# Patient Record
Sex: Female | Born: 1940 | Race: White | Hispanic: No | Marital: Married | Smoking: Former smoker
Health system: Southern US, Community
[De-identification: ages and names within clinical notes are randomized; demographics above are authoritative.]

## PROBLEM LIST (undated history)

## (undated) DIAGNOSIS — I5189 Other ill-defined heart diseases: Secondary | ICD-10-CM

## (undated) DIAGNOSIS — K2970 Gastritis, unspecified, without bleeding: Secondary | ICD-10-CM

## (undated) DIAGNOSIS — F32A Depression, unspecified: Secondary | ICD-10-CM

## (undated) DIAGNOSIS — K559 Vascular disorder of intestine, unspecified: Secondary | ICD-10-CM

## (undated) DIAGNOSIS — F419 Anxiety disorder, unspecified: Secondary | ICD-10-CM

## (undated) DIAGNOSIS — K5790 Diverticulosis of intestine, part unspecified, without perforation or abscess without bleeding: Secondary | ICD-10-CM

## (undated) DIAGNOSIS — R51 Headache: Secondary | ICD-10-CM

## (undated) DIAGNOSIS — E039 Hypothyroidism, unspecified: Secondary | ICD-10-CM

## (undated) DIAGNOSIS — E559 Vitamin D deficiency, unspecified: Secondary | ICD-10-CM

## (undated) DIAGNOSIS — I10 Essential (primary) hypertension: Secondary | ICD-10-CM

## (undated) DIAGNOSIS — F329 Major depressive disorder, single episode, unspecified: Secondary | ICD-10-CM

## (undated) DIAGNOSIS — K219 Gastro-esophageal reflux disease without esophagitis: Secondary | ICD-10-CM

## (undated) DIAGNOSIS — G8929 Other chronic pain: Secondary | ICD-10-CM

## (undated) DIAGNOSIS — J45909 Unspecified asthma, uncomplicated: Secondary | ICD-10-CM

## (undated) DIAGNOSIS — I499 Cardiac arrhythmia, unspecified: Secondary | ICD-10-CM

## (undated) DIAGNOSIS — E785 Hyperlipidemia, unspecified: Secondary | ICD-10-CM

## (undated) DIAGNOSIS — R351 Nocturia: Secondary | ICD-10-CM

## (undated) DIAGNOSIS — E669 Obesity, unspecified: Secondary | ICD-10-CM

## (undated) DIAGNOSIS — M797 Fibromyalgia: Secondary | ICD-10-CM

## (undated) DIAGNOSIS — M549 Dorsalgia, unspecified: Secondary | ICD-10-CM

## (undated) DIAGNOSIS — M1990 Unspecified osteoarthritis, unspecified site: Secondary | ICD-10-CM

## (undated) DIAGNOSIS — M2550 Pain in unspecified joint: Secondary | ICD-10-CM

## (undated) HISTORY — DX: Anxiety disorder, unspecified: F41.9

## (undated) HISTORY — PX: CHOLECYSTECTOMY: SHX55

## (undated) HISTORY — PX: APPENDECTOMY: SHX54

## (undated) HISTORY — PX: FOOT SURGERY: SHX648

## (undated) HISTORY — PX: CORONARY ANGIOPLASTY: SHX604

## (undated) HISTORY — PX: OTHER SURGICAL HISTORY: SHX169

## (undated) HISTORY — PX: OOPHORECTOMY: SHX86

## (undated) HISTORY — PX: COLONOSCOPY: SHX174

## (undated) HISTORY — PX: ABDOMINAL HYSTERECTOMY: SHX81

## (undated) HISTORY — PX: BACK SURGERY: SHX140

---

## 1999-05-18 ENCOUNTER — Ambulatory Visit (HOSPITAL_COMMUNITY): Admission: RE | Admit: 1999-05-18 | Discharge: 1999-05-18 | Payer: Self-pay | Admitting: Neurosurgery

## 1999-05-18 ENCOUNTER — Encounter: Payer: Self-pay | Admitting: Neurosurgery

## 1999-06-20 ENCOUNTER — Encounter: Payer: Self-pay | Admitting: Neurosurgery

## 1999-06-20 ENCOUNTER — Ambulatory Visit (HOSPITAL_COMMUNITY): Admission: RE | Admit: 1999-06-20 | Discharge: 1999-06-20 | Payer: Self-pay

## 1999-07-11 ENCOUNTER — Encounter: Payer: Self-pay | Admitting: Neurosurgery

## 1999-07-11 ENCOUNTER — Ambulatory Visit (HOSPITAL_COMMUNITY): Admission: RE | Admit: 1999-07-11 | Discharge: 1999-07-12 | Payer: Self-pay | Admitting: Neurosurgery

## 2000-06-10 ENCOUNTER — Ambulatory Visit (HOSPITAL_COMMUNITY): Admission: RE | Admit: 2000-06-10 | Discharge: 2000-06-10 | Payer: Self-pay | Admitting: Internal Medicine

## 2000-06-20 ENCOUNTER — Ambulatory Visit (HOSPITAL_COMMUNITY): Admission: RE | Admit: 2000-06-20 | Discharge: 2000-06-20 | Payer: Self-pay | Admitting: Internal Medicine

## 2000-06-20 ENCOUNTER — Encounter (INDEPENDENT_AMBULATORY_CARE_PROVIDER_SITE_OTHER): Payer: Self-pay | Admitting: Internal Medicine

## 2000-06-25 ENCOUNTER — Ambulatory Visit (HOSPITAL_COMMUNITY): Admission: RE | Admit: 2000-06-25 | Discharge: 2000-06-25 | Payer: Self-pay | Admitting: Otolaryngology

## 2000-06-25 ENCOUNTER — Encounter: Payer: Self-pay | Admitting: Otolaryngology

## 2001-03-18 ENCOUNTER — Encounter: Payer: Self-pay | Admitting: Family Medicine

## 2001-03-18 ENCOUNTER — Inpatient Hospital Stay (HOSPITAL_COMMUNITY): Admission: AD | Admit: 2001-03-18 | Discharge: 2001-03-20 | Payer: Self-pay | Admitting: Family Medicine

## 2001-04-02 ENCOUNTER — Encounter: Payer: Self-pay | Admitting: Cardiology

## 2001-04-02 ENCOUNTER — Inpatient Hospital Stay (HOSPITAL_COMMUNITY): Admission: EM | Admit: 2001-04-02 | Discharge: 2001-04-03 | Payer: Self-pay

## 2001-12-07 ENCOUNTER — Ambulatory Visit (HOSPITAL_COMMUNITY): Admission: RE | Admit: 2001-12-07 | Discharge: 2001-12-07 | Payer: Self-pay | Admitting: *Deleted

## 2002-01-17 ENCOUNTER — Encounter: Payer: Self-pay | Admitting: *Deleted

## 2002-01-17 ENCOUNTER — Emergency Department (HOSPITAL_COMMUNITY): Admission: EM | Admit: 2002-01-17 | Discharge: 2002-01-17 | Payer: Self-pay | Admitting: *Deleted

## 2002-04-12 ENCOUNTER — Encounter: Payer: Self-pay | Admitting: Family Medicine

## 2002-04-12 ENCOUNTER — Ambulatory Visit (HOSPITAL_COMMUNITY): Admission: RE | Admit: 2002-04-12 | Discharge: 2002-04-12 | Payer: Self-pay | Admitting: Family Medicine

## 2004-02-14 ENCOUNTER — Ambulatory Visit: Payer: Self-pay | Admitting: *Deleted

## 2004-09-24 ENCOUNTER — Inpatient Hospital Stay (HOSPITAL_COMMUNITY): Admission: EM | Admit: 2004-09-24 | Discharge: 2004-09-25 | Payer: Self-pay | Admitting: *Deleted

## 2005-01-08 ENCOUNTER — Emergency Department (HOSPITAL_COMMUNITY): Admission: EM | Admit: 2005-01-08 | Discharge: 2005-01-08 | Payer: Self-pay | Admitting: Emergency Medicine

## 2005-04-08 ENCOUNTER — Inpatient Hospital Stay (HOSPITAL_COMMUNITY): Admission: EM | Admit: 2005-04-08 | Discharge: 2005-04-15 | Payer: Self-pay | Admitting: Emergency Medicine

## 2005-04-10 ENCOUNTER — Ambulatory Visit: Payer: Self-pay | Admitting: Gastroenterology

## 2005-04-20 ENCOUNTER — Emergency Department (HOSPITAL_COMMUNITY): Admission: EM | Admit: 2005-04-20 | Discharge: 2005-04-20 | Payer: Self-pay | Admitting: Emergency Medicine

## 2005-05-01 ENCOUNTER — Ambulatory Visit: Payer: Self-pay | Admitting: Internal Medicine

## 2005-07-02 ENCOUNTER — Ambulatory Visit: Payer: Self-pay | Admitting: *Deleted

## 2005-11-01 ENCOUNTER — Ambulatory Visit (HOSPITAL_COMMUNITY): Admission: RE | Admit: 2005-11-01 | Discharge: 2005-11-01 | Payer: Self-pay

## 2006-01-03 ENCOUNTER — Ambulatory Visit (HOSPITAL_COMMUNITY): Admission: RE | Admit: 2006-01-03 | Discharge: 2006-01-03 | Payer: Self-pay | Admitting: Neurological Surgery

## 2006-01-27 ENCOUNTER — Inpatient Hospital Stay (HOSPITAL_COMMUNITY): Admission: RE | Admit: 2006-01-27 | Discharge: 2006-01-31 | Payer: Self-pay | Admitting: Neurological Surgery

## 2006-06-11 ENCOUNTER — Ambulatory Visit (HOSPITAL_COMMUNITY): Admission: RE | Admit: 2006-06-11 | Discharge: 2006-06-11 | Payer: Self-pay | Admitting: Neurological Surgery

## 2006-07-08 ENCOUNTER — Ambulatory Visit: Payer: Self-pay | Admitting: *Deleted

## 2007-03-10 ENCOUNTER — Ambulatory Visit: Payer: Self-pay | Admitting: Gastroenterology

## 2007-08-11 ENCOUNTER — Ambulatory Visit: Payer: Self-pay | Admitting: *Deleted

## 2008-02-02 ENCOUNTER — Ambulatory Visit: Payer: Self-pay | Admitting: Family Medicine

## 2008-05-26 ENCOUNTER — Ambulatory Visit: Payer: Self-pay | Admitting: Family Medicine

## 2008-08-12 ENCOUNTER — Ambulatory Visit: Payer: Self-pay | Admitting: Family Medicine

## 2008-09-30 ENCOUNTER — Other Ambulatory Visit: Payer: Self-pay | Admitting: Internal Medicine

## 2008-10-03 ENCOUNTER — Ambulatory Visit: Payer: Self-pay | Admitting: Internal Medicine

## 2009-06-16 ENCOUNTER — Emergency Department: Payer: Self-pay | Admitting: Emergency Medicine

## 2009-08-15 ENCOUNTER — Ambulatory Visit: Payer: Self-pay | Admitting: Family Medicine

## 2009-08-18 ENCOUNTER — Ambulatory Visit: Payer: Self-pay | Admitting: Family Medicine

## 2010-01-29 ENCOUNTER — Ambulatory Visit: Payer: Self-pay | Admitting: Pain Medicine

## 2010-02-13 ENCOUNTER — Ambulatory Visit: Payer: Self-pay | Admitting: Pain Medicine

## 2010-03-07 ENCOUNTER — Ambulatory Visit: Payer: Self-pay | Admitting: Pain Medicine

## 2010-03-17 ENCOUNTER — Encounter: Payer: Self-pay | Admitting: Family Medicine

## 2010-03-18 ENCOUNTER — Encounter: Payer: Self-pay | Admitting: Physical Medicine and Rehabilitation

## 2010-04-12 ENCOUNTER — Other Ambulatory Visit: Payer: Self-pay | Admitting: Internal Medicine

## 2010-04-13 ENCOUNTER — Ambulatory Visit: Payer: Self-pay | Admitting: Internal Medicine

## 2010-04-13 HISTORY — PX: CARDIAC CATHETERIZATION: SHX172

## 2010-05-21 ENCOUNTER — Emergency Department (HOSPITAL_COMMUNITY): Payer: Medicare PPO

## 2010-05-21 ENCOUNTER — Emergency Department (HOSPITAL_COMMUNITY)
Admission: EM | Admit: 2010-05-21 | Discharge: 2010-05-21 | Disposition: A | Discharge status: Home / Self Care | Payer: Medicare PPO | Attending: Emergency Medicine | Admitting: Emergency Medicine

## 2010-05-21 DIAGNOSIS — I10 Essential (primary) hypertension: Secondary | ICD-10-CM | POA: Insufficient documentation

## 2010-05-21 DIAGNOSIS — R0602 Shortness of breath: Secondary | ICD-10-CM | POA: Insufficient documentation

## 2010-05-21 DIAGNOSIS — R5381 Other malaise: Secondary | ICD-10-CM | POA: Insufficient documentation

## 2010-05-21 DIAGNOSIS — R51 Headache: Secondary | ICD-10-CM | POA: Insufficient documentation

## 2010-05-21 DIAGNOSIS — E039 Hypothyroidism, unspecified: Secondary | ICD-10-CM | POA: Insufficient documentation

## 2010-05-21 DIAGNOSIS — R079 Chest pain, unspecified: Secondary | ICD-10-CM | POA: Insufficient documentation

## 2010-05-21 DIAGNOSIS — K219 Gastro-esophageal reflux disease without esophagitis: Secondary | ICD-10-CM | POA: Insufficient documentation

## 2010-05-21 DIAGNOSIS — Z79899 Other long term (current) drug therapy: Secondary | ICD-10-CM | POA: Insufficient documentation

## 2010-05-21 DIAGNOSIS — R5383 Other fatigue: Secondary | ICD-10-CM | POA: Insufficient documentation

## 2010-05-21 LAB — COMPREHENSIVE METABOLIC PANEL
ALT: 17 U/L (ref 0–35)
CO2: 25 mEq/L (ref 19–32)
GFR calc Af Amer: >60 mL/min (ref >60)
Potassium: 3.9 mEq/L (ref 3.5–5.1)
Total Bilirubin: 0.7 mg/dL (ref 0.3–1.2)

## 2010-05-21 LAB — DIFFERENTIAL
Basophils Absolute: 0 10*3/uL (ref 0.0–0.1)
Basophils Relative: 0 % (ref 0–1)
Eosinophils Absolute: 0.1 10*3/uL (ref 0.0–0.7)
Lymphocytes Relative: 44 % (ref 12–46)
Lymphs Abs: 2.3 10*3/uL (ref 0.7–4.0)
Monocytes Absolute: 0.5 10*3/uL (ref 0.1–1.0)
Monocytes Relative: 10 % (ref 3–12)

## 2010-05-21 LAB — POCT CARDIAC MARKERS: CKMB, poc: <1 ng/mL — ABNORMAL LOW (ref 1.0–8.0)

## 2010-05-21 LAB — CBC
HCT: 37.8 % (ref 36.0–46.0)
MCHC: 33.3 g/dL (ref 30.0–36.0)
RDW: 14.2 % (ref 11.5–15.5)

## 2010-05-21 LAB — D-DIMER, QUANTITATIVE: D-Dimer, Quant: 0.63 ug/mL-FEU — ABNORMAL HIGH (ref 0.00–0.48)

## 2010-05-21 MED ORDER — IOHEXOL 350 MG/ML SOLN
100.0000 mL | Freq: Once | INTRAVENOUS | Status: AC | PRN
  Administered 2010-05-21: 100 mL via INTRAVENOUS

## 2010-05-21 MED ORDER — IOHEXOL 350 MG/ML SOLN
100.0000 mL | Freq: Once | INTRAVENOUS | Status: AC | PRN
  Administered 2010-05-21: 20:00:00 via INTRAVENOUS

## 2010-08-21 ENCOUNTER — Ambulatory Visit: Payer: Self-pay | Admitting: Family Medicine

## 2010-12-25 ENCOUNTER — Emergency Department: Payer: Self-pay | Admitting: Emergency Medicine

## 2011-04-05 ENCOUNTER — Ambulatory Visit: Payer: Self-pay | Admitting: Gastroenterology

## 2011-05-02 ENCOUNTER — Emergency Department (HOSPITAL_COMMUNITY): Payer: Medicare Other

## 2011-05-02 ENCOUNTER — Encounter (HOSPITAL_COMMUNITY): Payer: Self-pay | Admitting: *Deleted

## 2011-05-02 ENCOUNTER — Observation Stay (HOSPITAL_COMMUNITY)
Admission: EM | Admit: 2011-05-02 | Discharge: 2011-05-04 | Disposition: A | Discharge status: Home / Self Care | Payer: Medicare Other | Attending: Internal Medicine | Admitting: Internal Medicine

## 2011-05-02 DIAGNOSIS — K5289 Other specified noninfective gastroenteritis and colitis: Principal | ICD-10-CM | POA: Insufficient documentation

## 2011-05-02 DIAGNOSIS — E860 Dehydration: Secondary | ICD-10-CM | POA: Insufficient documentation

## 2011-05-02 DIAGNOSIS — R109 Unspecified abdominal pain: Secondary | ICD-10-CM

## 2011-05-02 DIAGNOSIS — R197 Diarrhea, unspecified: Secondary | ICD-10-CM | POA: Diagnosis present

## 2011-05-02 DIAGNOSIS — R531 Weakness: Secondary | ICD-10-CM | POA: Diagnosis present

## 2011-05-02 DIAGNOSIS — E039 Hypothyroidism, unspecified: Secondary | ICD-10-CM | POA: Insufficient documentation

## 2011-05-02 DIAGNOSIS — K219 Gastro-esophageal reflux disease without esophagitis: Secondary | ICD-10-CM | POA: Insufficient documentation

## 2011-05-02 DIAGNOSIS — M6281 Muscle weakness (generalized): Secondary | ICD-10-CM | POA: Insufficient documentation

## 2011-05-02 DIAGNOSIS — I10 Essential (primary) hypertension: Secondary | ICD-10-CM | POA: Insufficient documentation

## 2011-05-02 HISTORY — DX: Other chronic pain: G89.29

## 2011-05-02 HISTORY — DX: Essential (primary) hypertension: I10

## 2011-05-02 HISTORY — DX: Dorsalgia, unspecified: M54.9

## 2011-05-02 HISTORY — DX: Gastro-esophageal reflux disease without esophagitis: K21.9

## 2011-05-02 LAB — CBC
HCT: 45 % (ref 36.0–46.0)
Hemoglobin: 15.3 g/dL — ABNORMAL HIGH (ref 12.0–15.0)
MCHC: 34 g/dL (ref 30.0–36.0)
RBC: 4.95 MIL/uL (ref 3.87–5.11)

## 2011-05-02 LAB — URINALYSIS, ROUTINE W REFLEX MICROSCOPIC
Glucose, UA: NEGATIVE mg/dL
Hgb urine dipstick: NEGATIVE
Leukocytes, UA: NEGATIVE
Specific Gravity, Urine: 1.005 (ref 1.005–1.030)
pH: 5.5 (ref 5.0–8.0)

## 2011-05-02 LAB — COMPREHENSIVE METABOLIC PANEL
ALT: 20 U/L (ref 0–35)
Calcium: 10.7 mg/dL — ABNORMAL HIGH (ref 8.4–10.5)
Creatinine, Ser: 1.1 mg/dL (ref 0.50–1.10)
GFR calc Af Amer: 58 mL/min — ABNORMAL LOW (ref >90)
Glucose, Bld: 116 mg/dL — ABNORMAL HIGH (ref 70–99)
Sodium: 137 mEq/L (ref 135–145)
Total Protein: 7.8 g/dL (ref 6.0–8.3)

## 2011-05-02 LAB — LIPASE, BLOOD: Lipase: 41 U/L (ref 11–59)

## 2011-05-02 MED ORDER — ONDANSETRON HCL 4 MG PO TABS: 4.0000 mg | ORAL_TABLET | Freq: Four times a day (QID) | ORAL | Status: DC | PRN

## 2011-05-02 MED ORDER — IOHEXOL 300 MG/ML  SOLN
100.0000 mL | Freq: Once | INTRAMUSCULAR | Status: AC | PRN
  Administered 2011-05-02: 100 mL via INTRAVENOUS

## 2011-05-02 MED ORDER — ENOXAPARIN SODIUM 40 MG/0.4ML ~~LOC~~ SOLN
40.0000 mg | SUBCUTANEOUS | Status: DC
  Administered 2011-05-02: 40 mg via SUBCUTANEOUS
  Filled 2011-05-02: qty 0.4

## 2011-05-02 MED ORDER — SODIUM CHLORIDE 0.9 % IV SOLN
INTRAVENOUS | Status: DC
  Administered 2011-05-02 – 2011-05-03 (×2): via INTRAVENOUS

## 2011-05-02 MED ORDER — ACETAMINOPHEN 325 MG PO TABS
650.0000 mg | ORAL_TABLET | Freq: Four times a day (QID) | ORAL | Status: DC | PRN
  Administered 2011-05-02 – 2011-05-03 (×2): 650 mg via ORAL
  Filled 2011-05-02 (×2): qty 2

## 2011-05-02 MED ORDER — PANTOPRAZOLE SODIUM 40 MG PO TBEC: 80.0000 mg | DELAYED_RELEASE_TABLET | Freq: Every day | ORAL | Status: DC

## 2011-05-02 MED ORDER — LISINOPRIL 10 MG PO TABS
40.0000 mg | ORAL_TABLET | Freq: Every day | ORAL | Status: DC
  Administered 2011-05-03 – 2011-05-04 (×2): 40 mg via ORAL
  Filled 2011-05-02: qty 1
  Filled 2011-05-02: qty 4

## 2011-05-02 MED ORDER — ONDANSETRON HCL 4 MG/2ML IJ SOLN
4.0000 mg | Freq: Once | INTRAMUSCULAR | Status: AC
  Administered 2011-05-02: 4 mg via INTRAVENOUS
  Filled 2011-05-02: qty 2

## 2011-05-02 MED ORDER — SODIUM CHLORIDE 0.9 % IV BOLUS (SEPSIS)
500.0000 mL | Freq: Once | INTRAVENOUS | Status: AC
  Administered 2011-05-02: 1000 mL via INTRAVENOUS

## 2011-05-02 MED ORDER — PANTOPRAZOLE SODIUM 40 MG PO TBEC
80.0000 mg | DELAYED_RELEASE_TABLET | Freq: Every day | ORAL | Status: DC
  Administered 2011-05-03 – 2011-05-04 (×2): 80 mg via ORAL
  Filled 2011-05-02: qty 2
  Filled 2011-05-02: qty 1

## 2011-05-02 MED ORDER — LEVOTHYROXINE SODIUM 88 MCG PO TABS
88.0000 ug | ORAL_TABLET | Freq: Every day | ORAL | Status: DC
  Administered 2011-05-03 – 2011-05-04 (×2): 88 ug via ORAL
  Filled 2011-05-02 (×4): qty 1

## 2011-05-02 MED ORDER — ONDANSETRON HCL 4 MG/2ML IJ SOLN
4.0000 mg | Freq: Four times a day (QID) | INTRAMUSCULAR | Status: DC | PRN
  Administered 2011-05-03: 4 mg via INTRAVENOUS
  Filled 2011-05-02: qty 2

## 2011-05-02 MED ORDER — TRAZODONE HCL 50 MG PO TABS
25.0000 mg | ORAL_TABLET | Freq: Every day | ORAL | Status: DC
  Administered 2011-05-02 – 2011-05-03 (×2): 25 mg via ORAL
  Filled 2011-05-02 (×2): qty 1

## 2011-05-02 MED ORDER — SODIUM CHLORIDE 0.9 % IV SOLN: INTRAVENOUS | Status: DC

## 2011-05-02 MED ORDER — TRAZODONE HCL 50 MG PO TABS: 25.0000 mg | ORAL_TABLET | Freq: Every day | ORAL | Status: DC

## 2011-05-02 MED ORDER — ACETAMINOPHEN 650 MG RE SUPP: 650.0000 mg | Freq: Four times a day (QID) | RECTAL | Status: DC | PRN

## 2011-05-02 MED ORDER — HYDROCODONE-ACETAMINOPHEN 5-325 MG PO TABS: 1.0000 | ORAL_TABLET | ORAL | Status: DC | PRN

## 2011-05-02 NOTE — ED Provider Notes (Addendum)
History    Scribed for Shelda Jakes, MD, the patient was seen in room APA10/APA10. This chart was scribed by Katha Cabal.   CSN: [id number]  Arrival date & time 05/02/11  0902   First MD Initiated Contact with Patient 05/02/11 [phone]      Chief Complaint  Patient presents with  . Abdominal Pain    (Consider location/radiation/quality/duration/timing/severity/associated sxs/prior treatment) HPI  Pt was seen at 9:55 AM   Madison Jennings is a 71 y.o. female who presents to the Emergency Department complaining of persistent right sided abdomianl pain with associated nausea and diarrhea for past 10 days.  Pain described as sharp. Pain radiates to left abdomen at times but does not radiate to back.  Pain was constant for a week and now pain is intermittent.  No pain currently.   Patient had 3-4 episodes of diarrhea yesterday which was not bloody.  Patient reports shortness of breath over past week,  headache, sinus pressure and cough.  Symptoms are not associated with vomiting, sore throat, dysuria, rash, new back pain or leg swelling.  Patient with history of chronic back pain, GERD, hypertension and thyroid disease.     PCP Dr. Mayford Knife [facility])    Past Medical History  Diagnosis Date  . Chronic back pain   . Hypertension   . Thyroid disease   . GERD (gastroesophageal reflux disease)     Past Surgical History  Procedure Date  . Back surgery   . Abdominal hysterectomy   . Cholecystectomy     No family history on file.  History  Substance Use Topics  . Smoking status: Never Smoker   . Smokeless tobacco: Not on file  . Alcohol Use: No    OB History    Grav Para Term Preterm Abortions TAB SAB Ect Mult Living                  Review of Systems  All other systems reviewed and are negative.   Remaining review of systems negative except as noted in the HPI.   Allergies  Review of patient's allergies indicates no known allergies.  Home Medications    Current Outpatient Rx  Name Route Sig Dispense Refill  . ACETAMINOPHEN 500 MG PO TABS Oral Take 500 mg by mouth every 6 (six) hours as needed. Pain    . ESOMEPRAZOLE MAGNESIUM 40 MG PO CPDR Oral Take 40 mg by mouth daily before breakfast.    . LEVOTHYROXINE SODIUM 88 MCG PO TABS Oral Take 88 mcg by mouth daily.    Marland Kitchen LISINOPRIL 40 MG PO TABS Oral Take 40 mg by mouth daily.      BP 122/58  Pulse 77  Temp(Src) 97.4 F (36.3 C) (Oral)  Resp 20  Ht 5\' 5"  (1.651 m)  Wt 230 lb (104.327 kg)  BMI 38.27 kg/m2  SpO2 98%  Physical Exam  Nursing note and vitals reviewed. Constitutional: She is oriented to person, place, and time. She appears well-developed.  HENT:  Head: Normocephalic and atraumatic.  Mouth/Throat: Mucous membranes are normal. Mucous membranes are not dry.  Eyes: Conjunctivae and EOM are normal.  Neck: Neck supple.  Cardiovascular: Normal rate and regular rhythm.   No murmur heard. Pulmonary/Chest: Effort normal and breath sounds normal. No respiratory distress.  Abdominal: Soft. Bowel sounds are normal. There is no tenderness. There is no rebound and no guarding.  Musculoskeletal: Normal range of motion. She exhibits no edema.       No  peripheral edema   Neurological: She is alert and oriented to person, place, and time. No cranial nerve deficit or sensory deficit.  Skin: Skin is warm and dry. No rash noted.  Psychiatric: Her behavior is normal.    ED Course  Procedures (including critical care time)   DIAGNOSTIC STUDIES: Oxygen Saturation is 97% on room air, normal by my interpretation.     COORDINATION OF CARE:   9:59 AM  Physical exam complete.  Will CT abdomen and order labs. 10:15 AM  Zofran and IV fluids.   2:36 PM  Consult with Hospitalist regarding patient case.  Plan to admit patient.  Family and patient agree with plan.      LABS / RADIOLOGY:   Labs Reviewed  COMPREHENSIVE METABOLIC PANEL - Abnormal; Notable for the following:    Glucose,  Bld 116 (*)    Calcium 10.7 (*)    Alkaline Phosphatase 128 (*)    GFR calc non Af Amer 50 (*)    GFR calc Af Amer 58 (*)    All other components within normal limits  CBC - Abnormal; Notable for the following:    WBC 10.8 (*)    Hemoglobin 15.3 (*)    All other components within normal limits  URINALYSIS, ROUTINE W REFLEX MICROSCOPIC  LIPASE, BLOOD   Results for orders placed during the hospital encounter of 05/02/11  COMPREHENSIVE METABOLIC PANEL      Component Value Range   Sodium 137  135 - 145 (mEq/L)   Potassium 3.6  3.5 - 5.1 (mEq/L)   Chloride 102  96 - 112 (mEq/L)   CO2 23  19 - 32 (mEq/L)   Glucose, Bld 116 (*) 70 - 99 (mg/dL)   BUN 21  6 - 23 (mg/dL)   Creatinine, Ser 1.61  0.50 - 1.10 (mg/dL)   Calcium 09.6 (*) 8.4 - 10.5 (mg/dL)   Total Protein 7.8  6.0 - 8.3 (g/dL)   Albumin 4.0  3.5 - 5.2 (g/dL)   AST 13  0 - 37 (U/L)   ALT 20  0 - 35 (U/L)   Alkaline Phosphatase 128 (*) 39 - 117 (U/L)   Total Bilirubin 0.5  0.3 - 1.2 (mg/dL)   GFR calc non Af Amer 50 (*) >90 (mL/min)   GFR calc Af Amer 58 (*) >90 (mL/min)  CBC      Component Value Range   WBC 10.8 (*) 4.0 - 10.5 (K/uL)   RBC 4.95  3.87 - 5.11 (MIL/uL)   Hemoglobin 15.3 (*) 12.0 - 15.0 (g/dL)   HCT 04.5  40.9 - 81.1 (%)   MCV 90.9  78.0 - 100.0 (fL)   MCH 30.9  26.0 - 34.0 (pg)   MCHC 34.0  30.0 - 36.0 (g/dL)   RDW 91.4  78.2 - 95.6 (%)   Platelets 242  150 - 400 (K/uL)  URINALYSIS, ROUTINE W REFLEX MICROSCOPIC      Component Value Range   Color, Urine YELLOW  YELLOW    APPearance CLEAR  CLEAR    Specific Gravity, Urine 1.005  1.005 - 1.030    pH 5.5  5.0 - 8.0    Glucose, UA NEGATIVE  NEGATIVE (mg/dL)   Hgb urine dipstick NEGATIVE  NEGATIVE    Bilirubin Urine NEGATIVE  NEGATIVE    Ketones, ur NEGATIVE  NEGATIVE (mg/dL)   Protein, ur NEGATIVE  NEGATIVE (mg/dL)   Urobilinogen, UA 0.2  0.0 - 1.0 (mg/dL)   Nitrite NEGATIVE  NEGATIVE  Leukocytes, UA NEGATIVE  NEGATIVE   LIPASE, BLOOD       Component Value Range   Lipase 41  11 - 59 (U/L)    Ct Abdomen Pelvis W Contrast  05/02/2011  *RADIOLOGY REPORT*  Clinical Data: Abdominal pain, nausea and vomiting.  History of appendectomy and cholecystectomy.  CT ABDOMEN AND PELVIS WITH CONTRAST  Technique:  Multidetector CT imaging of the abdomen and pelvis was performed following the standard protocol during bolus administration of intravenous contrast.  Contrast: OMNIPAQUE IOHEXOL 300 MG/ML IJ SOLN  Comparison: CT abdomen and pelvis dated 04/20/2005.  Findings:  Lung Bases: Dependent atelectasis is noted in the lower lobes of the lungs bilaterally (right greater than left).  Abdomen/Pelvis:  Status post cholecystectomy.  Pneumobilia is again noted (unchanged).  The enhanced appearance of the liver is otherwise normal.  The enhanced appearance of the pancreas, spleen and bilateral adrenal glands is unremarkable.  There is atrophy of the left kidney (similar to prior), and there are multifocal areas of mild parenchymal thinning in the right kidney, likely reflect areas of scarring.  Otherwise, there is no focal right renal lesion.  No ascites or pneumoperitoneum. There are a few isolated minimally dilated loops of small bowel (up to 3.4 cm in diameter) in the left upper quadrant of the abdomen.  However, the remainder of the small bowel is otherwise normal in caliber.  No definite pathologic adenopathy noted within the abdomen or pelvis.  The patient is status post total abdominal hysterectomy and bilateral salpingo- oophorectomy.  Urinary bladder is unremarkable in appearance.  Musculoskeletal: There are no aggressive appearing lytic or blastic lesions noted in the visualized portions of the skeleton.  Status post PLIF from L4-S1 with interbody grafts at L4-L5 and L5-S1.  IMPRESSION: 1.  While there is an isolated mildly dilated loop of small bowel in the left upper quadrant of the abdomen (likely jejunal), this is highly nonspecific and there are no  definitive signs to strongly suggest the presence of small bowel obstruction.  This may simply relate to transit of peristalsis. 2.  No other potentially acute findings in the abdomen or pelvis to account for the patient's symptoms. 3.  Status post cholecystectomy, appendectomy and total abdominal hysterectomy with bilateral salpingo-oophorectomy. 4.  Unchanged pneumobilia, suggestive of prior sphincterotomy in this patient. 5.  Left-sided renal atrophy with areas of scarring in the renal parenchyma bilaterally.  6.  Status post PLIF from L4-S1 with interbody grafts at L4-L5 and L5-S1.  Original Report Authenticated By: Florencia Reasons, M.D.         MDM  Patient with one-week history of bilateral lower quadrant abdominal pain workup in the emergency department without specific findings based on CT mild leukocytosis no significant mitral abnormalities urinalysis not consistent with UTI however patient not feeling well has had abdominal pain some nausea and diarrhea since Sunday occasional episodes of vomiting discussed with hospitalist and they will admit to hobs status and observe on the MedSurg floor. The one dilated loop of small bowel noted this can be watched to see if there is any progression. Patient definitely feeling poorly checking thyroid levels to be considered.        MEDICATIONS GIVEN IN THE E.D. Scheduled Meds:    . ondansetron  4 mg Intravenous Once  . sodium chloride  500 mL Intravenous Once   Continuous Infusions:    . sodium chloride         IMPRESSION: 1. Abdominal  pain, other specified  site        I personally performed the services described in this documentation, which was scribed in my presence. The recorded information has been reviewed and considered.        Shelda Jakes, MD 05/02/11 1442  Shelda Jakes, MD 05/05/11 2035

## 2011-05-02 NOTE — ED Notes (Signed)
Pt given warm blanket.

## 2011-05-02 NOTE — ED Notes (Signed)
Assisted with pt ambulation to the restroom at this time pt states she feels very weak with ambulation no pain or dizziness noted. Pt also states that she is freezing at this time blanket applied also urine obtained and sent to lab pt's v/s also taken . Domnic Vantol

## 2011-05-02 NOTE — ED Notes (Signed)
Pt c/o abdominal pain, nausea and diarrhea since 04/21/11. Pt states that the pain is intermittent and she has constant nausea. Pt states that she was taking something OTC for diarrhea and it stopped. Pt states that it started back yesterday. Pt also states that she has been taking narcotics for years and stopped taking her oxycodone and her neurontin on 04/21/11. Pt crying while talking. States that she feels weak and has been unable to eat.

## 2011-05-02 NOTE — ED Notes (Signed)
Report given to RN on floor...

## 2011-05-02 NOTE — ED Notes (Signed)
Abdominal pain, nausea, and diarrhea since Sunday before last, per pt.

## 2011-05-02 NOTE — H&P (Signed)
PCP:   No primary provider on file.   Chief Complaint:  weakness  HPI: This is a 71 y/o female who presents to the hospital with complaints of generalized weakness. Symptoms started approx 2 weeks ago.  She has had a difficult time getting around the house and doing her ADLs from her generalized weakness.  She has been having diarrhea yesterday.  Has had many stools, unable to quantify, none today.  No vomiting, but she does have nausea.  She has not had fevers, but feels very cold.  She has not had a significant cough, shortness of breath or chest pain.  She has abd pain in the lower quadrants.  No blood in stools. She was evaluated in the emergency room and was noted to be dehydrated.  She has been referred for admission.  Allergies:  No Known Allergies    Past Medical History  Diagnosis Date  . Chronic back pain   . Hypertension   . Thyroid disease   . GERD (gastroesophageal reflux disease)     Past Surgical History  Procedure Date  . Back surgery   . Abdominal hysterectomy   . Cholecystectomy     Prior to Admission medications   Medication Sig Start Date End Date Taking? Authorizing Provider  acetaminophen (TYLENOL) 500 MG tablet Take 500 mg by mouth every 6 (six) hours as needed. Pain   Yes Historical Provider, MD  esomeprazole (NEXIUM) 40 MG capsule Take 40 mg by mouth daily before breakfast.   Yes Historical Provider, MD  levothyroxine (SYNTHROID, LEVOTHROID) 88 MCG tablet Take 88 mcg by mouth daily.   Yes Historical Provider, MD  lisinopril (PRINIVIL,ZESTRIL) 40 MG tablet Take 40 mg by mouth daily.   Yes Historical Provider, MD    Social History:  reports that she has never smoked. She does not have any smokeless tobacco history on file. She reports that she does not drink alcohol. Her drug history not on file.  History reviewed. No pertinent family history.  Review of Systems: Positives in bold Constitutional: Denies fever, chills, diaphoresis, appetite change and  fatigue.  HEENT: Denies photophobia, eye pain, redness, hearing loss, ear pain, congestion, sore throat, rhinorrhea, sneezing, mouth sores, trouble swallowing, neck pain, neck stiffness and tinnitus.   Respiratory: Denies SOB, DOE, cough, chest tightness,  and wheezing.   Cardiovascular: Denies chest pain, palpitations and leg swelling.  Gastrointestinal: Denies nausea, vomiting, abdominal pain, diarrhea, constipation, blood in stool and abdominal distention.  Genitourinary: Denies dysuria, urgency, frequency, hematuria, flank pain and difficulty urinating.  Musculoskeletal: Denies myalgias, back pain, joint swelling, arthralgias and gait problem.  Skin: Denies pallor, rash and wound.  Neurological: Denies dizziness, seizures, syncope, weakness, light-headedness, numbness and headaches.  Hematological: Denies adenopathy. Easy bruising, personal or family bleeding history  Psychiatric/Behavioral: Denies suicidal ideation, mood changes, confusion, nervousness, sleep disturbance and agitation   Physical Exam: Blood pressure 122/44, pulse 68, temperature 98.8 F (37.1 C), temperature source Oral, resp. rate 20, height 5\' 5"  (1.651 m), weight 104.327 kg (230 lb), SpO2 95.00%. Gen: NAD, laying in bed, alert and oriented x3 HEENT: [place], AT, PERRLA Neck: supple Chest: CTA B Cardiac: s1, s2, RRR Abd: soft, nt, BS+ Ext: no edema, cyanosis or clubbing Neuro: grossly intact, non focal Skin: intact, no rashes  Labs on Admission:  Results for orders placed during the hospital encounter of 05/02/11 (from the past 48 hour(s))  COMPREHENSIVE METABOLIC PANEL     Status: Abnormal   Collection Time   05/02/11 10:08 AM  Component Value Range Comment   Sodium 137  135 - 145 (mEq/L)    Potassium 3.6  3.5 - 5.1 (mEq/L)    Chloride 102  96 - 112 (mEq/L)    CO2 23  19 - 32 (mEq/L)    Glucose, Bld 116 (*) 70 - 99 (mg/dL)    BUN 21  6 - 23 (mg/dL)    Creatinine, Ser 1.47  0.50 - 1.10 (mg/dL)    Calcium  82.9 (*) 8.4 - 10.5 (mg/dL)    Total Protein 7.8  6.0 - 8.3 (g/dL)    Albumin 4.0  3.5 - 5.2 (g/dL)    AST 13  0 - 37 (U/L)    ALT 20  0 - 35 (U/L)    Alkaline Phosphatase 128 (*) 39 - 117 (U/L)    Total Bilirubin 0.5  0.3 - 1.2 (mg/dL)    GFR calc non Af Amer 50 (*) >90 (mL/min)    GFR calc Af Amer 58 (*) >90 (mL/min)   CBC     Status: Abnormal   Collection Time   05/02/11 10:08 AM      Component Value Range Comment   WBC 10.8 (*) 4.0 - 10.5 (K/uL)    RBC 4.95  3.87 - 5.11 (MIL/uL)    Hemoglobin 15.3 (*) 12.0 - 15.0 (g/dL)    HCT 56.2  13.0 - 86.5 (%)    MCV 90.9  78.0 - 100.0 (fL)    MCH 30.9  26.0 - 34.0 (pg)    MCHC 34.0  30.0 - 36.0 (g/dL)    RDW 78.4  69.6 - 29.5 (%)    Platelets 242  150 - 400 (K/uL)   LIPASE, BLOOD     Status: Normal   Collection Time   05/02/11 10:08 AM      Component Value Range Comment   Lipase 41  11 - 59 (U/L)   URINALYSIS, ROUTINE W REFLEX MICROSCOPIC     Status: Normal   Collection Time   05/02/11 10:12 AM      Component Value Range Comment   Color, Urine YELLOW  YELLOW     APPearance CLEAR  CLEAR     Specific Gravity, Urine 1.005  1.005 - 1.030     pH 5.5  5.0 - 8.0     Glucose, UA NEGATIVE  NEGATIVE (mg/dL)    Hgb urine dipstick NEGATIVE  NEGATIVE     Bilirubin Urine NEGATIVE  NEGATIVE     Ketones, ur NEGATIVE  NEGATIVE (mg/dL)    Protein, ur NEGATIVE  NEGATIVE (mg/dL)    Urobilinogen, UA 0.2  0.0 - 1.0 (mg/dL)    Nitrite NEGATIVE  NEGATIVE     Leukocytes, UA NEGATIVE  NEGATIVE  MICROSCOPIC NOT DONE ON URINES WITH NEGATIVE PROTEIN, BLOOD, LEUKOCYTES, NITRITE, OR GLUCOSE <1000 mg/dL.    Radiological Exams on Admission: Ct Abdomen Pelvis W Contrast  05/02/2011  *RADIOLOGY REPORT*  Clinical Data: Abdominal pain, nausea and vomiting.  History of appendectomy and cholecystectomy.  CT ABDOMEN AND PELVIS WITH CONTRAST  Technique:  Multidetector CT imaging of the abdomen and pelvis was performed following the standard protocol during bolus  administration of intravenous contrast.  Contrast: OMNIPAQUE IOHEXOL 300 MG/ML IJ SOLN  Comparison: CT abdomen and pelvis dated 04/20/2005.  Findings:  Lung Bases: Dependent atelectasis is noted in the lower lobes of the lungs bilaterally (right greater than left).  Abdomen/Pelvis:  Status post cholecystectomy.  Pneumobilia is again noted (unchanged).  The enhanced appearance of  the liver is otherwise normal.  The enhanced appearance of the pancreas, spleen and bilateral adrenal glands is unremarkable.  There is atrophy of the left kidney (similar to prior), and there are multifocal areas of mild parenchymal thinning in the right kidney, likely reflect areas of scarring.  Otherwise, there is no focal right renal lesion.  No ascites or pneumoperitoneum. There are a few isolated minimally dilated loops of small bowel (up to 3.4 cm in diameter) in the left upper quadrant of the abdomen.  However, the remainder of the small bowel is otherwise normal in caliber.  No definite pathologic adenopathy noted within the abdomen or pelvis.  The patient is status post total abdominal hysterectomy and bilateral salpingo- oophorectomy.  Urinary bladder is unremarkable in appearance.  Musculoskeletal: There are no aggressive appearing lytic or blastic lesions noted in the visualized portions of the skeleton.  Status post PLIF from L4-S1 with interbody grafts at L4-L5 and L5-S1.  IMPRESSION: 1.  While there is an isolated mildly dilated loop of small bowel in the left upper quadrant of the abdomen (likely jejunal), this is highly nonspecific and there are no definitive signs to strongly suggest the presence of small bowel obstruction.  This may simply relate to transit of peristalsis. 2.  No other potentially acute findings in the abdomen or pelvis to account for the patient's symptoms. 3.  Status post cholecystectomy, appendectomy and total abdominal hysterectomy with bilateral salpingo-oophorectomy. 4.  Unchanged pneumobilia,  suggestive of prior sphincterotomy in this patient. 5.  Left-sided renal atrophy with areas of scarring in the renal parenchyma bilaterally.  6.  Status post PLIF from L4-S1 with interbody grafts at L4-L5 and L5-S1.  Original Report Authenticated By: Florencia Reasons, M.D.    Assessment/Plan Active Problems:  Diarrhea  Generalized weakness  Dehydration  HTN (hypertension)  Hypothyroidism  GERD (gastroesophageal reflux disease)  Plan:  Patient likely has a viral gastroenteritis.  Will give imodium prn.  She will be rehydrated with IVF.  We will start her on clear liquids and advance as tolerated.  She will receive antiemetics as needed.  Will also send TSH for generalized weakness.  Will get physical therapy consult, as her weakness may just be related to deconditioning.  No clear focus of infection.  No antibiotics for now.  Repeat labs in the morning  Patient requests full code.  Time Spent on Admission:  Lachlan Pelto [facility] Pager: [phone] 05/02/2011, 5:51 PM

## 2011-05-03 LAB — CBC
HCT: 38.9 % (ref 36.0–46.0)
MCHC: 33.9 g/dL (ref 30.0–36.0)
Platelets: 208 10*3/uL (ref 150–400)
RDW: 13.7 % (ref 11.5–15.5)
WBC: 6 10*3/uL (ref 4.0–10.5)

## 2011-05-03 LAB — TSH: TSH: 3.889 u[IU]/mL (ref 0.350–4.500)

## 2011-05-03 LAB — COMPREHENSIVE METABOLIC PANEL
AST: 16 U/L (ref 0–37)
Albumin: 3.3 g/dL — ABNORMAL LOW (ref 3.5–5.2)
Alkaline Phosphatase: 99 U/L (ref 39–117)
BUN: 15 mg/dL (ref 6–23)
Chloride: 104 mEq/L (ref 96–112)
Potassium: 4.5 mEq/L (ref 3.5–5.1)
Total Bilirubin: 0.5 mg/dL (ref 0.3–1.2)
Total Protein: 6.5 g/dL (ref 6.0–8.3)

## 2011-05-03 MED ORDER — ENOXAPARIN SODIUM 60 MG/0.6ML ~~LOC~~ SOLN
0.5000 mg/kg | Freq: Every day | SUBCUTANEOUS | Status: DC
  Administered 2011-05-03 – 2011-05-04 (×2): 50 mg via SUBCUTANEOUS
  Filled 2011-05-03 (×2): qty 0.6

## 2011-05-03 MED ORDER — OXYMETAZOLINE HCL 0.05 % NA SOLN
1.0000 | Freq: Two times a day (BID) | NASAL | Status: DC
  Administered 2011-05-03: 1 via NASAL
  Filled 2011-05-03: qty 15

## 2011-05-03 MED ORDER — CITALOPRAM HYDROBROMIDE 20 MG PO TABS
20.0000 mg | ORAL_TABLET | Freq: Every day | ORAL | Status: DC
  Administered 2011-05-03 – 2011-05-04 (×2): 20 mg via ORAL
  Filled 2011-05-03 (×2): qty 1

## 2011-05-03 MED ORDER — LORATADINE 10 MG PO TABS
10.0000 mg | ORAL_TABLET | Freq: Every day | ORAL | Status: DC
  Administered 2011-05-03 – 2011-05-04 (×2): 10 mg via ORAL
  Filled 2011-05-03 (×2): qty 1

## 2011-05-03 NOTE — Evaluation (Signed)
Physical Therapy Evaluation Patient Details Name: Madison Jennings MRN: [id number] DOB: [birth year] Today's Date: 05/03/2011  Problem List:  Patient Active Problem List  Diagnoses  . Diarrhea  . Generalized weakness  . Dehydration  . HTN (hypertension)  . Hypothyroidism  . GERD (gastroesophageal reflux disease)    Past Medical History:  Past Medical History  Diagnosis Date  . Chronic back pain   . Hypertension   . Thyroid disease   . GERD (gastroesophageal reflux disease)    Past Surgical History:  Past Surgical History  Procedure Date  . Back surgery   . Abdominal hysterectomy   . Cholecystectomy     PT Assessment/Plan/Recommendation PT Assessment Clinical Impression Statement: pt states that she is feeling much better and would feel even better if she could get up and start to move around...after working with her, it is evident that she is close to prior functional level...she should not have any problems transitioning to home PT Recommendation/Assessment: Patent does not need any further PT services No Skilled PT: Patient at baseline level of functioning;Patient is independent with all acitivity/mobility PT Recommendation Follow Up Recommendations: No PT follow up Equipment Recommended: None recommended by PT PT Goals     PT Evaluation Precautions/Restrictions  Precautions Required Braces or Orthoses: No Restrictions Weight Bearing Restrictions: No Prior Functioning  Home Living Lives With: Spouse Receives Help From: Family Type of Home: House Home Layout: One level Home Access: Stairs to enter Entrance Stairs-Rails: Right Entrance Stairs-Number of Steps: 3 Home Adaptive Equipment: Straight cane;Shower chair with back;Bedside commode/3-in-1 Prior Function Level of Independence: Independent with basic ADLs;Independent with homemaking with ambulation;Independent with gait;Independent with transfers Driving: Yes Vocation:  Retired Producer, television/film/video: Awake/alert Overall Cognitive Status: Appears within functional limits for tasks assessed Sensation/Coordination Sensation Light Touch: Appears Intact Stereognosis: Not tested Hot/Cold: Not tested Proprioception: Appears Intact Coordination Gross Motor Movements are Fluid and Coordinated: Yes Fine Motor Movements are Fluid and Coordinated: Yes Extremity Assessment RUE Assessment RUE Assessment: Within Functional Limits LUE Assessment LUE Assessment: Within Functional Limits RLE Assessment RLE Assessment: Within Functional Limits LLE Assessment LLE Assessment: Within Functional Limits Mobility (including Balance) Bed Mobility Bed Mobility: Yes Left Sidelying to Sit: 7: Independent Sitting - Scoot to Edge of Bed: 7: Independent Transfers Transfers: Yes Sit to Stand: 7: Independent Stand to Sit: 7: Independent Ambulation/Gait Ambulation/Gait: Yes Ambulation/Gait Assistance: 7: Independent Ambulation Distance (Feet): 150 Feet Assistive device: None Gait Pattern: Within Functional Limits Stairs: No Wheelchair Mobility Wheelchair Mobility: No  Posture/Postural Control Posture/Postural Control: No significant limitations Balance Balance Assessed:  (WNL by functional observation) Exercise    End of Session PT - End of Session Equipment Utilized During Treatment: Gait belt Activity Tolerance: Patient tolerated treatment well Patient left: in chair;with call bell in reach General Behavior During Session: [facility] for tasks performed Cognition: [facility] for tasks performed  Konrad Penta 05/03/2011, 2:34 PM

## 2011-05-03 NOTE — Progress Notes (Signed)
Patient stated she tolerated her meal well. Will continue to monitor.

## 2011-05-03 NOTE — Progress Notes (Signed)
UR Chart Review Completed  

## 2011-05-03 NOTE — Progress Notes (Signed)
Subjective: Feels a little better today, still has some nausea but no vomiting, no diarrhea, still feels weak  Objective: Vital signs in last 24 hours: Temp:  [97.7 F (36.5 C)-98.3 F (36.8 C)] 97.7 F (36.5 C) (03/08 1519) Pulse Rate:  [66-75] 75  (03/08 1519) Resp:  [18-20] 18  (03/08 1519) BP: (101-123)/(62-85) 123/85 mmHg (03/08 1519) SpO2:  [91 %-96 %] 93 % (03/08 1519) Weight change:  Last BM Date: 05/03/11  Intake/Output from previous day: 03/07 0701 - 03/08 0700 In: 925 [I.V.:925] Out: -  Total I/O In: 1030 [P.O.:1030] Out: 800 [Urine:800]   Physical Exam: General: Alert, awake, oriented x3, in no acute distress. HEENT: No bruits, no goiter. Heart: Regular rate and rhythm, without murmurs, rubs, gallops. Lungs: Clear to auscultation bilaterally. Abdomen: Soft, nontender, nondistended, positive bowel sounds. Extremities: No clubbing cyanosis or edema with positive pedal pulses. Neuro: Grossly intact, nonfocal.    Lab Results: Basic Metabolic Panel:  Basename 05/03/11 0438 05/02/11 1008  NA 138 137  K 4.5 3.6  CL 104 102  CO2 23 23  GLUCOSE 110* 116*  BUN 15 21  CREATININE 0.94 1.10  CALCIUM 9.6 10.7*  MG -- --  PHOS -- --   Liver Function Tests:  Basename 05/03/11 0438 05/02/11 1008  AST 16 13  ALT 15 20  ALKPHOS 99 128*  BILITOT 0.5 0.5  PROT 6.5 7.8  ALBUMIN 3.3* 4.0    Basename 05/02/11 1008  LIPASE 41  AMYLASE --   No results found for this basename: AMMONIA:2 in the last 72 hours CBC:  Basename 05/03/11 0438 05/02/11 1008  WBC 6.0 10.8*  NEUTROABS -- --  HGB 13.2 15.3*  HCT 38.9 45.0  MCV 90.5 90.9  PLT 208 242   Cardiac Enzymes: No results found for this basename: CKTOTAL:3,CKMB:3,CKMBINDEX:3,TROPONINI:3 in the last 72 hours BNP: No results found for this basename: PROBNP:3 in the last 72 hours D-Dimer: No results found for this basename: DDIMER:2 in the last 72 hours CBG: No results found for this basename: GLUCAP:6 in  the last 72 hours Hemoglobin A1C: No results found for this basename: HGBA1C in the last 72 hours Fasting Lipid Panel: No results found for this basename: CHOL,HDL,LDLCALC,TRIG,CHOLHDL,LDLDIRECT in the last 72 hours Thyroid Function Tests:  Basename 05/02/11 1008  TSH 3.889  T4TOTAL --  FREET4 --  T3FREE --  THYROIDAB --   Anemia Panel: No results found for this basename: VITAMINB12,FOLATE,FERRITIN,TIBC,IRON,RETICCTPCT in the last 72 hours Coagulation: No results found for this basename: LABPROT:2,INR:2 in the last 72 hours Urine Drug Screen: Drugs of Abuse  No results found for this basename: labopia, cocainscrnur, labbenz, amphetmu, thcu, labbarb    Alcohol Level: No results found for this basename: ETH:2 in the last 72 hours Urinalysis:  Basename 05/02/11 1012  COLORURINE YELLOW  LABSPEC 1.005  PHURINE 5.5  GLUCOSEU NEGATIVE  HGBUR NEGATIVE  BILIRUBINUR NEGATIVE  KETONESUR NEGATIVE  PROTEINUR NEGATIVE  UROBILINOGEN 0.2  NITRITE NEGATIVE  LEUKOCYTESUR NEGATIVE    No results found for this or any previous visit (from the past 240 hour(s)).  Studies/Results: Ct Abdomen Pelvis W Contrast  05/02/2011  *RADIOLOGY REPORT*  Clinical Data: Abdominal pain, nausea and vomiting.  History of appendectomy and cholecystectomy.  CT ABDOMEN AND PELVIS WITH CONTRAST  Technique:  Multidetector CT imaging of the abdomen and pelvis was performed following the standard protocol during bolus administration of intravenous contrast.  Contrast: OMNIPAQUE IOHEXOL 300 MG/ML IJ SOLN  Comparison: CT abdomen and pelvis dated  04/20/2005.  Findings:  Lung Bases: Dependent atelectasis is noted in the lower lobes of the lungs bilaterally (right greater than left).  Abdomen/Pelvis:  Status post cholecystectomy.  Pneumobilia is again noted (unchanged).  The enhanced appearance of the liver is otherwise normal.  The enhanced appearance of the pancreas, spleen and bilateral adrenal glands is  unremarkable.  There is atrophy of the left kidney (similar to prior), and there are multifocal areas of mild parenchymal thinning in the right kidney, likely reflect areas of scarring.  Otherwise, there is no focal right renal lesion.  No ascites or pneumoperitoneum. There are a few isolated minimally dilated loops of small bowel (up to 3.4 cm in diameter) in the left upper quadrant of the abdomen.  However, the remainder of the small bowel is otherwise normal in caliber.  No definite pathologic adenopathy noted within the abdomen or pelvis.  The patient is status post total abdominal hysterectomy and bilateral salpingo- oophorectomy.  Urinary bladder is unremarkable in appearance.  Musculoskeletal: There are no aggressive appearing lytic or blastic lesions noted in the visualized portions of the skeleton.  Status post PLIF from L4-S1 with interbody grafts at L4-L5 and L5-S1.  IMPRESSION: 1.  While there is an isolated mildly dilated loop of small bowel in the left upper quadrant of the abdomen (likely jejunal), this is highly nonspecific and there are no definitive signs to strongly suggest the presence of small bowel obstruction.  This may simply relate to transit of peristalsis. 2.  No other potentially acute findings in the abdomen or pelvis to account for the patient's symptoms. 3.  Status post cholecystectomy, appendectomy and total abdominal hysterectomy with bilateral salpingo-oophorectomy. 4.  Unchanged pneumobilia, suggestive of prior sphincterotomy in this patient. 5.  Left-sided renal atrophy with areas of scarring in the renal parenchyma bilaterally.  6.  Status post PLIF from L4-S1 with interbody grafts at L4-L5 and L5-S1.  Original Report Authenticated By: Florencia Reasons, M.D.    Medications: Scheduled Meds:   . enoxaparin (LOVENOX) injection  0.5 mg/kg Subcutaneous Daily  . levothyroxine  88 mcg Oral QAC breakfast  . lisinopril  40 mg Oral Daily  . pantoprazole  80 mg Oral Q1200  .  traZODone  25 mg Oral QHS  . DISCONTD: sodium chloride   Intravenous STAT  . DISCONTD: enoxaparin  40 mg Subcutaneous Q24H  . DISCONTD: pantoprazole  80 mg Oral Daily  . DISCONTD: traZODone  25 mg Oral QHS   Continuous Infusions:   . sodium chloride 100 mL/hr at 05/03/11 0818   PRN Meds:.acetaminophen, acetaminophen, HYDROcodone-acetaminophen, ondansetron (ZOFRAN) IV, ondansetron  Assessment/Plan:  Active Problems:  Diarrhea  Generalized weakness  Dehydration  HTN (hypertension)  Hypothyroidism  GERD (gastroesophageal reflux disease)  Plan:  1. Vomiting, Diarrhea:  Possibly related to gastroenteritis.  Appears to be improving. Advance diet to solid food today.  2. Generalized weakness. No further PT recommended.  TSH normal.  Patient does appear to be depressed.  I suspected that the majority of her complaints are related to depression.  She is willing to try an antidepressant.  Will start her on celexa.  3. Dehydration, improved with IVF  4. Hypothyroidsm, TSH normal  5. Dispo.  If tolerates diet then likely home in am.   LOS: 1 day   Tahtiana Rozier [facility] Pager: [phone] 05/03/2011, 4:06 PM

## 2011-05-04 NOTE — Discharge Summary (Signed)
Physician Discharge Summary  Patient ID: Madison Jennings MRN: [id number] DOB/AGE: Aug 21, 1940 71 y.o.  Admit date: 05/02/2011 Discharge date: 05/04/2011    Discharge Diagnoses:  1. Gastroenteritis likely viral. 2. Dehydration, clinically resolved. 3. Hypertension. 4. GERD. 5. Hypothyroidism.   Medication List  As of 05/04/2011 11:37 AM   TAKE these medications         acetaminophen 500 MG tablet   Commonly known as: TYLENOL   Take 500 mg by mouth every 6 (six) hours as needed. Pain      esomeprazole 40 MG capsule   Commonly known as: NEXIUM   Take 40 mg by mouth daily before breakfast.      levothyroxine 88 MCG tablet   Commonly known as: SYNTHROID, LEVOTHROID   Take 88 mcg by mouth daily.      lisinopril 40 MG tablet   Commonly known as: PRINIVIL,ZESTRIL   Take 40 mg by mouth daily.            Discharged Condition: Stable and improved. Tolerated diet.    Consults: None.  Significant Diagnostic Studies: Ct Abdomen Pelvis W Contrast  05/02/2011  *RADIOLOGY REPORT*  Clinical Data: Abdominal pain, nausea and vomiting.  History of appendectomy and cholecystectomy.  CT ABDOMEN AND PELVIS WITH CONTRAST  Technique:  Multidetector CT imaging of the abdomen and pelvis was performed following the standard protocol during bolus administration of intravenous contrast.  Contrast: OMNIPAQUE IOHEXOL 300 MG/ML IJ SOLN  Comparison: CT abdomen and pelvis dated 04/20/2005.  Findings:  Lung Bases: Dependent atelectasis is noted in the lower lobes of the lungs bilaterally (right greater than left).  Abdomen/Pelvis:  Status post cholecystectomy.  Pneumobilia is again noted (unchanged).  The enhanced appearance of the liver is otherwise normal.  The enhanced appearance of the pancreas, spleen and bilateral adrenal glands is unremarkable.  There is atrophy of the left kidney (similar to prior), and there are multifocal areas of mild parenchymal thinning in the right kidney, likely reflect areas  of scarring.  Otherwise, there is no focal right renal lesion.  No ascites or pneumoperitoneum. There are a few isolated minimally dilated loops of small bowel (up to 3.4 cm in diameter) in the left upper quadrant of the abdomen.  However, the remainder of the small bowel is otherwise normal in caliber.  No definite pathologic adenopathy noted within the abdomen or pelvis.  The patient is status post total abdominal hysterectomy and bilateral salpingo- oophorectomy.  Urinary bladder is unremarkable in appearance.  Musculoskeletal: There are no aggressive appearing lytic or blastic lesions noted in the visualized portions of the skeleton.  Status post PLIF from L4-S1 with interbody grafts at L4-L5 and L5-S1.  IMPRESSION: 1.  While there is an isolated mildly dilated loop of small bowel in the left upper quadrant of the abdomen (likely jejunal), this is highly nonspecific and there are no definitive signs to strongly suggest the presence of small bowel obstruction.  This may simply relate to transit of peristalsis. 2.  No other potentially acute findings in the abdomen or pelvis to account for the patient's symptoms. 3.  Status post cholecystectomy, appendectomy and total abdominal hysterectomy with bilateral salpingo-oophorectomy. 4.  Unchanged pneumobilia, suggestive of prior sphincterotomy in this patient. 5.  Left-sided renal atrophy with areas of scarring in the renal parenchyma bilaterally.  6.  Status post PLIF from L4-S1 with interbody grafts at L4-L5 and L5-S1.  Original Report Authenticated By: Florencia Reasons, M.D.    Lab Results: Basic  Metabolic Panel:  Basename 05/03/11 0438 05/02/11 1008  NA 138 137  K 4.5 3.6  CL 104 102  CO2 23 23  GLUCOSE 110* 116*  BUN 15 21  CREATININE 0.94 1.10  CALCIUM 9.6 10.7*  MG -- --  PHOS -- --   Liver Function Tests:  [facility] 05/03/11 0438 05/02/11 1008  AST 16 13  ALT 15 20  ALKPHOS 99 128*  BILITOT 0.5 0.5  PROT 6.5 7.8  ALBUMIN 3.3* 4.0      CBC:  Basename 05/03/11 0438 05/02/11 1008  WBC 6.0 10.8*  NEUTROABS -- --  HGB 13.2 15.3*  HCT 38.9 45.0  MCV 90.5 90.9  PLT 208 242       Hospital Course: This very pleasant 71 year old lady was admitted with symptoms of weakness, diarrhea. She had no specific fever. She had no significant abdominal pain. CT scan of her abdomen did not really reveal any inflammation of the colon nor was there any evidence of obstruction. She was treated conservatively with intravenous fluids and symptomatic relief. She has done well. She has tolerated a diet this morning without any nausea vomiting. She has had no further diarrhea. She wishes to go home.  Discharge Exam: Blood pressure 118/74, pulse 54, temperature 98.3 F (36.8 C), temperature source Oral, resp. rate 20, height 5\' 5"  (1.651 m), weight 104.327 kg (230 lb), SpO2 93.00%. She looks systemically well. Her abdomen is soft and nontender. Bowel sounds are heard. Heart sounds are present and normal. Lung fields clear. She is alert and orientated without any focal neurological signs.  Disposition: Home. She will follow with her primary care physician.  Discharge Orders    Future Orders Please Complete By Expires   Diet - low sodium heart healthy      Increase activity slowly           SignedWilson Singer Pager [phone]  05/04/2011, 11:37 AM

## 2011-05-04 NOTE — Progress Notes (Signed)
Patient discharge home with husband.

## 2011-06-12 ENCOUNTER — Ambulatory Visit: Payer: Self-pay | Admitting: Gastroenterology

## 2011-06-12 HISTORY — PX: ESOPHAGOGASTRODUODENOSCOPY: SHX1529

## 2011-06-13 ENCOUNTER — Ambulatory Visit: Payer: Self-pay | Admitting: Internal Medicine

## 2011-06-20 DIAGNOSIS — M48061 Spinal stenosis, lumbar region without neurogenic claudication: Secondary | ICD-10-CM | POA: Insufficient documentation

## 2012-06-09 ENCOUNTER — Ambulatory Visit: Payer: Self-pay | Admitting: Family Medicine

## 2012-09-03 ENCOUNTER — Inpatient Hospital Stay: Payer: Self-pay | Admitting: Specialist

## 2012-09-03 LAB — CBC
MCH: 31.3 pg (ref 26.0–34.0)
MCHC: 34.7 g/dL (ref 32.0–36.0)
MCV: 90 fL (ref 80–100)
RBC: 4.29 10*6/uL (ref 3.80–5.20)
RDW: 14.2 % (ref 11.5–14.5)
WBC: 10.3 10*3/uL (ref 3.6–11.0)

## 2012-09-03 LAB — BASIC METABOLIC PANEL
Calcium, Total: 9.1 mg/dL (ref 8.5–10.1)
Chloride: 108 mmol/L — ABNORMAL HIGH (ref 98–107)
Creatinine: 0.88 mg/dL (ref 0.60–1.30)
Glucose: 89 mg/dL (ref 65–99)
Potassium: 5 mmol/L (ref 3.5–5.1)

## 2012-09-03 LAB — CK TOTAL AND CKMB (NOT AT ARMC)
CK, Total: 168 U/L (ref 21–215)
CK-MB: 0.8 ng/mL (ref 0.5–3.6)

## 2012-09-03 LAB — TROPONIN I: Troponin-I: <0.02 ng/mL

## 2012-09-03 LAB — PRO B NATRIURETIC PEPTIDE: B-Type Natriuretic Peptide: 279 pg/mL — ABNORMAL HIGH (ref 0–125)

## 2012-09-04 LAB — LIPID PANEL
Cholesterol: 182 mg/dL (ref 0–200)
HDL Cholesterol: 63 mg/dL — ABNORMAL HIGH (ref 40–60)
Triglycerides: 137 mg/dL (ref 0–200)
VLDL Cholesterol, Calc: 27 mg/dL (ref 5–40)

## 2012-09-04 LAB — CBC WITH DIFFERENTIAL/PLATELET
Eosinophil #: 0 10*3/uL (ref 0.0–0.7)
HCT: 40.3 % (ref 35.0–47.0)
HGB: 13.7 g/dL (ref 12.0–16.0)
Lymphocyte %: 17.4 %
MCH: 30.8 pg (ref 26.0–34.0)
MCHC: 34.1 g/dL (ref 32.0–36.0)
MCV: 90 fL (ref 80–100)
Monocyte #: 0.4 x10 3/mm (ref 0.2–0.9)
Monocyte %: 3.9 %
Platelet: 234 10*3/uL (ref 150–440)

## 2012-09-04 LAB — BASIC METABOLIC PANEL
Creatinine: 1.35 mg/dL — ABNORMAL HIGH (ref 0.60–1.30)
Glucose: 134 mg/dL — ABNORMAL HIGH (ref 65–99)
Osmolality: 276 (ref 275–301)
Potassium: 4 mmol/L (ref 3.5–5.1)

## 2012-09-04 LAB — MAGNESIUM: Magnesium: 1.8 mg/dL

## 2012-09-11 LAB — CBC
HCT: 44.6 % (ref 35.0–47.0)
HGB: 14.8 g/dL (ref 12.0–16.0)
MCH: 30.6 pg (ref 26.0–34.0)
MCHC: 33.3 g/dL (ref 32.0–36.0)
MCV: 92 fL (ref 80–100)
Platelet: 223 10*3/uL (ref 150–440)
RDW: 14.3 % (ref 11.5–14.5)

## 2012-09-11 LAB — URINALYSIS, COMPLETE
Bacteria: NONE SEEN
Blood: NEGATIVE
Hyaline Cast: 1
Ketone: NEGATIVE
Leukocyte Esterase: NEGATIVE
Protein: NEGATIVE
Squamous Epithelial: <1

## 2012-09-11 LAB — COMPREHENSIVE METABOLIC PANEL
Alkaline Phosphatase: 139 U/L — ABNORMAL HIGH (ref 50–136)
Anion Gap: 7 (ref 7–16)
BUN: 22 mg/dL — ABNORMAL HIGH (ref 7–18)
Bilirubin,Total: 0.5 mg/dL (ref 0.2–1.0)
Calcium, Total: 9 mg/dL (ref 8.5–10.1)
Co2: 26 mmol/L (ref 21–32)
Creatinine: 1.41 mg/dL — ABNORMAL HIGH (ref 0.60–1.30)
Osmolality: 283 (ref 275–301)
Potassium: 4.1 mmol/L (ref 3.5–5.1)
SGOT(AST): 19 U/L (ref 15–37)

## 2012-09-11 LAB — LIPASE, BLOOD: Lipase: 144 U/L (ref 73–393)

## 2012-09-11 LAB — PROTIME-INR: Prothrombin Time: 12.5 secs (ref 11.5–14.7)

## 2012-09-12 LAB — CBC WITH DIFFERENTIAL/PLATELET
Basophil %: 0.2 %
Eosinophil #: 0.1 10*3/uL (ref 0.0–0.7)
HCT: 43.1 % (ref 35.0–47.0)
HGB: 14.2 g/dL (ref 12.0–16.0)
Lymphocyte #: 2.6 10*3/uL (ref 1.0–3.6)
MCH: 30.5 pg (ref 26.0–34.0)
MCHC: 32.9 g/dL (ref 32.0–36.0)
MCV: 93 fL (ref 80–100)
Neutrophil #: 13.5 10*3/uL — ABNORMAL HIGH (ref 1.4–6.5)
Platelet: 208 10*3/uL (ref 150–440)
RBC: 4.65 10*6/uL (ref 3.80–5.20)
RDW: 14.2 % (ref 11.5–14.5)
WBC: 17.5 10*3/uL — ABNORMAL HIGH (ref 3.6–11.0)

## 2012-09-12 LAB — HEMOGLOBIN: HGB: 13 g/dL (ref 12.0–16.0)

## 2012-09-12 LAB — COMPREHENSIVE METABOLIC PANEL
Albumin: 3 g/dL — ABNORMAL LOW (ref 3.4–5.0)
Alkaline Phosphatase: 120 U/L (ref 50–136)
Anion Gap: 8 (ref 7–16)
Calcium, Total: 8.5 mg/dL (ref 8.5–10.1)
Co2: 22 mmol/L (ref 21–32)
EGFR (African American): 54 — ABNORMAL LOW
EGFR (Non-African Amer.): 46 — ABNORMAL LOW
Osmolality: 275 (ref 275–301)
Potassium: 4.7 mmol/L (ref 3.5–5.1)
Sodium: 137 mmol/L (ref 136–145)

## 2012-09-12 LAB — MAGNESIUM: Magnesium: 1.9 mg/dL

## 2012-09-13 LAB — CBC WITH DIFFERENTIAL/PLATELET
Basophil #: 0 10*3/uL (ref 0.0–0.1)
Basophil %: 0.2 %
Eosinophil %: 0.8 %
HCT: 35.2 % (ref 35.0–47.0)
Lymphocyte %: 20.7 %
MCH: 31.5 pg (ref 26.0–34.0)
MCHC: 34.5 g/dL (ref 32.0–36.0)
MCV: 91 fL (ref 80–100)
Monocyte #: 1.2 x10 3/mm — ABNORMAL HIGH (ref 0.2–0.9)
Monocyte %: 11.6 %
Neutrophil #: 7.1 10*3/uL — ABNORMAL HIGH (ref 1.4–6.5)
Platelet: 166 10*3/uL (ref 150–440)
RBC: 3.85 10*6/uL (ref 3.80–5.20)
RDW: 14.1 % (ref 11.5–14.5)

## 2012-09-13 LAB — BASIC METABOLIC PANEL
Anion Gap: 4 — ABNORMAL LOW (ref 7–16)
BUN: 8 mg/dL (ref 7–18)
Co2: 27 mmol/L (ref 21–32)
Creatinine: 1.11 mg/dL (ref 0.60–1.30)
EGFR (African American): 58 — ABNORMAL LOW
EGFR (Non-African Amer.): 50 — ABNORMAL LOW
Glucose: 110 mg/dL — ABNORMAL HIGH (ref 65–99)
Osmolality: 271 (ref 275–301)
Potassium: 4.3 mmol/L (ref 3.5–5.1)
Sodium: 136 mmol/L (ref 136–145)

## 2012-09-14 ENCOUNTER — Inpatient Hospital Stay: Payer: Self-pay | Admitting: Internal Medicine

## 2012-09-14 LAB — BASIC METABOLIC PANEL
Anion Gap: 4 — ABNORMAL LOW (ref 7–16)
Calcium, Total: 8.6 mg/dL (ref 8.5–10.1)
Chloride: 104 mmol/L (ref 98–107)
Creatinine: 1.04 mg/dL (ref 0.60–1.30)
EGFR (African American): >60
EGFR (Non-African Amer.): 54 — ABNORMAL LOW
Osmolality: 271 (ref 275–301)
Potassium: 4 mmol/L (ref 3.5–5.1)
Sodium: 137 mmol/L (ref 136–145)

## 2012-09-16 LAB — PATHOLOGY REPORT

## 2012-10-05 ENCOUNTER — Ambulatory Visit: Payer: Self-pay | Admitting: Gastroenterology

## 2012-10-08 ENCOUNTER — Ambulatory Visit: Payer: Self-pay | Admitting: Gastroenterology

## 2013-02-01 ENCOUNTER — Emergency Department: Payer: Self-pay | Admitting: Emergency Medicine

## 2013-06-28 ENCOUNTER — Other Ambulatory Visit: Payer: Self-pay | Admitting: Anesthesiology

## 2013-07-05 ENCOUNTER — Encounter (HOSPITAL_COMMUNITY): Payer: Self-pay | Admitting: Pharmacy Technician

## 2013-07-06 NOTE — Pre-Procedure Instructions (Signed)
Madison Jennings  07/06/2013   Your procedure is scheduled on:  Fri, May 15 @ 12:30 PM  Report to [facility]  at 10:30 AM.  Call this number if you have problems the morning of surgery: [phone]   Remember:   Do not eat food or drink liquids after midnight.   Take these medicines the morning of surgery with A SIP OF WATER: Gabapentin(Neurontin),Synthroid(Levothyroxine),Pain Pill(if needed),and Pantoprazole(Protonix)              Stop taking your Ibuprofen. No Goody's,BC's,Aleve,Aspirin,Fish Oil,or any Herbal Medications   Do not wear jewelry, make-up or nail polish.  Do not wear lotions, powders, or perfumes. You may wear deodorant.  Do not shave 48 hours prior to surgery.   Do not bring valuables to the hospital.  [facility] is not responsible                  for any belongings or valuables.               Contacts, dentures or bridgework may not be worn into surgery.  Leave suitcase in the car. After surgery it may be brought to your room.  For patients admitted to the hospital, discharge time is determined by your                treatment team.               Patients discharged the day of surgery will not be allowed to drive  home.    Special Instructions:  [facility] - Preparing for Surgery  Before surgery, you can play an important role.  Because skin is not sterile, your skin needs to be as free of germs as possible.  You can reduce the number of germs on you skin by washing with CHG (chlorahexidine gluconate) soap before surgery.  CHG is an antiseptic cleaner which kills germs and bonds with the skin to continue killing germs even after washing.  Please DO NOT use if you have an allergy to CHG or antibacterial soaps.  If your skin becomes reddened/irritated stop using the CHG and inform your nurse when you arrive at Short Stay.  Do not shave (including legs and underarms) for at least 48 hours prior to the first CHG shower.  You may shave your face.  Please  follow these instructions carefully:   1.  Shower with CHG Soap the night before surgery and the                                morning of Surgery.  2.  If you choose to wash your hair, wash your hair first as usual with your       normal shampoo.  3.  After you shampoo, rinse your hair and body thoroughly to remove the                      Shampoo.  4.  Use CHG as you would any other liquid soap.  You can apply chg directly       to the skin and wash gently with scrungie or a clean washcloth.  5.  Apply the CHG Soap to your body ONLY FROM THE NECK DOWN.        Do not use on open wounds or open sores.  Avoid contact with your eyes,       ears,  mouth and genitals (private parts).  Wash genitals (private parts)       with your normal soap.  6.  Wash thoroughly, paying special attention to the area where your surgery        will be performed.  7.  Thoroughly rinse your body with warm water from the neck down.  8.  DO NOT shower/wash with your normal soap after using and rinsing off       the CHG Soap.  9.  Pat yourself dry with a clean towel.            10.  Wear clean pajamas.            11.  Place clean sheets on your bed the night of your first shower and do not        sleep with pets.  Day of Surgery  Do not apply any lotions/deoderants the morning of surgery.  Please wear clean clothes to the hospital/surgery center.     Please read over the following fact sheets that you were given: Pain Booklet, Coughing and Deep Breathing and Surgical Site Infection Prevention

## 2013-07-07 ENCOUNTER — Encounter (HOSPITAL_COMMUNITY): Payer: Self-pay

## 2013-07-07 ENCOUNTER — Ambulatory Visit (HOSPITAL_COMMUNITY)
Admission: RE | Admit: 2013-07-07 | Discharge: 2013-07-07 | Disposition: A | Discharge status: Home / Self Care | Payer: Medicare Other | Source: Ambulatory Visit | Attending: Anesthesiology | Admitting: Anesthesiology

## 2013-07-07 ENCOUNTER — Encounter (HOSPITAL_COMMUNITY)
Admission: RE | Admit: 2013-07-07 | Discharge: 2013-07-07 | Disposition: A | Discharge status: Home / Self Care | Payer: Medicare Other | Source: Ambulatory Visit | Attending: Anesthesiology | Admitting: Anesthesiology

## 2013-07-07 DIAGNOSIS — Z01818 Encounter for other preprocedural examination: Secondary | ICD-10-CM | POA: Insufficient documentation

## 2013-07-07 DIAGNOSIS — I10 Essential (primary) hypertension: Secondary | ICD-10-CM | POA: Insufficient documentation

## 2013-07-07 DIAGNOSIS — K5730 Diverticulosis of large intestine without perforation or abscess without bleeding: Secondary | ICD-10-CM | POA: Diagnosis not present

## 2013-07-07 DIAGNOSIS — IMO0002 Reserved for concepts with insufficient information to code with codable children: Secondary | ICD-10-CM | POA: Diagnosis present

## 2013-07-07 DIAGNOSIS — Z01812 Encounter for preprocedural laboratory examination: Secondary | ICD-10-CM | POA: Insufficient documentation

## 2013-07-07 DIAGNOSIS — G8929 Other chronic pain: Secondary | ICD-10-CM | POA: Diagnosis not present

## 2013-07-07 DIAGNOSIS — E785 Hyperlipidemia, unspecified: Secondary | ICD-10-CM | POA: Diagnosis not present

## 2013-07-07 DIAGNOSIS — E039 Hypothyroidism, unspecified: Secondary | ICD-10-CM | POA: Diagnosis not present

## 2013-07-07 DIAGNOSIS — Z79899 Other long term (current) drug therapy: Secondary | ICD-10-CM | POA: Diagnosis not present

## 2013-07-07 DIAGNOSIS — Z01810 Encounter for preprocedural cardiovascular examination: Secondary | ICD-10-CM | POA: Insufficient documentation

## 2013-07-07 DIAGNOSIS — K219 Gastro-esophageal reflux disease without esophagitis: Secondary | ICD-10-CM | POA: Diagnosis not present

## 2013-07-07 DIAGNOSIS — M4120 Other idiopathic scoliosis, site unspecified: Secondary | ICD-10-CM | POA: Diagnosis not present

## 2013-07-07 DIAGNOSIS — M961 Postlaminectomy syndrome, not elsewhere classified: Secondary | ICD-10-CM | POA: Diagnosis not present

## 2013-07-07 DIAGNOSIS — E559 Vitamin D deficiency, unspecified: Secondary | ICD-10-CM | POA: Diagnosis not present

## 2013-07-07 DIAGNOSIS — IMO0001 Reserved for inherently not codable concepts without codable children: Secondary | ICD-10-CM | POA: Diagnosis not present

## 2013-07-07 HISTORY — DX: Hyperlipidemia, unspecified: E78.5

## 2013-07-07 HISTORY — DX: Hypothyroidism, unspecified: E03.9

## 2013-07-07 HISTORY — DX: Nocturia: R35.1

## 2013-07-07 HISTORY — DX: Vitamin D deficiency, unspecified: E55.9

## 2013-07-07 HISTORY — DX: Unspecified osteoarthritis, unspecified site: M19.90

## 2013-07-07 HISTORY — DX: Pain in unspecified joint: M25.50

## 2013-07-07 HISTORY — DX: Headache: R51

## 2013-07-07 HISTORY — DX: Diverticulosis of intestine, part unspecified, without perforation or abscess without bleeding: K57.90

## 2013-07-07 HISTORY — DX: Fibromyalgia: M79.7

## 2013-07-07 LAB — URINALYSIS, ROUTINE W REFLEX MICROSCOPIC
Bilirubin Urine: NEGATIVE
Glucose, UA: NEGATIVE mg/dL
Hgb urine dipstick: NEGATIVE
Ketones, ur: NEGATIVE mg/dL
Nitrite: NEGATIVE
Protein, ur: NEGATIVE mg/dL
Specific Gravity, Urine: 1.021 (ref 1.005–1.030)
Urobilinogen, UA: 0.2 mg/dL (ref 0.0–1.0)
pH: 5.5 (ref 5.0–8.0)

## 2013-07-07 LAB — CBC WITH DIFFERENTIAL/PLATELET
Basophils Absolute: 0 10*3/uL (ref 0.0–0.1)
Basophils Relative: 0 % (ref 0–1)
Eosinophils Absolute: 0.1 10*3/uL (ref 0.0–0.7)
Eosinophils Relative: 2 % (ref 0–5)
HCT: 41.2 % (ref 36.0–46.0)
Hemoglobin: 14 g/dL (ref 12.0–15.0)
Lymphocytes Relative: 47 % — ABNORMAL HIGH (ref 12–46)
Lymphs Abs: 3.5 10*3/uL (ref 0.7–4.0)
MCH: 31.5 pg (ref 26.0–34.0)
MCHC: 34 g/dL (ref 30.0–36.0)
MCV: 92.8 fL (ref 78.0–100.0)
Monocytes Absolute: 0.6 10*3/uL (ref 0.1–1.0)
Monocytes Relative: 8 % (ref 3–12)
Neutro Abs: 3.2 10*3/uL (ref 1.7–7.7)
Neutrophils Relative %: 43 % (ref 43–77)
Platelets: 226 10*3/uL (ref 150–400)
RBC: 4.44 MIL/uL (ref 3.87–5.11)
RDW: 13.6 % (ref 11.5–15.5)
WBC: 7.4 10*3/uL (ref 4.0–10.5)

## 2013-07-07 LAB — BASIC METABOLIC PANEL
BUN: 20 mg/dL (ref 6–23)
CO2: 23 mEq/L (ref 19–32)
Calcium: 9.3 mg/dL (ref 8.4–10.5)
Chloride: 102 mEq/L (ref 96–112)
Creatinine, Ser: 1.07 mg/dL (ref 0.50–1.10)
GFR calc Af Amer: 59 mL/min — ABNORMAL LOW (ref >90)
GFR calc non Af Amer: 51 mL/min — ABNORMAL LOW (ref >90)
Glucose, Bld: 95 mg/dL (ref 70–99)
Potassium: 5.1 mEq/L (ref 3.7–5.3)
Sodium: 138 mEq/L (ref 137–147)

## 2013-07-07 LAB — URINE MICROSCOPIC-ADD ON

## 2013-07-07 LAB — PROTIME-INR
INR: 0.96 (ref 0.00–1.49)
Prothrombin Time: 12.6 seconds (ref 11.6–15.2)

## 2013-07-07 LAB — APTT: aPTT: 34 seconds (ref 24–37)

## 2013-07-07 LAB — SURGICAL PCR SCREEN
MRSA, PCR: NEGATIVE
Staphylococcus aureus: NEGATIVE

## 2013-07-07 NOTE — Progress Notes (Signed)
Spoke with Lorriane Shire, CMA at Dr. Maryjean Ka office to make MD aware of pt abnormal UA.

## 2013-07-07 NOTE — Progress Notes (Addendum)
Dr.Calwood in [place] is cardiologist with last visit around Sept 2014  Echo to be requested from [facility] around 2014  Stress test done about 38yrs ago to request from [place] cath about 70yrs ago  Denies CXR or EKG in past yr   Medical Md is Dr.Emma Jimmye Norman

## 2013-07-08 ENCOUNTER — Encounter (HOSPITAL_COMMUNITY): Payer: Self-pay | Admitting: Vascular Surgery

## 2013-07-08 NOTE — Progress Notes (Signed)
Anesthesia Chart Review:  Patient is a 73 year old female scheduled for spinal cord stimulation implant on 07/09/13 by Dr. Clydell Hakim.    History includes nonsmoker, hypothyroidism (s/p radioactive ablation), GERD, hypertension, hyperlipidemia, fibromyalgia, chronic back pain, diverticulosis, cholecystectomy, hysterectomy, back surgery. BMI is 37.6 consistent with obesity.  PCP is Dr. Tomasita Morrow.  Cardiologist is Dr. Clayborn Bigness with [facility]), last visit 10/2012 for follow-up murmur and palpitations.  Apparently, she was thought to have ischemic colitis in 08/2012, so cardiac source needed to be ruled out.  No evidence of afib or cardiac thrombus identified.  Echo on 11/03/12 (([facility]) showed: Normal LV systolic function, EF 77%. Mild left ventricular hypertrophy. Mild mitral insufficiency. Aortic valve thickened with trace insufficiency. Trace tricuspid insufficiency.  24 hour Holter monitor on 10/14/12 showed: Leslie sinus bradycardia, occasional PVCs, No significant PACs, no runs, no pauses, no high-grade block. Conclusion: No clear indication for pacemaker placement at this time for antiarrhythmic. Would treat the patient conservatively and medically for now unless symptoms persist, worsen or recur..  Cardiac cath on 04/13/10 [facility]) showed normal coronaries, normal renal arteries, normal wall motion, EF 55%.  EKG on 07/07/13 showed: SB @ 53 bpm, PACs, incomplete right BBB.  Preoperative CXR and labs noted.  Anticipate that she can proceed as planned.  George Hugh [facility] Short Stay Center/Anesthesiology Phone [phone] 07/08/2013 9:45 AM

## 2013-07-09 ENCOUNTER — Ambulatory Visit (HOSPITAL_COMMUNITY): Admission: RE | Admit: 2013-07-09 | Payer: Medicare Other | Source: Ambulatory Visit | Admitting: Anesthesiology

## 2013-07-09 ENCOUNTER — Encounter (HOSPITAL_COMMUNITY): Admission: RE | Payer: Self-pay | Source: Ambulatory Visit

## 2013-07-09 SURGERY — INSERTION, SPINAL CORD STIMULATOR, LUMBAR
Anesthesia: Monitor Anesthesia Care

## 2013-07-12 ENCOUNTER — Encounter (HOSPITAL_COMMUNITY): Payer: Self-pay | Admitting: Pharmacy Technician

## 2013-07-12 DIAGNOSIS — G8929 Other chronic pain: Secondary | ICD-10-CM | POA: Insufficient documentation

## 2013-07-13 ENCOUNTER — Ambulatory Visit: Payer: Self-pay | Admitting: Family Medicine

## 2013-07-15 MED ORDER — CEFAZOLIN SODIUM-DEXTROSE 2-3 GM-% IV SOLR
2.0000 g | INTRAVENOUS | Status: AC
  Administered 2013-07-16: 2 g via INTRAVENOUS
  Filled 2013-07-15: qty 50

## 2013-07-16 ENCOUNTER — Ambulatory Visit (HOSPITAL_COMMUNITY)
Admission: RE | Admit: 2013-07-16 | Discharge: 2013-07-16 | Disposition: A | Discharge status: Home / Self Care | Payer: Medicare Other | Source: Ambulatory Visit | Attending: Anesthesiology | Admitting: Anesthesiology

## 2013-07-16 ENCOUNTER — Encounter (HOSPITAL_COMMUNITY)
Admission: RE | Disposition: A | Discharge status: Home / Self Care | Payer: Self-pay | Source: Ambulatory Visit | Attending: Anesthesiology

## 2013-07-16 ENCOUNTER — Encounter (HOSPITAL_COMMUNITY): Payer: Self-pay | Admitting: *Deleted

## 2013-07-16 ENCOUNTER — Ambulatory Visit (HOSPITAL_COMMUNITY): Payer: Medicare Other

## 2013-07-16 ENCOUNTER — Ambulatory Visit (HOSPITAL_COMMUNITY): Payer: Medicare Other | Admitting: Vascular Surgery

## 2013-07-16 ENCOUNTER — Encounter (HOSPITAL_COMMUNITY): Payer: Medicare Other | Admitting: Vascular Surgery

## 2013-07-16 DIAGNOSIS — E785 Hyperlipidemia, unspecified: Secondary | ICD-10-CM | POA: Insufficient documentation

## 2013-07-16 DIAGNOSIS — K5730 Diverticulosis of large intestine without perforation or abscess without bleeding: Secondary | ICD-10-CM | POA: Insufficient documentation

## 2013-07-16 DIAGNOSIS — E039 Hypothyroidism, unspecified: Secondary | ICD-10-CM | POA: Insufficient documentation

## 2013-07-16 DIAGNOSIS — E559 Vitamin D deficiency, unspecified: Secondary | ICD-10-CM | POA: Insufficient documentation

## 2013-07-16 DIAGNOSIS — M961 Postlaminectomy syndrome, not elsewhere classified: Secondary | ICD-10-CM | POA: Insufficient documentation

## 2013-07-16 DIAGNOSIS — Z79899 Other long term (current) drug therapy: Secondary | ICD-10-CM | POA: Insufficient documentation

## 2013-07-16 DIAGNOSIS — M4120 Other idiopathic scoliosis, site unspecified: Secondary | ICD-10-CM | POA: Insufficient documentation

## 2013-07-16 DIAGNOSIS — G8929 Other chronic pain: Secondary | ICD-10-CM | POA: Insufficient documentation

## 2013-07-16 DIAGNOSIS — IMO0001 Reserved for inherently not codable concepts without codable children: Secondary | ICD-10-CM | POA: Insufficient documentation

## 2013-07-16 DIAGNOSIS — K219 Gastro-esophageal reflux disease without esophagitis: Secondary | ICD-10-CM | POA: Insufficient documentation

## 2013-07-16 DIAGNOSIS — I10 Essential (primary) hypertension: Secondary | ICD-10-CM | POA: Insufficient documentation

## 2013-07-16 HISTORY — PX: SPINAL CORD STIMULATOR INSERTION: SHX5378

## 2013-07-16 SURGERY — INSERTION, SPINAL CORD STIMULATOR, LUMBAR
Anesthesia: Monitor Anesthesia Care | Site: Back

## 2013-07-16 MED ORDER — HYDROMORPHONE HCL PF 1 MG/ML IJ SOLN
0.2500 mg | INTRAMUSCULAR | Status: DC | PRN
Start: 2013-07-16 — End: 2013-07-16
  Administered 2013-07-16 (×3): 0.5 mg via INTRAVENOUS

## 2013-07-16 MED ORDER — BACITRACIN ZINC 500 UNIT/GM EX OINT
TOPICAL_OINTMENT | CUTANEOUS | Status: AC
  Filled 2013-07-16: qty 15

## 2013-07-16 MED ORDER — PROPOFOL 10 MG/ML IV EMUL
5.0000 ug/kg/min | INTRAVENOUS | Status: DC
  Filled 2013-07-16: qty 100

## 2013-07-16 MED ORDER — OXYCODONE HCL 5 MG PO TABS
ORAL_TABLET | ORAL | Status: AC
  Filled 2013-07-16: qty 1

## 2013-07-16 MED ORDER — LACTATED RINGERS IV SOLN
INTRAVENOUS | Status: DC | PRN
  Administered 2013-07-16: 07:00:00 via INTRAVENOUS

## 2013-07-16 MED ORDER — CEPHALEXIN 500 MG PO CAPS: 500.0000 mg | ORAL_CAPSULE | Freq: Three times a day (TID) | ORAL | Status: DC

## 2013-07-16 MED ORDER — OXYCODONE HCL 5 MG PO TABS
5.0000 mg | ORAL_TABLET | Freq: Once | ORAL | Status: AC
  Administered 2013-07-16: 5 mg via ORAL

## 2013-07-16 MED ORDER — MIDAZOLAM HCL 5 MG/5ML IJ SOLN
INTRAMUSCULAR | Status: DC | PRN
  Administered 2013-07-16 (×2): 1 mg via INTRAVENOUS

## 2013-07-16 MED ORDER — OXYCODONE-ACETAMINOPHEN 5-325 MG PO TABS: 1.0000 | ORAL_TABLET | ORAL | Status: DC | PRN

## 2013-07-16 MED ORDER — BUPIVACAINE-EPINEPHRINE (PF) 0.5% -1:200000 IJ SOLN
INTRAMUSCULAR | Status: DC | PRN
  Administered 2013-07-16: 30 mL

## 2013-07-16 MED ORDER — PROPOFOL INFUSION 10 MG/ML OPTIME
INTRAVENOUS | Status: DC | PRN
  Administered 2013-07-16: 25 ug/kg/min via INTRAVENOUS

## 2013-07-16 MED ORDER — HYDROMORPHONE HCL PF 1 MG/ML IJ SOLN
0.5000 mg | Freq: Once | INTRAMUSCULAR | Status: AC
  Administered 2013-07-16: 0.5 mg via INTRAVENOUS

## 2013-07-16 MED ORDER — MIDAZOLAM HCL 2 MG/2ML IJ SOLN
INTRAMUSCULAR | Status: AC
  Filled 2013-07-16: qty 2

## 2013-07-16 MED ORDER — BUPIVACAINE-EPINEPHRINE (PF) 0.5% -1:200000 IJ SOLN
INTRAMUSCULAR | Status: AC
  Filled 2013-07-16: qty 30

## 2013-07-16 MED ORDER — FENTANYL CITRATE 0.05 MG/ML IJ SOLN
INTRAMUSCULAR | Status: AC
  Filled 2013-07-16: qty 5

## 2013-07-16 MED ORDER — BACITRACIN-NEOMYCIN-POLYMYXIN OINTMENT TUBE
TOPICAL_OINTMENT | CUTANEOUS | Status: DC | PRN
  Administered 2013-07-16: 1 via TOPICAL

## 2013-07-16 MED ORDER — HYDROMORPHONE HCL PF 1 MG/ML IJ SOLN
INTRAMUSCULAR | Status: AC
  Administered 2013-07-16: 0.5 mg via INTRAVENOUS
  Filled 2013-07-16: qty 1

## 2013-07-16 MED ORDER — FENTANYL CITRATE 0.05 MG/ML IJ SOLN
INTRAMUSCULAR | Status: DC | PRN
Start: 2013-07-16 — End: 2013-07-16
  Administered 2013-07-16 (×2): 50 ug via INTRAVENOUS
  Administered 2013-07-16: 75 ug via INTRAVENOUS
  Administered 2013-07-16: 50 ug via INTRAVENOUS
  Administered 2013-07-16: 25 ug via INTRAVENOUS

## 2013-07-16 MED ORDER — BACITRACIN-NEOMYCIN-POLYMYXIN 400-5-5000 EX OINT
TOPICAL_OINTMENT | CUTANEOUS | Status: AC
  Filled 2013-07-16: qty 1

## 2013-07-16 MED ORDER — SODIUM CHLORIDE 0.9 % IR SOLN
Status: DC | PRN
  Administered 2013-07-16: 08:00:00

## 2013-07-16 MED ORDER — PROMETHAZINE HCL 25 MG/ML IJ SOLN
6.2500 mg | INTRAMUSCULAR | Status: DC | PRN
Start: 2013-07-16 — End: 2013-07-16

## 2013-07-16 MED ORDER — PROPOFOL 10 MG/ML IV BOLUS
INTRAVENOUS | Status: AC
  Filled 2013-07-16: qty 20

## 2013-07-16 SURGICAL SUPPLY — 70 items
ADH SKN CLS APL DERMABOND .7 (GAUZE/BANDAGES/DRESSINGS)
ADHESIVE MEDICAL (Stimulator) ×1 IMPLANT
ANCH LD 4 SET SPNL CORD STM (Anchor) ×1 IMPLANT
ANCHOR CLICK (Anchor) ×1 IMPLANT
ANCHOR CLIK NEURO F/LEAD 2 (Anchor) IMPLANT
APL SKNCLS STERI-STRIP NONHPOA (GAUZE/BANDAGES/DRESSINGS)
BAG DECANTER FOR FLEXI CONT (MISCELLANEOUS) ×2 IMPLANT
BENZOIN TINCTURE PRP APPL 2/3 (GAUZE/BANDAGES/DRESSINGS) IMPLANT
BINDER ABDOMINAL 12 ML 46-62 (SOFTGOODS) ×1 IMPLANT
BLADE SURG ROTATE 9660 (MISCELLANEOUS) IMPLANT
CHLORAPREP W/TINT 26ML (MISCELLANEOUS) ×2 IMPLANT
CONT SPEC 4OZ CLIKSEAL STRL BL (MISCELLANEOUS) ×1 IMPLANT
COVER SURGICAL LIGHT HANDLE (MISCELLANEOUS) ×1 IMPLANT
DERMABOND ADVANCED (GAUZE/BANDAGES/DRESSINGS)
DERMABOND ADVANCED .7 DNX12 (GAUZE/BANDAGES/DRESSINGS) ×1 IMPLANT
DRAPE C-ARM 42X72 X-RAY (DRAPES) ×2 IMPLANT
DRAPE C-ARMOR (DRAPES) ×2 IMPLANT
DRAPE LAPAROTOMY 100X72X124 (DRAPES) ×2 IMPLANT
DRAPE POUCH INSTRU U-SHP 10X18 (DRAPES) ×2 IMPLANT
DRAPE SURG 17X23 STRL (DRAPES) ×1 IMPLANT
DRSG OPSITE POSTOP 3X4 (GAUZE/BANDAGES/DRESSINGS) ×2 IMPLANT
DRSG OPSITE POSTOP 4X6 (GAUZE/BANDAGES/DRESSINGS) IMPLANT
ELECT REM PT RETURN 9FT ADLT (ELECTROSURGICAL) ×2
ELECTRODE REM PT RTRN 9FT ADLT (ELECTROSURGICAL) ×1 IMPLANT
GAUZE SPONGE 4X4 16PLY XRAY LF (GAUZE/BANDAGES/DRESSINGS) ×1 IMPLANT
GLOVE BIO SURGEON STRL SZ7 (GLOVE) ×2 IMPLANT
GLOVE BIOGEL PI IND STRL 7.0 (GLOVE) IMPLANT
GLOVE BIOGEL PI IND STRL 7.5 (GLOVE) ×1 IMPLANT
GLOVE BIOGEL PI INDICATOR 7.0 (GLOVE) ×2
GLOVE BIOGEL PI INDICATOR 7.5 (GLOVE) ×1
GLOVE ECLIPSE 7.5 STRL STRAW (GLOVE) ×2 IMPLANT
GOWN STRL REUS W/ TWL LRG LVL3 (GOWN DISPOSABLE) IMPLANT
GOWN STRL REUS W/ TWL XL LVL3 (GOWN DISPOSABLE) IMPLANT
GOWN STRL REUS W/TWL 2XL LVL3 (GOWN DISPOSABLE) IMPLANT
GOWN STRL REUS W/TWL LRG LVL3 (GOWN DISPOSABLE)
GOWN STRL REUS W/TWL XL LVL3 (GOWN DISPOSABLE) ×6
IPG PRECISION SPECTRA (Stimulator) ×1 IMPLANT
KIT BASIN OR (CUSTOM PROCEDURE TRAY) ×2 IMPLANT
KIT CHARGING (KITS) ×1
KIT CHARGING PRECISION NEURO (KITS) IMPLANT
KIT REMOTE CONTROL PRECISION (KITS) ×1 IMPLANT
KIT ROOM TURNOVER OR (KITS) ×1 IMPLANT
KIT SPLITTER 30CM 2X8 (Stimulator) ×2 IMPLANT
LEAD KIT CONTACT INFINION 16 (Stimulator) ×2 IMPLANT
MEDICAL ADHESIVE ×1 IMPLANT
NDL 18GX1X1/2 (RX/OR ONLY) (NEEDLE) IMPLANT
NDL HYPO 25X1 1.5 SAFETY (NEEDLE) ×1 IMPLANT
NEEDLE 18GX1X1/2 (RX/OR ONLY) (NEEDLE) IMPLANT
NEEDLE HYPO 25X1 1.5 SAFETY (NEEDLE) ×2 IMPLANT
NS IRRIG 1000ML POUR BTL (IV SOLUTION) ×2 IMPLANT
PACK LAMINECTOMY NEURO (CUSTOM PROCEDURE TRAY) ×2 IMPLANT
PAD ARMBOARD 7.5X6 YLW CONV (MISCELLANEOUS) ×3 IMPLANT
PERIFEX 5ML GLASS LUER LOCK LOSS-OF-RESISTANCE SYRINGE ×1 IMPLANT
SPONGE LAP 4X18 X RAY DECT (DISPOSABLE) ×2 IMPLANT
STAPLER VISISTAT 35W (STAPLE) ×1 IMPLANT
STRIP CLOSURE SKIN 1/2X4 (GAUZE/BANDAGES/DRESSINGS) IMPLANT
SUT MNCRL AB 4-0 PS2 18 (SUTURE) IMPLANT
SUT SILK 0 (SUTURE) ×2
SUT SILK 0 FSL (SUTURE) ×2 IMPLANT
SUT SILK 0 MO-6 18XCR BRD 8 (SUTURE) ×1 IMPLANT
SUT SILK 0 TIES 10X30 (SUTURE) IMPLANT
SUT SILK 2 0 TIES 10X30 (SUTURE) IMPLANT
SUT VIC AB 2-0 CP2 18 (SUTURE) ×3 IMPLANT
SUT VIC AB 2-0 SH 18 (SUTURE) ×3 IMPLANT
SYR BULB 3OZ (MISCELLANEOUS) ×1 IMPLANT
SYRINGE 10CC LL (SYRINGE) ×1 IMPLANT
TOWEL OR 17X24 6PK STRL BLUE (TOWEL DISPOSABLE) ×2 IMPLANT
TOWEL OR 17X26 10 PK STRL BLUE (TOWEL DISPOSABLE) ×2 IMPLANT
WATER STERILE IRR 1000ML POUR (IV SOLUTION) ×1 IMPLANT
YANKAUER SUCT BULB TIP NO VENT (SUCTIONS) ×2 IMPLANT

## 2013-07-16 NOTE — Progress Notes (Signed)
Orthopedic Tech Progress Note Patient Details:  Madison Jennings 09/07/1940 [id number]  Ortho Devices Type of Ortho Device: Abdominal binder Ortho Device/Splint Interventions: Application   Theodoro Parma Cammer 07/16/2013, 9:54 AM

## 2013-07-16 NOTE — Anesthesia Procedure Notes (Signed)
Procedure Name: MAC Date/Time: 07/16/2013 7:49 AM Performed by: Babs Bertin Pre-anesthesia Checklist: Patient identified, Emergency Drugs available, Suction available, Patient being monitored and Timeout performed Patient Re-evaluated:Patient Re-evaluated prior to inductionOxygen Delivery Method: Nasal cannula

## 2013-07-16 NOTE — Anesthesia Postprocedure Evaluation (Signed)
Anesthesia Post Note  Patient: Madison Jennings  Procedure(s) Performed: Procedure(s) (LRB): LUMBAR SPINAL CORD STIMULATOR INSERTION (N/A)  Anesthesia type: MAC  Patient location: PACU  Post pain: Pain level controlled  Post assessment: Patient's Cardiovascular Status Stable  Last Vitals:  Filed Vitals:   07/16/13 1023  BP:   Pulse:   Temp: 36.3 C  Resp:     Post vital signs: Reviewed and stable  Level of consciousness: sedated  Complications: No apparent anesthesia complications

## 2013-07-16 NOTE — Op Note (Signed)
PREOP DX: 1) lumbago  2) lumbar radiculopathy  3) lumbar post-laminectomy syndrome  4) chronic pain  POSTOP DX: 1) lumbago  2) lumbar radiculopathy  3) lumbar post-laminectomy syndrome  4) chronic pain  PROCEDURES PERFORMED:1) intraop fluoro 2) placement of 2 16 contact [employer] Infinion leads 3) placement of Spectra SCS generator  SURGEON:Shani Fitch  ASSISTANT: NONE  ANESTHESIA: MAC  EBL: <20cc  DESCRIPTION OF PROCEDURE: After a discussion of risks, benefits and alternatives, informed consent was obtained. The patient was taken to the OR, turned prone onto a Jackson table, all pressure points padded, SCD's placed, and an adequate plane of anesthesia induced. A timeout was taken to verify the correct patient, position, personnel, availability of appropriate equipment, and administration of perioperative antibiotics.  The thoracic and lumbar areas were widely prepped with chloraprep and draped into a sterile field. Fluoroscopy was used to plan a left paramedian incision at the L1-L3 levels, and an incision made with a 10 blade and carried down to the dorsolumbar fascia with the bovie and blunt dissection. Retractors were placed and a 14g [employer] tuohy needle placed into the epidural space at the L1-2 interspace using biplanar fluoro and loss-of-resistance technique. The needle was aspirated without any return of fluid. A [employer] INFINION lead was introduced and under live AP fluoro advanced until the distal-most contact overlay the inferior aspect T7vertebral body shadow with the rest of the contacts distributed over the T8and T9 vertebral bodies in a position just right of anatomic midline. A second Infinion lead was placed just left of anatomic midline in the same levels using the same technique. The patient was awakened and the leads tested; impedances were good, and the patient reported good coverage with amplitudes in the 3-7 mA range. 0 silk sutures were placed in the  fascia adjacent to the needles. The needles and stylets were removed under fluoroscopy with no lead migration noted. Leads were then fixed to the fascia by clik anchor fixation into position with the sutures; repeat images were obtained to verify that there had been no lead migration.  The incision was inspected and hemostasis obtained with the bipolar cautery.  Attention was then turned to creation of a subcutaneous pocket. At the left flank, a 3 cm incision was made with a 10 blade and using the bovie and blunt dissection a pocket of size appropriate to place a Secretary/administrator. The pocket was trialed, and found to be of adequate size. The pocket was inspected for hemostasis, which was found to be excellent. Using reverse seldinger technique, the leads were tunneled to the pocket site, and the leads inserted into the SCS generator. Impedances were checked, and all found to be excellent. The leads were then all fixed into position with a self-torquing wrench. The wiring was all carefully coiled, placed behind the generator and placed in the pocket.  Both incisions were copiously irrigated with bacitracin-containing irrigation. The lumbar incision was closed in 2 deep layers of interrupted 2-0 vicryl and the skin closed with staples. The pocket incision was closed with a deeper layer of 2-0 vicryl interrupted sutures, and the skin closed with staples. Sterile dressings were applied. Needle, sponge, and instrument counts were correct x2 at the end of the case.  The patient was then carefully awakened from anesthesia, turned supine, an abdominal binder placed, and the patient taken to the recovery room where he underwent complex spinal cord stimulator programming.  COMPLICATIONS: NONE  CONDITION: Stable throughout the course of  the procedure and immediately afterward  DISPOSITION: discharge to home, with antibiotics and pain medicine. Discussed care with the patient and spouse. Followup  in clinic will be scheduled in 10-14 days.

## 2013-07-16 NOTE — Anesthesia Preprocedure Evaluation (Addendum)
Anesthesia Evaluation  Patient identified by MRN, date of birth, ID band Patient awake    Reviewed: Allergy & Precautions, H&P , NPO status , Patient's Chart, lab work & pertinent test results  History of Anesthesia Complications Negative for: history of anesthetic complications  Airway Mallampati: I TM Distance: >3 FB Neck ROM: Full    Dental  (+) Edentulous Upper, Dental Advisory Given   Pulmonary neg pulmonary ROS,    Pulmonary exam normal       Cardiovascular hypertension, Pt. on medications     Neuro/Psych  Neuromuscular disease negative psych ROS   GI/Hepatic Neg liver ROS, GERD-  Medicated,  Endo/Other  Hypothyroidism   Renal/GU negative Renal ROS     Musculoskeletal  (+) Fibromyalgia -, narcotic dependent  Abdominal   Peds  Hematology   Anesthesia Other Findings   Reproductive/Obstetrics                        Anesthesia Physical Anesthesia Plan  ASA: III  Anesthesia Plan: MAC   Post-op Pain Management:    Induction: Intravenous  Airway Management Planned: Nasal Cannula  Additional Equipment:   Intra-op Plan:   Post-operative Plan: Extubation in OR  Informed Consent: I have reviewed the patients History and Physical, chart, labs and discussed the procedure including the risks, benefits and alternatives for the proposed anesthesia with the patient or authorized representative who has indicated his/her understanding and acceptance.   Dental advisory given  Plan Discussed with: CRNA, Anesthesiologist and Surgeon  Anesthesia Plan Comments:       Anesthesia Quick Evaluation

## 2013-07-16 NOTE — Transfer of Care (Signed)
Immediate Anesthesia Transfer of Care Note  Patient: Madison Jennings  Procedure(s) Performed: Procedure(s): LUMBAR SPINAL CORD STIMULATOR INSERTION (N/A)  Patient Location: PACU  Anesthesia Type:MAC  Level of Consciousness: awake, alert  and oriented  Airway & Oxygen Therapy: Patient Spontanous Breathing and Patient connected to nasal cannula oxygen  Post-op Assessment: Report given to PACU RN and Post -op Vital signs reviewed and stable  Post vital signs: Reviewed and stable  Complications: No apparent anesthesia complications

## 2013-07-16 NOTE — H&P (Signed)
Madison Jennings is an 73 y.o. female.   Chief Complaint: back pain HPI:  73 year old woman with a long-standing history of low back pain and radiating symptoms into her lower extremities; patient is undergone numerous surgical interventions (please see below), conservative interventional and medication management strategies, and still continues to have debilitating back pain and radiating symptoms into her legs.  The symptoms of vertically worsened over the last several months.  Patient's daughter was operated on by Dr. Danice Goltz did well; the patient thus went and saw Dr. Louanne Skye for surgical opinion regarding her back.  According to the patient, Dr. Darylene Price did not identify any surgical lesion, and referred her to Dr. Laurence Spates.  Dr. Ernestina Patches ultimately has done a spinal cord stimulator trial.  Patient reports to me today a 50-60% improvement in her overall pain.  She is accompanied by her husband.  Together they reported distinct improvement in her overall functionality.  The patient has difficulty tolerating medications, and currently would like to come off of her Neurontin as she doesn't find it to particular beneficial, it contributes to her weight gain, and upsets her stomach.  Patient has had a psychological evaluation with Dr. Thayer Ohm, and been found to be a suitable candidate from that standpoint.  She is thus referred to me for implantation of a permanent spinal cord stimulator system.  On presentation today the patient has been able to review her history with me.  She describes back pain as well as radiating symptoms into her lower extremity.  Modifying factors that'll improve her pain include rest.  She has a more remote history of L2 to L4 fusion, with return to the OR to remove a screw, it seems.  She was seen by Dr. Ellene Route in 2007-2008, and had her previous hardware removed, and fusion from L4 to S1 accomplished.  PAST MEDICAL HISTORY: Hypertension Hypothyroidism GERD   RELEVANT PAST SURGICAL  HISTORY: As above     Past Medical History  Diagnosis Date  . Chronic back pain   . Hypothyroidism     takes Synthroid daily  . GERD (gastroesophageal reflux disease)     takes Protonix daily  . Hypertension     takes Lisinopril daily  . Hyperlipidemia     just diagnosed;will follow up in 6wks to discuss meds  . Headache(784.0)     sinus related  . Fibromyalgia   . Arthritis   . Joint pain   . Chronic back pain     scoliosis and 2 buldging disc  . Vitamin D deficiency     just placed on Vit D this week  . Diverticulosis   . Nocturia     Past Surgical History  Procedure Laterality Date  . Abdominal hysterectomy    . Cholecystectomy    . Foot surgery Left     d/t neuroma  . Back surgery      x 3  . Radiation to thyroid    . Colonoscopy    . Cardiac catheterization  04/13/2010    normal cororanies, EF 55% ([facility]; Dr. Clayborn Bigness)    History reviewed. No pertinent family history. Social History:  reports that she has never smoked. She does not have any smokeless tobacco history on file. She reports that she does not drink alcohol or use illicit drugs.  Allergies: No Known Allergies  Medications Prior to Admission  Medication Sig Dispense Refill  . gabapentin (NEURONTIN) 300 MG capsule Take 300 mg by mouth 3 (three) times daily.      Marland Kitchen  ibuprofen (ADVIL,MOTRIN) 200 MG tablet Take 200 mg by mouth every 6 (six) hours as needed for mild pain.      Marland Kitchen levothyroxine (SYNTHROID, LEVOTHROID) 75 MCG tablet Take 75 mcg by mouth daily before breakfast.      . lisinopril (PRINIVIL,ZESTRIL) 40 MG tablet Take 40 mg by mouth daily.      Marland Kitchen oxyCODONE-acetaminophen (PERCOCET/ROXICET) 5-325 MG per tablet Take 1 tablet by mouth every 8 (eight) hours as needed for severe pain.      . pantoprazole (PROTONIX) 40 MG tablet Take 40 mg by mouth daily.      . Vitamin D, Ergocalciferol, (DRISDOL) 50000 UNITS CAPS capsule Take 50,000 Units by mouth every 7 (seven) days. wednesday        No results  found for this or any previous visit (from the past 48 hour(s)). No results found.  Review of Systems  HENT: Negative.   Eyes: Negative.   Respiratory: Negative.   Cardiovascular: Negative.   Gastrointestinal: Negative.   Genitourinary: Negative.   Musculoskeletal: Negative.   Skin: Negative.   Neurological: Negative.   Psychiatric/Behavioral: Negative.     Blood pressure 111/49, pulse 48, temperature 97.9 F (36.6 C), resp. rate 20, height 5\' 5"  (1.651 m), weight 102.513 kg (226 lb), SpO2 97.00%. Physical Exam  Constitutional: She is oriented to person, place, and time. She appears well-developed and well-nourished.  HENT:  Head: Normocephalic and atraumatic.  Eyes: EOM are normal. Pupils are equal, round, and reactive to light.  Cardiovascular: Normal rate and regular rhythm.   Musculoskeletal: Normal range of motion.  Neurological: She is alert and oriented to person, place, and time.  Skin: Skin is warm and dry.  Psychiatric: She has a normal mood and affect. Her behavior is normal. Thought content normal.     Assessment/Plan A) chronic pain Lumbar post-laminectomy syndrome Lumbago Lumbar radiculopathy P) permanent SCS implant.  Bonna Gains 07/16/2013, 7:33 AM

## 2013-07-22 ENCOUNTER — Encounter (HOSPITAL_COMMUNITY): Payer: Self-pay | Admitting: Anesthesiology

## 2013-07-28 ENCOUNTER — Emergency Department: Payer: Self-pay | Admitting: Emergency Medicine

## 2013-11-22 ENCOUNTER — Ambulatory Visit (HOSPITAL_COMMUNITY)
Admission: RE | Admit: 2013-11-22 | Discharge: 2013-11-22 | Disposition: A | Discharge status: Home / Self Care | Payer: Medicare Other | Source: Ambulatory Visit | Attending: Anesthesiology | Admitting: Anesthesiology

## 2013-11-22 DIAGNOSIS — M25519 Pain in unspecified shoulder: Secondary | ICD-10-CM | POA: Diagnosis not present

## 2013-11-22 DIAGNOSIS — IMO0001 Reserved for inherently not codable concepts without codable children: Secondary | ICD-10-CM | POA: Diagnosis not present

## 2013-11-22 DIAGNOSIS — I10 Essential (primary) hypertension: Secondary | ICD-10-CM | POA: Insufficient documentation

## 2013-11-22 DIAGNOSIS — R29898 Other symptoms and signs involving the musculoskeletal system: Secondary | ICD-10-CM | POA: Diagnosis not present

## 2013-11-22 DIAGNOSIS — M25512 Pain in left shoulder: Secondary | ICD-10-CM

## 2013-11-22 DIAGNOSIS — E785 Hyperlipidemia, unspecified: Secondary | ICD-10-CM | POA: Diagnosis not present

## 2013-11-22 DIAGNOSIS — M542 Cervicalgia: Secondary | ICD-10-CM | POA: Insufficient documentation

## 2013-11-22 NOTE — Evaluation (Signed)
Occupational Therapy Evaluation  Patient Details  Name: Madison Jennings MRN: [id number] Date of Birth: 09/28/1940  Today's Date: 11/22/2013 Time: 1015-1101 OT Time Calculation (min): 46 min Eval 1015-1038 (23') Manual 1038-1046 (8') Self Care 1046-1101 (15')  Visit#: 1 of 16  Re-eval: 12/20/13  Assessment Diagnosis: Left shoulder and neck pain Surgical Date:  (n/a) Next MD Visit: 3 months  Authorization: BCBS and Medicare  Authorization Time Period: Before 10th visit  Authorization Visit#: 1 of 10   Past Medical History:  Past Medical History  Diagnosis Date  . Chronic back pain   . Hypothyroidism     takes Synthroid daily  . GERD (gastroesophageal reflux disease)     takes Protonix daily  . Hypertension     takes Lisinopril daily  . Hyperlipidemia     just diagnosed;will follow up in 6wks to discuss meds  . Headache(784.0)     sinus related  . Fibromyalgia   . Arthritis   . Joint pain   . Chronic back pain     scoliosis and 2 buldging disc  . Vitamin D deficiency     just placed on Vit D this week  . Diverticulosis   . Nocturia    Past Surgical History:  Past Surgical History  Procedure Laterality Date  . Abdominal hysterectomy    . Cholecystectomy    . Foot surgery Left     d/t neuroma  . Back surgery      x 3  . Radiation to thyroid    . Colonoscopy    . Cardiac catheterization  04/13/2010    normal cororanies, EF 55% ([facility]; Dr. Clayborn Bigness)  . Spinal cord stimulator insertion N/A 07/16/2013    Procedure: LUMBAR SPINAL CORD STIMULATOR INSERTION;  Surgeon: Bonna Gains, MD;  Location: [place];  Service: Neurosurgery;  Laterality: N/A;    Subjective Symptoms/Limitations Symptoms: S: "It just hurts so bad, and it just started earlier this year.  I'm afraid its coming from something in my neck." Pertinent History: Pt is 73 yo female presenting to outpatient OT with left neck and shoulder/biceps pain.  Pain began earlier this year, and pt's MD gave her two  cortison injections. Pt reports that injections were not helpful in decreasing pain, an MD has referred her for therapy. Pt returns to MD in 3 months, and pending success of therapy pt will receive and x-ray at that time.  Of note, pt had third back surgery in may of this year, in whihc metal rodes were placed along ehr spine for nerve pain. Limitations: progress as tolerated Special Tests: FOTO 39/100 Patient Stated Goals: for it to quit hurting Pain Assessment Currently in Pain?: Yes Pain Score: 5  (at worst 10/10) Pain Location: Shoulder Pain Orientation: Left Pain Type: Acute pain Pain Onset: More than a month ago Pain Frequency: Constant  Precautions/Restrictions  Precautions Precautions: None Restrictions Weight Bearing Restrictions: No  Balance Screening Balance Screen Has the patient fallen in the past 6 months: No  Prior [place] expects to be discharged to:: Private residence Living Arrangements: Spouse/significant other Prior Function Level of Independence: Independent with basic ADLs;Independent with homemaking with ambulation;Needs assistance with ADLs;Needs assistance with homemaking (prior independent, but has required increased assist this ye) Driving: Yes (when she has to) 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  she cannot open a jar with her left hand and cannot reach into kitchen cabinets.  She rpefers sleeping on her left side and has been unable to. Dominant Hand: Right Vision - History Baseline Vision: Bifocals  Cognition/Observation Cognition Overall Cognitive Status: Within Functional Limits for tasks assessed Arousal/Alertness:  Awake/alert Orientation Level: Oriented X4  Sensation/Coordination/Edema Sensation Light Touch: Appears Intact Coordination Gross Motor Movements are Fluid and Coordinated: Yes Fine Motor Movements are Fluid and Coordinated: Yes  Additional Assessments LUE AROM (degrees) LUE Overall AROM Comments: Assessed in supine, with ER/IR shoulder adducted Left Shoulder Flexion: 127 Degrees Left Shoulder ABduction: 85 Degrees Left Shoulder Internal Rotation: 90 Degrees Left Shoulder External Rotation: 61 Degrees LUE PROM (degrees) LUE Overall PROM Comments: Assessed in supine, with ER/IR adducted Left Shoulder Flexion: 144 Degrees (8/10 pain) Left Shoulder ABduction: 110 Degrees (8/10 pain) Left Shoulder Internal Rotation: 100 Degrees Left Shoulder External Rotation: 68 Degrees LUE Strength LUE Overall Strength Comments: MMT not complted this session due to pt's increased pain and decreased AROM in sitting Grip (lbs): 20 (Right 40 lbs) Cervical AROM Overall Cervical AROM Comments: Assess in sitting, measured in cm Cervical Flexion: 4 Cervical Extension: 16 Cervical - Right Side Bend: 19.5 Cervical - Left Side Bend: 18.5 Cervical - Right Rotation: 20.5 Cervical - Left Rotation: 18 Palpation Palpation: Pt has max fascial restrictions in scapular and trap regions, and mod restrictiosn in LUE bicep    Manual Therapy Manual Therapy: Myofascial release Myofascial Release: Myofascial release (MFR) and manual stretching to LUE bicep, trap, and scapular regions to decrease fascial restrictions and promote improved ROM with decrease pain  Occupational Therapy Assessment and Plan OT Assessment and Plan Clinical Impression Statement: Pt is presenting to outpatient OT with increased pain, decreased ROM, increased fascial restrictions, and decreased functional use of LUE, with onset of earleir this year. Pt will benefit from skilled therapeutic intervention in order to improve on the following  deficits: Increased fascial restricitons;Impaired UE functional use;Decreased strength;Decreased range of motion;Pain Rehab Potential: Good OT Frequency: Min 2X/week OT Duration: 8 weeks OT Treatment/Interventions: Self-care/ADL training;Therapeutic exercise;Manual therapy;Patient/family education;Therapeutic activities;Modalities OT Plan: Pt will benefit from skilled OT services to decrease pain, decrease fascial restrictions, improve ROM, increase strenght, and improve overall LUE functional use.     Treatment Plan: myofascial release (MFR) and manual stretching, PROM, AAROM, AROM, scapular strenghtening, general strehening, proximal shoulder strengthening, modalities for pain PRN  (not e-stim due to rods from back surgery)   Goals Home Exercise Program Pt/caregiver will Perform Home Exercise Program: For increased ROM PT Goal: Perform Home Exercise Program - Progress: Goal set today Short Term Goals Time to Complete Short Term Goals: 4 weeks Short Term Goal 1: Pt will be educated on HEP Short Term Goal 2: Pt will improve LUE PROM to WNL for improved ability in working towards improved reaching into cabinets Short Term Goal 3: Pt will have LUE shoulder strenght of atleast 3+/5 in working toward improving abiltiy to go fishing Short Term Goal 4: Pt will decrease fascial restrictions to min-mod for decreased pain with movement Short Term Goal 5: Pt will have pain of less than 3/10 at rest. Long Term Goals Time to Complete Long Term Goals: 8 weeks Long Term Goal 1: Pt will achieve highest level of functioning in all ADL, IADL, and leisure activities Long Term Goal 2: Pt will improve LUE seated AROM to [facility] for improved ability to reach into kitchen cabinets  Long Term Goal 3: Pt will have strength in LUE of atleast 4/5 for improved ability to engage in fishing with husband. Long Term Goal 4: Pt will decrease fascial restrictions to minimal for decreased pain with movement in LUE Long Term Goal  5: Pt will have pain in LUE of less than 3/10 during daily activities.  Problem List Patient Active Problem List   Diagnosis Date Noted  . Pain in joint, shoulder region 11/22/2013  . Shoulder weakness 11/22/2013  . Diarrhea 05/02/2011  . Generalized weakness 05/02/2011  . Dehydration 05/02/2011  . HTN (hypertension) 05/02/2011  . Hypothyroidism 05/02/2011  . GERD (gastroesophageal reflux disease) 05/02/2011    End of Session Activity Tolerance: Patient tolerated treatment well General Behavior During Therapy: WFL for tasks assessed/performed OT Plan of Care OT Home Exercise Plan: Towel Slides OT Patient Instructions: Explained and dmonstreated.  Pt return demonstrated.  Handout provided (scanned) Consulted and Agree with Plan of Care: Patient  GO Functional Assessment Tool Used: FOTO 39/100 (61% llimited) Functional Limitation: Carrying, moving and handling objects Carrying, Moving and Handling Objects Current Status (E9381): At least 60 percent but less than 80 percent impaired, limited or restricted Carrying, Moving and Handling Objects Goal Status [phone]): At least 20 percent but less than 40 percent impaired, limited or restricted   Bea Graff, [place], OTR/L [phone]  11/22/2013, 12:33 PM  Physician Documentation Your signature is required to indicate approval of the treatment plan as stated above.  Please sign and either send electronically or make a copy of this report for your files and return this physician signed original.  Please mark one 1.__approve of plan  2. ___approve of plan with the following conditions.   ______________________________                                                          _____________________ Physician Signature                                                                                                             Date

## 2013-11-26 ENCOUNTER — Ambulatory Visit (HOSPITAL_COMMUNITY)
Admission: RE | Admit: 2013-11-26 | Discharge: 2013-11-26 | Disposition: A | Discharge status: Home / Self Care | Payer: Medicare Other | Source: Ambulatory Visit | Attending: Anesthesiology | Admitting: Anesthesiology

## 2013-11-26 DIAGNOSIS — Z5189 Encounter for other specified aftercare: Secondary | ICD-10-CM | POA: Diagnosis not present

## 2013-11-26 DIAGNOSIS — M542 Cervicalgia: Secondary | ICD-10-CM | POA: Insufficient documentation

## 2013-11-26 DIAGNOSIS — E785 Hyperlipidemia, unspecified: Secondary | ICD-10-CM | POA: Diagnosis not present

## 2013-11-26 DIAGNOSIS — M25512 Pain in left shoulder: Secondary | ICD-10-CM | POA: Insufficient documentation

## 2013-11-26 DIAGNOSIS — I10 Essential (primary) hypertension: Secondary | ICD-10-CM | POA: Diagnosis not present

## 2013-11-26 DIAGNOSIS — R29898 Other symptoms and signs involving the musculoskeletal system: Secondary | ICD-10-CM | POA: Insufficient documentation

## 2013-11-26 NOTE — Progress Notes (Signed)
Occupational Therapy Treatment Patient Details  Name: Madison Jennings MRN: [id number] Date of Birth: 09-06-1940  Today's Date: 11/26/2013 Time: 1020-1112 OT Time Calculation (min): 52 min MFR: 1020-1035 15' Therex: 8546-2703 27' Moist Heat: 5009-3818 10'  Visit#: 2 of 16  Re-eval: 12/20/13    Authorization: BCBS and Medicare  Authorization Time Period: Before 10th visit  Authorization Visit#: 2 of 10  Subjective Symptoms/Limitations Symptoms: S: I'm hurting in my neck  and shoulders on both sides and my back is hurting me today.  Pain Assessment Currently in Pain?: Yes Pain Score: 7  Pain Location: Shoulder Pain Orientation: Right;Left Pain Type: Acute pain Multiple Pain Sites: Yes  Precautions/Restrictions  Precautions Precautions: None  Exercise/Treatments Supine Protraction: PROM;10 reps Horizontal ABduction: PROM;10 reps External Rotation: PROM;10 reps Internal Rotation: PROM;10 reps Flexion: PROM;10 reps ABduction: PROM;10 reps  Seated Extension: AROM;10 reps   Therapy Ball Flexion: 10 reps ABduction: 10 reps      Seated Exercises Cervical Rotation: 10 reps Lateral Flexion: 10 reps Shoulder Shrugs: 10 reps (10 shrugs up & 10 shrugs back) Other Seated Exercise: Cervical flexion/extension 10 reps       Modalities Modalities: Moist Heat Manual Therapy Manual Therapy: Myofascial release Myofascial Release: Myofascial release (MFR) and manual stretching to LUE bicep, trap, scapular, and cervical regions to decrease fascial restrictions and promote improved ROM with decrease pain Moist Heat Therapy Number Minutes Moist Heat: 10 Minutes Moist Heat Location: Shoulder (lower back)  Occupational Therapy Assessment and Plan OT Assessment and Plan Clinical Impression Statement: A: Began PROM exercises, cervical AROM exercises/stretches and myofascial release and manual therapy. Patient attempted AAROM but could not perform due to increased pain. Patient  tolerated treatment well.  Patient reported pain in bilateral shoulders, cervical region, and lower back at beginning of session. Patient reported increased pain in lower back at end of session but neck and shoulder felt better because back pain was bad. Patient ended therapy session with moist heat applied to left shoulder and lower back.   OT Plan: P: Follow up on doctor's order for PT services. Continue PROM and cervical stretches. Progress to [facility] when pain is under control.    Goals Home Exercise Program Pt/caregiver will Perform Home Exercise Program: For increased ROM Short Term Goals Time to Complete Short Term Goals: 4 weeks Short Term Goal 1: Pt will be educated on HEP Short Term Goal 2: Pt will improve LUE PROM to WNL for improved ability in working towards improved reaching into cabinets Short Term Goal 3: Pt will have LUE shoulder strenght of atleast 3+/5 in working toward improving abiltiy to go fishing Short Term Goal 4: Pt will decrease fascial restrictions to min-mod for decreased pain with movement Short Term Goal 5: Pt will have pain of less than 3/10 at rest. Long Term Goals Time to Complete Long Term Goals: 8 weeks Long Term Goal 1: Pt will achieve highest level of functioning in all ADL, IADL, and leisure activities Long Term Goal 2: Pt will improve LUE seated AROM to [facility] for improved ability to reach into kitchen cabinets Long Term Goal 3: Pt will have strength in LUE of atleast 4/5 for improved ability to engage in fishing with husband. Long Term Goal 4: Pt will decrease fascial restrictions to minimal for decreased pain with movement in LUE Long Term Goal 5: Pt will have pain in LUE of less than 3/10 during daily activities.  Problem List Patient Active Problem List   Diagnosis Date Noted  . Pain  in joint, shoulder region 11/22/2013  . Shoulder weakness 11/22/2013  . Diarrhea 05/02/2011  . Generalized weakness 05/02/2011  . Dehydration 05/02/2011  . HTN  (hypertension) 05/02/2011  . Hypothyroidism 05/02/2011  . GERD (gastroesophageal reflux disease) 05/02/2011    End of Session Activity Tolerance: Patient tolerated treatment well General Behavior During Therapy: [facility] for tasks assessed/performed OT Plan of Care OT Home Exercise Plan: neck stretches OT Patient Instructions: handout (scanned) Consulted and Agree with Plan of Care: Patient   Ailene Ravel, OTR/L,CBIS   11/26/2013, 12:40 PM

## 2013-11-29 ENCOUNTER — Ambulatory Visit (HOSPITAL_COMMUNITY)
Admission: RE | Admit: 2013-11-29 | Discharge: 2013-11-29 | Disposition: A | Discharge status: Home / Self Care | Payer: Medicare Other | Source: Ambulatory Visit

## 2013-11-29 DIAGNOSIS — M25512 Pain in left shoulder: Secondary | ICD-10-CM

## 2013-11-29 DIAGNOSIS — Z5189 Encounter for other specified aftercare: Secondary | ICD-10-CM | POA: Diagnosis not present

## 2013-11-29 DIAGNOSIS — R29898 Other symptoms and signs involving the musculoskeletal system: Secondary | ICD-10-CM

## 2013-11-29 NOTE — Progress Notes (Signed)
Occupational Therapy Treatment Patient Details  Name: Madison Jennings MRN: [id number] Date of Birth: 02-25-1941  Today's Date: 11/29/2013 Time: 5027-7412 OT Time Calculation (min): 58 min MFR: 8786-7672 18' Therex: 1035-1105 30' Moist Heat: 1105-1115 10'  Visit#: 3 of 16  Re-eval: 12/20/13    Authorization: BCBS and Medicare  Authorization Time Period: Before 10th visit  Authorization Visit#: 3 of 10  Subjective Symptoms/Limitations Symptoms: S: I had a bad night last night, my shoulders and back are hurting me something bad.  Pain Assessment Currently in Pain?: Yes Pain Score: 8  Pain Location: Shoulder Pain Orientation: Left Pain Type: Acute pain  Precautions/Restrictions  Precautions Precautions: None  Exercise/Treatments Supine Protraction: PROM;AAROM;10 reps Horizontal ABduction: PROM;AAROM;10 reps External Rotation: PROM;AAROM;10 reps Internal Rotation: PROM;AAROM;10 reps Flexion: PROM;AAROM;10 reps ABduction: PROM;AAROM;10 reps Seated Extension: AROM;10 reps Retraction: AROM;10 reps   Therapy Ball Flexion: 10 reps ABduction: 10 reps    Seated Exercises Cervical Rotation: 10 reps Lateral Flexion: 10 reps Shoulder Shrugs: 10 reps Other Seated Exercise: Cervical flexion/extension 10 reps       Modalities Modalities: Moist Heat Manual Therapy Manual Therapy: Myofascial release Myofascial Release: Myofascial release (MFR) and manual stretching to LUE bicep, trap, scapular, and cervical regions to decrease fascial restrictions and promote improved ROM with decrease pain Moist Heat Therapy Number Minutes Moist Heat: 10 Minutes Moist Heat Location: Shoulder (lower back)  Occupational Therapy Assessment and Plan OT Assessment and Plan Clinical Impression Statement: A: Continued cervical AROM exercises. Began AAROM exercises in supine. Patient tolerated treatment well. Patient ended therapy session with moist heat applied to lower back and left shoulder.  Patient reported decreased pain of 2/10 in left shoulder at end of session, lower back was still at 8/10 pain.  OT Plan: P: Follow up on doctor's order for PT services. If the order has not been signed, send to Dr. Loa Socks. Attempt AAROM in sitting, proximal shoulder strengthening in supine.    Goals Home Exercise Program Pt/caregiver will Perform Home Exercise Program: For increased ROM Short Term Goals Time to Complete Short Term Goals: 4 weeks Short Term Goal 1: Pt will be educated on HEP Short Term Goal 1 Progress: Progressing toward goal Short Term Goal 2: Pt will improve LUE PROM to WNL for improved ability in working towards improved reaching into cabinets Short Term Goal 2 Progress: Progressing toward goal Short Term Goal 3: Pt will have LUE shoulder strenght of atleast 3+/5 in working toward improving abiltiy to go fishing Short Term Goal 3 Progress: Progressing toward goal Short Term Goal 4: Pt will decrease fascial restrictions to min-mod for decreased pain with movement Short Term Goal 4 Progress: Progressing toward goal Short Term Goal 5: Pt will have pain of less than 3/10 at rest. Short Term Goal 5 Progress: Progressing toward goal Long Term Goals Time to Complete Long Term Goals: 8 weeks Long Term Goal 1: Pt will achieve highest level of functioning in all ADL, IADL, and leisure activities Long Term Goal 1 Progress: Progressing toward goal Long Term Goal 2: Pt will improve LUE seated AROM to [facility] for improved ability to reach into kitchen cabinets Long Term Goal 2 Progress: Progressing toward goal Long Term Goal 3: Pt will have strength in LUE of atleast 4/5 for improved ability to engage in fishing with husband. Long Term Goal 3 Progress: Progressing toward goal Long Term Goal 4: Pt will decrease fascial restrictions to minimal for decreased pain with movement in LUE Long Term Goal 4 Progress: Progressing  toward goal Long Term Goal 5: Pt will have pain in LUE of less  than 3/10 during daily activities. Long Term Goal 5 Progress: Progressing toward goal  Problem List Patient Active Problem List   Diagnosis Date Noted  . Pain in joint, shoulder region 11/22/2013  . Shoulder weakness 11/22/2013  . Diarrhea 05/02/2011  . Generalized weakness 05/02/2011  . Dehydration 05/02/2011  . HTN (hypertension) 05/02/2011  . Hypothyroidism 05/02/2011  . GERD (gastroesophageal reflux disease) 05/02/2011    End of Session Activity Tolerance: Patient tolerated treatment well General Behavior During Therapy: [facility] for tasks assessed/performed   Ailene Ravel, OTR/L,CBIS   11/29/2013, 1:56 PM

## 2013-12-02 ENCOUNTER — Ambulatory Visit (HOSPITAL_COMMUNITY): Payer: Medicare Other

## 2013-12-03 ENCOUNTER — Ambulatory Visit (HOSPITAL_COMMUNITY)
Admission: RE | Admit: 2013-12-03 | Discharge: 2013-12-03 | Disposition: A | Discharge status: Home / Self Care | Payer: Medicare Other | Source: Ambulatory Visit | Attending: Anesthesiology | Admitting: Anesthesiology

## 2013-12-03 DIAGNOSIS — Z5189 Encounter for other specified aftercare: Secondary | ICD-10-CM | POA: Diagnosis not present

## 2013-12-03 DIAGNOSIS — M25512 Pain in left shoulder: Secondary | ICD-10-CM

## 2013-12-03 DIAGNOSIS — R29898 Other symptoms and signs involving the musculoskeletal system: Secondary | ICD-10-CM

## 2013-12-03 NOTE — Progress Notes (Signed)
Occupational Therapy Treatment Patient Details  Name: Madison Jennings MRN: [id number] Date of Birth: January 21, 1941  Today's Date: 12/03/2013 Time: 1022-1103 OT Time Calculation (min): 41 min Manual therapy 2637-8588 22' Therapeutic exercises 5027-7412 9' Heat 1053-1103 10'  Visit#: 4 of 16  Re-eval: 12/20/13    Authorization: BCBS and Medicare  Authorization Time Period: Before 10th visit  Authorization Visit#: 4 of 10  Subjective  S:  If I move my shoulder at all it hurts so bad. Limitations: progress as tolerated Pain Assessment Currently in Pain?: Yes Pain Score: 6  Pain Location: Shoulder Pain Orientation: Left Pain Type: Acute pain  Precautions/Restrictions   progress as tolerated  Exercise/Treatments Supine Protraction: PROM;10 reps Horizontal ABduction: PROM;10 reps External Rotation: PROM;10 reps Internal Rotation: PROM;10 reps Flexion: PROM;10 reps ABduction: PROM;10 reps Seated Extension: AROM;15 reps Retraction: AROM;15 reps Row: AROM;15 reps Isometric Strengthening   Flexion: Supine;3X3" Extension: Supine;3X3" External Rotation: Supine;3X3" Internal Rotation: Supine;3X3" ABduction: Supine;3X3" ADduction: Supine;3X3"     Manual Therapy Manual Therapy: Myofascial release Myofascial Release: Myofascial release (MFR) and manual stretching to LUE bicep, trap, scapular, and cervical regions to decrease fascial restrictions and promote improved ROM with decrease pain Moist Heat Therapy Number Minutes Moist Heat: 10 Minutes Moist Heat Location: Shoulder (seated left shoulder for pain control)  Occupational Therapy Assessment and Plan OT Assessment and Plan Clinical Impression Statement: A:  Patient continues to have elevated pain throughout her body.  She gets minimal pain relief from heat.  Sent an order to Dr. Louanne Skye today for a referral for PT for patients low back pain. Unable to complete AAROM in supine this date due to elevated pain.  OT Plan: P:   Follow up on PT order sent to Dr. Louanne Skye.     Goals Home Exercise Program Pt/caregiver will Perform Home Exercise Program: For increased ROM Short Term Goals Time to Complete Short Term Goals: 4 weeks Short Term Goal 1: Pt will be educated on HEP Short Term Goal 2: Pt will improve LUE PROM to WNL for improved ability in working towards improved reaching into cabinets Short Term Goal 3: Pt will have LUE shoulder strenght of atleast 3+/5 in working toward improving abiltiy to go fishing Short Term Goal 4: Pt will decrease fascial restrictions to min-mod for decreased pain with movement Short Term Goal 5: Pt will have pain of less than 3/10 at rest. Long Term Goals Time to Complete Long Term Goals: 8 weeks Long Term Goal 1: Pt will achieve highest level of functioning in all ADL, IADL, and leisure activities Long Term Goal 2: Pt will improve LUE seated AROM to [facility] for improved ability to reach into kitchen cabinets Long Term Goal 3: Pt will have strength in LUE of atleast 4/5 for improved ability to engage in fishing with husband. Long Term Goal 4: Pt will decrease fascial restrictions to minimal for decreased pain with movement in LUE Long Term Goal 5: Pt will have pain in LUE of less than 3/10 during daily activities.  Problem List Patient Active Problem List   Diagnosis Date Noted  . Pain in joint, shoulder region 11/22/2013  . Shoulder weakness 11/22/2013  . Diarrhea 05/02/2011  . Generalized weakness 05/02/2011  . Dehydration 05/02/2011  . HTN (hypertension) 05/02/2011  . Hypothyroidism 05/02/2011  . GERD (gastroesophageal reflux disease) 05/02/2011    End of Session Activity Tolerance: Patient tolerated treatment well General Behavior During Therapy: [facility] for tasks assessed/performed  [place], OTR/L [phone]  12/03/2013, 11:42 AM

## 2013-12-06 ENCOUNTER — Ambulatory Visit (HOSPITAL_COMMUNITY)
Admission: RE | Admit: 2013-12-06 | Discharge: 2013-12-06 | Disposition: A | Discharge status: Home / Self Care | Payer: Medicare Other | Source: Ambulatory Visit

## 2013-12-06 DIAGNOSIS — Z5189 Encounter for other specified aftercare: Secondary | ICD-10-CM | POA: Diagnosis not present

## 2013-12-06 NOTE — Progress Notes (Addendum)
Occupational Therapy Treatment Patient Details  Name: Madison Jennings MRN: [id number] Date of Birth: 12/26/1940  Today's Date: 12/06/2013 Time: 6606-0045 OT Time Calculation (min): 41 min Manual 9977-4142 (24') Therapeutic Exercises 1042-1059 (17')  Visit#: 5 of 16  Re-eval: 12/20/13    Authorization: BCBS and Medicare  Authorization Time Period: Before 10th visit  Authorization Visit#: 5 of 10  Subjective Symptoms/Limitations Symptoms: "its not good. but tis not bad as it was on Friday." Pain Assessment Currently in Pain?: Yes Pain Score: 4  Pain Location: Shoulder Pain Orientation: Left Pain Type: Acute pain  Precautions/Restrictions     Exercise/Treatments Supine Protraction: PROM;5 reps;AAROM;10 reps Horizontal ABduction: PROM;5 reps;AAROM;10 reps External Rotation: PROM;5 reps;AAROM;10 reps Internal Rotation: PROM;5 reps;AAROM;10 reps Flexion: PROM;5 reps;AAROM;10 reps ABduction: PROM;5 reps;AAROM;10 reps Seated Extension: AROM;15 reps Retraction: AROM;15 reps Row: AROM;15 reps ROM / Strengthening / Isometric Strengthening Prot/Ret//Elev/Dep: 1 min   Manual Therapy Manual Therapy: Myofascial release Myofascial Release: Myofascial release (MFR) and manual stretching to LUE bicep, trap, scapular, and cervical regions to decrease fascial restrictions and promote improved ROM with decrease pain.    Occupational Therapy Assessment and Plan OT Assessment and Plan Clinical Impression Statement: Have not received order for PT from Dr. Louanne Skye.  Pt has decreased pain over session on Friday - had improved tolerance of exercises. Completed AAROM supine this session, with good tolerance.    Pt tolerated well seated extension, row, and retraction.  Added pro/ret/elev/dep, with good tolerance.   OT Plan: P:  Follow up on PT order sent to Dr. Louanne Skye.  Add theraband to row/extention exercises.   Goals Short Term Goals Short Term Goal 1: Pt will be educated on HEP Short Term  Goal 1 Progress: Progressing toward goal Short Term Goal 2: Pt will improve LUE PROM to WNL for improved ability in working towards improved reaching into cabinets Short Term Goal 2 Progress: Progressing toward goal Short Term Goal 3: Pt will have LUE shoulder strenght of atleast 3+/5 in working toward improving abiltiy to go fishing Short Term Goal 3 Progress: Progressing toward goal Short Term Goal 4: Pt will decrease fascial restrictions to min-mod for decreased pain with movement Short Term Goal 4 Progress: Progressing toward goal Short Term Goal 5: Pt will have pain of less than 3/10 at rest. Short Term Goal 5 Progress: Progressing toward goal Long Term Goals Long Term Goal 1: Pt will achieve highest level of functioning in all ADL, IADL, and leisure activities Long Term Goal 1 Progress: Progressing toward goal Long Term Goal 2: Pt will improve LUE seated AROM to [facility] for improved ability to reach into kitchen cabinets Long Term Goal 2 Progress: Progressing toward goal Long Term Goal 3: Pt will have strength in LUE of atleast 4/5 for improved ability to engage in fishing with husband. Long Term Goal 3 Progress: Progressing toward goal Long Term Goal 4: Pt will decrease fascial restrictions to minimal for decreased pain with movement in LUE Long Term Goal 4 Progress: Progressing toward goal Long Term Goal 5: Pt will have pain in LUE of less than 3/10 during daily activities. Long Term Goal 5 Progress: Progressing toward goal  Problem List Patient Active Problem List   Diagnosis Date Noted  . Pain in joint, shoulder region 11/22/2013  . Shoulder weakness 11/22/2013  . Diarrhea 05/02/2011  . Generalized weakness 05/02/2011  . Dehydration 05/02/2011  . HTN (hypertension) 05/02/2011  . Hypothyroidism 05/02/2011  . GERD (gastroesophageal reflux disease) 05/02/2011    End of Session  Activity Tolerance: Patient tolerated treatment well General Behavior During Therapy: [facility] for tasks  assessed/performed  GO    Bea Graff Jennise Both, MS, OTR/L [place] [phone] 12/06/2013, 12:07 PM

## 2013-12-10 ENCOUNTER — Ambulatory Visit (HOSPITAL_COMMUNITY)
Admission: RE | Admit: 2013-12-10 | Discharge: 2013-12-10 | Disposition: A | Discharge status: Home / Self Care | Payer: Medicare Other | Source: Ambulatory Visit | Attending: Anesthesiology | Admitting: Anesthesiology

## 2013-12-10 DIAGNOSIS — Z5189 Encounter for other specified aftercare: Secondary | ICD-10-CM | POA: Diagnosis not present

## 2013-12-10 NOTE — Progress Notes (Signed)
Occupational Therapy Treatment Patient Details  Name: Madison Jennings MRN: [id number] Date of Birth: 1940/03/17  Today's Date: 12/10/2013 Time: 1012-1103 OT Time Calculation (min): 51 min Manual 1012-1043 (31') Therapeutic Exercises 1043-1103 (20')  Visit#: 6 of 16  Re-eval: 12/20/13    Authorization: BCBS and Medicare  Authorization Time Period: Before 10th visit  Authorization Visit#: 6 of 10  Subjective Symptoms/Limitations Symptoms: "its just achey.  Its not so severe today, not as bad as it was yesterday. Its gets moving as soon as I really start moving my arms." Pain Assessment Currently in Pain?: Yes Pain Score: 5  Pain Location: Shoulder Pain Orientation: Left Pain Type: Acute pain  Exercise/Treatments Supine Protraction: PROM;5 reps;AAROM;10 reps Horizontal ABduction: PROM;5 reps;AAROM;10 reps External Rotation: PROM;5 reps;AAROM;10 reps Internal Rotation: PROM;5 reps;AAROM;10 reps Flexion: PROM;5 reps;AAROM;10 reps ABduction: PROM;5 reps;AAROM;10 reps Standing Extension: Theraband;10 reps Theraband Level (Shoulder Extension): Level 2 (Red) Row: Theraband;10 reps Theraband Level (Shoulder Row): Level 2 (Red)  Seated Exercises Cervical Rotation: 10 reps Lateral Flexion: 10 reps Shoulder Shrugs: 10 reps    Manual Therapy Manual Therapy: Myofascial release Myofascial Release: Myofascial release (MFR) and manual stretching to LUE bicep, trap, scapular, and cervical regions to decrease fascial restrictions and promote improved ROM with decrease pain.     Occupational Therapy Assessment and Plan OT Assessment and Plan Clinical Impression Statement: pt verbalizing increased pain in B shoulders and neck, rather than just left.  With MFR significantly improved pain in neck.  Contnued AAROM this session.  Increased pain with AAROM this session - encouraged pt to be able to complete 10 reps.  guided pt through cervical neck stretches - pt verbalized good stretch and  encouraged pt to complete at home as she is able.  Added therband extension and row this session. Pt hassome increased pain in L anterior shoulder and bicep regions.  Re-faxed PT order for lower back pain. OT Plan: P:  Follow up on PT order for lower back pain.  continue t-band exercises and cervical neck stretches.   Goals Short Term Goals Short Term Goal 1: Pt will be educated on HEP Short Term Goal 1 Progress: Progressing toward goal Short Term Goal 2: Pt will improve LUE PROM to WNL for improved ability in working towards improved reaching into cabinets Short Term Goal 2 Progress: Progressing toward goal Short Term Goal 3: Pt will have LUE shoulder strenght of atleast 3+/5 in working toward improving abiltiy to go fishing Short Term Goal 3 Progress: Progressing toward goal Short Term Goal 4: Pt will decrease fascial restrictions to min-mod for decreased pain with movement Short Term Goal 4 Progress: Progressing toward goal Short Term Goal 5: Pt will have pain of less than 3/10 at rest. Short Term Goal 5 Progress: Progressing toward goal Long Term Goals Long Term Goal 1: Pt will achieve highest level of functioning in all ADL, IADL, and leisure activities Long Term Goal 1 Progress: Progressing toward goal Long Term Goal 2: Pt will improve LUE seated AROM to [facility] for improved ability to reach into kitchen cabinets Long Term Goal 2 Progress: Progressing toward goal Long Term Goal 3: Pt will have strength in LUE of atleast 4/5 for improved ability to engage in fishing with husband. Long Term Goal 3 Progress: Progressing toward goal Long Term Goal 4: Pt will decrease fascial restrictions to minimal for decreased pain with movement in LUE Long Term Goal 4 Progress: Progressing toward goal Long Term Goal 5: Pt will have pain in LUE of  less than 3/10 during daily activities. Long Term Goal 5 Progress: Progressing toward goal  Problem List Patient Active Problem List   Diagnosis Date Noted  .  Pain in joint, shoulder region 11/22/2013  . Shoulder weakness 11/22/2013  . Diarrhea 05/02/2011  . Generalized weakness 05/02/2011  . Dehydration 05/02/2011  . HTN (hypertension) 05/02/2011  . Hypothyroidism 05/02/2011  . GERD (gastroesophageal reflux disease) 05/02/2011    End of Session Activity Tolerance: Patient tolerated treatment well General Behavior During Therapy: [facility] for tasks assessed/performed  GO   Bea Graff Tasmin Exantus, MS, OTR/L [place] [phone] 12/10/2013, 11:51 AM

## 2013-12-13 ENCOUNTER — Inpatient Hospital Stay (HOSPITAL_COMMUNITY): Admission: RE | Admit: 2013-12-13 | Payer: Medicare Other | Source: Ambulatory Visit

## 2013-12-13 ENCOUNTER — Telehealth (HOSPITAL_COMMUNITY): Payer: Self-pay

## 2013-12-13 ENCOUNTER — Emergency Department: Payer: Self-pay | Admitting: Emergency Medicine

## 2013-12-13 LAB — URINALYSIS, COMPLETE
Bacteria: NONE SEEN
Bilirubin,UR: NEGATIVE
Blood: NEGATIVE
GLUCOSE, UR: NEGATIVE mg/dL (ref 0–75)
Ketone: NEGATIVE
Leukocyte Esterase: NEGATIVE
Nitrite: NEGATIVE
Ph: 7 (ref 4.5–8.0)
Protein: NEGATIVE
RBC,UR: <1 /HPF (ref 0–5)
Specific Gravity: 1.003 (ref 1.003–1.030)
Squamous Epithelial: <1
WBC UR: NONE SEEN /HPF (ref 0–5)

## 2013-12-13 NOTE — Telephone Encounter (Signed)
CX - LEFT A MESSAGE THAT SHE WAS GOING TO THE DR ABOUT HER KIDNEYS

## 2013-12-14 LAB — URINE CULTURE

## 2013-12-15 ENCOUNTER — Ambulatory Visit (HOSPITAL_COMMUNITY): Payer: Medicare Other

## 2013-12-15 ENCOUNTER — Telehealth (HOSPITAL_COMMUNITY): Payer: Self-pay

## 2013-12-15 NOTE — Telephone Encounter (Signed)
Husband called to say to cancel all appointments for now... She is having kidney issues and they will call back when she is ready to resume therapy

## 2013-12-20 ENCOUNTER — Ambulatory Visit (HOSPITAL_COMMUNITY): Payer: Medicare Other

## 2013-12-21 ENCOUNTER — Ambulatory Visit (HOSPITAL_COMMUNITY): Payer: Medicare Other

## 2013-12-21 NOTE — Addendum Note (Signed)
Encounter addended by: Debby Bud, OT on: 12/21/2013 10:04 AM<BR>     Documentation filed: Letters, Clinical Notes, Episodes

## 2013-12-21 NOTE — Progress Notes (Signed)
  Patient Details  Name: Madison Jennings MRN: [id number] Date of Birth: Oct 17, 1940  Today's Date: 12/21/2013  The above patient was discharged from OT on 12/21/2013 secondary to husband called and stated patient is having issues with kidneys. All appointments have been canceled. Will need new order from doctor when patient is ready to continue therapy.   Ailene Ravel, OTR/L,CBIS   12/21/2013, 10:01 AM

## 2013-12-23 ENCOUNTER — Ambulatory Visit (HOSPITAL_COMMUNITY): Payer: Medicare Other

## 2013-12-27 DIAGNOSIS — R1084 Generalized abdominal pain: Secondary | ICD-10-CM | POA: Insufficient documentation

## 2013-12-27 DIAGNOSIS — R300 Dysuria: Secondary | ICD-10-CM | POA: Insufficient documentation

## 2013-12-27 DIAGNOSIS — R351 Nocturia: Secondary | ICD-10-CM | POA: Insufficient documentation

## 2013-12-27 DIAGNOSIS — R1031 Right lower quadrant pain: Secondary | ICD-10-CM | POA: Insufficient documentation

## 2013-12-27 DIAGNOSIS — N302 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: Secondary | ICD-10-CM | POA: Insufficient documentation

## 2013-12-27 DIAGNOSIS — R1032 Left lower quadrant pain: Secondary | ICD-10-CM | POA: Insufficient documentation

## 2013-12-27 DIAGNOSIS — N261 Atrophy of kidney (terminal): Secondary | ICD-10-CM | POA: Insufficient documentation

## 2013-12-27 DIAGNOSIS — N393 Stress incontinence (female) (male): Secondary | ICD-10-CM | POA: Insufficient documentation

## 2014-01-24 ENCOUNTER — Ambulatory Visit: Payer: Self-pay | Admitting: Gastroenterology

## 2014-03-22 DIAGNOSIS — H02822 Cysts of right lower eyelid: Secondary | ICD-10-CM | POA: Diagnosis not present

## 2014-03-22 DIAGNOSIS — K589 Irritable bowel syndrome without diarrhea: Secondary | ICD-10-CM | POA: Diagnosis not present

## 2014-03-22 DIAGNOSIS — K210 Gastro-esophageal reflux disease with esophagitis: Secondary | ICD-10-CM | POA: Diagnosis not present

## 2014-03-22 DIAGNOSIS — H04123 Dry eye syndrome of bilateral lacrimal glands: Secondary | ICD-10-CM | POA: Diagnosis not present

## 2014-03-22 DIAGNOSIS — H5203 Hypermetropia, bilateral: Secondary | ICD-10-CM | POA: Diagnosis not present

## 2014-03-22 DIAGNOSIS — H35033 Hypertensive retinopathy, bilateral: Secondary | ICD-10-CM | POA: Diagnosis not present

## 2014-03-22 DIAGNOSIS — D3142 Benign neoplasm of left ciliary body: Secondary | ICD-10-CM | POA: Diagnosis not present

## 2014-03-22 DIAGNOSIS — H2513 Age-related nuclear cataract, bilateral: Secondary | ICD-10-CM | POA: Diagnosis not present

## 2014-04-18 DIAGNOSIS — D485 Neoplasm of uncertain behavior of skin: Secondary | ICD-10-CM | POA: Diagnosis not present

## 2014-04-18 DIAGNOSIS — L570 Actinic keratosis: Secondary | ICD-10-CM | POA: Diagnosis not present

## 2014-04-20 ENCOUNTER — Emergency Department (HOSPITAL_COMMUNITY)
Admission: EM | Admit: 2014-04-20 | Discharge: 2014-04-20 | Disposition: A | Discharge status: Home / Self Care | Payer: Medicare Other | Attending: Emergency Medicine | Admitting: Emergency Medicine

## 2014-04-20 ENCOUNTER — Emergency Department (HOSPITAL_COMMUNITY): Payer: Medicare Other

## 2014-04-20 ENCOUNTER — Encounter (HOSPITAL_COMMUNITY): Payer: Self-pay | Admitting: Family Medicine

## 2014-04-20 DIAGNOSIS — Z79899 Other long term (current) drug therapy: Secondary | ICD-10-CM | POA: Insufficient documentation

## 2014-04-20 DIAGNOSIS — M545 Low back pain, unspecified: Secondary | ICD-10-CM

## 2014-04-20 DIAGNOSIS — G8929 Other chronic pain: Secondary | ICD-10-CM | POA: Diagnosis not present

## 2014-04-20 DIAGNOSIS — Z791 Long term (current) use of non-steroidal anti-inflammatories (NSAID): Secondary | ICD-10-CM | POA: Insufficient documentation

## 2014-04-20 DIAGNOSIS — E559 Vitamin D deficiency, unspecified: Secondary | ICD-10-CM | POA: Diagnosis not present

## 2014-04-20 DIAGNOSIS — E039 Hypothyroidism, unspecified: Secondary | ICD-10-CM | POA: Insufficient documentation

## 2014-04-20 DIAGNOSIS — M1990 Unspecified osteoarthritis, unspecified site: Secondary | ICD-10-CM | POA: Diagnosis not present

## 2014-04-20 DIAGNOSIS — Z9889 Other specified postprocedural states: Secondary | ICD-10-CM | POA: Insufficient documentation

## 2014-04-20 DIAGNOSIS — K219 Gastro-esophageal reflux disease without esophagitis: Secondary | ICD-10-CM | POA: Diagnosis not present

## 2014-04-20 DIAGNOSIS — I10 Essential (primary) hypertension: Secondary | ICD-10-CM | POA: Insufficient documentation

## 2014-04-20 MED ORDER — ONDANSETRON HCL 4 MG/2ML IJ SOLN
4.0000 mg | Freq: Once | INTRAMUSCULAR | Status: AC
  Administered 2014-04-20: 4 mg via INTRAVENOUS
  Filled 2014-04-20: qty 2

## 2014-04-20 MED ORDER — DIAZEPAM 5 MG PO TABS
5.0000 mg | ORAL_TABLET | Freq: Once | ORAL | Status: AC
  Administered 2014-04-20: 5 mg via ORAL
  Filled 2014-04-20: qty 1

## 2014-04-20 MED ORDER — CYCLOBENZAPRINE HCL 5 MG PO TABS: 5.0000 mg | ORAL_TABLET | Freq: Three times a day (TID) | ORAL | Status: DC | PRN

## 2014-04-20 MED ORDER — MORPHINE SULFATE 4 MG/ML IJ SOLN
4.0000 mg | Freq: Once | INTRAMUSCULAR | Status: AC
  Administered 2014-04-20: 4 mg via INTRAVENOUS
  Filled 2014-04-20: qty 1

## 2014-04-20 MED ORDER — OXYCODONE-ACETAMINOPHEN 5-325 MG PO TABS: 1.0000 | ORAL_TABLET | Freq: Four times a day (QID) | ORAL | Status: DC | PRN

## 2014-04-20 NOTE — ED Notes (Signed)
Pt here for back pain x a few days. Denies injury. sts hx of back problems but way worse,

## 2014-04-20 NOTE — ED Provider Notes (Signed)
CSN: [id number]     Arrival date & time 04/20/14  1359 History   First MD Initiated Contact with Patient 04/20/14 1715     Chief Complaint  Patient presents with  . Back Pain     (Consider location/radiation/quality/duration/timing/severity/associated sxs/prior Treatment) HPI  Pt with hx of chronic back pain presenting with worsening in her low back pain.  Pain is bilateral and is worse on the right side.  Pain is worse with sitting, lying, movement.  Pain is aching.  No fever/chills.  No weakness of legs.  No urinary retention, no incontinence of bowel or bladder.  No numbness.  She has hx of back surgery in the past and also has a nerve stimulator in place but states her pain is now unbearable.  She takes percocet, ibuprofen, neurontin for pain.  There are no other associated systemic symptoms, there are no other alleviating or modifying factors.   Past Medical History  Diagnosis Date  . Chronic back pain   . Hypothyroidism     takes Synthroid daily  . GERD (gastroesophageal reflux disease)     takes Protonix daily  . Hypertension     takes Lisinopril daily  . Hyperlipidemia     just diagnosed;will follow up in 6wks to discuss meds  . Headache(784.0)     sinus related  . Fibromyalgia   . Arthritis   . Joint pain   . Chronic back pain     scoliosis and 2 buldging disc  . Vitamin D deficiency     just placed on Vit D this week  . Diverticulosis   . Nocturia    Past Surgical History  Procedure Laterality Date  . Abdominal hysterectomy    . Cholecystectomy    . Foot surgery Left     d/t neuroma  . Back surgery      x 3  . Radiation to thyroid    . Colonoscopy    . Cardiac catheterization  04/13/2010    normal cororanies, EF 55% ([facility]; Dr. Clayborn Bigness)  . Spinal cord stimulator insertion N/A 07/16/2013    Procedure: LUMBAR SPINAL CORD STIMULATOR INSERTION;  Surgeon: Bonna Gains, MD;  Location: [facility];  Service: Neurosurgery;  Laterality: N/A;   History reviewed. No  pertinent family history. History  Substance Use Topics  . Smoking status: Never Smoker   . Smokeless tobacco: Not on file  . Alcohol Use: No   OB History    No data available     Review of Systems  ROS reviewed and all otherwise negative except for mentioned in HPI    Allergies  Review of patient's allergies indicates no known allergies.  Home Medications   Prior to Admission medications   Medication Sig Start Date End Date Taking? Authorizing Provider  B Complex Vitamins (VITAMIN-B COMPLEX) TABS Take 1 tablet by mouth daily.   Yes Historical Provider, MD  celecoxib (CELEBREX) 200 MG capsule Take 200 mg by mouth 2 (two) times daily as needed for mild pain or moderate pain.    Yes Historical Provider, MD  hydroxypropyl methylcellulose / hypromellose (ISOPTO TEARS / GONIOVISC) 2.5 % ophthalmic solution Place 1 drop into both eyes daily as needed for dry eyes.   Yes Historical Provider, MD  ibuprofen (ADVIL,MOTRIN) 200 MG tablet Take 200 mg by mouth every 6 (six) hours as needed for mild pain.   Yes Historical Provider, MD  levothyroxine (SYNTHROID, LEVOTHROID) 75 MCG tablet Take 75 mcg by mouth daily before breakfast.  Yes Historical Provider, MD  lisinopril (PRINIVIL,ZESTRIL) 40 MG tablet Take 40 mg by mouth daily.   Yes Historical Provider, MD  pantoprazole (PROTONIX) 40 MG tablet Take 40 mg by mouth daily.   Yes Historical Provider, MD  cephALEXin (KEFLEX) 500 MG capsule Take 1 capsule (500 mg total) by mouth 3 (three) times daily. Patient not taking: Reported on 04/20/2014 07/16/13   Bonna Gains, MD  cyclobenzaprine (FLEXERIL) 5 MG tablet Take 1 tablet (5 mg total) by mouth 3 (three) times daily as needed for muscle spasms. 04/20/14   Threasa Beards, MD  oxyCODONE-acetaminophen (PERCOCET/ROXICET) 5-325 MG per tablet Take 1-2 tablets by mouth every 6 (six) hours as needed for severe pain. 04/20/14   Threasa Beards, MD   BP 148/56 mmHg  Pulse 55  Temp(Src) 97 F (36.1 C)   Resp 16  SpO2 100%  Vitals reviewed Physical Exam  Physical Examination: General appearance - alert, well appearing, and in no distress Mental status - alert, oriented to person, place, and time Eyes - no conjunctival injection, no scleral icterus Mouth - mucous membranes moist, pharynx normal without lesions Neck - supple, no significant adenopathy Chest - clear to auscultation, no wheezes, rales or rhonchi, symmetric air entry Heart - normal rate, regular rhythm, normal S1, S2, no murmurs, rubs, clicks or gallops Back exam - full range of motion, no tenderness, palpable spasm or pain on motion Neurological - alert, oriented x 3, no cranial nerve deficit, strength 5/5 in extremities x 4, sensation intact Extremities - peripheral pulses normal, no pedal edema, no clubbing or cyanosis Skin - normal coloration and turgor, no rashes  ED Course  Procedures (including critical care time)  7:49 PM pt is feeling improved after pain meds.  Plan for discharge and followup with her PMD, if pain continues she will need MRI on an outpatient basis.   Labs Review Labs Reviewed - No data to display  Imaging Review Dg Lumbar Spine Complete  04/20/2014   CLINICAL DATA:  Initial encounter for 2 week history of low back pain radiating to both hips with more recent worsening over the last 3 days.  EXAM: LUMBAR SPINE - COMPLETE 4+ VIEW  COMPARISON:  10/21/2013.  FINDINGS: Four views study shows no evidence for an acute fracture. Patient is status post posterior diskectomy and interbody fusion at L4-5 and L5-S1, stable in the interval. The right S1 screw is fractured, stable since CT of 10/08/2012. Spinal stimulator device again noted. Loss of disc height at noted at L2-3 with degenerative changes at the L1-2 level. SI joints are unremarkable.  IMPRESSION: No new or acute bony abnormality.   Electronically Signed   By: Misty Stanley M.D.   On: 04/20/2014 18:45     EKG Interpretation None      MDM    Final diagnoses:  Midline low back pain without sciatica    Pt presenting with worsening of her chronic back pain.  There are no signs or symptoms of cauda equina.  No fever to suggest epidural abscess.  Pt feels improved after meds in the ED.  Xray reassuring.  Discharged with strict return precautions.  Pt agreeable with plan.    Threasa Beards, MD 04/21/14 1901

## 2014-04-20 NOTE — Discharge Instructions (Signed)
Return to the ED with any concerns including weakness of legs, not able to urinate, loss of control of bowel or bladder, fever/chills, decreased level of alertness/lethargy, or any other alarming symptoms °

## 2014-04-21 DIAGNOSIS — M479 Spondylosis, unspecified: Secondary | ICD-10-CM | POA: Diagnosis not present

## 2014-04-21 DIAGNOSIS — E6609 Other obesity due to excess calories: Secondary | ICD-10-CM | POA: Diagnosis not present

## 2014-04-21 DIAGNOSIS — Z6836 Body mass index (BMI) 36.0-36.9, adult: Secondary | ICD-10-CM | POA: Diagnosis not present

## 2014-06-01 DIAGNOSIS — M5136 Other intervertebral disc degeneration, lumbar region: Secondary | ICD-10-CM | POA: Diagnosis not present

## 2014-06-01 DIAGNOSIS — Z6837 Body mass index (BMI) 37.0-37.9, adult: Secondary | ICD-10-CM | POA: Diagnosis not present

## 2014-06-16 DIAGNOSIS — R1032 Left lower quadrant pain: Secondary | ICD-10-CM | POA: Diagnosis not present

## 2014-06-16 DIAGNOSIS — Z6836 Body mass index (BMI) 36.0-36.9, adult: Secondary | ICD-10-CM | POA: Diagnosis not present

## 2014-06-16 DIAGNOSIS — E6609 Other obesity due to excess calories: Secondary | ICD-10-CM | POA: Diagnosis not present

## 2014-06-16 DIAGNOSIS — K5190 Ulcerative colitis, unspecified, without complications: Secondary | ICD-10-CM | POA: Diagnosis not present

## 2014-06-17 NOTE — H&P (Signed)
PATIENT NAME:  Madison Jennings, Madison Jennings MR#:  [id number] DATE OF BIRTH:  03/20/1940  DATE OF ADMISSION:  09/03/2012  REFERRING PHYSICIAN:  Ponciano Ort, MD  PRIMARY CARE PHYSICIAN: Tomasita Morrow, MD   CHIEF COMPLAINT: Shortness of breath.   HISTORY OF PRESENT ILLNESS: The patient is a pleasant 74 year old Caucasian female with a history of chronic back pain, hypertension and GERD. The patient apparently started to have cough, some rhinorrhea and some shortness of breath and wheezing last Thursday. The patient had episodes of feeling hot and cold and had possible subjective fevers. She did not get better. She went to an urgent care center on Monday and was given amoxicillin as well as prednisone. The prednisone was 10 mg every 4 hours for 5 days and the amoxicillin was twice a day dosage. Since then the patient has not felt better and states she actually feels worse. She has no fever, but has a cough which is productive, mostly white with some greenish tinge. She came into the hospital and was noted to be extensively wheezing. She was not febrile nor did she have leukocytosis. She was given stacked nebulizers and hospitalists were contacted for further evaluation and management. Of note, the patient stated that she had a daughter who had cough and kind of a similar symptom but not as severe a couple of weeks ago.   PAST MEDICAL HISTORY: Chronic back pain status post several surgeries, hypertension, GERD, hypothyroidism.   PAST SURGICAL HISTORY: Hysterectomy, back surgery x 3, gallbladder surgery.  FAMILY HISTORY: Diabetes, cancer, and hypertension runs in her family.   SOCIAL HISTORY: She was perhaps a 10 to 15 pack-year smoker, quit more than 40 years ago. No alcohol or drug use. Lives with her husband.   ALLERGIES: No known drug allergies.   OUTPATIENT MEDICATIONS:  1.  Amoxicillin 875 mg 2 times a day, started on Monday.  2.  Levothyroxine 88 mcg daily. 3.  Lisinopril 40 mg daily.  4.  Lyrica 50  mg 1 cap 3 times a day, started a week ago by PCP. 5.  Nexium 40 mg daily. 6.  Prednisone 10 mg 4 times a day for 5 days, started on Monday.   REVIEW OF SYSTEMS:    CONSTITUTIONAL: Positive for subjective fevers and shortness of breath.  EYES: No blurry vision or double vision.  ENT: No tinnitus.  Occasionally has clogged ears and a sore throat from coughing and did have rhinorrhea today.  CARDIOVASCULAR: No chest pain. No swelling in the legs or MI.  Has high blood pressure.  GASTROINTESTINAL: Bout of diarrhea today. No nausea, vomiting or abdominal pain. No hematemesis or melena. No ulcers.  Has GERD. GENITOURINARY: Denies dysuria, hematuria. HEME AND LYMPH:  Denies anemia or easy bruising.  SKIN: No rashes.  MUSCULOSKELETAL: Denies gout. Has chronic back pain.  NEUROLOGIC: Denies numbness, stroke or seizures. PSYCH:  Denies anxiety or insomnia.   PHYSICAL EXAMINATION: VITAL SIGNS: Temperature on arrival 98.2, pulse rate 55, respiratory rate 20, blood pressure 131/76 and O2 sat 94% on room air.  GENERAL: The patient is an obese Caucasian female sitting in bed, talking in full sentences.  HEENT: Normocephalic, atraumatic. Pupils are equal and reactive. Anicteric sclerae. Moist mucous membranes.  NECK: Supple. No thyroid tenderness. No cervical lymphadenopathy. No JVD.  HEART:  S1 and S2, irregularly irregular. No murmurs, rubs or gallops.  LUNGS: Bilateral diffuse wheezing and decreased air entry. No crackles.  ABDOMEN: Soft, nontender and nondistended, obese. Positive bowel sounds in all quadrants.  EXTREMITIES: No significant lower extremity edema.  NEUROLOGIC: Cranial nerves appear to be grossly intact, II to XII.  Strength is 5/5 in all extremities. PSYCH:  Awake, alert and oriented. Cooperative. Pleasant.  LABORATORY AND DIAGNOSTICS:  EKG shows sinus brady with incomplete right bundle branch block and nonspecific ST-T wave changes, but no acute ST elevations or depressions. Rate is  54.   BNP is 279. BUN 14, creatinine 0.88, sodium 137, potassium 5, serum CO2 23. Troponin negative. CK-MB and CK total negative. WBC 10.3, hemoglobin 13.5 and platelets 219.  X-ray of the chest done, not officially read, but to me and the ER physician no obvious infiltrate.   ASSESSMENT AND PLAN: We have a pleasant 74 year old female with a history of chronic back pain, hypertension, hypothyroidism and gastroesophageal reflux disease without history of asthma nor chronic obstructive pulmonary disease who presents with significant shortness of breath, wheezing and cough without significant systemic inflammatory response syndrome criteria and a sick contact. The patient likely has a viral induced bronchospasm. The patient likely also has an acute bronchitis element. There is no fever or systemic inflammatory response syndrome criteria, but she has extensive bilateral wheezing and decreased air movement. She feels somewhat better with the statin and nebulizers. Would admit her to the hospital for failed outpatient therapy with prednisone and amoxicillin. Would start her on IV steroids standing as well as standing nebs every 4 four hours and start her on Levaquin for her acute bronchitis. I would start her also on some Robitussin for cough. I would start her on Levaquin for now, obtain sputum cultures and see how she does symptomatically. She does not have any history of asthma and I doubt this is asthma at this age, although she was a smoker, she smoked about 10 to 15 years and quit more than 40 years ago, and does not have chronic obstructive pulmonary disease. I did discuss the need for possible pulmonary function tests as an outpatient with her PCP once she is discharged. I would continue her blood pressure medications and also check a TSH, resume her Synthroid and consider repeat x-ray of the chest in the morning. Would start her on Lovenox for deep vein thrombosis prophylaxis and continue her proton pump  inhibitor. The patient is FULL CODE.   TOTAL TIME SPENT: 45 minutes. ____________________________ Vivien Presto, MD sa:sb D: 09/03/2012 13:39:38 ET T: 09/03/2012 13:58:34 ET JOB#: [id number]  cc: Vivien Presto, MD, <Dictator> PCP Vivien Presto MD ELECTRONICALLY SIGNED 09/21/2012 14:34

## 2014-06-17 NOTE — Consult Note (Signed)
Chief Complaint:  Subjective/Chief Complaint seen for abdominal apin and rectal bleeding.  minimal bleeding/blood in stool today, some last night.  no abdominal pain or nausea.   VITAL SIGNS/ANCILLARY NOTES: **Vital Signs.:   20-Jul-14 13:32  Vital Signs Type Routine  Temperature Temperature (F) 97.6  Celsius 36.4  Temperature Source AdultAxillary  Pulse Pulse 62  Respirations Respirations 17  Systolic BP Systolic BP 562  Diastolic BP (mmHg) Diastolic BP (mmHg) 45  Mean BP 63  Pulse Ox % Pulse Ox % 99  Pulse Ox Activity Level  At rest  Oxygen Delivery 2L  *Intake and Output.:   20-Jul-14 10:00  Stool  pt had small amount of loose stool with blood in [facility]. Nurse was notified about blood.    10:30  Stool  pt had tiny amount of loose stool in [facility]   Brief Assessment:  Cardiac Regular   Respiratory clear BS   Gastrointestinal details normal Soft  Nontender  Nondistended  No masses palpable  Bowel sounds normal   Lab Results: Routine Chem:  20-Jul-14 05:42   Glucose, Serum  110  BUN 8  Creatinine (comp) 1.11  Sodium, Serum 136  Potassium, Serum 4.3  Chloride, Serum 105  CO2, Serum 27  Calcium (Total), Serum  8.1  Anion Gap  4  Osmolality (calc) 271  eGFR (African American)  58  eGFR (Non-African American)  50 (eGFR values <70m/min/1.73 m2 may be an indication of chronic kidney disease (CKD). Calculated eGFR is useful in patients with stable renal function. The eGFR calculation will not be reliable in acutely ill patients when serum creatinine is changing rapidly. It is not useful in  patients on dialysis. The eGFR calculation may not be applicable to patients at the low and high extremes of body sizes, pregnant women, and vegetarians.)  Routine Hem:  20-Jul-14 05:42   WBC (CBC) 10.6  RBC (CBC) 3.85  Hemoglobin (CBC) 12.1  Hematocrit (CBC) 35.2  Platelet Count (CBC) 166  MCV 91  MCH 31.5  MCHC 34.5  RDW 14.1  Neutrophil % 66.7  Lymphocyte % 20.7  Monocyte  % 11.6  Eosinophil % 0.8  Basophil % 0.2  Neutrophil #  7.1  Lymphocyte # 2.2  Monocyte #  1.2  Eosinophil # 0.1  Basophil # 0.0 (Result(s) reported on 13 Sep 2012 at [facility])   Radiology Results: CT:    20-Jul-14 14:42, CT Abdomen and Pelvis With Contrast  CT Abdomen and Pelvis With Contrast   REASON FOR EXAM:    (1) Abdominal pain, hematochemia; (2) Abdominal pain,   hematochezia.  COMMENTS:       PROCEDURE: CT  - CT ABDOMEN / PELVIS  W  - Sep 13 2012  2:42PM     RESULT: Indication: Abdominal pain    Comparison: None    Technique: Multiple axial images from the lung bases to the symphysis   pubis were obtained with a oral and without intravenous contrast.   Attempts to administer intravenous contrast was made, but the contrast   extravasated in the patient's arm. The referring clinician Dr. SGladstone Lighter  was notified with instructions on management.  Findings:    The lung bases are clear. There is no pleural or pericardial effusions.    No renal, ureteral, or bladder calculi. No obstructive uropathy. No   perinephric stranding is seen. The kidneys are symmetric in size without   evidence for exophytic mass. The bladder is unremarkable.    The liver demonstrates no focal abnormality.  The gallbladder is   unremarkable. The spleen demonstrates no focal abnormality. The adrenal   glands and pancreas are normal.     There is mild bowel wall thickening involving the proximal descending   colon which may be secondary to distention versus mild colitis secondary   to an infectious or inflammatory etiology. There is no pneumoperitoneum,    pneumatosis, or portal venous gas. There is no abdominal or pelvic free   fluid. There is no lymphadenopathy.     The abdominal aorta is normal in caliber with atherosclerosis.    There is posterior lumbar spine fusion.    IMPRESSION:     1. There is mild bowel wall thickening involving the proximal descending   colon which may be  secondary to distention versus mild colitis secondary   to an infectious or inflammatory etiology.     Dictation Site: 1    Verified By: Jennette Banker, M.D., MD   Assessment/Plan:  Assessment/Plan:  Assessment 1) left abdomnal pain and hematochezia-both resolved.  minimal bleeding today, likely old.  CT results noted, c/w ischemic colitis.   Plan 1) will arrange for colonoscopy tomorrow pm.  I have discussed the risks benefits and complicatiosn of colonoscopy to include not limited to bleeding infection perforation and sedation and she wishes to proceed.   Electronic Signatures: Loistine Simas (MD)  (Signed 20-Jul-14 16:22)  Authored: Chief Complaint, VITAL SIGNS/ANCILLARY NOTES, Brief Assessment, Lab Results, Radiology Results, Assessment/Plan   Last Updated: 20-Jul-14 16:22 by Loistine Simas (MD)

## 2014-06-17 NOTE — Consult Note (Signed)
Chief Complaint:  Subjective/Chief Complaint seen for hematochezia and lower adominal pain. some mild increase of lower abdominal  pain with colon prep but tolerated well.  small amount og bright red bleedin with prep.  minimal nausea.   VITAL SIGNS/ANCILLARY NOTES: **Vital Signs.:   21-Jul-14 10:33  Vital Signs Type Q 4hr  Temperature Temperature (F) 98.1  Celsius 36.7  Temperature Source oral  Pulse Pulse 66  Respirations Respirations 21  Systolic BP Systolic BP 678  Diastolic BP (mmHg) Diastolic BP (mmHg) 61  Mean BP 75  Pulse Ox % Pulse Ox % 95  Pulse Ox Activity Level  At rest  Oxygen Delivery Room Air/ 21 %   Brief Assessment:  Cardiac Regular   Respiratory clear BS   Gastrointestinal details normal Soft  Nondistended  No masses palpable  Bowel sounds normal  No rebound tenderness  mild lower abdominal tenderness to palpation mostly llq.   Lab Results: Routine Chem:  21-Jul-14 04:43   BUN  6  Creatinine (comp) 1.04  Sodium, Serum 137  Potassium, Serum 4.0  Chloride, Serum 104  CO2, Serum 29  Calcium (Total), Serum 8.6  Anion Gap  4  Osmolality (calc) 271  eGFR (African American) >60  eGFR (Non-African American)  54 (eGFR values <75m/min/1.73 m2 may be an indication of chronic kidney disease (CKD). Calculated eGFR is useful in patients with stable renal function. The eGFR calculation will not be reliable in acutely ill patients when serum creatinine is changing rapidly. It is not useful in  patients on dialysis. The eGFR calculation may not be applicable to patients at the low and high extremes of body sizes, pregnant women, and vegetarians.)  Routine Coag:  18-Jul-14 11:56   INR 0.9 (INR reference interval applies to patients on anticoagulant therapy. A single INR therapeutic range for coumarins is not optimal for all indications; however, the suggested range for most indications is 2.0 - 3.0. Exceptions to the INR Reference Range may include: Prosthetic  heart valves, acute myocardial infarction, prevention of myocardial infarction, and combinations of aspirin and anticoagulant. The need for a higher or lower target INR must be assessed individually. Reference: The Pharmacology and Management of the Vitamin K  antagonists: the seventh ACCP Conference on Antithrombotic and Thrombolytic Therapy. CLFYBO.1751Sept:126 (3suppl): [id number] A HCT value >55% may artifactually increase the PT.  In one study,  the increase was an average of 25%. Reference:  "Effect on Routine and Special Coagulation Testing Values of Citrate Anticoagulant Adjustment in Patients with High HCT Values." American Journal of Clinical Pathology 2006;126:400-405.)  Routine Hem:  18-Jul-14 11:56   Hemoglobin (CBC) 14.8  19-Jul-14 04:47   Hemoglobin (CBC) 14.2    17:01   Hemoglobin (CBC) 13.0 (Result(s) reported on 12 Sep 2012 at 05:25PM.)  20-Jul-14 05:42   Hemoglobin (CBC) 12.1  Platelet Count (CBC) 166  21-Jul-14 04:43   Hemoglobin (CBC) 12.1 (Result(s) reported on 14 Sep 2012 at 05:41AM.)   Radiology Results: CT:    20-Jul-14 14:42, CT Abdomen and Pelvis With Contrast  CT Abdomen and Pelvis With Contrast   REASON FOR EXAM:    (1) Abdominal pain, hematochemia; (2) Abdominal pain,   hematochezia.  COMMENTS:       PROCEDURE: CT  - CT ABDOMEN / PELVIS  W  - Sep 13 2012  2:42PM     RESULT: Indication: Abdominal pain    Comparison: None    Technique: Multiple axial images from the lung bases to the symphysis   pubis  were obtained with a oral and without intravenous contrast.   Attempts to administer intravenous contrast was made, but the contrast   extravasated in the patient's arm. The referring clinician Dr. [name]   was notified with instructions on management.  Findings:    The lung bases are clear. There is no pleural or pericardial effusions.    No renal, ureteral, or bladder calculi. No obstructive uropathy. No   perinephric stranding is seen. The  kidneys are symmetric in size without   evidence for exophytic mass. The bladder is unremarkable.    The liver demonstrates no focal abnormality. The gallbladder is   unremarkable. The spleen demonstrates no focal abnormality. The adrenal   glands and pancreas are normal.     There is mild bowel wall thickening involving the proximal descending   colon which may be secondary to distention versus mild colitis secondary   to an infectious or inflammatory etiology. There is no pneumoperitoneum,    pneumatosis, or portal venous gas. There is no abdominal or pelvic free   fluid. There is no lymphadenopathy.     The abdominal aorta is normal in caliber with atherosclerosis.    There is posterior lumbar spine fusion.    IMPRESSION:     1. There is mild bowel wall thickening involving the proximal descending   colon which may be secondary to distention versus mild colitis secondary   to an infectious or inflammatory etiology.     Dictation Site: 1    Verified By: Jennette Banker, M.D., MD   Assessment/Plan:  Assessment/Plan:  Assessment 1) llq pain, hematochezia, ct result noted, likely ischemic colitis.  stable labs and hemodynamics.   Plan 1) colonoscopy today.  I have discussed the risks benefits and complications o fo colonoscopy to include not limited to bleeding infection perforation and sedation and she wishes to proceed.  Further recs to follow.   Electronic Signatures: Loistine Simas (MD)  (Signed 21-Jul-14 16:44)  Authored: Chief Complaint, VITAL SIGNS/ANCILLARY NOTES, Brief Assessment, Lab Results, Radiology Results, Assessment/Plan   Last Updated: 21-Jul-14 16:44 by Loistine Simas (MD)

## 2014-06-17 NOTE — Consult Note (Signed)
PATIENT NAME:  Madison Jennings, Madison Jennings MR#:  [id number] DATE OF BIRTH:  1940-03-12  DATE OF CONSULTATION:  09/12/2012  CONSULTING PHYSICIAN:  Lollie Sails, MD  PHYSICIAN: Grasston Sink, MD  REASON FOR CONSULTATION: GI bleeding.   HISTORY OF PRESENT ILLNESS: Madison Jennings is a pleasant 74 year old female who came to the hospital yesterday with complaint of abdominal pain and rectal bleeding. She was in the hospital being treated for exacerbation of asthma/reactive airway disease/acute bronchitis from 09/03/2012 through 09/05/2012. She was still feeling somewhat weak when she went home, and she completed her course of antibiotics 2 days ago as an outpatient. She states that she awoke yesterday morning with some left lower quadrant crampiness. This was followed by weakness, emesis and a loose bowel movement, looser than usual. She has loose bowel movements more so than formed on a regular basis. She said she saw some blood with his stool after the cramping. The blood was seen with the stool about 2 hours after the initial cramping had started. The patient has since that time been feeling somewhat better today than yesterday with much less abdominal discomfort. She continues to have some rectal bleeding, however, this being seen on her last bowel movement about an hour or so ago.   As an outpatient she does take Nexium, has for many years, and will occasionally get some epigastric bloating. She denies any heartburn or dysphagia. She takes no aspirins or NSAIDs. She has no personal history of peptic ulcer disease. She did have an EGD and colonoscopy done in April 2013. Her EGD was done for heartburn and suspected reflux. The examination was normal. Her colonoscopy, which was done on the same day for generalized abdominal pain and constipation, was stated to be normal as well. There is a comment that she may have adhesions from various abdominal surgeries, as it was hard to push the scope into the colon.  Recommendation was for a 10-year repeat colonoscopy.   The patient's pain has decreased since yesterday. She continues to have some brighter red-colored rectal bleeding. She has been hemodynamically stable.   GASTROINTESTINAL FAMILY HISTORY: Negative for colorectal cancer, liver disease or ulcers.   PAST MEDICAL HISTORY: The patient has a history of hypothyroidism, gastroesophageal reflux, hypertension, chronic back pain and peripheral neuropathy. She has a history of a hysterectomy, back surgery x 3 as well as cholecystectomy.   SOCIAL HISTORY: She quit smoking about 4 years ago and did smoke some for multiple years. She does not take any NSAIDs. She does not use any alcohol.   ALLERGIES: There is no known drug allergy.   OUTPATIENT MEDICATIONS: Include gabapentin 600 mg one 3 times a day, levothyroxine 88 mcg daily, lisinopril 40 mg a day, Nexium 40 mg once a day, ProAir HFA 2 puffs inhaled 4 times a day. Again, she just recently completed a course of antibiotics.   PHYSICAL EXAMINATION: VITAL SIGNS: Temperature is 98.3, pulse 57, respirations 17, blood pressure 112/68, pulse oximetry 95%.  GENERAL: She is a 74 year old Caucasian female, anxious, no acute distress.  HEENT: Normocephalic, atraumatic. Eyes are anicteric. Nose: Septum midline. No lesions. Oropharynx: No lesions.  NECK: Supple. No JVD.  HEART: Regular rate and rhythm.  LUNGS: Bilaterally clear.  ABDOMEN: Soft, obese, nontender, nondistended. Bowel sounds are positive, normoactive. There did not seem to be any bruit noted. There are no masses or rebound.  RECTAL: Anorectal examination shows a watery, bright red/bloody effluent without stool in the in the vault itself.  EXTREMITIES: No  clubbing, cyanosis or edema.  NEUROLOGICAL: Cranial nerves II through XII grossly intact. Muscle strength bilaterally equal and symmetric. DTRs bilaterally equal and symmetric.   RADIOLOGICAL DATA: She had a CT scan of the abdomen and pelvis  without contrast showing: 1.  The impression of partial small bowel obstruction or focal ileus in the proximal small bowel with a transition area appearing to be possibly with some wall thickening and a loop of bowel in left mid abdominal region medial to the colon.  2.  Status post cholecystectomy.  3.  Previous lumbar spine surgery.  4.  Lung base atelectasis bilaterally.   Again, it is of note that this CT scan was done without contrast.   LABORATORY DATA: Include the following: On admission to the hospital she had a glucose of 105, BUN 22, creatinine 1.41, sodium 140, potassium 4.1, chloride 107, bicarbonate 26, calcium 9.0, lipase 144. Hepatic profile showed alkaline phosphatase increased at 139, otherwise normal total protein, albumin, bilirubin, AST and ALT. Her hemogram on admission showed a white count of 20.4, hemoglobin and hematocrit 14.8 and 44.6, platelet count 223, MCV 92. She had a urinalysis which was normal.   Repeat laboratories from this morning show her BUN at 16, creatinine 1.19, sodium 137, potassium 4.7, chloride 107. Her GFR actually improved today to 46 from 38 yesterday. Magnesium was 1.9. Her hepatic profile today showed a normal alkaline phosphatase at 120. Her white count has decreased some to 17.5. Hemoglobin and hematocrit are 14.2 and 43.1.   ASSESSMENT: Acute-onset left lower quadrant pain with rectal bleeding. This was brighter red in nature, not black or dark maroon as would be seen in a more proximal bleed than the colon itself. She continues to have some bright red blood in the rectal vault. However, there has not been appreciable drop of hemoglobin overnight. It is of note that her creatinine has improved since yesterday. Her abdominal pain is also much better than yesterday. My impression is that of an episode of ischemic colitis. The CT scan was done without contrast and I am unable to ascertain thicknesses of the walls of the colon in the left side of the colon.  The colonic wall thicknesses appear to be normal in areas of air-filled colon in the transverse region. Part of the differential would be vascular defect intraluminally with bleeding. It is of note that her hemoglobin has had only a mild decline overnight.   RECOMMENDATIONS: 1.  Continue serial hemoglobins. Transfuse as needed.  2.  Maintain adequate IV hydration, as the initial insult could have been some amount of dehydration.  3.  Luminal evaluation via colonoscopy. Will arrange for this on Monday. Currently the patient is stable hemodynamically and by laboratory evaluation. Will follow.  Thank you for this consult.   ____________________________ Lollie Sails, MD mus:jm D: 09/12/2012 16:07:57 ET T: 09/12/2012 16:40:49 ET JOB#: [id number]  cc: Lollie Sails, MD, <Dictator> Lollie Sails MD ELECTRONICALLY SIGNED 09/21/2012 7:17

## 2014-06-17 NOTE — Consult Note (Signed)
Brief Consult Note: Diagnosis: hematochezia, llq abdominal pain.   Patient was seen by consultant.   Consult note dictated.   Recommend to proceed with surgery or procedure.   Recommend further assessment or treatment.   Comments: Patient seen and examined, full GI consult dictated [phone].  Patient admitted to hospital after acute onset episode of llq pain followed by bright red rectal bleeding about 2 hours later.  Currently pain is much improved, and although there is still bright red bloody effluent in the rectum, no large drop of hgb overnight.   CT scan of limited help due to non-contrast.  My impression is an ischemic event, likely ischemic colitis, DDx to include atypical bleeding such as colonic avm, or dieulafoy lesion, although these would not cause abdominl pain other than cramping  with blood passage.  Hemodynamically stable.  Recommend luminal evaluation, will arrange colonoscopy for monday.  It may be informative to repeat the CT with contrast since the renal function has improved with hydration and this can be contrasted both iv and oral.  Will recheck hgb this evening.  Thank you for this consult.  Electronic Signatures: Loistine Simas (MD)  (Signed 19-Jul-14 16:17)  Authored: Brief Consult Note   Last Updated: 19-Jul-14 16:17 by Loistine Simas (MD)

## 2014-06-17 NOTE — Consult Note (Signed)
Brief Consult Note: Diagnosis: abd pain.   Patient was seen by consultant.   Consult note dictated.   Recommend further assessment or treatment.   Discussed with Attending MD.   Comments: Hx of pain, bloody stool with benign exam, equivocal CT. Needs further w/u. Discussed with ER MD. Will follow and reexam, may need GI consult for endoscopy. recent abx and steroid exposure.  Electronic Signatures: Florene Glen (MD)  (Signed 18-Jul-14 15:25)  Authored: Brief Consult Note   Last Updated: 18-Jul-14 15:25 by Florene Glen (MD)

## 2014-06-17 NOTE — Consult Note (Signed)
PATIENT NAME:  Madison Jennings, Madison Jennings MR#:  [id number] DATE OF BIRTH:  1940/03/19  DATE OF CONSULTATION:  09/11/2012  CHIEF COMPLAINT: Abdominal pain.   HISTORY OF PRESENT ILLNESS: This is a patient who was in the hospital last week with asthmatic bronchitis who has exposure to antibiotics and steroids who started at 8:00 a.m. this morning with abdominal pain. It came on very suddenly, was crampy in nature and caused her such pain that she states she almost passed out. Her pain is much improved at this point. She has been medicated, but her pain is nearly gone. She had some nausea, but no emesis. She had a bowel movement that she states was dark blood not bright red blood.  She has never had an episode like this before. Denies fevers or chills and states that her pain is much improved at this point. She also states she has been passing gas.   PAST MEDICAL HISTORY: Asthmatic bronchitis with recent admission, chronic back pain,  hypertension, reflux disease and thyroid disease.   PAST SURGICAL HISTORY: Hysterectomy, not sure if 1 or 2 ovaries were removed.  She has had multiple back surgeries and has had a cholecystectomy.   FAMILY HISTORY: Noncontributory.   SOCIAL HISTORY: The patient does not smoke or drink and is not employed.   REVIEW OF SYSTEMS:  A 10 system review is performed and negative with the exception of that mentioned in the history of present illness. She does, however, have a cough but no hematuria.   PHYSICAL EXAMINATION:  GENERAL: Healthy, fairly comfortable-appearing, Caucasian, female patient.   VITAL SIGNS: Temperature 97.8, pulse of 87, respirations 22, blood pressure 103/50, 92% room air sat. This is marked on a pain scale of 7, but she told me her pain was much improved.   HEENT: No scleral icterus.   NECK: No palpable neck nodes.   CHEST: Clear to auscultation bilaterally.   CARDIAC: Regular rate and rhythm.   ABDOMEN: Soft, nondistended, scars are noted. There is no  guarding. No rebound. No percussion tenderness. No localization of tenderness. Minimal tenderness in the left lower quadrant, if any.   EXTREMITIES: Showing moderate edema.   NEUROLOGIC: Grossly intact.   INTEGUMENT: No jaundice.   LABORATORY, DIAGNOSTIC, AND RADIOLOGICAL DATA: CT scan is personally reviewed. There is some dilated proximal small bowel. There is gas in the colon and rectum. White blood cell count is 20.4, hemoglobin and hematocrit of 14.8 and 44.6, platelet count of 223. Lipase 144, creatinine 1.4, BUN of 22. Electrolytes are within normal limits. CO2 is 26. Coags are within normal limits. Urinalysis is clean.   ASSESSMENT AND PLAN: This is a patient with, what was read as, a possible an ileus versus partial small bowel obstruction on CT scan. It is not necessarily consistent with her history and physical, however. Her chief complaint was abdominal pain and bloody stools. She is hemodynamically stable so that the bloody stools are not significant from a hemodynamic standpoint at this time. She is not vomiting. She is passing gas and does not appear to be obstructed.  On exam, it is essentially nontender.   My concern in patient such as this is that one of ischemia, which I do not see obvious signs of and in fact, her bicarbonate was normal. She feels fairly comfortable at this point, but she needs further workup and with a history of reflux disease and dark stools one must consider that this may be gastric in origin. I have asked the Emergency  Room physician to have internal medicine see her again and probably admit her and obtain GI consultation appropriately if needed and I would be happy to follow the patient and re-examine her later.   ____________________________ Jerrol Banana. Burt Knack, MD rec:rw D: 09/11/2012 15:32:29 ET T: 09/11/2012 15:44:03 ET JOB#: [id number]  cc: Jerrol Banana. Burt Knack, MD, <Dictator> Florene Glen MD ELECTRONICALLY SIGNED 09/12/2012 7:04

## 2014-06-17 NOTE — Discharge Summary (Signed)
PATIENT NAME:  Madison Jennings, Madison Jennings MR#:  [id number] DATE OF BIRTH:  [birth year]  DATE OF ADMISSION:  09/14/2012 DATE OF DISCHARGE:  09/16/2012  PRIMARY CARE PHYSICIAN:  Dr. Tomasita Morrow.  DISCHARGE DIAGNOSES:  Questionably ischemic colitis, gastrointestinal bleeding.   PROCEDURE:  Colonoscopy showing questionable ischemic colitis.   CONDITION:  Stable.   CODE STATUS:  FULL CODE.   HOME MEDICATIONS:  Lisinopril 40 mg by mouth daily, levothyroxine 88 mcg by mouth daily, ProAir HFA CFC free 90 mcg inhalation aerosol 2 puffs 4 times a day as needed, gabapentin 600 mg by mouth 3 times daily, mesalamine 400 mg 3 times daily, Protonix 40 mg by mouth twice daily.   DIET:  Low sodium, low residual diet.   ACTIVITY:  As tolerated.   FOLLOW-UP CARE:  Follow with PCP within 1 to 2 weeks.  Follow up with Dr. Gustavo Lah within one week.  The patient may need a repeat abdominal CAT scan with contrast as outpatient.   REASON FOR ADMISSION:  Gastrointestinal bleeding.   HOSPITAL COURSE:  The patient is a 74 year old female with a history of hypertension, GERD, peripheral neuropathy, came to the ED due to very dark stools with bright blood on the toilet paper.  In addition, the patient has numbness and dizziness like she was about to pass out.  For a detailed history and physical examination, please refer to the admission note dictated by Dr. Laurin Coder.  Laboratory data on admission date showed creatinine 1.41, sodium 140, potassium 4.1.  WBC 20,000 secondary to steroid use.  Hemoglobin 14.8.  INR 0.9.  Urinalysis negative.  CAT scan of the abdomen and pelvis showed partial small bowel obstruction or focal ileus.   GI bleeding.  After admission the patient was kept nothing by mouth with a Protonix drip.  Dr. Burt Knack saw the patient.  He did not think the patient has a significant obstruction, but concern of ischemia and Dr. Gustavo Lah did a colonoscopy showing questionable ischemic colitis, so Dr. Gustavo Lah suggested  continue Protonix twice daily and add mesalamine 400 mg by mouth 3 times daily.  The patient tolerated a full liquid and a low residual diet.  According to Dr. Gustavo Lah, patient can follow up him as outpatient.  May need a repeat abdominal CAT scan with contrast and follow up him for colonoscopy biopsy.  The patient has no abdominal pain and tolerated diet well.  The patient has no GI bleeding since admission.  She is clinically stable, will be discharged to home today.  Discussed the patient's discharge plan with the patient, the patient's husband and case Freight forwarder.   TIME SPENT:  About 36 minutes.    ____________________________ Demetrios Loll, MD qc:ea D: 09/16/2012 16:41:00 ET T: 09/16/2012 19:24:29 ET JOB#: [id number]  cc: Demetrios Loll, MD, <Dictator> Demetrios Loll MD ELECTRONICALLY SIGNED 09/17/2012 15:25

## 2014-06-17 NOTE — Consult Note (Signed)
Chief Complaint:  Subjective/Chief Complaint seen for abdominalpain/hematochezia.  no further rectal bleeding, abdominal pain 0/10 currently.  tolerating full liquids.   VITAL SIGNS/ANCILLARY NOTES: **Vital Signs.:   22-Jul-14 13:51  Vital Signs Type Routine  Temperature Temperature (F) 98  Celsius 36.6  Temperature Source oral  Pulse Pulse 58  Respirations Respirations 20  Systolic BP Systolic BP 956  Diastolic BP (mmHg) Diastolic BP (mmHg) 66  Mean BP 87  Pulse Ox % Pulse Ox % 95  Pulse Ox Activity Level  At rest  Oxygen Delivery Room Air/ 21 %  *Intake and Output.:   22-Jul-14 07:29  Stool  1 small liquidy   Brief Assessment:  Cardiac Regular   Respiratory clear BS   Gastrointestinal details normal Soft  Nontender  Nondistended  No masses palpable  Bowel sounds normal   Lab Results: Routine Chem:  21-Jul-14 04:43   Glucose, Serum 98  BUN  6  Creatinine (comp) 1.04  Sodium, Serum 137  Potassium, Serum 4.0  Chloride, Serum 104  CO2, Serum 29  Calcium (Total), Serum 8.6  Anion Gap  4  Osmolality (calc) 271  eGFR (African American) >60  eGFR (Non-African American)  54 (eGFR values <49m/min/1.73 m2 may be an indication of chronic kidney disease (CKD). Calculated eGFR is useful in patients with stable renal function. The eGFR calculation will not be reliable in acutely ill patients when serum creatinine is changing rapidly. It is not useful in  patients on dialysis. The eGFR calculation may not be applicable to patients at the low and high extremes of body sizes, pregnant women, and vegetarians.)  Routine Hem:  21-Jul-14 04:43   Hemoglobin (CBC) 12.1 (Result(s) reported on 14 Sep 2012 at 05:41AM.)   Radiology Results: CT:    20-Jul-14 14:42, CT Abdomen and Pelvis With Contrast  CT Abdomen and Pelvis With Contrast   REASON FOR EXAM:    (1) Abdominal pain, hematochemia; (2) Abdominal pain,   hematochezia.  COMMENTS:       PROCEDURE: CT  - CT ABDOMEN /  PELVIS  W  - Sep 13 2012  2:42PM     RESULT: Indication: Abdominal pain    Comparison: None    Technique: Multiple axial images from the lung bases to the symphysis   pubis were obtained with a oral and without intravenous contrast.   Attempts to administer intravenous contrast was made, but the contrast   extravasated in the patient's arm. The referring clinician Dr. SGladstone Lighter  was notified with instructions on management.  Findings:    The lung bases are clear. There is no pleural or pericardial effusions.    No renal, ureteral, or bladder calculi. No obstructive uropathy. No   perinephric stranding is seen. The kidneys are symmetric in size without   evidence for exophytic mass. The bladder is unremarkable.    The liver demonstrates no focal abnormality. The gallbladder is   unremarkable. The spleen demonstrates no focal abnormality. The adrenal   glands and pancreas are normal.     There is mild bowel wall thickening involving the proximal descending   colon which may be secondary to distention versus mild colitis secondary   to an infectious or inflammatory etiology. There is no pneumoperitoneum,    pneumatosis, or portal venous gas. There is no abdominal or pelvic free   fluid. There is no lymphadenopathy.     The abdominal aorta is normal in caliber with atherosclerosis.    There is posterior lumbar spine fusion.  IMPRESSION:     1. There is mild bowel wall thickening involving the proximal descending   colon which may be secondary to distention versus mild colitis secondary   to an infectious or inflammatory etiology.     Dictation Site: 1    Verified By: Jennette Banker, M.D., MD   Assessment/Plan:  Assessment/Plan:  Assessment 1) hematochezia. left abdominalpain-probable episode of ischemic colitis.  stable, not recurrent.   Plan 1) awaiting path report on colon biopsies-will be back tomorrow.  Will order multiple labs, may need CT abd with iv contrast/   Will need repeat colonoscopy as o/p in 6 weeks.  Now on mesalamine.  Will need o/p GI fu in 2 weeks.   Electronic Signatures: Loistine Simas (MD)  (Signed 22-Jul-14 18:11)  Authored: Chief Complaint, VITAL SIGNS/ANCILLARY NOTES, Brief Assessment, Lab Results, Radiology Results, Assessment/Plan   Last Updated: 22-Jul-14 18:11 by Loistine Simas (MD)

## 2014-06-17 NOTE — H&P (Signed)
PATIENT NAME:  Madison Jennings, Madison Jennings MR#:  [id number] DATE OF BIRTH:  08/01/1940  DATE OF ADMISSION:  09/11/2012  REASON FOR ADMISSION: GI bleeding.   PRIMARY CARE PHYSICIAN: Tomasita Morrow   REFERRING PHYSICIAN:  Lisa Roca, M.D.   HISTORY OF PRESENT ILLNESS: This is a very nice 74 year old female with history of being admitted here in the hospital on 09/03/2012 and discharged on 09/05/2012 due to exacerbation of asthma. Reactive airway disease and acute bronchitis.   The patient actually went home. She is taking some steroids, now she is starting to feel a little bit better. Although today, this morning, she was feeling very weak, dizzy and lightheaded. She states that she was in really bad shape. She got up, went to the commode, had one bowel movement and then went back home and had a second bowel movement. She did not see the first one, but the second one she states was at 9:00 a.m. and she had very dark stools with bright blood on the toilet paper and on top of the stool and around the commode. The patient was feeling very numb and after the second bowel movement and dizzy and she felt like she was about to pass out, but then she went back to bed and called the family.   The patient states that she has never had this happen before. Dizziness that lasted through a whole day and was worse with getting up and moving around, better with resting. The patient states that she had a headache today that she had not had before, intensity of 2 to 3 out of 10, dull pain. No radiation. No visual abnormalities.   The patient has been receiving course of antibiotics and steroids for her acute bronchitis and is starting to improve, but not resolved.   The patient is evaluated here at the ER. She had a rectal exam that shows positive guaiac, dark stools and some fresh blood.   The patient was admitted with a Protonix drip.    REVIEW OF SYSTEMS: CONSTITUTIONAL: Positive fatigue. Positive weakness. No weight loss  or weight gain.   EYES: No blurry vision, double vision, pain or redness.   EARS, NOSE, THROAT: No difficulty swallowing. Positive nasal congestion. No ear pain.   RESPIRATORY: Positive cough. Positive wheezing. No hemoptysis. Positive dyspnea. Positive asthma and no painful respirations.   CARDIOVASCULAR: No chest pain, orthopnea, syncope or palpitations.   GASTROINTESTINAL: No vomiting, but positive for continuous nausea. No diarrhea. Some left upper quadrant abdominal pain mostly related to the cough. No significant jaundice. Positive GERD.   GENITOURINARY: No dysuria, hematuria, or changes in frequency.   GYNECOLOGIC: No breast masses.   ENDOCRINE: No polyuria previously, polyphagia, cold or heat intolerance. Thyroid: No cold or heat intolerance.   HEMATOLOGIC AND LYMPHATIC: No significant anemia, easy bruising or previous bleeding. No swollen glands.   SKIN: No rashes, lesions or moles.   MUSCULOSKELETAL: No significant neck, back pain or gout.   NEUROLOGIC: No numbness, tingling at this moment. She had numbness after a bowel movement, on the whole body, but it resolved. No transient ischemic attack or CVA.   PSYCHIATRIC: No insomnia or depression.   PAST MEDICAL HISTORY: 1. Chronic back pain.  2. Hypertension.  3. GERD.  4. Hypothyroidism.  5. Peripheral neuropathy.   PAST SURGICAL HISTORY: 1. Hysterectomy.  2. Back surgery x 3.  3. Cholecystectomy.   FAMILY HISTORY: Positive for hypertension and diabetes. The patient states that cancer runs in her family, but she  does not know exactly which kind. No coronary artery disease.   SOCIAL HISTORY: The patient used to smoke 10 to 15 packs a year, but quit 4 years ago. She does not drink. She lives with her husband. She does not take any NSAIDs. She took aspirin two 81 mg of aspirin 2 days ago.   ALLERGIES: Not known drug allergies.   CURRENT MEDICATIONS:  1. ProAir HFA 90 two puffs 4 times a day. 2. Nexium 40 mg once a  day.  3. Lisinopril 40 mg once a day.  4. Levothyroxine 88 mg once daily.  5. The patient was discharged on taper of prednisone and levofloxacin, which is finished.   PHYSICAL EXAMINATION: VITAL SIGNS: Blood pressure 102/50, pulse of 87, respirations 22, temperature 97.8, oxygen saturation 92% on 2 liters of oxygen. The patient is alert, oriented x3, in no acute distress. No respiratory distress. Hemodynamically stable.   HEENT: Pupils are equal and reactive. Extraocular movements are intact. Mucosa are moist. Anicteric sclerae. Pink conjunctivae. No oral lesions. No oropharyngeal exudates.    NECK: Supple. No JVD. No thyromegaly. No adenopathy. No carotid bruits. No rigidity.   CARDIOVASCULAR: Regular rate and rhythm. No murmurs, rubs or gallops. No tenderness to the chest wall. No displacement of PMI.   LUNGS: Clear without any wheezing or crepitus. No use of accessory muscles. No rhonchi. No wheezing at this moment.   ABDOMEN: Soft, nondistended. No hepatosplenomegaly. No masses. Bowel sounds are positive. There is mild tenderness to palpation of left rib cage lateral area.   GYNECOLOGIC: Negative for breast masses genital exam is negative for external rashes or lesions.   EXTREMITIES: No edema, cyanosis or clubbing. Pulses +2. Capillary refill less than 3.   NEUROLOGIC: Cranial nerves II through XII intact. Strength is 5/5 in four extremities.   LYMPHATIC: Negative for lymphadenopathy in neck or supraclavicular areas.   SKIN: No rashes, petechiae or diaphoresis.   PSYCHIATRIC: Negative for altered mental status. Normal judgment. Alert, oriented x 3 and no agitation.   MUSCULOSKELETAL: No significant joint effusions or joint abnormalities.   LABORATORY AND RADIOLOGICAL DATA: Her creatinine is 1.41, which is elevated from baseline which is 0.88, sodium 140, potassium 4.1, alkaline phosphatase 139, and other LFTs within normal limits. White count is 20,000 secondary to steroid use,  hemoglobin 14.8, hemoglobin at discharge last time was 13.7, platelet count is 223. INR is 0.9. Urinalysis negative for signs of infection. EKG done last week, normal sinus rhythm. CT of the abdomen and pelvis shows partial small bowel obstruction or focal ileus in the proximal of the small bowel with transfusion area appearing to be present, possibly with some wall thickening in a loop of small bowl in the left mid abdominal region medial to colon correlate clinically.  There is atelectasis on both bases of the lungs.    ASSESSMENT AND PLAN: A 74 year old female with history of acute bronchitis/asthma/reactive airway disease recently admitted, a history of back pain, several surgeries, hypertension, gastroesophageal reflux disease and hypothyroidism, comes with gastrointestinal bleeding.  1. Gastrointestinal bleeding patient may have upper and lower gastrointestinal bleeding.  The upper likely secondary to steroids use, gastritis maybe. We are going to keep her n.p.o. and start her on Protonix drip. There is some changes in her CT scan that showed a proximal small intestine who has some thickening and possible obstruction, could be a peptic ulcer. We are going to continue Protonix, keep her n.p.o. She has been evaluated by general surgery, Dr. Burt Knack, who  does not think that she has a significant obstruction. The patient is not vomiting and she is passing gas for what clinically does not seem to be obstructed. Concerns of ischemia, but the patient was not having any obvious significant pain or signs. Dr. Burt Knack feels like she might just need a gastrointestinal evaluation and he is going to watch her closely.  2. Gastrointestinal consulted and her hemoglobin is actually higher than before.  3. Acute kidney injury area or kidney failure. Her creatinine is a little bit worse than it was last time over here and at that point it was actually higher than previous.  This could be related to acute illness. Maybe the  patient is not hydrating herself very well. We are going to keep her on IV fluids of 75 mL an hour. Monitor creatinine.  4. Hypertension: At this moment, her blood pressure is on the low side 103/50. The patient is dizzy and lightheaded. We are going to hold on blood pressure medications.  5. Acute bronchitis. Actually is now resolved or improved. Patient is off her antibiotics and steroids. We are going to provide p.r.n. inhalers and nebulizers.  6. Hypothyroidism. Continue Levothroid. Gastrointestinal prophylaxis. The patient is on a Protonix drip. Deep vein thrombosis prophylaxis, as the patient has gastrointestinal  bleeding, we are going to do only mechanical devices like sequential compression devices. 7. For her acute kidney injury, we are going to avoid nephrotoxins. Stop lisinopril for now and since the patient took aspirin, this could be the trigger for her gastrointestinal bleeding. Consider also possibility of the patient having internal hemorrhoids and/or diverticulosis.   The patient is a full code.   TIME SPENT: I spent about 45 minutes with this patient.   ____________________________ McMullin Sink, MD rsg:rw D: 09/11/2012 36:06:77 ET T: 09/11/2012 17:44:13 ET JOB#: [id number]  cc: [place] Sink, MD, <Dictator> [place]on [place] Brown MD ELECTRONICALLY SIGNED 09/18/2012 20:49

## 2014-06-17 NOTE — Discharge Summary (Signed)
PATIENT NAME:  Madison Jennings, ACKLEY MR#:  [id number] DATE OF BIRTH:  [birth year]  DATE OF ADMISSION:  09/03/2012 DATE OF DISCHARGE:  09/05/2012  For a detailed note, please take a look at the history and physical done on admission by Dr. Bridgette Habermann.     DIAGNOSES AT DISCHARGE: 1.  Asthmatic bronchitis.  2.  Hypertension.  3.  Hypothyroidism.  4.  Neuropathy.  5.  Gastroesophageal reflux disease.   DIET: The patient is being discharged on a low-sodium diet.   ACTIVITY: As tolerated.   FOLLOWUP:  With  Dr. Tomasita Morrow from the [facility] in the next 1 to 2 weeks.   DISCHARGE MEDICATIONS:  Are as follows:  Nexium 40 mg daily, lisinopril 40 mg daily, Synthroid 88 mcg daily, Lyrica 50 mg t.i.d., Levaquin 250 mg q. 24 hours x5 days. Albuterol inhaler 2 puffs q.i.d. as needed for shortness of breath or wheezing; and prednisone taper starting at 50 mg, down to 10 mg over the next 5 days.   PERTINENT STUDIES DONE DURING THE HOSPITAL COURSE: Are as follows: A chest x-ray done on admission showing no acute cardiopulmonary disease. A blood culture to be negative.   BRIEF HOSPITAL COURSE: This is a 74 year old female with medical problems as mentioned above, presented to the hospital with shortness of breath, cough, wheezing, and having failed oral antibiotic therapy for acute bronchitis.  1.  Asthmatic bronchitis. The patient has failed outpatient therapy with oral antibiotics and a prednisone taper. Therefore, was admitted to the hospital, started on IV steroids, also empirically started on Levaquin oral. Chest x-ray did not show any evidence of acute pneumonia. She was also placed on nebulizer treatments to q.6 hours around the clock. Over the next 24 to 48 hours, the patient's clinical symptoms have significantly improved. Her steroids are being tapered presently to an oral prednisone taper. She is also being discharged on oral Levaquin dose for the next few days, along with albuterol inhaler as needed for  wheezing and shortness of breath. She was ambulated on room air and did not desaturate.  2.  Hypertension. The patient remained hemodynamically stable on her lisinopril. She will resume that.  3.  Hypothyroidism. The patient was maintained on her Synthroid. She will resume that. 4.  Gastroesophageal reflux disease. The patient was maintained on Nexium and she will also resume that.  5.  Neuropathy. The patient was maintained on Lyrica and she will resume that upon discharge.   THE PATIENT IS A FULL CODE.   TIME SPENT: 35 minutes.   ____________________________ Belia Heman. Verdell Carmine, MD vjs:dp D: 09/05/2012 12:14:00 ET T: 09/05/2012 13:01:51 ET JOB#: [id number]  cc: Myrle Sheng. Jimmye Norman, MD [place] Verdell Carmine, MD, <Dictator>   Henreitta Leber MD ELECTRONICALLY SIGNED 09/07/2012 20:43

## 2014-06-20 DIAGNOSIS — M545 Low back pain: Secondary | ICD-10-CM | POA: Diagnosis not present

## 2014-06-20 DIAGNOSIS — M5417 Radiculopathy, lumbosacral region: Secondary | ICD-10-CM | POA: Diagnosis not present

## 2014-06-20 DIAGNOSIS — M961 Postlaminectomy syndrome, not elsewhere classified: Secondary | ICD-10-CM | POA: Diagnosis not present

## 2014-06-20 DIAGNOSIS — Z79891 Long term (current) use of opiate analgesic: Secondary | ICD-10-CM | POA: Diagnosis not present

## 2014-06-20 DIAGNOSIS — F1920 Other psychoactive substance dependence, uncomplicated: Secondary | ICD-10-CM | POA: Diagnosis not present

## 2014-06-20 LAB — SURGICAL PATHOLOGY

## 2014-06-29 ENCOUNTER — Other Ambulatory Visit: Payer: Self-pay | Admitting: Family Medicine

## 2014-06-29 DIAGNOSIS — Z1231 Encounter for screening mammogram for malignant neoplasm of breast: Secondary | ICD-10-CM

## 2014-07-08 DIAGNOSIS — Z6836 Body mass index (BMI) 36.0-36.9, adult: Secondary | ICD-10-CM | POA: Diagnosis not present

## 2014-07-08 DIAGNOSIS — R109 Unspecified abdominal pain: Secondary | ICD-10-CM | POA: Diagnosis not present

## 2014-07-08 DIAGNOSIS — E6609 Other obesity due to excess calories: Secondary | ICD-10-CM | POA: Diagnosis not present

## 2014-07-20 ENCOUNTER — Ambulatory Visit
Admission: RE | Admit: 2014-07-20 | Discharge: 2014-07-20 | Disposition: A | Discharge status: Home / Self Care | Payer: Medicare Other | Source: Ambulatory Visit | Attending: Family Medicine | Admitting: Family Medicine

## 2014-07-20 ENCOUNTER — Other Ambulatory Visit: Payer: Self-pay | Admitting: Family Medicine

## 2014-07-20 DIAGNOSIS — Z1231 Encounter for screening mammogram for malignant neoplasm of breast: Secondary | ICD-10-CM | POA: Diagnosis not present

## 2014-07-25 ENCOUNTER — Emergency Department: Payer: Medicare Other

## 2014-07-25 ENCOUNTER — Emergency Department
Admission: EM | Admit: 2014-07-25 | Discharge: 2014-07-25 | Disposition: A | Discharge status: Home / Self Care | Payer: Medicare Other | Attending: Emergency Medicine | Admitting: Emergency Medicine

## 2014-07-25 ENCOUNTER — Encounter: Payer: Self-pay | Admitting: Emergency Medicine

## 2014-07-25 DIAGNOSIS — M5134 Other intervertebral disc degeneration, thoracic region: Secondary | ICD-10-CM | POA: Diagnosis not present

## 2014-07-25 DIAGNOSIS — R001 Bradycardia, unspecified: Secondary | ICD-10-CM | POA: Insufficient documentation

## 2014-07-25 DIAGNOSIS — G8929 Other chronic pain: Secondary | ICD-10-CM | POA: Insufficient documentation

## 2014-07-25 DIAGNOSIS — Z79899 Other long term (current) drug therapy: Secondary | ICD-10-CM | POA: Diagnosis not present

## 2014-07-25 DIAGNOSIS — I10 Essential (primary) hypertension: Secondary | ICD-10-CM | POA: Diagnosis not present

## 2014-07-25 DIAGNOSIS — M5136 Other intervertebral disc degeneration, lumbar region: Secondary | ICD-10-CM | POA: Diagnosis not present

## 2014-07-25 DIAGNOSIS — M4184 Other forms of scoliosis, thoracic region: Secondary | ICD-10-CM | POA: Diagnosis not present

## 2014-07-25 DIAGNOSIS — Z981 Arthrodesis status: Secondary | ICD-10-CM | POA: Diagnosis not present

## 2014-07-25 DIAGNOSIS — M549 Dorsalgia, unspecified: Secondary | ICD-10-CM | POA: Insufficient documentation

## 2014-07-25 DIAGNOSIS — R109 Unspecified abdominal pain: Secondary | ICD-10-CM | POA: Diagnosis not present

## 2014-07-25 DIAGNOSIS — M546 Pain in thoracic spine: Secondary | ICD-10-CM | POA: Diagnosis not present

## 2014-07-25 DIAGNOSIS — R0682 Tachypnea, not elsewhere classified: Secondary | ICD-10-CM | POA: Diagnosis not present

## 2014-07-25 DIAGNOSIS — Z462 Encounter for fitting and adjustment of other devices related to nervous system and special senses: Secondary | ICD-10-CM | POA: Diagnosis not present

## 2014-07-25 DIAGNOSIS — M4186 Other forms of scoliosis, lumbar region: Secondary | ICD-10-CM | POA: Diagnosis not present

## 2014-07-25 LAB — CBC WITH DIFFERENTIAL/PLATELET
BASOS PCT: 1 %
Basophils Absolute: 0 10*3/uL (ref 0–0.1)
EOS PCT: 4 %
Eosinophils Absolute: 0.2 10*3/uL (ref 0–0.7)
HCT: 45 % (ref 35.0–47.0)
Hemoglobin: 15.2 g/dL (ref 12.0–16.0)
LYMPHS ABS: 2.9 10*3/uL (ref 1.0–3.6)
LYMPHS PCT: 49 %
MCH: 30.7 pg (ref 26.0–34.0)
MCHC: 33.7 g/dL (ref 32.0–36.0)
MCV: 91.2 fL (ref 80.0–100.0)
Monocytes Absolute: 0.6 10*3/uL (ref 0.2–0.9)
Monocytes Relative: 10 %
NEUTROS PCT: 36 %
Neutro Abs: 2.1 10*3/uL (ref 1.4–6.5)
Platelets: 214 10*3/uL (ref 150–440)
RBC: 4.93 MIL/uL (ref 3.80–5.20)
RDW: 13.7 % (ref 11.5–14.5)
WBC: 5.8 10*3/uL (ref 3.6–11.0)

## 2014-07-25 LAB — BASIC METABOLIC PANEL
Anion gap: 7 (ref 5–15)
BUN: 18 mg/dL (ref 6–20)
CO2: 26 mmol/L (ref 22–32)
CREATININE: 1.14 mg/dL — AB (ref 0.44–1.00)
Calcium: 9.8 mg/dL (ref 8.9–10.3)
Chloride: 107 mmol/L (ref 101–111)
GFR calc Af Amer: 54 mL/min — ABNORMAL LOW (ref >60)
GFR calc non Af Amer: 47 mL/min — ABNORMAL LOW (ref >60)
Glucose, Bld: 103 mg/dL — ABNORMAL HIGH (ref 65–99)
Potassium: 4.4 mmol/L (ref 3.5–5.1)
Sodium: 140 mmol/L (ref 135–145)

## 2014-07-25 LAB — URINALYSIS COMPLETE WITH MICROSCOPIC (ARMC ONLY)
BILIRUBIN URINE: NEGATIVE
Bacteria, UA: NONE SEEN
GLUCOSE, UA: NEGATIVE mg/dL
HGB URINE DIPSTICK: NEGATIVE
Ketones, ur: NEGATIVE mg/dL
NITRITE: NEGATIVE
Protein, ur: NEGATIVE mg/dL
Specific Gravity, Urine: 1.005 (ref 1.005–1.030)
Trans Epithel, UA: 1
pH: 5 (ref 5.0–8.0)

## 2014-07-25 MED ORDER — OXYCODONE HCL 5 MG PO TABS
ORAL_TABLET | ORAL | Status: AC
  Filled 2014-07-25: qty 2

## 2014-07-25 MED ORDER — OXYCODONE HCL 5 MG PO TABS
10.0000 mg | ORAL_TABLET | Freq: Once | ORAL | Status: AC
  Administered 2014-07-25: 10 mg via ORAL

## 2014-07-25 NOTE — ED Provider Notes (Signed)
[facility] Emergency Department Provider Note  ____________________________________________  Time seen: 1025  I have reviewed the triage vital signs and the nursing notes.   HISTORY  Chief Complaint Flank Pain  back pain bilateral    HPI Madison Jennings is a 74 y.o. female with a complicated history of back problems. She had a nerve stimulator implanted. She has rods in her back. She is now complaining of worsening pain in her back" from the waist up" that began on Thursday. It has been worsening over the past few days. She has been taking her oxycodone, 5 mg, at home with little relief. She doesn't like pills and she is not taking much of it. Her pain is worse with movement. She denies any abdominal pain. She denies any dysuria. She is not having any focal weakness or change in sensation in her legs.  She reports she feels like she has pain in both sides of her ribs and her back. It feels as though her ribs are "breaking"    Past Medical History  Diagnosis Date  . Chronic back pain   . Hypothyroidism     takes Synthroid daily  . GERD (gastroesophageal reflux disease)     takes Protonix daily  . Hypertension     takes Lisinopril daily  . Hyperlipidemia     just diagnosed;will follow up in 6wks to discuss meds  . Headache(784.0)     sinus related  . Fibromyalgia   . Arthritis   . Joint pain   . Chronic back pain     scoliosis and 2 buldging disc  . Vitamin D deficiency     just placed on Vit D this week  . Diverticulosis   . Nocturia     Patient Active Problem List   Diagnosis Date Noted  . Pain in joint, shoulder region 11/22/2013  . Shoulder weakness 11/22/2013  . Diarrhea 05/02/2011  . Generalized weakness 05/02/2011  . Dehydration 05/02/2011  . HTN (hypertension) 05/02/2011  . Hypothyroidism 05/02/2011  . GERD (gastroesophageal reflux disease) 05/02/2011    Past Surgical History  Procedure Laterality Date  . Abdominal hysterectomy     . Cholecystectomy    . Foot surgery Left     d/t neuroma  . Back surgery      x 3  . Radiation to thyroid    . Colonoscopy    . Cardiac catheterization  04/13/2010    normal cororanies, EF 55% ([facility]; Dr. Clayborn Bigness)  . Spinal cord stimulator insertion N/A 07/16/2013    Procedure: LUMBAR SPINAL CORD STIMULATOR INSERTION;  Surgeon: Bonna Gains, MD;  Location: [facility];  Service: Neurosurgery;  Laterality: N/A;    Current Outpatient Rx  Name  Route  Sig  Dispense  Refill  . celecoxib (CELEBREX) 200 MG capsule   Oral   Take 200 mg by mouth 2 (two) times daily as needed for mild pain or moderate pain.         Marland Kitchen ibuprofen (ADVIL,MOTRIN) 200 MG tablet   Oral   Take 400 mg by mouth every 6 (six) hours as needed.         Marland Kitchen levothyroxine (SYNTHROID, LEVOTHROID) 75 MCG tablet   Oral   Take 75 mcg by mouth daily before breakfast.         . lisinopril (PRINIVIL,ZESTRIL) 40 MG tablet   Oral   Take 40 mg by mouth daily.         Marland Kitchen oxyCODONE-acetaminophen (PERCOCET/ROXICET) 5-325 MG  per tablet   Oral   Take 1-2 tablets by mouth every 6 (six) hours as needed for severe pain.   20 tablet   0   . pantoprazole (PROTONIX) 40 MG tablet   Oral   Take 40 mg by mouth daily.         . cephALEXin (KEFLEX) 500 MG capsule   Oral   Take 1 capsule (500 mg total) by mouth 3 (three) times daily. Patient not taking: Reported on 04/20/2014   30 capsule   0   . cyclobenzaprine (FLEXERIL) 5 MG tablet   Oral   Take 1 tablet (5 mg total) by mouth 3 (three) times daily as needed for muscle spasms. Patient not taking: Reported on 07/25/2014   20 tablet   0     Allergies Review of patient's allergies indicates no known allergies.  No family history on file.  Social History History  Substance Use Topics  . Smoking status: Never Smoker   . Smokeless tobacco: Not on file  . Alcohol Use: No    Review of Systems  Constitutional: Negative for fever. ENT: Negative for sore  throat. Cardiovascular: Negative for chest pain. Respiratory: Negative for shortness of breath. Gastrointestinal: Negative for abdominal pain, vomiting and diarrhea. Genitourinary: Negative for dysuria. Musculoskeletal: Notable for back pain with a complicated history. See history of present illness Skin: Negative for rash. Neurological: Negative for headaches   10-point ROS otherwise negative.  ____________________________________________   PHYSICAL EXAM:  VITAL SIGNS: ED Triage Vitals  Enc Vitals Group     BP 07/25/14 0935 119/54 mmHg     Pulse Rate 07/25/14 0935 50     Resp 07/25/14 0935 20     Temp 07/25/14 0935 98.7 F (37.1 C)     Temp Source 07/25/14 0935 Oral     SpO2 07/25/14 0935 96 %     Weight 07/25/14 0935 221 lb (100.245 kg)     Height 07/25/14 0935 5\' 5"  (1.651 m)     Head Cir --      Peak Flow --      Pain Score 07/25/14 0936 8     Pain Loc --      Pain Edu? --      Excl. in [place]? --     Constitutional: Alert and oriented. Patient appears a little bit uncomfortable.  ENT   Head: Normocephalic and atraumatic.   Nose: No congestion/rhinnorhea.   Mouth/Throat: Mucous membranes are moist. Cardiovascular: Bradycardia at 55. Respiratory: Normal respiratory effort without tachypnea. Breath sounds are clear and equal bilaterally. No wheezes/rales/rhonchi. Gastrointestinal: Soft and nontender. No distention.  Back: Benign appearing back. There is a midline scar midthoracic that is well-healed. There is no erythema. She has no focal tenderness on palpation. There is no noted muscle spasm. She has discomfort when she sits up and with other movements. Musculoskeletal: Nontender with normal range of motion in all extremities.  No noted edema. Neurologic:  Normal speech and language. No gross focal neurologic deficits are appreciated. Good R and dorsi flexion of both feet. Equal grip strength in her hands. However 5 strength in all extremities. Sensation intact  in all extremities. Skin:  Skin is warm, dry. No rash noted. Psychiatric: Mood and affect are normal. Speech and behavior are normal.  ____________________________________________    LABS (pertinent positives/negatives)  CBC is unremarkable. Metabolic panel overall is within normal limits. Urinalysis is normal with no white blood cells and no red blood cells present.  ____________________________________________  RADIOLOGY  CXR: IMPRESSION: 1. No active cardiopulmonary abnormalities.  Thoracic Spine: IMPRESSION: 1. Thoracic scoliosis and degenerative disc disease.  Lumbar spine: IMPRESSION: No acute finding. Stable compared to prior exam. ____________________________________________ ____________________________________________   INITIAL IMPRESSION / ASSESSMENT AND PLAN / ED COURSE  It is unclear what is causing this patient's diffuse vague pain in her back that spreads bilaterally including the ribs. None of these areas are tender on palpation. She does not have any history of fall or trauma. She has good function although she does have pain with movement. We will do plain radiographs of her lumbar and thoracic spine as well as a chest x-ray. I have encouraged her to take a higher dose of her oxycodone. With her lengthy complicated history I mentioned she has a higher tolerance despite the fact that she does not like to take medications. I will treated with 10 mg of oxycodone now.   ----------------------------------------- 12:32 PM on 07/25/2014 -----------------------------------------  X-rays without acute changes. Recheck of patient - patient is more comfortable after the 10 mg of oxycodone she received. We discussed ongoing pain control. She has oxycodone at home. We have discussed follow-up. She has been seen at [facility] in the past but the last procedures done approximate year ago was done in [place]. This included the nurse stimulate her that she has. She will follow  with her primary physician as well as the neurosurgeons in [facility].  ____________________________________________   FINAL CLINICAL IMPRESSION(S) / ED DIAGNOSES  Final diagnoses:  Back pain, acute      Ahmed Prima, MD 07/25/14 1251

## 2014-07-25 NOTE — Discharge Instructions (Signed)
Back Pain, Adult Low back pain is very common. About 1 in 5 people have back pain.The cause of low back pain is rarely dangerous. The pain often gets better over time.About half of people with a sudden onset of back pain feel better in just 2 weeks. About 8 in 10 people feel better by 6 weeks.  CAUSES Some common causes of back pain include:  Strain of the muscles or ligaments supporting the spine.  Wear and tear (degeneration) of the spinal discs.  Arthritis.  Direct injury to the back. DIAGNOSIS Most of the time, the direct cause of low back pain is not known.However, back pain can be treated effectively even when the exact cause of the pain is unknown.Answering your caregiver's questions about your overall health and symptoms is one of the most accurate ways to make sure the cause of your pain is not dangerous. If your caregiver needs more information, he or she may order lab work or imaging tests (X-rays or MRIs).However, even if imaging tests show changes in your back, this usually does not require surgery. HOME CARE INSTRUCTIONS For many people, back pain returns.Since low back pain is rarely dangerous, it is often a condition that people can learn to manageon their own.   Remain active. It is stressful on the back to sit or stand in one place. Do not sit, drive, or stand in one place for more than 30 minutes at a time. Take short walks on level surfaces as soon as pain allows.Try to increase the length of time you walk each day.  Do not stay in bed.Resting more than 1 or 2 days can delay your recovery.  Do not avoid exercise or work.Your body is made to move.It is not dangerous to be active, even though your back may hurt.Your back will likely heal faster if you return to being active before your pain is gone.  Pay attention to your body when you bend and lift. Many people have less discomfortwhen lifting if they bend their knees, keep the load close to their bodies,and  avoid twisting. Often, the most comfortable positions are those that put less stress on your recovering back.  Find a comfortable position to sleep. Use a firm mattress and lie on your side with your knees slightly bent. If you lie on your back, put a pillow under your knees.  Only take over-the-counter or prescription medicines as directed by your caregiver. Over-the-counter medicines to reduce pain and inflammation are often the most helpful.Your caregiver may prescribe muscle relaxant drugs.These medicines help dull your pain so you can more quickly return to your normal activities and healthy exercise.  Put ice on the injured area.  Put ice in a plastic bag.  Place a towel between your skin and the bag.  Leave the ice on for 15-20 minutes, 03-04 times a day for the first 2 to 3 days. After that, ice and heat may be alternated to reduce pain and spasms.  Ask your caregiver about trying back exercises and gentle massage. This may be of some benefit.  Avoid feeling anxious or stressed.Stress increases muscle tension and can worsen back pain.It is important to recognize when you are anxious or stressed and learn ways to manage it.Exercise is a great option. SEEK MEDICAL CARE IF:  You have pain that is not relieved with rest or medicine.  You have pain that does not improve in 1 week.  You have new symptoms.  You are generally not feeling well. SEEK   IMMEDIATE MEDICAL CARE IF:   You have pain that radiates from your back into your legs.  You develop new bowel or bladder control problems.  You have unusual weakness or numbness in your arms or legs.  You develop nausea or vomiting.  You develop abdominal pain.  You feel faint. Document Released: 02/11/2005 Document Revised: 08/13/2011 Document Reviewed: 06/15/2013 Exit[employer] Patient Information 2015 [employer]. This information is not intended to replace advice given to you by your health care provider. Make sure you  discuss any questions you have with your health care provider.  

## 2014-07-25 NOTE — ED Notes (Signed)
Pt reports that she has back problems and it started out hurting there on Thursday, and Friday it had went into her bilat flank area. She denies any injury, or urinary symptoms.

## 2014-07-27 DIAGNOSIS — R001 Bradycardia, unspecified: Secondary | ICD-10-CM | POA: Diagnosis not present

## 2014-07-27 DIAGNOSIS — I10 Essential (primary) hypertension: Secondary | ICD-10-CM | POA: Diagnosis not present

## 2014-07-27 DIAGNOSIS — G8929 Other chronic pain: Secondary | ICD-10-CM | POA: Diagnosis not present

## 2014-07-27 DIAGNOSIS — M4310 Spondylolisthesis, site unspecified: Secondary | ICD-10-CM | POA: Diagnosis not present

## 2014-07-27 DIAGNOSIS — N261 Atrophy of kidney (terminal): Secondary | ICD-10-CM | POA: Diagnosis not present

## 2014-07-27 DIAGNOSIS — M549 Dorsalgia, unspecified: Secondary | ICD-10-CM | POA: Diagnosis not present

## 2014-07-27 DIAGNOSIS — Z79891 Long term (current) use of opiate analgesic: Secondary | ICD-10-CM | POA: Diagnosis not present

## 2014-07-27 DIAGNOSIS — R51 Headache: Secondary | ICD-10-CM | POA: Diagnosis not present

## 2014-07-27 DIAGNOSIS — M546 Pain in thoracic spine: Secondary | ICD-10-CM | POA: Insufficient documentation

## 2014-07-27 DIAGNOSIS — K219 Gastro-esophageal reflux disease without esophagitis: Secondary | ICD-10-CM | POA: Diagnosis not present

## 2014-07-27 DIAGNOSIS — J9811 Atelectasis: Secondary | ICD-10-CM | POA: Diagnosis not present

## 2014-07-27 DIAGNOSIS — R079 Chest pain, unspecified: Secondary | ICD-10-CM | POA: Diagnosis not present

## 2014-07-27 DIAGNOSIS — M47812 Spondylosis without myelopathy or radiculopathy, cervical region: Secondary | ICD-10-CM | POA: Diagnosis not present

## 2014-07-27 DIAGNOSIS — E039 Hypothyroidism, unspecified: Secondary | ICD-10-CM | POA: Diagnosis not present

## 2014-07-27 DIAGNOSIS — M5136 Other intervertebral disc degeneration, lumbar region: Secondary | ICD-10-CM | POA: Diagnosis not present

## 2014-07-27 DIAGNOSIS — Z9689 Presence of other specified functional implants: Secondary | ICD-10-CM | POA: Insufficient documentation

## 2014-07-27 DIAGNOSIS — F17210 Nicotine dependence, cigarettes, uncomplicated: Secondary | ICD-10-CM | POA: Diagnosis not present

## 2014-07-27 DIAGNOSIS — M4315 Spondylolisthesis, thoracolumbar region: Secondary | ICD-10-CM | POA: Diagnosis not present

## 2014-07-27 DIAGNOSIS — M47814 Spondylosis without myelopathy or radiculopathy, thoracic region: Secondary | ICD-10-CM | POA: Diagnosis not present

## 2014-07-27 DIAGNOSIS — N302 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: Secondary | ICD-10-CM | POA: Diagnosis not present

## 2014-07-28 DIAGNOSIS — M546 Pain in thoracic spine: Secondary | ICD-10-CM | POA: Diagnosis not present

## 2014-07-28 DIAGNOSIS — N390 Urinary tract infection, site not specified: Secondary | ICD-10-CM | POA: Diagnosis not present

## 2014-09-09 DIAGNOSIS — Z1389 Encounter for screening for other disorder: Secondary | ICD-10-CM | POA: Diagnosis not present

## 2014-09-09 DIAGNOSIS — M545 Low back pain: Secondary | ICD-10-CM | POA: Diagnosis not present

## 2014-09-09 DIAGNOSIS — Z681 Body mass index (BMI) 19 or less, adult: Secondary | ICD-10-CM | POA: Diagnosis not present

## 2014-09-09 DIAGNOSIS — J0100 Acute maxillary sinusitis, unspecified: Secondary | ICD-10-CM | POA: Diagnosis not present

## 2014-11-25 ENCOUNTER — Encounter: Payer: Self-pay | Admitting: *Deleted

## 2014-11-25 ENCOUNTER — Emergency Department
Admission: EM | Admit: 2014-11-25 | Discharge: 2014-11-26 | Disposition: A | Discharge status: Home / Self Care | Payer: Medicare Other | Attending: Emergency Medicine | Admitting: Emergency Medicine

## 2014-11-25 DIAGNOSIS — Z79899 Other long term (current) drug therapy: Secondary | ICD-10-CM | POA: Diagnosis not present

## 2014-11-25 DIAGNOSIS — I10 Essential (primary) hypertension: Secondary | ICD-10-CM | POA: Diagnosis not present

## 2014-11-25 DIAGNOSIS — R457 State of emotional shock and stress, unspecified: Secondary | ICD-10-CM

## 2014-11-25 DIAGNOSIS — R000 Tachycardia, unspecified: Secondary | ICD-10-CM | POA: Insufficient documentation

## 2014-11-25 DIAGNOSIS — F4320 Adjustment disorder, unspecified: Secondary | ICD-10-CM | POA: Diagnosis not present

## 2014-11-25 DIAGNOSIS — F439 Reaction to severe stress, unspecified: Secondary | ICD-10-CM | POA: Diagnosis not present

## 2014-11-25 DIAGNOSIS — Z792 Long term (current) use of antibiotics: Secondary | ICD-10-CM | POA: Diagnosis not present

## 2014-11-25 DIAGNOSIS — F419 Anxiety disorder, unspecified: Secondary | ICD-10-CM | POA: Diagnosis present

## 2014-11-25 DIAGNOSIS — F4321 Adjustment disorder with depressed mood: Secondary | ICD-10-CM

## 2014-11-25 DIAGNOSIS — Z634 Disappearance and death of family member: Secondary | ICD-10-CM

## 2014-11-25 MED ORDER — LORAZEPAM 1 MG PO TABS
1.0000 mg | ORAL_TABLET | Freq: Once | ORAL | Status: AC
  Administered 2014-11-25: 1 mg via ORAL
  Filled 2014-11-25: qty 1

## 2014-11-25 MED ORDER — LORAZEPAM 0.5 MG PO TABS: 0.5000 mg | ORAL_TABLET | Freq: Three times a day (TID) | ORAL | Status: AC | PRN

## 2014-11-25 NOTE — ED Provider Notes (Signed)
[facility] Emergency Department Provider Note  ____________________________________________  Time seen: Approximately 2320 PM  I have reviewed the triage vital signs and the nursing notes.   HISTORY  Chief Complaint Anxiety    HPI Madison Jennings is a 74 y.o. female whose son was killed in a motorcycle accident tonight. The patient's family was here in the hospital and the patient is very distraught and upset. The patient's husband is concerned and feels that she may need something for her nerves. The patient denies any chest pain, any shortness of breath, any dizziness or lightheadedness. The patient reports that she does have some nausea on and off but is not having any abdominal pain at this time. The patient's family was concerned due to her stress and her level of being upset so they had her check into the hospital to obtain something to help her rest and relax during this time of grief. The patient had just recently found out that her son was killed tonight.   Past Medical History  Diagnosis Date  . Chronic back pain   . Hypothyroidism     takes Synthroid daily  . GERD (gastroesophageal reflux disease)     takes Protonix daily  . Hypertension     takes Lisinopril daily  . Hyperlipidemia     just diagnosed;will follow up in 6wks to discuss meds  . Headache(784.0)     sinus related  . Fibromyalgia   . Arthritis   . Joint pain   . Chronic back pain     scoliosis and 2 buldging disc  . Vitamin D deficiency     just placed on Vit D this week  . Diverticulosis   . Nocturia     Patient Active Problem List   Diagnosis Date Noted  . Pain in joint, shoulder region 11/22/2013  . Shoulder weakness 11/22/2013  . Diarrhea 05/02/2011  . Generalized weakness 05/02/2011  . Dehydration 05/02/2011  . HTN (hypertension) 05/02/2011  . Hypothyroidism 05/02/2011  . GERD (gastroesophageal reflux disease) 05/02/2011    Past Surgical History  Procedure  Laterality Date  . Abdominal hysterectomy    . Cholecystectomy    . Foot surgery Left     d/t neuroma  . Back surgery      x 3  . Radiation to thyroid    . Colonoscopy    . Cardiac catheterization  04/13/2010    normal cororanies, EF 55% ([facility]; Dr. Clayborn Bigness)  . Spinal cord stimulator insertion N/A 07/16/2013    Procedure: LUMBAR SPINAL CORD STIMULATOR INSERTION;  Surgeon: Bonna Gains, MD;  Location: [place];  Service: Neurosurgery;  Laterality: N/A;    Current Outpatient Rx  Name  Route  Sig  Dispense  Refill  . celecoxib (CELEBREX) 200 MG capsule   Oral   Take 200 mg by mouth 2 (two) times daily as needed for mild pain or moderate pain.         . cephALEXin (KEFLEX) 500 MG capsule   Oral   Take 1 capsule (500 mg total) by mouth 3 (three) times daily. Patient not taking: Reported on 04/20/2014   30 capsule   0   . cyclobenzaprine (FLEXERIL) 5 MG tablet   Oral   Take 1 tablet (5 mg total) by mouth 3 (three) times daily as needed for muscle spasms. Patient not taking: Reported on 07/25/2014   20 tablet   0   . ibuprofen (ADVIL,MOTRIN) 200 MG tablet   Oral  Take 400 mg by mouth every 6 (six) hours as needed.         Marland Kitchen levothyroxine (SYNTHROID, LEVOTHROID) 75 MCG tablet   Oral   Take 75 mcg by mouth daily before breakfast.         . lisinopril (PRINIVIL,ZESTRIL) 40 MG tablet   Oral   Take 40 mg by mouth daily.         Marland Kitchen LORazepam (ATIVAN) 0.5 MG tablet   Oral   Take 1 tablet (0.5 mg total) by mouth every 8 (eight) hours as needed for anxiety.   12 tablet   0   . oxyCODONE-acetaminophen (PERCOCET/ROXICET) 5-325 MG per tablet   Oral   Take 1-2 tablets by mouth every 6 (six) hours as needed for severe pain.   20 tablet   0   . pantoprazole (PROTONIX) 40 MG tablet   Oral   Take 40 mg by mouth daily.           Allergies Review of patient's allergies indicates no known allergies.  No family history on file.  Social History Social History   Substance Use Topics  . Smoking status: Never Smoker   . Smokeless tobacco: None  . Alcohol Use: No    Review of Systems Constitutional: No fever/chills Eyes: No visual changes. ENT: No sore throat. Cardiovascular: Denies chest pain. Respiratory: Denies shortness of breath. Gastrointestinal: Nausea with No abdominal pain.  no vomiting.  No diarrhea.  No constipation. Genitourinary: Negative for dysuria. Musculoskeletal: Negative for back pain. Skin: Negative for rash. Neurological: Negative for headaches, focal weakness or numbness.  10-point ROS otherwise negative.  ____________________________________________   PHYSICAL EXAM:  VITAL SIGNS: ED Triage Vitals  Enc Vitals Group     BP 11/25/14 2203 151/108 mmHg     Pulse Rate 11/25/14 2203 117     Resp 11/25/14 2203 22     Temp 11/25/14 2203 98.3 F (36.8 C)     Temp Source 11/25/14 2203 Oral     SpO2 11/25/14 2203 98 %     Weight --      Height --      Head Cir --      Peak Flow --      Pain Score --      Pain Loc --      Pain Edu? --      Excl. in [place]? --     Constitutional: Alert and oriented. Tearful and crying in some distress. Eyes: Conjunctivae are normal. PERRL. EOMI. Head: Atraumatic. Nose: No congestion/rhinnorhea. Mouth/Throat: Mucous membranes are moist.  Oropharynx non-erythematous. Cardiovascular: Tachycardia regular rhythm. Grossly normal heart sounds.  Good peripheral circulation. Respiratory: Normal respiratory effort.  No retractions. Lungs CTAB. Gastrointestinal: Soft and nontender. No distention. Positive bowel sounds. Musculoskeletal: No lower extremity tenderness nor edema.   Neurologic:  Normal speech and language.  Skin:  Skin is warm, dry and intact.  Psychiatric: Mood and affect are normal.   ____________________________________________   LABS (all labs ordered are listed, but only abnormal results are displayed)  Labs Reviewed - No data to  display ____________________________________________  EKG  None ____________________________________________  RADIOLOGY  None ____________________________________________   PROCEDURES  Procedure(s) performed: None  Critical Care performed: No  ____________________________________________   INITIAL IMPRESSION / ASSESSMENT AND PLAN / ED COURSE  Pertinent labs & imaging results that were available during my care of the patient were reviewed by me and considered in my medical decision making (see chart for details).  This  is a 74 year old female who was in the hospital tonight as her son was killed in a motorcycle accident. The patient is very upset and tearful which is appropriate with the grief response. The patient's husband is concerned that she may not be able to rest tonight and is asking for something to help her rest. The patient was tachycardic but that may be due to the fact that she is crying actively in the room. I gave the patient a dose of Ativan 1 mg orally and she has no complaints aside from being sad that her son is gone. I explained to the patient that it is very normal for her to have this grief and that there is no specific time frame are limited to her grief. The patient will be discharged to home and I will also give her a prescription for Ativan to help her rest at home. ____________________________________________   FINAL CLINICAL IMPRESSION(S) / ED DIAGNOSES  Final diagnoses:  Grief at loss of child  Emotional stress      Loney Hering, MD 11/26/14 0007

## 2014-11-25 NOTE — Discharge Instructions (Signed)
Grief Reaction Grief is a normal response to the death of someone close to you. Feelings of fear, anger, and guilt can affect almost everyone who loses someone they love. Symptoms of depression are also common. These include problems with sleep, loss of appetite, and lack of energy. These grief reaction symptoms often last for weeks to months after a loss. They may also return during special times that remind you of the person you lost, such as an anniversary or birthday. Anxiety, insomnia, irritability, and deep depression may last beyond the period of normal grief. If you experience these feelings for 6 months or longer, you may have clinical depression. Clinical depression requires further medical attention. If you think that you have clinical depression, you should contact your caregiver. If you have a history of depression or a family history of depression, you are at greater risk of clinical depression. You are also at greater risk of developing clinical depression if the loss was traumatic or the loss was of someone with whom you had unresolved issues.  A grief reaction can become complicated by being blocked. This means being unable to cry or express extreme emotions. This may prolong the grieving period and worsen the emotional effects of the loss. Mourning is a natural event in human life. A healthy grief reaction is one that is not blocked. It requires a time of sadness and readjustment. It is very important to share your sorrow and fear with others, especially close friends and family. Professional counselors and clergy can also help you process your grief. Document Released: 02/11/2005 Document Revised: 06/28/2013 Document Reviewed: 10/22/2005 [facility] Patient Information 2015 [place], [place]. This information is not intended to replace advice given to you by your health care provider. Make sure you discuss any questions you have with your health care provider.  Stress Stress-related medical  problems are becoming increasingly common. The body has a built-in physical response to stressful situations. Faced with pressure, challenge or danger, we need to react quickly. Our bodies release hormones such as cortisol and adrenaline to help do this. These hormones are part of the "fight or flight" response and affect the metabolic rate, heart rate and blood pressure, resulting in a heightened, stressed state that prepares the body for optimum performance in dealing with a stressful situation. It is likely that early man required these mechanisms to stay alive, but usually modern stresses do not call for this, and the same hormones released in today's world can damage health and reduce coping ability. CAUSES  Pressure to perform at work, at school or in sports.  Threats of physical violence.  Money worries.  Arguments.  Family conflicts.  Divorce or separation from significant other.  Bereavement.  New job or unemployment.  Changes in location.  Alcohol or drug abuse. SOMETIMES, THERE IS NO PARTICULAR REASON FOR DEVELOPING STRESS. Almost all people are at risk of being stressed at some time in their lives. It is important to know that some stress is temporary and some is long term.  Temporary stress will go away when a situation is resolved. Most people can cope with short periods of stress, and it can often be relieved by relaxing, taking a walk or getting any type of exercise, chatting through issues with friends, or having a good night's sleep.  Chronic (long-term, continuous) stress is much harder to deal with. It can be psychologically and emotionally damaging. It can be harmful both for an individual and for friends and family. SYMPTOMS Everyone reacts to stress  differently. There are some common effects that help [place] recognize it. In times of extreme stress, people may:  Shake uncontrollably.  Breathe faster and deeper than normal (hyperventilate).  Vomit.  For people  with asthma, stress can trigger an attack.  For some people, stress may trigger migraine headaches, ulcers, and body pain. PHYSICAL EFFECTS OF STRESS MAY INCLUDE:  Loss of energy.  Skin problems.  Aches and pains resulting from tense muscles, including neck ache, backache and tension headaches.  Increased pain from arthritis and other conditions.  Irregular heart beat (palpitations).  Periods of irritability or anger.  Apathy or depression.  Anxiety (feeling uptight or worrying).  Unusual behavior.  Loss of appetite.  Comfort eating.  Lack of concentration.  Loss of, or decreased, sex-drive.  Increased smoking, drinking, or recreational drug use.  For women, missed periods.  Ulcers, joint pain, and muscle pain. Post-traumatic stress is the stress caused by any serious accident, strong emotional damage, or extremely difficult or violent experience such as rape or war. Post-traumatic stress victims can experience mixtures of emotions such as fear, shame, depression, guilt or anger. It may include recurrent memories or images that may be haunting. These feelings can last for weeks, months or even years after the traumatic event that triggered them. Specialized treatment, possibly with medicines and psychological therapies, is available. If stress is causing physical symptoms, severe distress or making it difficult for you to function as normal, it is worth seeing your caregiver. It is important to remember that although stress is a usual part of life, extreme or prolonged stress can lead to other illnesses that will need treatment. It is better to visit a doctor sooner rather than later. Stress has been linked to the development of high blood pressure and heart disease, as well as insomnia and depression. There is no diagnostic test for stress since everyone reacts to it differently. But a caregiver will be able to spot the physical symptoms, such  as:  Headaches.  Shingles.  Ulcers. Emotional distress such as intense worry, low mood or irritability should be detected when the doctor asks pertinent questions to identify any underlying problems that might be the cause. In case there are physical reasons for the symptoms, the doctor may also want to do some tests to exclude certain conditions. If you feel that you are suffering from stress, try to identify the aspects of your life that are causing it. Sometimes you may not be able to change or avoid them, but even a small change can have a positive ripple effect. A simple lifestyle change can make all the difference. STRATEGIES THAT CAN HELP DEAL WITH STRESS:  Delegating or sharing responsibilities.  Avoiding confrontations.  Learning to be more assertive.  Regular exercise.  Avoid using alcohol or street drugs to cope.  Eating a healthy, balanced diet, rich in fruit and vegetables and proteins.  Finding humor or absurdity in stressful situations.  Never taking on more than you know you can handle comfortably.  Organizing your time better to get as much done as possible.  Talking to friends or family and sharing your thoughts and fears.  Listening to music or relaxation tapes.  Relaxation techniques like deep breathing, meditation, and yoga.  Tensing and then relaxing your muscles, starting at the toes and working up to the head and neck. If you think that you would benefit from help, either in identifying the things that are causing your stress or in learning techniques to help you relax,  see a caregiver who is capable of helping you with this. Rather than relying on medications, it is usually better to try and identify the things in your life that are causing stress and try to deal with them. There are many techniques of managing stress including counseling, psychotherapy, aromatherapy, yoga, and exercise. Your caregiver can help you determine what is best for  you. Document Released: 05/04/2002 Document Revised: 02/16/2013 Document Reviewed: 03/31/2007 [facility] Patient Information 2015 [place], [place]. This information is not intended to replace advice given to you by your health care provider. Make sure you discuss any questions you have with your health care provider.

## 2014-11-25 NOTE — ED Notes (Signed)
Pt denies SI/HI at this time, request discharge.

## 2014-11-25 NOTE — ED Notes (Addendum)
Pt having severe anxiety attack over the loss of her son tonight. Son was killed in motorcycle accident.

## 2014-12-14 DIAGNOSIS — E6609 Other obesity due to excess calories: Secondary | ICD-10-CM | POA: Diagnosis not present

## 2014-12-14 DIAGNOSIS — Z1389 Encounter for screening for other disorder: Secondary | ICD-10-CM | POA: Diagnosis not present

## 2014-12-14 DIAGNOSIS — M5136 Other intervertebral disc degeneration, lumbar region: Secondary | ICD-10-CM | POA: Diagnosis not present

## 2014-12-14 DIAGNOSIS — G4700 Insomnia, unspecified: Secondary | ICD-10-CM | POA: Diagnosis not present

## 2014-12-14 DIAGNOSIS — Z6834 Body mass index (BMI) 34.0-34.9, adult: Secondary | ICD-10-CM | POA: Diagnosis not present

## 2015-02-15 DIAGNOSIS — E890 Postprocedural hypothyroidism: Secondary | ICD-10-CM | POA: Diagnosis not present

## 2015-02-22 DIAGNOSIS — Z6833 Body mass index (BMI) 33.0-33.9, adult: Secondary | ICD-10-CM | POA: Diagnosis not present

## 2015-02-22 DIAGNOSIS — R52 Pain, unspecified: Secondary | ICD-10-CM | POA: Diagnosis not present

## 2015-02-22 DIAGNOSIS — Z1389 Encounter for screening for other disorder: Secondary | ICD-10-CM | POA: Diagnosis not present

## 2015-02-22 DIAGNOSIS — J0100 Acute maxillary sinusitis, unspecified: Secondary | ICD-10-CM | POA: Diagnosis not present

## 2015-02-22 DIAGNOSIS — E6609 Other obesity due to excess calories: Secondary | ICD-10-CM | POA: Diagnosis not present

## 2015-02-22 DIAGNOSIS — E890 Postprocedural hypothyroidism: Secondary | ICD-10-CM | POA: Diagnosis not present

## 2015-03-31 DIAGNOSIS — Z6833 Body mass index (BMI) 33.0-33.9, adult: Secondary | ICD-10-CM | POA: Diagnosis not present

## 2015-03-31 DIAGNOSIS — R39198 Other difficulties with micturition: Secondary | ICD-10-CM | POA: Diagnosis not present

## 2015-03-31 DIAGNOSIS — Z1389 Encounter for screening for other disorder: Secondary | ICD-10-CM | POA: Diagnosis not present

## 2015-03-31 DIAGNOSIS — E6609 Other obesity due to excess calories: Secondary | ICD-10-CM | POA: Diagnosis not present

## 2015-06-01 DIAGNOSIS — M545 Low back pain: Secondary | ICD-10-CM | POA: Diagnosis not present

## 2015-06-01 DIAGNOSIS — Z6832 Body mass index (BMI) 32.0-32.9, adult: Secondary | ICD-10-CM | POA: Diagnosis not present

## 2015-06-01 DIAGNOSIS — R350 Frequency of micturition: Secondary | ICD-10-CM | POA: Diagnosis not present

## 2015-06-01 DIAGNOSIS — N342 Other urethritis: Secondary | ICD-10-CM | POA: Diagnosis not present

## 2015-06-01 DIAGNOSIS — Z1389 Encounter for screening for other disorder: Secondary | ICD-10-CM | POA: Diagnosis not present

## 2015-06-12 DIAGNOSIS — H43813 Vitreous degeneration, bilateral: Secondary | ICD-10-CM | POA: Diagnosis not present

## 2015-06-13 ENCOUNTER — Other Ambulatory Visit (HOSPITAL_COMMUNITY): Payer: Self-pay | Admitting: Family Medicine

## 2015-06-13 DIAGNOSIS — M810 Age-related osteoporosis without current pathological fracture: Secondary | ICD-10-CM

## 2015-06-13 DIAGNOSIS — Z1231 Encounter for screening mammogram for malignant neoplasm of breast: Secondary | ICD-10-CM

## 2015-06-16 ENCOUNTER — Ambulatory Visit (HOSPITAL_COMMUNITY)
Admission: RE | Admit: 2015-06-16 | Discharge: 2015-06-16 | Disposition: A | Discharge status: Home / Self Care | Payer: Commercial Managed Care - HMO | Source: Ambulatory Visit | Attending: Family Medicine | Admitting: Family Medicine

## 2015-06-16 DIAGNOSIS — M899 Disorder of bone, unspecified: Secondary | ICD-10-CM | POA: Insufficient documentation

## 2015-06-16 DIAGNOSIS — M810 Age-related osteoporosis without current pathological fracture: Secondary | ICD-10-CM

## 2015-06-16 DIAGNOSIS — Z780 Asymptomatic menopausal state: Secondary | ICD-10-CM | POA: Diagnosis not present

## 2015-06-16 DIAGNOSIS — Z13820 Encounter for screening for osteoporosis: Secondary | ICD-10-CM | POA: Diagnosis not present

## 2015-06-30 ENCOUNTER — Ambulatory Visit (INDEPENDENT_AMBULATORY_CARE_PROVIDER_SITE_OTHER): Payer: Commercial Managed Care - HMO | Admitting: Urology

## 2015-06-30 DIAGNOSIS — A499 Bacterial infection, unspecified: Secondary | ICD-10-CM | POA: Diagnosis not present

## 2015-06-30 DIAGNOSIS — N952 Postmenopausal atrophic vaginitis: Secondary | ICD-10-CM

## 2015-06-30 DIAGNOSIS — R10813 Right lower quadrant abdominal tenderness: Secondary | ICD-10-CM

## 2015-06-30 DIAGNOSIS — N261 Atrophy of kidney (terminal): Secondary | ICD-10-CM

## 2015-06-30 DIAGNOSIS — R300 Dysuria: Secondary | ICD-10-CM | POA: Diagnosis not present

## 2015-07-19 ENCOUNTER — Other Ambulatory Visit: Payer: Self-pay | Admitting: Urology

## 2015-07-19 DIAGNOSIS — R1012 Left upper quadrant pain: Secondary | ICD-10-CM

## 2015-07-25 ENCOUNTER — Ambulatory Visit
Admission: RE | Admit: 2015-07-25 | Discharge: 2015-07-25 | Disposition: A | Discharge status: Home / Self Care | Payer: Commercial Managed Care - HMO | Source: Ambulatory Visit | Attending: Family Medicine | Admitting: Family Medicine

## 2015-07-25 ENCOUNTER — Other Ambulatory Visit (HOSPITAL_COMMUNITY): Payer: Self-pay | Admitting: Family Medicine

## 2015-07-25 DIAGNOSIS — Z1231 Encounter for screening mammogram for malignant neoplasm of breast: Secondary | ICD-10-CM | POA: Diagnosis not present

## 2015-07-26 ENCOUNTER — Ambulatory Visit (HOSPITAL_COMMUNITY): Payer: Medicare Other

## 2015-07-27 ENCOUNTER — Ambulatory Visit (HOSPITAL_COMMUNITY)
Admission: RE | Admit: 2015-07-27 | Discharge: 2015-07-27 | Disposition: A | Discharge status: Home / Self Care | Payer: Commercial Managed Care - HMO | Source: Ambulatory Visit | Attending: Urology | Admitting: Urology

## 2015-07-27 DIAGNOSIS — N270 Small kidney, unilateral: Secondary | ICD-10-CM | POA: Insufficient documentation

## 2015-07-27 DIAGNOSIS — R1012 Left upper quadrant pain: Secondary | ICD-10-CM | POA: Diagnosis not present

## 2015-08-03 DIAGNOSIS — M5416 Radiculopathy, lumbar region: Secondary | ICD-10-CM | POA: Diagnosis not present

## 2015-08-03 DIAGNOSIS — Z1389 Encounter for screening for other disorder: Secondary | ICD-10-CM | POA: Diagnosis not present

## 2015-08-03 DIAGNOSIS — M545 Low back pain: Secondary | ICD-10-CM | POA: Diagnosis not present

## 2015-08-03 DIAGNOSIS — Z6833 Body mass index (BMI) 33.0-33.9, adult: Secondary | ICD-10-CM | POA: Diagnosis not present

## 2015-08-30 ENCOUNTER — Other Ambulatory Visit (HOSPITAL_COMMUNITY): Payer: Self-pay | Admitting: Registered Nurse

## 2015-08-30 ENCOUNTER — Ambulatory Visit (HOSPITAL_COMMUNITY)
Admission: RE | Admit: 2015-08-30 | Discharge: 2015-08-30 | Disposition: A | Discharge status: Home / Self Care | Payer: Commercial Managed Care - HMO | Source: Ambulatory Visit | Attending: Registered Nurse | Admitting: Registered Nurse

## 2015-08-30 DIAGNOSIS — G8929 Other chronic pain: Secondary | ICD-10-CM

## 2015-08-30 DIAGNOSIS — M549 Dorsalgia, unspecified: Principal | ICD-10-CM

## 2015-08-30 DIAGNOSIS — Z6833 Body mass index (BMI) 33.0-33.9, adult: Secondary | ICD-10-CM | POA: Diagnosis not present

## 2015-08-30 DIAGNOSIS — M545 Low back pain: Secondary | ICD-10-CM | POA: Diagnosis not present

## 2015-08-30 DIAGNOSIS — E782 Mixed hyperlipidemia: Secondary | ICD-10-CM | POA: Diagnosis not present

## 2015-08-30 DIAGNOSIS — Z981 Arthrodesis status: Secondary | ICD-10-CM | POA: Diagnosis not present

## 2015-08-30 DIAGNOSIS — M8588 Other specified disorders of bone density and structure, other site: Secondary | ICD-10-CM | POA: Diagnosis not present

## 2015-08-30 DIAGNOSIS — Z9889 Other specified postprocedural states: Secondary | ICD-10-CM | POA: Insufficient documentation

## 2015-08-30 DIAGNOSIS — I10 Essential (primary) hypertension: Secondary | ICD-10-CM | POA: Diagnosis not present

## 2015-09-21 DIAGNOSIS — M961 Postlaminectomy syndrome, not elsewhere classified: Secondary | ICD-10-CM | POA: Diagnosis not present

## 2015-09-21 DIAGNOSIS — M5416 Radiculopathy, lumbar region: Secondary | ICD-10-CM | POA: Diagnosis not present

## 2015-09-21 DIAGNOSIS — Z6833 Body mass index (BMI) 33.0-33.9, adult: Secondary | ICD-10-CM | POA: Diagnosis not present

## 2015-10-06 ENCOUNTER — Ambulatory Visit: Payer: Commercial Managed Care - HMO | Admitting: Urology

## 2015-10-27 DIAGNOSIS — Z1389 Encounter for screening for other disorder: Secondary | ICD-10-CM | POA: Diagnosis not present

## 2015-10-27 DIAGNOSIS — G894 Chronic pain syndrome: Secondary | ICD-10-CM | POA: Diagnosis not present

## 2015-10-27 DIAGNOSIS — Z23 Encounter for immunization: Secondary | ICD-10-CM | POA: Diagnosis not present

## 2015-10-27 DIAGNOSIS — K219 Gastro-esophageal reflux disease without esophagitis: Secondary | ICD-10-CM | POA: Diagnosis not present

## 2015-10-27 DIAGNOSIS — Z6833 Body mass index (BMI) 33.0-33.9, adult: Secondary | ICD-10-CM | POA: Diagnosis not present

## 2015-12-22 DIAGNOSIS — E782 Mixed hyperlipidemia: Secondary | ICD-10-CM | POA: Diagnosis not present

## 2015-12-22 DIAGNOSIS — I10 Essential (primary) hypertension: Secondary | ICD-10-CM | POA: Diagnosis not present

## 2015-12-22 DIAGNOSIS — E6609 Other obesity due to excess calories: Secondary | ICD-10-CM | POA: Diagnosis not present

## 2015-12-22 DIAGNOSIS — J343 Hypertrophy of nasal turbinates: Secondary | ICD-10-CM | POA: Diagnosis not present

## 2015-12-22 DIAGNOSIS — Z1389 Encounter for screening for other disorder: Secondary | ICD-10-CM | POA: Diagnosis not present

## 2015-12-22 DIAGNOSIS — G894 Chronic pain syndrome: Secondary | ICD-10-CM | POA: Diagnosis not present

## 2015-12-22 DIAGNOSIS — E669 Obesity, unspecified: Secondary | ICD-10-CM | POA: Diagnosis not present

## 2015-12-22 DIAGNOSIS — J209 Acute bronchitis, unspecified: Secondary | ICD-10-CM | POA: Diagnosis not present

## 2015-12-22 DIAGNOSIS — Z6833 Body mass index (BMI) 33.0-33.9, adult: Secondary | ICD-10-CM | POA: Diagnosis not present

## 2015-12-22 DIAGNOSIS — Z79891 Long term (current) use of opiate analgesic: Secondary | ICD-10-CM | POA: Diagnosis not present

## 2015-12-22 DIAGNOSIS — H6123 Impacted cerumen, bilateral: Secondary | ICD-10-CM | POA: Diagnosis not present

## 2016-02-16 DIAGNOSIS — E890 Postprocedural hypothyroidism: Secondary | ICD-10-CM | POA: Diagnosis not present

## 2016-03-06 DIAGNOSIS — Z6834 Body mass index (BMI) 34.0-34.9, adult: Secondary | ICD-10-CM | POA: Diagnosis not present

## 2016-03-06 DIAGNOSIS — Z79891 Long term (current) use of opiate analgesic: Secondary | ICD-10-CM | POA: Diagnosis not present

## 2016-03-06 DIAGNOSIS — E782 Mixed hyperlipidemia: Secondary | ICD-10-CM | POA: Diagnosis not present

## 2016-03-06 DIAGNOSIS — R062 Wheezing: Secondary | ICD-10-CM | POA: Diagnosis not present

## 2016-03-06 DIAGNOSIS — G894 Chronic pain syndrome: Secondary | ICD-10-CM | POA: Diagnosis not present

## 2016-03-06 DIAGNOSIS — E039 Hypothyroidism, unspecified: Secondary | ICD-10-CM | POA: Diagnosis not present

## 2016-03-06 DIAGNOSIS — I10 Essential (primary) hypertension: Secondary | ICD-10-CM | POA: Diagnosis not present

## 2016-03-06 DIAGNOSIS — J069 Acute upper respiratory infection, unspecified: Secondary | ICD-10-CM | POA: Diagnosis not present

## 2016-03-06 DIAGNOSIS — R05 Cough: Secondary | ICD-10-CM | POA: Diagnosis not present

## 2016-03-25 DIAGNOSIS — E890 Postprocedural hypothyroidism: Secondary | ICD-10-CM | POA: Diagnosis not present

## 2016-04-09 DIAGNOSIS — J301 Allergic rhinitis due to pollen: Secondary | ICD-10-CM | POA: Diagnosis not present

## 2016-04-09 DIAGNOSIS — R51 Headache: Secondary | ICD-10-CM | POA: Diagnosis not present

## 2016-04-09 DIAGNOSIS — H9319 Tinnitus, unspecified ear: Secondary | ICD-10-CM | POA: Diagnosis not present

## 2016-05-23 DIAGNOSIS — E786 Lipoprotein deficiency: Secondary | ICD-10-CM | POA: Diagnosis not present

## 2016-05-23 DIAGNOSIS — Z0001 Encounter for general adult medical examination with abnormal findings: Secondary | ICD-10-CM | POA: Diagnosis not present

## 2016-05-23 DIAGNOSIS — E039 Hypothyroidism, unspecified: Secondary | ICD-10-CM | POA: Diagnosis not present

## 2016-05-23 DIAGNOSIS — Z6833 Body mass index (BMI) 33.0-33.9, adult: Secondary | ICD-10-CM | POA: Diagnosis not present

## 2016-05-23 DIAGNOSIS — E782 Mixed hyperlipidemia: Secondary | ICD-10-CM | POA: Diagnosis not present

## 2016-05-23 DIAGNOSIS — Z23 Encounter for immunization: Secondary | ICD-10-CM | POA: Diagnosis not present

## 2016-05-23 DIAGNOSIS — I10 Essential (primary) hypertension: Secondary | ICD-10-CM | POA: Diagnosis not present

## 2016-05-23 DIAGNOSIS — G894 Chronic pain syndrome: Secondary | ICD-10-CM | POA: Diagnosis not present

## 2016-05-23 DIAGNOSIS — Z1389 Encounter for screening for other disorder: Secondary | ICD-10-CM | POA: Diagnosis not present

## 2016-06-11 ENCOUNTER — Other Ambulatory Visit: Payer: Self-pay | Admitting: Family Medicine

## 2016-06-11 DIAGNOSIS — Z1231 Encounter for screening mammogram for malignant neoplasm of breast: Secondary | ICD-10-CM

## 2016-06-28 DIAGNOSIS — I10 Essential (primary) hypertension: Secondary | ICD-10-CM | POA: Diagnosis not present

## 2016-06-28 DIAGNOSIS — G894 Chronic pain syndrome: Secondary | ICD-10-CM | POA: Diagnosis not present

## 2016-06-28 DIAGNOSIS — D510 Vitamin B12 deficiency anemia due to intrinsic factor deficiency: Secondary | ICD-10-CM | POA: Diagnosis not present

## 2016-06-28 DIAGNOSIS — E039 Hypothyroidism, unspecified: Secondary | ICD-10-CM | POA: Diagnosis not present

## 2016-06-28 DIAGNOSIS — E538 Deficiency of other specified B group vitamins: Secondary | ICD-10-CM | POA: Diagnosis not present

## 2016-06-28 DIAGNOSIS — Z6834 Body mass index (BMI) 34.0-34.9, adult: Secondary | ICD-10-CM | POA: Diagnosis not present

## 2016-06-28 DIAGNOSIS — E559 Vitamin D deficiency, unspecified: Secondary | ICD-10-CM | POA: Diagnosis not present

## 2016-06-28 DIAGNOSIS — E6609 Other obesity due to excess calories: Secondary | ICD-10-CM | POA: Diagnosis not present

## 2016-07-04 DIAGNOSIS — H2512 Age-related nuclear cataract, left eye: Secondary | ICD-10-CM | POA: Diagnosis not present

## 2016-07-11 ENCOUNTER — Other Ambulatory Visit: Payer: Self-pay | Admitting: Gastroenterology

## 2016-07-11 DIAGNOSIS — R1084 Generalized abdominal pain: Secondary | ICD-10-CM

## 2016-07-11 DIAGNOSIS — R198 Other specified symptoms and signs involving the digestive system and abdomen: Secondary | ICD-10-CM | POA: Diagnosis not present

## 2016-07-11 DIAGNOSIS — R1013 Epigastric pain: Secondary | ICD-10-CM | POA: Diagnosis not present

## 2016-07-24 ENCOUNTER — Ambulatory Visit
Admission: RE | Admit: 2016-07-24 | Discharge: 2016-07-24 | Disposition: A | Discharge status: Home / Self Care | Payer: Medicare HMO | Source: Ambulatory Visit | Attending: Gastroenterology | Admitting: Gastroenterology

## 2016-07-24 DIAGNOSIS — I700 Atherosclerosis of aorta: Secondary | ICD-10-CM | POA: Insufficient documentation

## 2016-07-24 DIAGNOSIS — R109 Unspecified abdominal pain: Secondary | ICD-10-CM | POA: Diagnosis not present

## 2016-07-24 DIAGNOSIS — R194 Change in bowel habit: Secondary | ICD-10-CM | POA: Diagnosis not present

## 2016-07-24 DIAGNOSIS — R198 Other specified symptoms and signs involving the digestive system and abdomen: Secondary | ICD-10-CM

## 2016-07-24 DIAGNOSIS — N261 Atrophy of kidney (terminal): Secondary | ICD-10-CM | POA: Insufficient documentation

## 2016-07-24 DIAGNOSIS — R1084 Generalized abdominal pain: Secondary | ICD-10-CM | POA: Diagnosis not present

## 2016-07-24 MED ORDER — IOPAMIDOL (ISOVUE-300) INJECTION 61%
100.0000 mL | Freq: Once | INTRAVENOUS | Status: AC | PRN
Start: 2016-07-24 — End: 2016-07-24
  Administered 2016-07-24: 100 mL via INTRAVENOUS

## 2016-07-25 ENCOUNTER — Ambulatory Visit
Admission: RE | Admit: 2016-07-25 | Discharge: 2016-07-25 | Disposition: A | Discharge status: Home / Self Care | Payer: Commercial Managed Care - HMO | Source: Ambulatory Visit | Attending: Family Medicine | Admitting: Family Medicine

## 2016-07-25 DIAGNOSIS — Z1231 Encounter for screening mammogram for malignant neoplasm of breast: Secondary | ICD-10-CM

## 2016-07-26 DIAGNOSIS — G894 Chronic pain syndrome: Secondary | ICD-10-CM | POA: Diagnosis not present

## 2016-07-26 DIAGNOSIS — E039 Hypothyroidism, unspecified: Secondary | ICD-10-CM | POA: Diagnosis not present

## 2016-07-26 DIAGNOSIS — E6609 Other obesity due to excess calories: Secondary | ICD-10-CM | POA: Diagnosis not present

## 2016-07-26 DIAGNOSIS — Z6834 Body mass index (BMI) 34.0-34.9, adult: Secondary | ICD-10-CM | POA: Diagnosis not present

## 2016-08-07 ENCOUNTER — Emergency Department
Admission: EM | Admit: 2016-08-07 | Discharge: 2016-08-07 | Disposition: A | Discharge status: Home / Self Care | Payer: Medicare HMO | Attending: Emergency Medicine | Admitting: Emergency Medicine

## 2016-08-07 ENCOUNTER — Encounter: Payer: Self-pay | Admitting: Emergency Medicine

## 2016-08-07 DIAGNOSIS — N3000 Acute cystitis without hematuria: Secondary | ICD-10-CM | POA: Insufficient documentation

## 2016-08-07 DIAGNOSIS — R300 Dysuria: Secondary | ICD-10-CM | POA: Diagnosis present

## 2016-08-07 DIAGNOSIS — E039 Hypothyroidism, unspecified: Secondary | ICD-10-CM | POA: Insufficient documentation

## 2016-08-07 DIAGNOSIS — I10 Essential (primary) hypertension: Secondary | ICD-10-CM | POA: Insufficient documentation

## 2016-08-07 LAB — URINALYSIS, COMPLETE (UACMP) WITH MICROSCOPIC
BILIRUBIN URINE: NEGATIVE
GLUCOSE, UA: NEGATIVE mg/dL
KETONES UR: NEGATIVE mg/dL
NITRITE: POSITIVE — AB
PH: 6 (ref 5.0–8.0)
PROTEIN: NEGATIVE mg/dL
Specific Gravity, Urine: 1.002 — ABNORMAL LOW (ref 1.005–1.030)
Squamous Epithelial / LPF: NONE SEEN

## 2016-08-07 LAB — CBC
HCT: 41.1 % (ref 35.0–47.0)
HEMOGLOBIN: 13.8 g/dL (ref 12.0–16.0)
MCH: 31.3 pg (ref 26.0–34.0)
MCHC: 33.7 g/dL (ref 32.0–36.0)
MCV: 92.9 fL (ref 80.0–100.0)
PLATELETS: 227 10*3/uL (ref 150–440)
RBC: 4.42 MIL/uL (ref 3.80–5.20)
RDW: 13.5 % (ref 11.5–14.5)
WBC: 11.4 10*3/uL — ABNORMAL HIGH (ref 3.6–11.0)

## 2016-08-07 LAB — BASIC METABOLIC PANEL
Anion gap: 8 (ref 5–15)
BUN: 13 mg/dL (ref 6–20)
CO2: 24 mmol/L (ref 22–32)
CREATININE: 1.11 mg/dL — AB (ref 0.44–1.00)
Calcium: 9.3 mg/dL (ref 8.9–10.3)
Chloride: 103 mmol/L (ref 101–111)
GFR, EST AFRICAN AMERICAN: 55 mL/min — AB (ref >60)
GFR, EST NON AFRICAN AMERICAN: 48 mL/min — AB (ref >60)
Glucose, Bld: 115 mg/dL — ABNORMAL HIGH (ref 65–99)
Potassium: 4.3 mmol/L (ref 3.5–5.1)
SODIUM: 135 mmol/L (ref 135–145)

## 2016-08-07 MED ORDER — PHENAZOPYRIDINE HCL 100 MG PO TABS: 100.0000 mg | ORAL_TABLET | Freq: Three times a day (TID) | ORAL | 0 refills | Status: AC | PRN

## 2016-08-07 MED ORDER — PHENAZOPYRIDINE HCL 100 MG PO TABS
95.0000 mg | ORAL_TABLET | Freq: Once | ORAL | Status: AC
Start: 2016-08-07 — End: 2016-08-07
  Administered 2016-08-07: 100 mg via ORAL
  Filled 2016-08-07: qty 1

## 2016-08-07 MED ORDER — CEPHALEXIN 500 MG PO CAPS
500.0000 mg | ORAL_CAPSULE | Freq: Once | ORAL | Status: AC
  Administered 2016-08-07: 500 mg via ORAL

## 2016-08-07 MED ORDER — KETOROLAC TROMETHAMINE 60 MG/2ML IM SOLN
30.0000 mg | Freq: Once | INTRAMUSCULAR | Status: AC
  Administered 2016-08-07: 30 mg via INTRAMUSCULAR

## 2016-08-07 MED ORDER — CEPHALEXIN 500 MG PO CAPS: 500.0000 mg | ORAL_CAPSULE | Freq: Four times a day (QID) | ORAL | 0 refills | Status: AC

## 2016-08-07 NOTE — ED Triage Notes (Signed)
Pt reports dysuria and suprapubic pain since last night. C/o burning with urination. Pt states she has been taking AZO OTC and drinking more cranberry juice but has a sensation of full bladder. Pt urinated 350cc of clear orange urine in triage.

## 2016-08-07 NOTE — Discharge Instructions (Signed)
Please drink plenty of fluid to stay well-hydrated. Take the entire course of antibiotics, even if you're feeling better.  You may take Pyridium for bladder spasm. At home, you may use your oxycodone, or Tylenol, for your pain.  Return to the emergency department if you develop severe pain, fever, inability to keep down fluids, or any other symptoms concerning to you.

## 2016-08-07 NOTE — ED Provider Notes (Signed)
[facility] Emergency Department Provider Note  ____________________________________________  Time seen: Approximately 6:27 PM  I have reviewed the triage vital signs and the nursing notes.   HISTORY  Chief Complaint Dysuria    HPI Madison Jennings is a 76 y.o. female presenting with suprapubic and lower pelvic pain, dysuria, urinary frequency, and incomplete voiding since yesterday. The patient denies any systemic illness including fever, chills, nausea or vomiting. Not noticed any blood in her urine. She is not having any flank pain. No diarrhea or constipation. She has tried Azoand drinking cranberry juice, as well as oxycodone, with no relief of her symptoms.   Past Medical History:  Diagnosis Date  . Arthritis   . Chronic back pain   . Chronic back pain    scoliosis and 2 buldging disc  . Diverticulosis   . Fibromyalgia   . GERD (gastroesophageal reflux disease)    takes Protonix daily  . Headache(784.0)    sinus related  . Hyperlipidemia    just diagnosed;will follow up in 6wks to discuss meds  . Hypertension    takes Lisinopril daily  . Hypothyroidism    takes Synthroid daily  . Joint pain   . Nocturia   . Vitamin D deficiency    just placed on Vit D this week    Patient Active Problem List   Diagnosis Date Noted  . Pain in joint, shoulder region 11/22/2013  . Shoulder weakness 11/22/2013  . Diarrhea 05/02/2011  . Generalized weakness 05/02/2011  . Dehydration 05/02/2011  . HTN (hypertension) 05/02/2011  . Hypothyroidism 05/02/2011  . GERD (gastroesophageal reflux disease) 05/02/2011    Past Surgical History:  Procedure Laterality Date  . ABDOMINAL HYSTERECTOMY    . BACK SURGERY     x 3  . CARDIAC CATHETERIZATION  04/13/2010   normal cororanies, EF 55% ([facility]; Dr. Clayborn Bigness)  . CHOLECYSTECTOMY    . COLONOSCOPY    . FOOT SURGERY Left    d/t neuroma  . radiation to thyroid    . SPINAL CORD STIMULATOR INSERTION N/A 07/16/2013    Procedure: LUMBAR SPINAL CORD STIMULATOR INSERTION;  Surgeon: Bonna Gains, MD;  Location: [place];  Service: Neurosurgery;  Laterality: N/A;    Current Outpatient Rx  . Order #: [id number] Class: Historical Med  . Order #: [id number] Class: Print  . Order #: [id number] Class: Print  . Order #: [id number] Class: Historical Med  . Order #: [id number] Class: Historical Med  . Order #: [id number] Class: Historical Med  . Order #: [id number] Class: Print  . Order #: [id number] Class: Historical Med  . Order #: [id number] Class: Print    Allergies Patient has no known allergies.  Family History  Problem Relation Age of Onset  . Breast cancer Neg Hx     Social History Social History  Substance Use Topics  . Smoking status: Never Smoker  . Smokeless tobacco: Never Used  . Alcohol use No    Review of Systems Constitutional: No fever/chills. Eyes: No visual changes. ENT: No sore throat. No congestion or rhinorrhea. Cardiovascular: Denies chest pain. Denies palpitations. Respiratory: Denies shortness of breath.  No cough. Gastrointestinal: Positive suprapubic and lower pelvic pain..  No nausea, no vomiting.  No diarrhea.  No constipation. Genitourinary: Positive dysuria, incomplete voiding, and urinary frequency. Musculoskeletal: Negative for back pain. No flank pain. Skin: Negative for rash. Neurological: Negative for headaches. No focal numbness, tingling or weakness.     ____________________________________________   PHYSICAL EXAM:  VITAL SIGNS: ED Triage Vitals [08/07/16 1726]  Enc  Vitals Group     BP (!) 144/97     Pulse Rate 63     Resp (!) 22     Temp 97.6 F (36.4 C)     Temp Source Oral     SpO2 96 %     Weight 205 lb (93 kg)     Height 5\' 5"  (1.651 m)     Head Circumference      Peak Flow      Pain Score 7     Pain Loc      Pain Edu?      Excl. in [place]?     Constitutional: Alert and oriented. Well appearing and in no acute distress. Answers questions  appropriately. Eyes: Conjunctivae are normal.  EOMI. No scleral icterus. Head: Atraumatic. Nose: No congestion/rhinnorhea. Mouth/Throat: Mucous membranes are moist.  Neck: No stridor.  Supple.   Cardiovascular: Normal rate, regular rhythm. No murmurs, rubs or gallops.  Respiratory: Normal respiratory effort.  No accessory muscle use or retractions. Lungs CTAB.  No wheezes, rales or ronchi. Gastrointestinal: Obese. Soft, and nondistended. Mild tenderness to palpation in the suprapubic region, left greater than right pelvic region. No guarding or rebound.  No peritoneal signs. Musculoskeletal: No LE edema.  Neurologic:  A&Ox3.  Speech is clear.  Face and smile are symmetric.  EOMI.  Moves all extremities well. Skin:  Skin is warm, dry and intact. No rash noted. Psychiatric: Mood and affect are normal. Speech and behavior are normal.  Normal judgement.  ____________________________________________   LABS (all labs ordered are listed, but only abnormal results are displayed)  Labs Reviewed  URINALYSIS, COMPLETE (UACMP) WITH MICROSCOPIC - Abnormal; Notable for the following:       Result Value   Color, Urine AMBER (*)    APPearance CLEAR (*)    Specific Gravity, Urine 1.002 (*)    Hgb urine dipstick LARGE (*)    Nitrite POSITIVE (*)    Leukocytes, UA MODERATE (*)    Bacteria, UA RARE (*)    All other components within normal limits  CBC - Abnormal; Notable for the following:    WBC 11.4 (*)    All other components within normal limits  BASIC METABOLIC PANEL - Abnormal; Notable for the following:    Glucose, Bld 115 (*)    Creatinine, Ser 1.11 (*)    GFR calc non Af Amer 48 (*)    GFR calc Af Amer 55 (*)    All other components within normal limits  URINE CULTURE   ____________________________________________  EKG  Not indicated ____________________________________________  RADIOLOGY  No results  found.  ____________________________________________   PROCEDURES  Procedure(s) performed: None  Procedures  Critical Care performed: No ____________________________________________   INITIAL IMPRESSION / ASSESSMENT AND PLAN / ED COURSE  Pertinent labs & imaging results that were available during my care of the patient were reviewed by me and considered in my medical decision making (see chart for details).  76 y.o. female presenting with dysuria, urinary frequency, and incomplete voiding since yesterday, without systemic symptoms. The patient is hemodynamically stable and does not have signs of systemic infection from her UTI. Her urinalysis is consistent with a UTI; she has a previous culture from 2014 which shows gram-positive cocci but no susceptibility information. I will plan to treat the patient with Keflex. Today, she has a stable creatinine I'll treat her with a half dose of Toradol here as well as Pyridium, and encourage her to continue Tylenol, Pyridium, and oxycodone  as needed at home. It is Ruthann Cancer second of the patient's symptoms come from ovarian pathology, pyelonephritis, renal colic, aortic pathology, or GI pathology. She will follow-up with her primary care physician. Return precautions were discussed.  ____________________________________________  FINAL CLINICAL IMPRESSION(S) / ED DIAGNOSES  Final diagnoses:  Acute cystitis without hematuria         NEW MEDICATIONS STARTED DURING THIS VISIT:  New Prescriptions   CEPHALEXIN (KEFLEX) 500 MG CAPSULE    Take 1 capsule (500 mg total) by mouth 4 (four) times daily.   PHENAZOPYRIDINE (PYRIDIUM) 100 MG TABLET    Take 1 tablet (100 mg total) by mouth 3 (three) times daily as needed for pain.      Eula Listen, MD 08/07/16 [phone]

## 2016-08-08 LAB — URINE CULTURE

## 2016-08-09 DIAGNOSIS — Z6834 Body mass index (BMI) 34.0-34.9, adult: Secondary | ICD-10-CM | POA: Diagnosis not present

## 2016-08-09 DIAGNOSIS — R350 Frequency of micturition: Secondary | ICD-10-CM | POA: Diagnosis not present

## 2016-08-09 DIAGNOSIS — N342 Other urethritis: Secondary | ICD-10-CM | POA: Diagnosis not present

## 2016-08-09 DIAGNOSIS — Z1389 Encounter for screening for other disorder: Secondary | ICD-10-CM | POA: Diagnosis not present

## 2016-08-09 DIAGNOSIS — E6609 Other obesity due to excess calories: Secondary | ICD-10-CM | POA: Diagnosis not present

## 2016-08-14 DIAGNOSIS — R350 Frequency of micturition: Secondary | ICD-10-CM | POA: Diagnosis not present

## 2016-08-14 DIAGNOSIS — E6609 Other obesity due to excess calories: Secondary | ICD-10-CM | POA: Diagnosis not present

## 2016-08-14 DIAGNOSIS — N342 Other urethritis: Secondary | ICD-10-CM | POA: Diagnosis not present

## 2016-08-14 DIAGNOSIS — Z6834 Body mass index (BMI) 34.0-34.9, adult: Secondary | ICD-10-CM | POA: Diagnosis not present

## 2016-08-26 DIAGNOSIS — Z1389 Encounter for screening for other disorder: Secondary | ICD-10-CM | POA: Diagnosis not present

## 2016-08-26 DIAGNOSIS — Z6834 Body mass index (BMI) 34.0-34.9, adult: Secondary | ICD-10-CM | POA: Diagnosis not present

## 2016-08-26 DIAGNOSIS — G894 Chronic pain syndrome: Secondary | ICD-10-CM | POA: Diagnosis not present

## 2016-08-26 DIAGNOSIS — R0781 Pleurodynia: Secondary | ICD-10-CM | POA: Diagnosis not present

## 2016-08-26 DIAGNOSIS — E039 Hypothyroidism, unspecified: Secondary | ICD-10-CM | POA: Diagnosis not present

## 2016-09-03 DIAGNOSIS — R1031 Right lower quadrant pain: Secondary | ICD-10-CM | POA: Diagnosis not present

## 2016-09-03 DIAGNOSIS — R197 Diarrhea, unspecified: Secondary | ICD-10-CM | POA: Diagnosis not present

## 2016-09-03 DIAGNOSIS — R1032 Left lower quadrant pain: Secondary | ICD-10-CM | POA: Diagnosis not present

## 2016-09-03 DIAGNOSIS — R1013 Epigastric pain: Secondary | ICD-10-CM | POA: Diagnosis not present

## 2016-09-05 ENCOUNTER — Other Ambulatory Visit
Admission: RE | Admit: 2016-09-05 | Discharge: 2016-09-05 | Disposition: A | Discharge status: Home / Self Care | Payer: Medicare HMO | Source: Ambulatory Visit | Attending: Gastroenterology | Admitting: Gastroenterology

## 2016-09-05 DIAGNOSIS — R197 Diarrhea, unspecified: Secondary | ICD-10-CM | POA: Diagnosis not present

## 2016-09-05 LAB — GASTROINTESTINAL PANEL BY PCR, STOOL (REPLACES STOOL CULTURE)
Adenovirus F40/41: NOT DETECTED
Astrovirus: NOT DETECTED
Campylobacter species: NOT DETECTED
Cryptosporidium: NOT DETECTED
Cyclospora cayetanensis: NOT DETECTED
ENTEROAGGREGATIVE E COLI (EAEC): NOT DETECTED
ENTEROTOXIGENIC E COLI (ETEC): NOT DETECTED
Entamoeba histolytica: NOT DETECTED
Enteropathogenic E coli (EPEC): NOT DETECTED
GIARDIA LAMBLIA: NOT DETECTED
NOROVIRUS GI/GII: NOT DETECTED
Plesimonas shigelloides: NOT DETECTED
ROTAVIRUS A: NOT DETECTED
SALMONELLA SPECIES: NOT DETECTED
SAPOVIRUS (I, II, IV, AND V): NOT DETECTED
SHIGA LIKE TOXIN PRODUCING E COLI (STEC): NOT DETECTED
SHIGELLA/ENTEROINVASIVE E COLI (EIEC): NOT DETECTED
Vibrio cholerae: NOT DETECTED
Vibrio species: NOT DETECTED
Yersinia enterocolitica: NOT DETECTED

## 2016-09-05 LAB — CLOSTRIDIUM DIFFICILE BY PCR: Toxigenic C. Difficile by PCR: NEGATIVE

## 2016-09-05 LAB — C DIFFICILE QUICK SCREEN W PCR REFLEX
C DIFFICILE (CDIFF) TOXIN: NEGATIVE
C DIFFICLE (CDIFF) ANTIGEN: POSITIVE — AB

## 2016-09-19 ENCOUNTER — Ambulatory Visit (HOSPITAL_COMMUNITY)
Admission: RE | Admit: 2016-09-19 | Discharge: 2016-09-19 | Disposition: A | Discharge status: Home / Self Care | Payer: Commercial Managed Care - HMO | Source: Ambulatory Visit | Attending: Family Medicine | Admitting: Family Medicine

## 2016-09-19 ENCOUNTER — Other Ambulatory Visit (HOSPITAL_COMMUNITY): Payer: Self-pay | Admitting: Family Medicine

## 2016-09-19 DIAGNOSIS — R109 Unspecified abdominal pain: Secondary | ICD-10-CM | POA: Diagnosis not present

## 2016-09-19 DIAGNOSIS — N261 Atrophy of kidney (terminal): Secondary | ICD-10-CM | POA: Diagnosis not present

## 2016-09-19 DIAGNOSIS — R350 Frequency of micturition: Secondary | ICD-10-CM

## 2016-09-19 DIAGNOSIS — Z6833 Body mass index (BMI) 33.0-33.9, adult: Secondary | ICD-10-CM | POA: Diagnosis not present

## 2016-09-19 DIAGNOSIS — E6609 Other obesity due to excess calories: Secondary | ICD-10-CM | POA: Diagnosis not present

## 2016-10-07 DIAGNOSIS — R531 Weakness: Secondary | ICD-10-CM | POA: Diagnosis not present

## 2016-10-07 DIAGNOSIS — E669 Obesity, unspecified: Secondary | ICD-10-CM | POA: Diagnosis not present

## 2016-10-07 DIAGNOSIS — R002 Palpitations: Secondary | ICD-10-CM | POA: Diagnosis not present

## 2016-10-07 DIAGNOSIS — R001 Bradycardia, unspecified: Secondary | ICD-10-CM | POA: Diagnosis not present

## 2016-10-07 DIAGNOSIS — I208 Other forms of angina pectoris: Secondary | ICD-10-CM | POA: Diagnosis not present

## 2016-10-07 DIAGNOSIS — E079 Disorder of thyroid, unspecified: Secondary | ICD-10-CM | POA: Diagnosis not present

## 2016-10-07 DIAGNOSIS — K219 Gastro-esophageal reflux disease without esophagitis: Secondary | ICD-10-CM | POA: Diagnosis not present

## 2016-10-07 DIAGNOSIS — R0602 Shortness of breath: Secondary | ICD-10-CM | POA: Diagnosis not present

## 2016-10-16 DIAGNOSIS — I208 Other forms of angina pectoris: Secondary | ICD-10-CM | POA: Diagnosis not present

## 2016-10-16 DIAGNOSIS — K219 Gastro-esophageal reflux disease without esophagitis: Secondary | ICD-10-CM | POA: Diagnosis not present

## 2016-10-16 DIAGNOSIS — R0602 Shortness of breath: Secondary | ICD-10-CM | POA: Diagnosis not present

## 2016-10-16 DIAGNOSIS — I480 Paroxysmal atrial fibrillation: Secondary | ICD-10-CM | POA: Diagnosis not present

## 2016-10-16 DIAGNOSIS — R002 Palpitations: Secondary | ICD-10-CM | POA: Diagnosis not present

## 2016-10-16 DIAGNOSIS — R001 Bradycardia, unspecified: Secondary | ICD-10-CM | POA: Diagnosis not present

## 2016-10-16 DIAGNOSIS — E079 Disorder of thyroid, unspecified: Secondary | ICD-10-CM | POA: Diagnosis not present

## 2016-10-16 DIAGNOSIS — E669 Obesity, unspecified: Secondary | ICD-10-CM | POA: Diagnosis not present

## 2016-10-16 DIAGNOSIS — R531 Weakness: Secondary | ICD-10-CM | POA: Diagnosis not present

## 2016-10-17 DIAGNOSIS — E890 Postprocedural hypothyroidism: Secondary | ICD-10-CM | POA: Diagnosis not present

## 2016-10-17 DIAGNOSIS — R002 Palpitations: Secondary | ICD-10-CM | POA: Diagnosis not present

## 2016-10-21 DIAGNOSIS — R002 Palpitations: Secondary | ICD-10-CM | POA: Diagnosis not present

## 2016-10-22 ENCOUNTER — Encounter
Admission: RE | Disposition: A | Discharge status: Home / Self Care | Payer: Self-pay | Source: Ambulatory Visit | Attending: Internal Medicine

## 2016-10-22 ENCOUNTER — Ambulatory Visit
Admission: RE | Admit: 2016-10-22 | Discharge: 2016-10-22 | Disposition: A | Discharge status: Home / Self Care | Payer: Medicare HMO | Source: Ambulatory Visit | Attending: Internal Medicine | Admitting: Internal Medicine

## 2016-10-22 DIAGNOSIS — F329 Major depressive disorder, single episode, unspecified: Secondary | ICD-10-CM | POA: Diagnosis not present

## 2016-10-22 DIAGNOSIS — R0602 Shortness of breath: Secondary | ICD-10-CM | POA: Insufficient documentation

## 2016-10-22 DIAGNOSIS — Z79890 Hormone replacement therapy (postmenopausal): Secondary | ICD-10-CM | POA: Insufficient documentation

## 2016-10-22 DIAGNOSIS — I10 Essential (primary) hypertension: Secondary | ICD-10-CM | POA: Diagnosis not present

## 2016-10-22 DIAGNOSIS — I509 Heart failure, unspecified: Secondary | ICD-10-CM | POA: Diagnosis not present

## 2016-10-22 DIAGNOSIS — R0689 Other abnormalities of breathing: Secondary | ICD-10-CM | POA: Diagnosis not present

## 2016-10-22 DIAGNOSIS — Z87891 Personal history of nicotine dependence: Secondary | ICD-10-CM | POA: Insufficient documentation

## 2016-10-22 DIAGNOSIS — E039 Hypothyroidism, unspecified: Secondary | ICD-10-CM | POA: Diagnosis not present

## 2016-10-22 DIAGNOSIS — K219 Gastro-esophageal reflux disease without esophagitis: Secondary | ICD-10-CM | POA: Insufficient documentation

## 2016-10-22 DIAGNOSIS — J45909 Unspecified asthma, uncomplicated: Secondary | ICD-10-CM | POA: Diagnosis not present

## 2016-10-22 DIAGNOSIS — I5089 Other heart failure: Secondary | ICD-10-CM | POA: Diagnosis not present

## 2016-10-22 DIAGNOSIS — R0789 Other chest pain: Secondary | ICD-10-CM | POA: Insufficient documentation

## 2016-10-22 DIAGNOSIS — G8929 Other chronic pain: Secondary | ICD-10-CM | POA: Insufficient documentation

## 2016-10-22 DIAGNOSIS — I5189 Other ill-defined heart diseases: Secondary | ICD-10-CM | POA: Diagnosis not present

## 2016-10-22 DIAGNOSIS — R001 Bradycardia, unspecified: Secondary | ICD-10-CM | POA: Insufficient documentation

## 2016-10-22 DIAGNOSIS — E669 Obesity, unspecified: Secondary | ICD-10-CM | POA: Diagnosis not present

## 2016-10-22 DIAGNOSIS — Z79899 Other long term (current) drug therapy: Secondary | ICD-10-CM | POA: Insufficient documentation

## 2016-10-22 DIAGNOSIS — Z8249 Family history of ischemic heart disease and other diseases of the circulatory system: Secondary | ICD-10-CM | POA: Insufficient documentation

## 2016-10-22 DIAGNOSIS — R002 Palpitations: Secondary | ICD-10-CM | POA: Diagnosis not present

## 2016-10-22 DIAGNOSIS — I209 Angina pectoris, unspecified: Secondary | ICD-10-CM | POA: Diagnosis not present

## 2016-10-22 HISTORY — PX: RIGHT/LEFT HEART CATH AND CORONARY ANGIOGRAPHY: CATH118266

## 2016-10-22 LAB — CARDIAC CATHETERIZATION: CATHEFQUANT: 55 %

## 2016-10-22 SURGERY — RIGHT/LEFT HEART CATH AND CORONARY ANGIOGRAPHY
Anesthesia: Moderate Sedation

## 2016-10-22 SURGERY — RIGHT/LEFT HEART CATH AND CORONARY ANGIOGRAPHY
Anesthesia: Moderate Sedation | Laterality: Bilateral

## 2016-10-22 MED ORDER — SODIUM CHLORIDE 0.9% FLUSH: 3.0000 mL | Freq: Two times a day (BID) | INTRAVENOUS | Status: DC

## 2016-10-22 MED ORDER — SODIUM CHLORIDE 0.9 % WEIGHT BASED INFUSION
279.0000 mL/h | INTRAVENOUS | Status: AC
  Administered 2016-10-22: 3 mL/kg/h via INTRAVENOUS

## 2016-10-22 MED ORDER — SODIUM CHLORIDE 0.9% FLUSH: 3.0000 mL | INTRAVENOUS | Status: DC | PRN

## 2016-10-22 MED ORDER — ACETAMINOPHEN 325 MG PO TABS: 650.0000 mg | ORAL_TABLET | ORAL | Status: DC | PRN

## 2016-10-22 MED ORDER — HEPARIN (PORCINE) IN NACL 2-0.9 UNIT/ML-% IJ SOLN
INTRAMUSCULAR | Status: AC
Start: 2016-10-22 — End: ?
  Filled 2016-10-22: qty 500

## 2016-10-22 MED ORDER — MIDAZOLAM HCL 2 MG/2ML IJ SOLN
INTRAMUSCULAR | Status: DC | PRN
  Administered 2016-10-22: 1 mg via INTRAVENOUS

## 2016-10-22 MED ORDER — FENTANYL CITRATE (PF) 100 MCG/2ML IJ SOLN
INTRAMUSCULAR | Status: DC | PRN
  Administered 2016-10-22: 50 ug via INTRAVENOUS

## 2016-10-22 MED ORDER — IOPAMIDOL (ISOVUE-300) INJECTION 61%
INTRAVENOUS | Status: DC | PRN
  Administered 2016-10-22: 70 mL via INTRA_ARTERIAL

## 2016-10-22 MED ORDER — MIDAZOLAM HCL 2 MG/2ML IJ SOLN
INTRAMUSCULAR | Status: AC
  Filled 2016-10-22: qty 2

## 2016-10-22 MED ORDER — ASPIRIN 81 MG PO CHEW: 81.0000 mg | CHEWABLE_TABLET | ORAL | Status: DC

## 2016-10-22 MED ORDER — ONDANSETRON HCL 4 MG/2ML IJ SOLN: 4.0000 mg | Freq: Four times a day (QID) | INTRAMUSCULAR | Status: DC | PRN

## 2016-10-22 MED ORDER — SODIUM CHLORIDE 0.9 % WEIGHT BASED INFUSION: 1.0000 mL/kg/h | INTRAVENOUS | Status: DC

## 2016-10-22 MED ORDER — FENTANYL CITRATE (PF) 100 MCG/2ML IJ SOLN
INTRAMUSCULAR | Status: AC
  Filled 2016-10-22: qty 2

## 2016-10-22 MED ORDER — SODIUM CHLORIDE 0.9 % IV SOLN: 250.0000 mL | INTRAVENOUS | Status: DC | PRN

## 2016-10-22 MED ORDER — SODIUM CHLORIDE 0.9 % WEIGHT BASED INFUSION: 93.0000 mL/h | INTRAVENOUS | Status: DC

## 2016-10-22 MED ORDER — LIDOCAINE HCL (PF) 1 % IJ SOLN
INTRAMUSCULAR | Status: AC
  Filled 2016-10-22: qty 30

## 2016-10-22 SURGICAL SUPPLY — 13 items
CATH 5FR JL4 DIAGNOSTIC (CATHETERS) ×2 IMPLANT
CATH INFINITI 5FR ANG PIGTAIL (CATHETERS) ×2 IMPLANT
CATH INFINITI JR4 5F (CATHETERS) ×2 IMPLANT
CATH SWANZ 7F THERMO (CATHETERS) ×2 IMPLANT
DEVICE CLOSURE MYNXGRIP 5F (Vascular Products) ×2 IMPLANT
KIT MANI 3VAL PERCEP (MISCELLANEOUS) ×3 IMPLANT
KIT RIGHT HEART (MISCELLANEOUS) ×3 IMPLANT
NDL PERC 18GX7CM (NEEDLE) IMPLANT
NEEDLE PERC 18GX7CM (NEEDLE) ×3 IMPLANT
PACK CARDIAC CATH (CUSTOM PROCEDURE TRAY) ×3 IMPLANT
SHEATH AVANTI 5FR X 11CM (SHEATH) ×2 IMPLANT
SHEATH PINNACLE 7F 10CM (SHEATH) ×2 IMPLANT
WIRE EMERALD 3MM-J .035X150CM (WIRE) ×2 IMPLANT

## 2016-10-22 NOTE — Progress Notes (Signed)
Pt. Assisted OOB and taken via w/c to BR. Pt. Voided and taken back to bedside. Right groin clean, dry, intact without complications. DC instructions reviewed with pt. And spouse with verbalized understanding. Stable for DC home.

## 2016-10-22 NOTE — Discharge Instructions (Signed)
Groin Insertion Instructions-If you lose feeling or develop tingling or pain in your leg or foot after the procedure, please walk around first.  If the discomfort does not improve , contact your physician and proceed to the nearest emergency room.  Loss of feeling in your leg might mean that a blockage has formed in the artery and this can be appropriately treated.  Limit your activity for the next two days after your procedure.  Avoid stooping, bending, heavy lifting or exertion as this may put pressure on the insertion site.  Resume normal activities in 48 hours.  You may shower after 24 hours but avoid excessive warm water and do not scrub the site.  Remove clear dressing in 48 hours.  If you have had a closure device inserted, do not soak in a tub bath or a hot tub for at least one week. ° °No driving for 48 hours after discharge.  After the procedure, check the insertion site occasionally.  If any oozing occurs or there is apparent swelling, firm pressure over the site will prevent a bruise from forming.  You can not hurt anything by pressing directly on the site.  The pressure stops the bleeding by allowing a small clot to form.  If the bleeding continues after the pressure has been applied for more than 15 minutes, call 911 or go to the nearest emergency room.   ° °The x-ray dye causes you to pass a considerate amount of urine.  For this reason, you will be asked to drink plenty of liquids after the procedure to prevent dehydration.  You may resume you regular diet.  Avoid caffeine products.   ° °For pain at the site of your procedure, take non-aspirin medicines such as Tylenol. ° °Medications: A. Hold Metformin for 48 hours if applicable.  B. Continue taking all your present medications at home unless your doctor prescribes any changes. ° °Moderate Conscious Sedation, Adult, Care After °These instructions provide you with information about caring for yourself after your procedure. Your health care provider  may also give you more specific instructions. Your treatment has been planned according to current medical practices, but problems sometimes occur. Call your health care provider if you have any problems or questions after your procedure. °What can I expect after the procedure? °After your procedure, it is common: °· To feel sleepy for several hours. °· To feel clumsy and have poor balance for several hours. °· To have poor judgment for several hours. °· To vomit if you eat too soon. ° °Follow these instructions at home: °For at least 24 hours after the procedure: ° °· Do not: °? Participate in activities where you could fall or become injured. °? Drive. °? Use heavy machinery. °? Drink alcohol. °? Take sleeping pills or medicines that cause drowsiness. °? Make important decisions or sign legal documents. °? Take care of children on your own. °· Rest. °Eating and drinking °· Follow the diet recommended by your health care provider. °· If you vomit: °? Drink water, juice, or soup when you can drink without vomiting. °? Make sure you have little or no nausea before eating solid foods. °General instructions °· Have a responsible adult stay with you until you are awake and alert. °· Take over-the-counter and prescription medicines only as told by your health care provider. °· If you smoke, do not smoke without supervision. °· Keep all follow-up visits as told by your health care provider. This is important. °Contact a health care provider   if: °· You keep feeling nauseous or you keep vomiting. °· You feel light-headed. °· You develop a rash. °· You have a fever. °Get help right away if: °· You have trouble breathing. °This information is not intended to replace advice given to you by your health care provider. Make sure you discuss any questions you have with your health care provider. °Document Released: 12/02/2012 Document Revised: 07/17/2015 Document Reviewed: 06/03/2015 °Elsevier Interactive Patient Education © 2018  [employer] ° °

## 2016-10-23 ENCOUNTER — Encounter: Payer: Self-pay | Admitting: Internal Medicine

## 2016-10-31 DIAGNOSIS — R531 Weakness: Secondary | ICD-10-CM | POA: Diagnosis not present

## 2016-10-31 DIAGNOSIS — I208 Other forms of angina pectoris: Secondary | ICD-10-CM | POA: Diagnosis not present

## 2016-10-31 DIAGNOSIS — I480 Paroxysmal atrial fibrillation: Secondary | ICD-10-CM | POA: Diagnosis not present

## 2016-10-31 DIAGNOSIS — R002 Palpitations: Secondary | ICD-10-CM | POA: Diagnosis not present

## 2016-10-31 DIAGNOSIS — K219 Gastro-esophageal reflux disease without esophagitis: Secondary | ICD-10-CM | POA: Diagnosis not present

## 2016-10-31 DIAGNOSIS — R001 Bradycardia, unspecified: Secondary | ICD-10-CM | POA: Diagnosis not present

## 2016-10-31 DIAGNOSIS — E669 Obesity, unspecified: Secondary | ICD-10-CM | POA: Diagnosis not present

## 2016-10-31 DIAGNOSIS — E079 Disorder of thyroid, unspecified: Secondary | ICD-10-CM | POA: Diagnosis not present

## 2016-10-31 DIAGNOSIS — R0602 Shortness of breath: Secondary | ICD-10-CM | POA: Diagnosis not present

## 2016-11-05 DIAGNOSIS — K219 Gastro-esophageal reflux disease without esophagitis: Secondary | ICD-10-CM | POA: Diagnosis not present

## 2016-11-05 DIAGNOSIS — I10 Essential (primary) hypertension: Secondary | ICD-10-CM | POA: Diagnosis not present

## 2016-11-05 DIAGNOSIS — I119 Hypertensive heart disease without heart failure: Secondary | ICD-10-CM | POA: Diagnosis not present

## 2016-11-05 DIAGNOSIS — R079 Chest pain, unspecified: Secondary | ICD-10-CM | POA: Diagnosis not present

## 2016-11-27 DIAGNOSIS — Z23 Encounter for immunization: Secondary | ICD-10-CM | POA: Diagnosis not present

## 2016-11-27 DIAGNOSIS — M5440 Lumbago with sciatica, unspecified side: Secondary | ICD-10-CM | POA: Diagnosis not present

## 2016-11-27 DIAGNOSIS — I10 Essential (primary) hypertension: Secondary | ICD-10-CM | POA: Diagnosis not present

## 2016-11-27 DIAGNOSIS — K219 Gastro-esophageal reflux disease without esophagitis: Secondary | ICD-10-CM | POA: Diagnosis not present

## 2016-11-27 DIAGNOSIS — I119 Hypertensive heart disease without heart failure: Secondary | ICD-10-CM | POA: Diagnosis not present

## 2016-12-23 DIAGNOSIS — M5416 Radiculopathy, lumbar region: Secondary | ICD-10-CM | POA: Diagnosis not present

## 2016-12-23 DIAGNOSIS — M961 Postlaminectomy syndrome, not elsewhere classified: Secondary | ICD-10-CM | POA: Diagnosis not present

## 2016-12-23 DIAGNOSIS — G8921 Chronic pain due to trauma: Secondary | ICD-10-CM | POA: Diagnosis not present

## 2016-12-23 DIAGNOSIS — G8929 Other chronic pain: Secondary | ICD-10-CM | POA: Diagnosis not present

## 2016-12-23 DIAGNOSIS — G629 Polyneuropathy, unspecified: Secondary | ICD-10-CM | POA: Diagnosis not present

## 2016-12-23 DIAGNOSIS — F1120 Opioid dependence, uncomplicated: Secondary | ICD-10-CM | POA: Diagnosis not present

## 2016-12-23 DIAGNOSIS — G8928 Other chronic postprocedural pain: Secondary | ICD-10-CM | POA: Diagnosis not present

## 2016-12-23 DIAGNOSIS — M545 Low back pain: Secondary | ICD-10-CM | POA: Diagnosis not present

## 2016-12-23 DIAGNOSIS — Z79899 Other long term (current) drug therapy: Secondary | ICD-10-CM | POA: Diagnosis not present

## 2017-01-07 ENCOUNTER — Other Ambulatory Visit: Payer: Self-pay | Admitting: Anesthesiology

## 2017-01-07 DIAGNOSIS — M5416 Radiculopathy, lumbar region: Secondary | ICD-10-CM

## 2017-01-14 ENCOUNTER — Ambulatory Visit
Admission: RE | Admit: 2017-01-14 | Discharge: 2017-01-14 | Disposition: A | Discharge status: Home / Self Care | Payer: Medicare HMO | Source: Ambulatory Visit | Attending: Anesthesiology | Admitting: Anesthesiology

## 2017-01-14 DIAGNOSIS — M4805 Spinal stenosis, thoracolumbar region: Secondary | ICD-10-CM | POA: Diagnosis not present

## 2017-01-14 DIAGNOSIS — M5416 Radiculopathy, lumbar region: Secondary | ICD-10-CM | POA: Diagnosis present

## 2017-01-14 DIAGNOSIS — M5116 Intervertebral disc disorders with radiculopathy, lumbar region: Secondary | ICD-10-CM | POA: Insufficient documentation

## 2017-01-14 DIAGNOSIS — T84218A Breakdown (mechanical) of internal fixation device of other bones, initial encounter: Secondary | ICD-10-CM | POA: Insufficient documentation

## 2017-01-14 DIAGNOSIS — M5135 Other intervertebral disc degeneration, thoracolumbar region: Secondary | ICD-10-CM | POA: Diagnosis not present

## 2017-01-14 DIAGNOSIS — M48061 Spinal stenosis, lumbar region without neurogenic claudication: Secondary | ICD-10-CM | POA: Diagnosis not present

## 2017-01-14 DIAGNOSIS — Z981 Arthrodesis status: Secondary | ICD-10-CM | POA: Insufficient documentation

## 2017-01-14 DIAGNOSIS — Y838 Other surgical procedures as the cause of abnormal reaction of the patient, or of later complication, without mention of misadventure at the time of the procedure: Secondary | ICD-10-CM | POA: Diagnosis not present

## 2017-01-20 ENCOUNTER — Encounter: Payer: Self-pay | Admitting: *Deleted

## 2017-01-21 ENCOUNTER — Ambulatory Visit
Admission: RE | Admit: 2017-01-21 | Discharge: 2017-01-21 | Disposition: A | Discharge status: Home / Self Care | Payer: Medicare HMO | Source: Ambulatory Visit | Attending: Gastroenterology | Admitting: Gastroenterology

## 2017-01-21 ENCOUNTER — Encounter
Admission: RE | Disposition: A | Discharge status: Home / Self Care | Payer: Self-pay | Source: Ambulatory Visit | Attending: Gastroenterology

## 2017-01-21 ENCOUNTER — Ambulatory Visit: Payer: Medicare HMO | Admitting: Anesthesiology

## 2017-01-21 ENCOUNTER — Ambulatory Visit: Payer: Medicare HMO

## 2017-01-21 ENCOUNTER — Encounter: Payer: Self-pay | Admitting: Anesthesiology

## 2017-01-21 DIAGNOSIS — Z79890 Hormone replacement therapy (postmenopausal): Secondary | ICD-10-CM | POA: Diagnosis not present

## 2017-01-21 DIAGNOSIS — R197 Diarrhea, unspecified: Secondary | ICD-10-CM | POA: Diagnosis not present

## 2017-01-21 DIAGNOSIS — Z87891 Personal history of nicotine dependence: Secondary | ICD-10-CM | POA: Diagnosis not present

## 2017-01-21 DIAGNOSIS — K210 Gastro-esophageal reflux disease with esophagitis: Secondary | ICD-10-CM | POA: Insufficient documentation

## 2017-01-21 DIAGNOSIS — Z6835 Body mass index (BMI) 35.0-35.9, adult: Secondary | ICD-10-CM | POA: Insufficient documentation

## 2017-01-21 DIAGNOSIS — R1030 Lower abdominal pain, unspecified: Secondary | ICD-10-CM

## 2017-01-21 DIAGNOSIS — E559 Vitamin D deficiency, unspecified: Secondary | ICD-10-CM | POA: Insufficient documentation

## 2017-01-21 DIAGNOSIS — R1032 Left lower quadrant pain: Secondary | ICD-10-CM | POA: Insufficient documentation

## 2017-01-21 DIAGNOSIS — E669 Obesity, unspecified: Secondary | ICD-10-CM | POA: Diagnosis not present

## 2017-01-21 DIAGNOSIS — K2950 Unspecified chronic gastritis without bleeding: Secondary | ICD-10-CM | POA: Insufficient documentation

## 2017-01-21 DIAGNOSIS — R1013 Epigastric pain: Secondary | ICD-10-CM | POA: Diagnosis not present

## 2017-01-21 DIAGNOSIS — J45909 Unspecified asthma, uncomplicated: Secondary | ICD-10-CM | POA: Diagnosis not present

## 2017-01-21 DIAGNOSIS — K259 Gastric ulcer, unspecified as acute or chronic, without hemorrhage or perforation: Secondary | ICD-10-CM | POA: Insufficient documentation

## 2017-01-21 DIAGNOSIS — Z79899 Other long term (current) drug therapy: Secondary | ICD-10-CM | POA: Insufficient documentation

## 2017-01-21 DIAGNOSIS — K2960 Other gastritis without bleeding: Secondary | ICD-10-CM | POA: Diagnosis not present

## 2017-01-21 DIAGNOSIS — K5790 Diverticulosis of intestine, part unspecified, without perforation or abscess without bleeding: Secondary | ICD-10-CM | POA: Diagnosis not present

## 2017-01-21 DIAGNOSIS — I10 Essential (primary) hypertension: Secondary | ICD-10-CM | POA: Insufficient documentation

## 2017-01-21 DIAGNOSIS — K3189 Other diseases of stomach and duodenum: Secondary | ICD-10-CM | POA: Insufficient documentation

## 2017-01-21 DIAGNOSIS — E039 Hypothyroidism, unspecified: Secondary | ICD-10-CM | POA: Insufficient documentation

## 2017-01-21 DIAGNOSIS — M1990 Unspecified osteoarthritis, unspecified site: Secondary | ICD-10-CM | POA: Diagnosis not present

## 2017-01-21 DIAGNOSIS — K2900 Acute gastritis without bleeding: Secondary | ICD-10-CM | POA: Diagnosis not present

## 2017-01-21 DIAGNOSIS — K5730 Diverticulosis of large intestine without perforation or abscess without bleeding: Secondary | ICD-10-CM | POA: Insufficient documentation

## 2017-01-21 DIAGNOSIS — Q438 Other specified congenital malformations of intestine: Secondary | ICD-10-CM | POA: Insufficient documentation

## 2017-01-21 DIAGNOSIS — K219 Gastro-esophageal reflux disease without esophagitis: Secondary | ICD-10-CM | POA: Diagnosis not present

## 2017-01-21 DIAGNOSIS — F329 Major depressive disorder, single episode, unspecified: Secondary | ICD-10-CM | POA: Diagnosis not present

## 2017-01-21 HISTORY — DX: Other ill-defined heart diseases: I51.89

## 2017-01-21 HISTORY — DX: Major depressive disorder, single episode, unspecified: F32.9

## 2017-01-21 HISTORY — PX: COLONOSCOPY WITH PROPOFOL: SHX5780

## 2017-01-21 HISTORY — DX: Unspecified asthma, uncomplicated: J45.909

## 2017-01-21 HISTORY — DX: Cardiac arrhythmia, unspecified: I49.9

## 2017-01-21 HISTORY — DX: Gastritis, unspecified, without bleeding: K29.70

## 2017-01-21 HISTORY — DX: Depression, unspecified: F32.A

## 2017-01-21 HISTORY — DX: Obesity, unspecified: E66.9

## 2017-01-21 HISTORY — PX: ESOPHAGOGASTRODUODENOSCOPY (EGD) WITH PROPOFOL: SHX5813

## 2017-01-21 HISTORY — DX: Vascular disorder of intestine, unspecified: K55.9

## 2017-01-21 SURGERY — COLONOSCOPY WITH PROPOFOL
Anesthesia: General

## 2017-01-21 MED ORDER — SODIUM CHLORIDE 0.9 % IV SOLN: INTRAVENOUS | Status: DC

## 2017-01-21 MED ORDER — LIDOCAINE HCL (CARDIAC) 20 MG/ML IV SOLN
INTRAVENOUS | Status: DC | PRN
  Administered 2017-01-21: 50 mg via INTRAVENOUS

## 2017-01-21 MED ORDER — FENTANYL CITRATE (PF) 100 MCG/2ML IJ SOLN
INTRAMUSCULAR | Status: DC | PRN
  Administered 2017-01-21: 50 ug via INTRAVENOUS
  Administered 2017-01-21 (×2): 25 ug via INTRAVENOUS

## 2017-01-21 MED ORDER — ONDANSETRON HCL 4 MG/2ML IJ SOLN
INTRAMUSCULAR | Status: AC
  Administered 2017-01-21: 4 mg via INTRAVENOUS
  Filled 2017-01-21: qty 2

## 2017-01-21 MED ORDER — LIDOCAINE HCL (PF) 1 % IJ SOLN
INTRAMUSCULAR | Status: AC
  Administered 2017-01-21: 0.3 mL via INTRADERMAL
  Filled 2017-01-21: qty 2

## 2017-01-21 MED ORDER — FENTANYL CITRATE (PF) 100 MCG/2ML IJ SOLN
INTRAMUSCULAR | Status: AC
  Filled 2017-01-21: qty 2

## 2017-01-21 MED ORDER — MIDAZOLAM HCL 2 MG/2ML IJ SOLN
INTRAMUSCULAR | Status: AC
  Filled 2017-01-21: qty 2

## 2017-01-21 MED ORDER — LIDOCAINE HCL (PF) 1 % IJ SOLN
2.0000 mL | Freq: Once | INTRAMUSCULAR | Status: AC
  Administered 2017-01-21: 0.3 mL via INTRADERMAL

## 2017-01-21 MED ORDER — PROPOFOL 500 MG/50ML IV EMUL
INTRAVENOUS | Status: DC | PRN
  Administered 2017-01-21: 120 ug/kg/min via INTRAVENOUS

## 2017-01-21 MED ORDER — PROPOFOL 500 MG/50ML IV EMUL
INTRAVENOUS | Status: AC
  Filled 2017-01-21: qty 50

## 2017-01-21 MED ORDER — LIDOCAINE HCL (PF) 2 % IJ SOLN
INTRAMUSCULAR | Status: AC
  Filled 2017-01-21: qty 10

## 2017-01-21 MED ORDER — MIDAZOLAM HCL 2 MG/2ML IJ SOLN
INTRAMUSCULAR | Status: DC | PRN
  Administered 2017-01-21 (×2): 1 mg via INTRAVENOUS

## 2017-01-21 MED ORDER — SODIUM CHLORIDE 0.9 % IV SOLN
INTRAVENOUS | Status: DC
  Administered 2017-01-21: 1000 mL via INTRAVENOUS

## 2017-01-21 MED ORDER — EPHEDRINE SULFATE 50 MG/ML IJ SOLN
INTRAMUSCULAR | Status: DC | PRN
  Administered 2017-01-21: 10 mg via INTRAVENOUS

## 2017-01-21 NOTE — Anesthesia Post-op Follow-up Note (Signed)
Anesthesia QCDR form completed.        

## 2017-01-21 NOTE — Progress Notes (Signed)
Pt c/o abdominal pain post procedure.  MD notified.  Examined pt and ordered xrays.  Pt also c/o nausea ...zofran iv administered.  Taken to [facility] via wheelchair

## 2017-01-21 NOTE — Anesthesia Postprocedure Evaluation (Signed)
Anesthesia Post Note  Patient: Madison Jennings  Procedure(s) Performed: COLONOSCOPY WITH PROPOFOL (N/A ) ESOPHAGOGASTRODUODENOSCOPY (EGD) WITH PROPOFOL (N/A )  Patient location during evaluation: PACU Anesthesia Type: General Level of consciousness: awake Pain management: satisfactory to patient Vital Signs Assessment: post-procedure vital signs reviewed and stable Respiratory status: spontaneous breathing Cardiovascular status: stable Anesthetic complications: no     Last Vitals:  Vitals:   01/21/17 1214 01/21/17 1224  BP: 110/84 116/70  Pulse: 60 70  Resp: 13 16  Temp:    SpO2: 96% 97%    Last Pain:  Vitals:   01/21/17 1154  TempSrc: Tympanic  PainSc:                  VAN Jennings,Madison Antu

## 2017-01-21 NOTE — OR Nursing (Signed)
Colonoscopy incomplete, got to [facility].

## 2017-01-21 NOTE — Transfer of Care (Signed)
Immediate Anesthesia Transfer of Care Note  Patient: Madison Jennings  Procedure(s) Performed: COLONOSCOPY WITH PROPOFOL (N/A ) ESOPHAGOGASTRODUODENOSCOPY (EGD) WITH PROPOFOL (N/A )  Patient Location: PACU  Anesthesia Type:General  Level of Consciousness: awake and sedated  Airway & Oxygen Therapy: Patient Spontanous Breathing and Patient connected to nasal cannula oxygen  Post-op Assessment: Report given to RN and Post -op Vital signs reviewed and stable  Post vital signs: Reviewed and stable  Last Vitals:  Vitals:   01/21/17 0937  BP: (!) 158/63  Pulse: (!) 50  Resp: 20  Temp: (!) 35.8 C  SpO2: 100%    Last Pain:  Vitals:   01/21/17 0937  TempSrc: Tympanic  PainSc: 6          Complications: No apparent anesthesia complications

## 2017-01-21 NOTE — Anesthesia Procedure Notes (Signed)
Performed by: Cook-Martin, Shyenne Maggard Pre-anesthesia Checklist: Patient identified, Emergency Drugs available, Suction available, Patient being monitored and Timeout performed Patient Re-evaluated:Patient Re-evaluated prior to induction Oxygen Delivery Method: Nasal cannula Preoxygenation: Pre-oxygenation with 100% oxygen Induction Type: IV induction Airway Equipment and Method: Bite block Placement Confirmation: positive ETCO2 and CO2 detector       

## 2017-01-21 NOTE — Anesthesia Procedure Notes (Addendum)
Performed by: Cook-Martin, Zahava Quant Pre-anesthesia Checklist: Patient identified, Emergency Drugs available, Suction available, Patient being monitored and Timeout performed Patient Re-evaluated:Patient Re-evaluated prior to induction Oxygen Delivery Method: Nasal cannula Preoxygenation: Pre-oxygenation with 100% oxygen Induction Type: IV induction Airway Equipment and Method: Bite block Placement Confirmation: positive ETCO2 and CO2 detector       

## 2017-01-21 NOTE — Anesthesia Preprocedure Evaluation (Signed)
Anesthesia Evaluation  Patient identified by MRN, date of birth, ID band Patient awake    Reviewed: Allergy & Precautions, H&P , NPO status , Patient's Chart, lab work & pertinent test results  History of Anesthesia Complications Negative for: history of anesthetic complications  Airway Mallampati: I  TM Distance: >3 FB Neck ROM: Full    Dental  (+) Edentulous Upper, Dental Advisory Given   Pulmonary neg pulmonary ROS, asthma , former smoker,    Pulmonary exam normal        Cardiovascular hypertension, Pt. on medications Normal cardiovascular exam+ dysrhythmias      Neuro/Psych  Headaches, PSYCHIATRIC DISORDERS Depression negative psych ROS   GI/Hepatic Neg liver ROS, GERD  Medicated,  Endo/Other  Hypothyroidism   Renal/GU negative Renal ROS  negative genitourinary   Musculoskeletal  (+) Arthritis , Osteoarthritis,  Fibromyalgia -, narcotic dependent  Abdominal   Peds negative pediatric ROS (+)  Hematology   Anesthesia Other Findings Past Medical History: No date: Arthritis No date: Asthma No date: Chronic back pain No date: Chronic back pain     Comment:  scoliosis and 2 buldging disc No date: Depression No date: Diastolic dysfunction No date: Diverticulosis No date: Dysrhythmia     Comment:  PALPITATIONS No date: Fibromyalgia No date: Gastritis No date: GERD (gastroesophageal reflux disease)     Comment:  takes Protonix daily No date: Headache(784.0)     Comment:  sinus related No date: Hyperlipidemia     Comment:  just diagnosed;will follow up in 6wks to discuss meds No date: Hypertension     Comment:  takes Lisinopril daily No date: Hypothyroidism     Comment:  takes Synthroid daily No date: Ischemic colitis (HCC) No date: Joint pain No date: Nocturia No date: Obesity No date: Vitamin D deficiency     Comment:  just placed on Vit D this week  Reproductive/Obstetrics                              Anesthesia Physical  Anesthesia Plan  ASA: III  Anesthesia Plan: General   Post-op Pain Management:    Induction: Intravenous  PONV Risk Score and Plan:   Airway Management Planned: Nasal Cannula  Additional Equipment:   Intra-op Plan:   Post-operative Plan:   Informed Consent: I have reviewed the patients History and Physical, chart, labs and discussed the procedure including the risks, benefits and alternatives for the proposed anesthesia with the patient or authorized representative who has indicated his/her understanding and acceptance.   Dental advisory given  Plan Discussed with: CRNA, Anesthesiologist and Surgeon  Anesthesia Plan Comments:         Anesthesia Quick Evaluation

## 2017-01-21 NOTE — H&P (Signed)
Outpatient short stay form Pre-procedure 01/21/2017 10:47 AM Lollie Sails MD  Primary Physician: Dr Redmond School  Reason for visit:  EGD and colonoscopy  History of present illness:  Patient is a 76 year old female presenting today as above. She has a history of some change of bowel habits the past several months with increasing diarrhea associated also with some left lower quadrant discomfort. Further she's been having some epigastric discomfort and bloating. She does have a history of a cholecystectomy over 10 years ago. She tolerated her prep well. She is currently taking pantoprazole 40 mg daily. She takes no aspirin or blood thinning agents.    Current Facility-Administered Medications:  .  0.9 %  sodium chloride infusion, , Intravenous, Continuous, Lollie Sails, MD, Last Rate: 20 mL/hr at 01/21/17 1000, 1,000 mL at 01/21/17 1000 .  0.9 %  sodium chloride infusion, , Intravenous, Continuous, Lollie Sails, MD  Facility-Administered Medications Ordered in Other Encounters:  .  lidocaine (cardiac) 100 mg/11ml (XYLOCAINE) 20 MG/ML injection 2%, , , Anesthesia Intra-op, Cook-Dameka Younker, Cindy, 50 mg at 01/21/17 1046  Medications Prior to Admission  Medication Sig Dispense Refill Last Dose  . estradiol (ESTRACE) 0.1 MG/GM vaginal cream Place 1 Applicatorful vaginally at bedtime.     Marland Kitchen oxycodone (OXY-IR) 5 MG capsule Take 5 mg by mouth every 4 (four) hours as needed.     Marland Kitchen acetaminophen (TYLENOL) 500 MG tablet Take 1,000 mg by mouth every 6 (six) hours as needed (for pain.).   10/21/2016 at Unknown time  . Cholecalciferol (VITAMIN D) 2000 units CAPS Take 2,000 Units by mouth daily.   Past Week at Unknown time  . ibuprofen (ADVIL,MOTRIN) 200 MG tablet Take 400 mg by mouth every 8 (eight) hours as needed.    Past Week at Unknown time  . levothyroxine (SYNTHROID, LEVOTHROID) 75 MCG tablet Take 75 mcg by mouth daily before breakfast.   10/22/2016 at Unknown time  . lisinopril  (PRINIVIL,ZESTRIL) 40 MG tablet Take 40 mg by mouth daily.   01/21/2017 at 0530  . meloxicam (MOBIC) 15 MG tablet Take 15 mg by mouth daily as needed (for pain.).   Past Month at Unknown time  . nitroGLYCERIN (NITROSTAT) 0.4 MG SL tablet Place 0.4 mg under the tongue every 5 (five) minutes x 3 doses as needed. For chest pain.   Past Week at Unknown time  . oxyCODONE-acetaminophen (PERCOCET/ROXICET) 5-325 MG per tablet Take 1-2 tablets by mouth every 6 (six) hours as needed for severe pain. (Patient not taking: Reported on 10/18/2016) 20 tablet 0 Not Taking at Unknown time  . pantoprazole (PROTONIX) 40 MG tablet Take 40 mg by mouth daily.   10/22/2016 at Unknown time  . phenazopyridine (PYRIDIUM) 100 MG tablet Take 1 tablet (100 mg total) by mouth 3 (three) times daily as needed for pain. (Patient not taking: Reported on 10/18/2016) 20 tablet 0 Not Taking at Unknown time  . ranitidine (ZANTAC) 300 MG tablet Take 300 mg by mouth at bedtime.   Past Week at Unknown time     No Known Allergies   Past Medical History:  Diagnosis Date  . Arthritis   . Asthma   . Chronic back pain   . Chronic back pain    scoliosis and 2 buldging disc  . Depression   . Diastolic dysfunction   . Diverticulosis   . Dysrhythmia    PALPITATIONS  . Fibromyalgia   . Gastritis   . GERD (gastroesophageal reflux disease)  takes Protonix daily  . Headache(784.0)    sinus related  . Hyperlipidemia    just diagnosed;will follow up in 6wks to discuss meds  . Hypertension    takes Lisinopril daily  . Hypothyroidism    takes Synthroid daily  . Ischemic colitis ([place])   . Joint pain   . Nocturia   . Obesity   . Vitamin D deficiency    just placed on Vit D this week    Review of systems:      Physical Exam    Heart and lungs: Regular rate and rhythm without rub or gallop, lungs are bilaterally clear    HEENT: Normocephalic atraumatic eyes are anicteric    Other:     Pertinant exam for procedure: Soft  nontender nondistended bowel sounds positive normoactive.    Planned proceedures: EGD and colonoscopy with indicated procedures. I have discussed the risks benefits and complications of procedures to include not limited to bleeding, infection, perforation and the risk of sedation and the patient wishes to proceed.    Lollie Sails, MD Gastroenterology 01/21/2017  10:47 AM

## 2017-01-21 NOTE — Op Note (Signed)
[facility] Gastroenterology Patient Name: Madison Jennings Procedure Date: 01/21/2017 10:46 AM MRN: [id number] Account #: [id number] Date of Birth: 15-Oct-1940 Admit Type: Outpatient Age: 76 Room: [facility] ENDO ROOM 3 Gender: Female Note Status: Finalized Procedure:            Upper GI endoscopy Indications:          Dyspepsia, Diarrhea Providers:            Lollie Sails, MD Referring MD:         Cletis Athens, MD (Referring MD) Medicines:            Monitored Anesthesia Care Complications:        No immediate complications. Procedure:            Pre-Anesthesia Assessment:                       - ASA Grade Assessment: III - A patient with severe                        systemic disease.                       After obtaining informed consent, the endoscope was                        passed under direct vision. Throughout the procedure,                        the patient's blood pressure, pulse, and oxygen                        saturations were monitored continuously. The Endoscope                        was introduced through the mouth, and advanced to the                        fourth part of duodenum. The upper GI endoscopy was                        accomplished without difficulty. The patient tolerated                        the procedure well. Findings:      The Z-line was variable.      LA Grade A (one or more mucosal breaks less than 5 mm, not extending       between tops of 2 mucosal folds) esophagitis with no bleeding was found.       Biopsies were taken with a cold forceps for histology.      The exam of the esophagus was otherwise normal.      Patchy mild inflammation characterized by congestion (edema), erosions       and erythema was found in the gastric antrum. Biopsies were taken with a       cold forceps for histology. Biopsies were taken with a cold forceps for       Helicobacter pylori testing.      One non-bleeding superficial gastric ulcer with  no stigmata of bleeding       was found at the incisura. The lesion was 2 mm in largest dimension.  Biopsies were taken with a cold forceps for histology.      The cardia and gastric fundus were normal on retroflexion otherwise.      The examined duodenum was normal. Biopsies were taken with a cold       forceps for histology. Impression:           - Z-line variable.                       - LA Grade A reflux esophagitis. Biopsied.                       - Erosive gastritis. Biopsied.                       - Non-bleeding gastric ulcer with no stigmata of                        bleeding. Biopsied.                       - Normal examined duodenum. Biopsied. Recommendation:       - Use Protonix (pantoprazole) 40 mg PO BID for 4 weeks.                       - Use Protonix (pantoprazole) 40 mg PO daily.                       - Return to GI clinic in 5 weeks. Procedure Code(s):    --- Professional ---                       [phone], Esophagogastroduodenoscopy, flexible, transoral;                        with biopsy, single or multiple Diagnosis Code(s):    --- Professional ---                       K22.8, Other specified diseases of esophagus                       K21.0, Gastro-esophageal reflux disease with esophagitis                       K29.60, Other gastritis without bleeding                       K25.9, Gastric ulcer, unspecified as acute or chronic,                        without hemorrhage or perforation                       R10.13, Epigastric pain                       R19.7, Diarrhea, unspecified CPT copyright 2016 American Medical Association. All rights reserved. The codes documented in this report are preliminary and upon coder review may  be revised to meet current compliance requirements. Lollie Sails, MD 01/21/2017 11:07:16 AM This report has been signed electronically. Number of Addenda: 0 Note Initiated On: 01/21/2017 10:46 AM      [facility]

## 2017-01-21 NOTE — Op Note (Signed)
[facility] Gastroenterology Patient Name: Madison Jennings Procedure Date: 01/21/2017 10:45 AM MRN: [id number] Account #: [id number] Date of Birth: 25-Apr-1940 Admit Type: Outpatient Age: 76 Room: [facility] ENDO ROOM 3 Gender: Female Note Status: Finalized Procedure:            Colonoscopy Indications:          Lower abdominal pain, Clinically significant diarrhea                        of unexplained origin Providers:            Lollie Sails, MD Referring MD:         Cletis Athens, MD (Referring MD) Medicines:            Monitored Anesthesia Care Complications:        No immediate complications. Procedure:            Pre-Anesthesia Assessment:                       - ASA Grade Assessment: III - A patient with severe                        systemic disease.                       After obtaining informed consent, the colonoscope was                        passed under direct vision. Throughout the procedure,                        the patient's blood pressure, pulse, and oxygen                        saturations were monitored continuously. The Olympus                        PCF-H180AL colonoscope ( S#: [id number] ) was introduced                        through the anus and advanced to the the cecum,                        identified by appendiceal orifice and ileocecal valve.                        The colonoscopy was unusually difficult due to                        significant looping and a tortuous colon. Successful                        completion of the procedure was aided by changing the                        patient to a prone position and applying abdominal                        pressure. Findings:      The colon (entire examined portion) was significantly redundant. I  advanced the scope to about the mid-ascending colon. At this point I was       unable to pass the scope further forward, and withdrawal of the scope       was difficult despite position  change and irrigation. The IC valve was       seen from a distance but the cecum was not intubated.      Biopsies for histology were taken with a cold forceps from the right       colon and left colon for evaluation of microscopic colitis.      Multiple medium-mouthed diverticula were found in the sigmoid colon and       descending colon. Impression:           - Redundant colon.                       - Diverticulosis in the sigmoid colon and in the                        descending colon.                       - Biopsies were taken with a cold forceps from the                        right colon and left colon for evaluation of                        microscopic colitis. Recommendation:       - Discharge patient to home.                       - Perform an air contrast barium enema at appointment                        to be scheduled.                       - Await pathology results.                       - Return to GI clinic in 3 weeks. Procedure Code(s):    --- Professional ---                       [phone], Colonoscopy, flexible; with biopsy, single or                        multiple Diagnosis Code(s):    --- Professional ---                       R10.30, Lower abdominal pain, unspecified                       R19.7, Diarrhea, unspecified                       K57.30, Diverticulosis of large intestine without                        perforation or abscess without bleeding  Q43.8, Other specified congenital malformations of                        intestine CPT copyright 2016 American Medical Association. All rights reserved. The codes documented in this report are preliminary and upon coder review may  be revised to meet current compliance requirements. Lollie Sails, MD 01/21/2017 12:01:21 PM This report has been signed electronically. Number of Addenda: 0 Note Initiated On: 01/21/2017 10:45 AM Scope Withdrawal Time: 0 hours 5 minutes 12 seconds  Total  Procedure Duration: 0 hours 34 minutes 17 seconds       [facility]

## 2017-01-22 ENCOUNTER — Encounter: Payer: Self-pay | Admitting: Gastroenterology

## 2017-01-23 LAB — SURGICAL PATHOLOGY

## 2017-01-27 DIAGNOSIS — M545 Low back pain: Secondary | ICD-10-CM | POA: Diagnosis not present

## 2017-01-27 DIAGNOSIS — G8928 Other chronic postprocedural pain: Secondary | ICD-10-CM | POA: Diagnosis not present

## 2017-01-27 DIAGNOSIS — G8929 Other chronic pain: Secondary | ICD-10-CM | POA: Diagnosis not present

## 2017-01-27 DIAGNOSIS — Z79899 Other long term (current) drug therapy: Secondary | ICD-10-CM | POA: Diagnosis not present

## 2017-01-27 DIAGNOSIS — M961 Postlaminectomy syndrome, not elsewhere classified: Secondary | ICD-10-CM | POA: Diagnosis not present

## 2017-01-27 DIAGNOSIS — G8921 Chronic pain due to trauma: Secondary | ICD-10-CM | POA: Diagnosis not present

## 2017-01-27 DIAGNOSIS — F1120 Opioid dependence, uncomplicated: Secondary | ICD-10-CM | POA: Diagnosis not present

## 2017-01-27 DIAGNOSIS — M5416 Radiculopathy, lumbar region: Secondary | ICD-10-CM | POA: Diagnosis not present

## 2017-01-27 DIAGNOSIS — G629 Polyneuropathy, unspecified: Secondary | ICD-10-CM | POA: Diagnosis not present

## 2017-02-26 ENCOUNTER — Other Ambulatory Visit
Admission: RE | Admit: 2017-02-26 | Discharge: 2017-02-26 | Disposition: A | Discharge status: Home / Self Care | Payer: Medicare HMO | Source: Ambulatory Visit | Attending: Internal Medicine | Admitting: Internal Medicine

## 2017-02-26 DIAGNOSIS — I119 Hypertensive heart disease without heart failure: Secondary | ICD-10-CM | POA: Diagnosis not present

## 2017-02-26 DIAGNOSIS — K219 Gastro-esophageal reflux disease without esophagitis: Secondary | ICD-10-CM | POA: Insufficient documentation

## 2017-02-26 DIAGNOSIS — E039 Hypothyroidism, unspecified: Secondary | ICD-10-CM | POA: Insufficient documentation

## 2017-02-26 DIAGNOSIS — R079 Chest pain, unspecified: Secondary | ICD-10-CM | POA: Diagnosis not present

## 2017-02-26 DIAGNOSIS — R140 Abdominal distension (gaseous): Secondary | ICD-10-CM | POA: Diagnosis not present

## 2017-02-26 DIAGNOSIS — I10 Essential (primary) hypertension: Secondary | ICD-10-CM | POA: Diagnosis not present

## 2017-02-26 LAB — BASIC METABOLIC PANEL
Anion gap: 6 (ref 5–15)
BUN: 18 mg/dL (ref 6–20)
CO2: 26 mmol/L (ref 22–32)
Calcium: 8.9 mg/dL (ref 8.9–10.3)
Chloride: 106 mmol/L (ref 101–111)
Creatinine, Ser: 1.03 mg/dL — ABNORMAL HIGH (ref 0.44–1.00)
GFR calc Af Amer: >60 mL/min (ref >60)
GFR calc non Af Amer: 52 mL/min — ABNORMAL LOW (ref >60)
Glucose, Bld: 105 mg/dL — ABNORMAL HIGH (ref 65–99)
Potassium: 4.4 mmol/L (ref 3.5–5.1)
Sodium: 138 mmol/L (ref 135–145)

## 2017-02-26 LAB — CBC
HCT: 39.9 % (ref 35.0–47.0)
Hemoglobin: 13.5 g/dL (ref 12.0–16.0)
MCH: 30.9 pg (ref 26.0–34.0)
MCHC: 33.8 g/dL (ref 32.0–36.0)
MCV: 91.5 fL (ref 80.0–100.0)
Platelets: 227 10*3/uL (ref 150–440)
RBC: 4.36 MIL/uL (ref 3.80–5.20)
RDW: 13.8 % (ref 11.5–14.5)
WBC: 6 10*3/uL (ref 3.6–11.0)

## 2017-02-26 LAB — TSH: TSH: 6.216 u[IU]/mL — ABNORMAL HIGH (ref 0.350–4.500)

## 2017-03-03 DIAGNOSIS — M5416 Radiculopathy, lumbar region: Secondary | ICD-10-CM | POA: Diagnosis not present

## 2017-03-03 DIAGNOSIS — F1120 Opioid dependence, uncomplicated: Secondary | ICD-10-CM | POA: Diagnosis not present

## 2017-03-03 DIAGNOSIS — G8928 Other chronic postprocedural pain: Secondary | ICD-10-CM | POA: Diagnosis not present

## 2017-03-03 DIAGNOSIS — M545 Low back pain: Secondary | ICD-10-CM | POA: Diagnosis not present

## 2017-03-03 DIAGNOSIS — Z79899 Other long term (current) drug therapy: Secondary | ICD-10-CM | POA: Diagnosis not present

## 2017-03-03 DIAGNOSIS — G8921 Chronic pain due to trauma: Secondary | ICD-10-CM | POA: Diagnosis not present

## 2017-03-03 DIAGNOSIS — G8929 Other chronic pain: Secondary | ICD-10-CM | POA: Diagnosis not present

## 2017-03-03 DIAGNOSIS — M961 Postlaminectomy syndrome, not elsewhere classified: Secondary | ICD-10-CM | POA: Diagnosis not present

## 2017-03-03 DIAGNOSIS — G629 Polyneuropathy, unspecified: Secondary | ICD-10-CM | POA: Diagnosis not present

## 2017-03-04 ENCOUNTER — Other Ambulatory Visit: Payer: Self-pay | Admitting: Internal Medicine

## 2017-03-04 DIAGNOSIS — R102 Pelvic and perineal pain: Secondary | ICD-10-CM

## 2017-03-04 DIAGNOSIS — R109 Unspecified abdominal pain: Secondary | ICD-10-CM

## 2017-03-13 ENCOUNTER — Ambulatory Visit
Admission: RE | Admit: 2017-03-13 | Discharge: 2017-03-13 | Disposition: A | Discharge status: Home / Self Care | Payer: Medicare HMO | Source: Ambulatory Visit | Attending: Internal Medicine | Admitting: Internal Medicine

## 2017-03-19 DIAGNOSIS — E890 Postprocedural hypothyroidism: Secondary | ICD-10-CM | POA: Diagnosis not present

## 2017-03-21 ENCOUNTER — Ambulatory Visit
Admission: RE | Admit: 2017-03-21 | Discharge: 2017-03-21 | Disposition: A | Discharge status: Home / Self Care | Payer: Medicare HMO | Source: Ambulatory Visit | Attending: Internal Medicine | Admitting: Internal Medicine

## 2017-03-21 DIAGNOSIS — N261 Atrophy of kidney (terminal): Secondary | ICD-10-CM | POA: Insufficient documentation

## 2017-03-21 DIAGNOSIS — R109 Unspecified abdominal pain: Secondary | ICD-10-CM | POA: Insufficient documentation

## 2017-03-21 DIAGNOSIS — R102 Pelvic and perineal pain: Secondary | ICD-10-CM | POA: Diagnosis not present

## 2017-03-21 DIAGNOSIS — R1032 Left lower quadrant pain: Secondary | ICD-10-CM | POA: Diagnosis not present

## 2017-03-21 MED ORDER — IOPAMIDOL (ISOVUE-300) INJECTION 61%
100.0000 mL | Freq: Once | INTRAVENOUS | Status: AC | PRN
  Administered 2017-03-21: 100 mL via INTRAVENOUS

## 2017-03-26 DIAGNOSIS — E890 Postprocedural hypothyroidism: Secondary | ICD-10-CM | POA: Diagnosis not present

## 2017-03-27 DIAGNOSIS — R079 Chest pain, unspecified: Secondary | ICD-10-CM | POA: Diagnosis not present

## 2017-03-27 DIAGNOSIS — K219 Gastro-esophageal reflux disease without esophagitis: Secondary | ICD-10-CM | POA: Diagnosis not present

## 2017-03-27 DIAGNOSIS — I119 Hypertensive heart disease without heart failure: Secondary | ICD-10-CM | POA: Diagnosis not present

## 2017-03-27 DIAGNOSIS — I10 Essential (primary) hypertension: Secondary | ICD-10-CM | POA: Diagnosis not present

## 2017-03-31 DIAGNOSIS — G8921 Chronic pain due to trauma: Secondary | ICD-10-CM | POA: Diagnosis not present

## 2017-03-31 DIAGNOSIS — G629 Polyneuropathy, unspecified: Secondary | ICD-10-CM | POA: Diagnosis not present

## 2017-03-31 DIAGNOSIS — F1120 Opioid dependence, uncomplicated: Secondary | ICD-10-CM | POA: Diagnosis not present

## 2017-03-31 DIAGNOSIS — M5416 Radiculopathy, lumbar region: Secondary | ICD-10-CM | POA: Diagnosis not present

## 2017-03-31 DIAGNOSIS — G8929 Other chronic pain: Secondary | ICD-10-CM | POA: Diagnosis not present

## 2017-03-31 DIAGNOSIS — M961 Postlaminectomy syndrome, not elsewhere classified: Secondary | ICD-10-CM | POA: Diagnosis not present

## 2017-03-31 DIAGNOSIS — G8928 Other chronic postprocedural pain: Secondary | ICD-10-CM | POA: Diagnosis not present

## 2017-03-31 DIAGNOSIS — M545 Low back pain: Secondary | ICD-10-CM | POA: Diagnosis not present

## 2017-03-31 DIAGNOSIS — Z79899 Other long term (current) drug therapy: Secondary | ICD-10-CM | POA: Diagnosis not present

## 2017-04-01 DIAGNOSIS — H524 Presbyopia: Secondary | ICD-10-CM | POA: Diagnosis not present

## 2017-04-01 DIAGNOSIS — H43812 Vitreous degeneration, left eye: Secondary | ICD-10-CM | POA: Diagnosis not present

## 2017-04-01 DIAGNOSIS — H25813 Combined forms of age-related cataract, bilateral: Secondary | ICD-10-CM | POA: Diagnosis not present

## 2017-04-01 DIAGNOSIS — H35033 Hypertensive retinopathy, bilateral: Secondary | ICD-10-CM | POA: Diagnosis not present

## 2017-04-15 DIAGNOSIS — Z8719 Personal history of other diseases of the digestive system: Secondary | ICD-10-CM | POA: Diagnosis not present

## 2017-04-15 DIAGNOSIS — K580 Irritable bowel syndrome with diarrhea: Secondary | ICD-10-CM | POA: Diagnosis not present

## 2017-04-15 DIAGNOSIS — K210 Gastro-esophageal reflux disease with esophagitis: Secondary | ICD-10-CM | POA: Diagnosis not present

## 2017-04-21 DIAGNOSIS — F1120 Opioid dependence, uncomplicated: Secondary | ICD-10-CM | POA: Diagnosis not present

## 2017-04-21 DIAGNOSIS — M961 Postlaminectomy syndrome, not elsewhere classified: Secondary | ICD-10-CM | POA: Diagnosis not present

## 2017-04-21 DIAGNOSIS — Z79899 Other long term (current) drug therapy: Secondary | ICD-10-CM | POA: Diagnosis not present

## 2017-04-21 DIAGNOSIS — G8928 Other chronic postprocedural pain: Secondary | ICD-10-CM | POA: Diagnosis not present

## 2017-04-21 DIAGNOSIS — G8929 Other chronic pain: Secondary | ICD-10-CM | POA: Diagnosis not present

## 2017-04-21 DIAGNOSIS — M545 Low back pain: Secondary | ICD-10-CM | POA: Diagnosis not present

## 2017-04-21 DIAGNOSIS — M5416 Radiculopathy, lumbar region: Secondary | ICD-10-CM | POA: Diagnosis not present

## 2017-04-21 DIAGNOSIS — G8921 Chronic pain due to trauma: Secondary | ICD-10-CM | POA: Diagnosis not present

## 2017-04-21 DIAGNOSIS — G629 Polyneuropathy, unspecified: Secondary | ICD-10-CM | POA: Diagnosis not present

## 2017-06-16 DIAGNOSIS — M961 Postlaminectomy syndrome, not elsewhere classified: Secondary | ICD-10-CM | POA: Diagnosis not present

## 2017-06-16 DIAGNOSIS — G8929 Other chronic pain: Secondary | ICD-10-CM | POA: Diagnosis not present

## 2017-06-16 DIAGNOSIS — F1120 Opioid dependence, uncomplicated: Secondary | ICD-10-CM | POA: Diagnosis not present

## 2017-06-16 DIAGNOSIS — G8921 Chronic pain due to trauma: Secondary | ICD-10-CM | POA: Diagnosis not present

## 2017-06-16 DIAGNOSIS — Z79899 Other long term (current) drug therapy: Secondary | ICD-10-CM | POA: Diagnosis not present

## 2017-06-16 DIAGNOSIS — M545 Low back pain: Secondary | ICD-10-CM | POA: Diagnosis not present

## 2017-06-30 ENCOUNTER — Other Ambulatory Visit: Payer: Self-pay | Admitting: Internal Medicine

## 2017-06-30 DIAGNOSIS — Z1231 Encounter for screening mammogram for malignant neoplasm of breast: Secondary | ICD-10-CM

## 2017-07-28 ENCOUNTER — Ambulatory Visit
Admission: RE | Admit: 2017-07-28 | Discharge: 2017-07-28 | Disposition: A | Discharge status: Home / Self Care | Payer: Medicare HMO | Source: Ambulatory Visit | Attending: Internal Medicine | Admitting: Internal Medicine

## 2017-07-28 DIAGNOSIS — Z1231 Encounter for screening mammogram for malignant neoplasm of breast: Secondary | ICD-10-CM | POA: Diagnosis not present

## 2017-08-11 DIAGNOSIS — Z79899 Other long term (current) drug therapy: Secondary | ICD-10-CM | POA: Diagnosis not present

## 2017-08-11 DIAGNOSIS — G8921 Chronic pain due to trauma: Secondary | ICD-10-CM | POA: Diagnosis not present

## 2017-08-11 DIAGNOSIS — M545 Low back pain: Secondary | ICD-10-CM | POA: Diagnosis not present

## 2017-08-11 DIAGNOSIS — F1120 Opioid dependence, uncomplicated: Secondary | ICD-10-CM | POA: Diagnosis not present

## 2017-08-11 DIAGNOSIS — M961 Postlaminectomy syndrome, not elsewhere classified: Secondary | ICD-10-CM | POA: Diagnosis not present

## 2017-08-11 DIAGNOSIS — G8929 Other chronic pain: Secondary | ICD-10-CM | POA: Diagnosis not present

## 2017-09-15 DIAGNOSIS — R079 Chest pain, unspecified: Secondary | ICD-10-CM | POA: Diagnosis not present

## 2017-09-15 DIAGNOSIS — I119 Hypertensive heart disease without heart failure: Secondary | ICD-10-CM | POA: Diagnosis not present

## 2017-09-15 DIAGNOSIS — J309 Allergic rhinitis, unspecified: Secondary | ICD-10-CM | POA: Diagnosis not present

## 2017-09-15 DIAGNOSIS — K219 Gastro-esophageal reflux disease without esophagitis: Secondary | ICD-10-CM | POA: Diagnosis not present

## 2017-09-23 DIAGNOSIS — K219 Gastro-esophageal reflux disease without esophagitis: Secondary | ICD-10-CM | POA: Diagnosis not present

## 2017-09-23 DIAGNOSIS — K582 Mixed irritable bowel syndrome: Secondary | ICD-10-CM | POA: Diagnosis not present

## 2017-10-02 DIAGNOSIS — R531 Weakness: Secondary | ICD-10-CM | POA: Diagnosis not present

## 2017-10-02 DIAGNOSIS — I208 Other forms of angina pectoris: Secondary | ICD-10-CM | POA: Diagnosis not present

## 2017-10-02 DIAGNOSIS — K219 Gastro-esophageal reflux disease without esophagitis: Secondary | ICD-10-CM | POA: Diagnosis not present

## 2017-10-02 DIAGNOSIS — R001 Bradycardia, unspecified: Secondary | ICD-10-CM | POA: Diagnosis not present

## 2017-10-02 DIAGNOSIS — E079 Disorder of thyroid, unspecified: Secondary | ICD-10-CM | POA: Diagnosis not present

## 2017-10-02 DIAGNOSIS — R002 Palpitations: Secondary | ICD-10-CM | POA: Diagnosis not present

## 2017-10-02 DIAGNOSIS — R0602 Shortness of breath: Secondary | ICD-10-CM | POA: Diagnosis not present

## 2017-10-02 DIAGNOSIS — E669 Obesity, unspecified: Secondary | ICD-10-CM | POA: Diagnosis not present

## 2017-10-02 DIAGNOSIS — E890 Postprocedural hypothyroidism: Secondary | ICD-10-CM | POA: Diagnosis not present

## 2017-10-02 DIAGNOSIS — I480 Paroxysmal atrial fibrillation: Secondary | ICD-10-CM | POA: Diagnosis not present

## 2017-10-06 DIAGNOSIS — F1120 Opioid dependence, uncomplicated: Secondary | ICD-10-CM | POA: Diagnosis not present

## 2017-10-06 DIAGNOSIS — G8929 Other chronic pain: Secondary | ICD-10-CM | POA: Diagnosis not present

## 2017-10-06 DIAGNOSIS — M545 Low back pain: Secondary | ICD-10-CM | POA: Diagnosis not present

## 2017-10-06 DIAGNOSIS — G8921 Chronic pain due to trauma: Secondary | ICD-10-CM | POA: Diagnosis not present

## 2017-10-06 DIAGNOSIS — Z79899 Other long term (current) drug therapy: Secondary | ICD-10-CM | POA: Diagnosis not present

## 2017-10-06 DIAGNOSIS — M961 Postlaminectomy syndrome, not elsewhere classified: Secondary | ICD-10-CM | POA: Diagnosis not present

## 2017-10-10 DIAGNOSIS — R002 Palpitations: Secondary | ICD-10-CM | POA: Diagnosis not present

## 2017-10-20 DIAGNOSIS — R0602 Shortness of breath: Secondary | ICD-10-CM | POA: Diagnosis not present

## 2017-10-20 DIAGNOSIS — I208 Other forms of angina pectoris: Secondary | ICD-10-CM | POA: Diagnosis not present

## 2017-10-23 ENCOUNTER — Other Ambulatory Visit: Payer: Self-pay | Admitting: Internal Medicine

## 2017-10-23 DIAGNOSIS — R0602 Shortness of breath: Secondary | ICD-10-CM

## 2017-10-23 DIAGNOSIS — I208 Other forms of angina pectoris: Secondary | ICD-10-CM

## 2017-10-31 ENCOUNTER — Ambulatory Visit
Admission: RE | Admit: 2017-10-31 | Discharge: 2017-10-31 | Disposition: A | Discharge status: Home / Self Care | Payer: Medicare HMO | Source: Ambulatory Visit | Attending: Internal Medicine | Admitting: Internal Medicine

## 2017-10-31 DIAGNOSIS — R079 Chest pain, unspecified: Secondary | ICD-10-CM | POA: Diagnosis not present

## 2017-10-31 DIAGNOSIS — R0602 Shortness of breath: Secondary | ICD-10-CM | POA: Diagnosis not present

## 2017-10-31 DIAGNOSIS — I208 Other forms of angina pectoris: Secondary | ICD-10-CM | POA: Diagnosis not present

## 2017-10-31 LAB — NM MYOCAR MULTI W/SPECT W/WALL MOTION / EF
CHL CUP NUCLEAR SDS: 0
CHL CUP RESTING HR STRESS: 45 {beats}/min
CSEPHR: 97 %
CSEPPHR: 141 {beats}/min
Estimated workload: 6.4 METS
Exercise duration (min): 4 min
Exercise duration (sec): 34 s
LV sys vol: 22 mL
LVDIAVOL: 76 mL (ref 46–106)
MPHR: 145 {beats}/min
SRS: 2
SSS: 1
TID: 0.96

## 2017-10-31 MED ORDER — TECHNETIUM TC 99M TETROFOSMIN IV KIT
14.2300 | PACK | Freq: Once | INTRAVENOUS | Status: AC | PRN
  Administered 2017-10-31: 14.23 via INTRAVENOUS

## 2017-10-31 MED ORDER — TECHNETIUM TC 99M TETROFOSMIN IV KIT
30.0000 | PACK | Freq: Once | INTRAVENOUS | Status: AC | PRN
  Administered 2017-10-31: 32.88 via INTRAVENOUS

## 2017-11-06 DIAGNOSIS — I480 Paroxysmal atrial fibrillation: Secondary | ICD-10-CM | POA: Diagnosis not present

## 2017-11-06 DIAGNOSIS — R001 Bradycardia, unspecified: Secondary | ICD-10-CM | POA: Diagnosis not present

## 2017-11-06 DIAGNOSIS — E669 Obesity, unspecified: Secondary | ICD-10-CM | POA: Diagnosis not present

## 2017-11-06 DIAGNOSIS — R002 Palpitations: Secondary | ICD-10-CM | POA: Diagnosis not present

## 2017-11-06 DIAGNOSIS — E079 Disorder of thyroid, unspecified: Secondary | ICD-10-CM | POA: Diagnosis not present

## 2017-11-06 DIAGNOSIS — K219 Gastro-esophageal reflux disease without esophagitis: Secondary | ICD-10-CM | POA: Diagnosis not present

## 2017-11-06 DIAGNOSIS — R0602 Shortness of breath: Secondary | ICD-10-CM | POA: Diagnosis not present

## 2017-11-06 DIAGNOSIS — I208 Other forms of angina pectoris: Secondary | ICD-10-CM | POA: Diagnosis not present

## 2017-11-06 DIAGNOSIS — R531 Weakness: Secondary | ICD-10-CM | POA: Diagnosis not present

## 2017-11-13 DIAGNOSIS — E668 Other obesity: Secondary | ICD-10-CM | POA: Diagnosis not present

## 2017-11-13 DIAGNOSIS — R350 Frequency of micturition: Secondary | ICD-10-CM | POA: Diagnosis not present

## 2017-11-13 DIAGNOSIS — I119 Hypertensive heart disease without heart failure: Secondary | ICD-10-CM | POA: Diagnosis not present

## 2017-11-13 DIAGNOSIS — R079 Chest pain, unspecified: Secondary | ICD-10-CM | POA: Diagnosis not present

## 2017-11-13 DIAGNOSIS — I10 Essential (primary) hypertension: Secondary | ICD-10-CM | POA: Diagnosis not present

## 2017-11-21 DIAGNOSIS — R309 Painful micturition, unspecified: Secondary | ICD-10-CM | POA: Diagnosis not present

## 2017-11-21 DIAGNOSIS — I10 Essential (primary) hypertension: Secondary | ICD-10-CM | POA: Diagnosis not present

## 2017-11-21 DIAGNOSIS — R079 Chest pain, unspecified: Secondary | ICD-10-CM | POA: Diagnosis not present

## 2017-11-21 DIAGNOSIS — N3000 Acute cystitis without hematuria: Secondary | ICD-10-CM | POA: Diagnosis not present

## 2017-11-27 DIAGNOSIS — R350 Frequency of micturition: Secondary | ICD-10-CM | POA: Diagnosis not present

## 2017-11-27 DIAGNOSIS — K219 Gastro-esophageal reflux disease without esophagitis: Secondary | ICD-10-CM | POA: Diagnosis not present

## 2017-11-27 DIAGNOSIS — E668 Other obesity: Secondary | ICD-10-CM | POA: Diagnosis not present

## 2017-11-27 DIAGNOSIS — I119 Hypertensive heart disease without heart failure: Secondary | ICD-10-CM | POA: Diagnosis not present

## 2017-12-01 DIAGNOSIS — M545 Low back pain: Secondary | ICD-10-CM | POA: Diagnosis not present

## 2017-12-01 DIAGNOSIS — Z79899 Other long term (current) drug therapy: Secondary | ICD-10-CM | POA: Diagnosis not present

## 2017-12-01 DIAGNOSIS — F1120 Opioid dependence, uncomplicated: Secondary | ICD-10-CM | POA: Diagnosis not present

## 2017-12-01 DIAGNOSIS — M961 Postlaminectomy syndrome, not elsewhere classified: Secondary | ICD-10-CM | POA: Diagnosis not present

## 2017-12-01 DIAGNOSIS — G8921 Chronic pain due to trauma: Secondary | ICD-10-CM | POA: Diagnosis not present

## 2017-12-01 DIAGNOSIS — G8929 Other chronic pain: Secondary | ICD-10-CM | POA: Diagnosis not present

## 2017-12-09 DIAGNOSIS — K219 Gastro-esophageal reflux disease without esophagitis: Secondary | ICD-10-CM | POA: Diagnosis not present

## 2017-12-09 DIAGNOSIS — R350 Frequency of micturition: Secondary | ICD-10-CM | POA: Diagnosis not present

## 2017-12-09 DIAGNOSIS — I119 Hypertensive heart disease without heart failure: Secondary | ICD-10-CM | POA: Diagnosis not present

## 2017-12-30 DIAGNOSIS — G8929 Other chronic pain: Secondary | ICD-10-CM | POA: Diagnosis not present

## 2017-12-30 DIAGNOSIS — M545 Low back pain: Secondary | ICD-10-CM | POA: Diagnosis not present

## 2017-12-30 DIAGNOSIS — I119 Hypertensive heart disease without heart failure: Secondary | ICD-10-CM | POA: Diagnosis not present

## 2018-01-12 DIAGNOSIS — R5381 Other malaise: Secondary | ICD-10-CM | POA: Diagnosis not present

## 2018-01-12 DIAGNOSIS — E7849 Other hyperlipidemia: Secondary | ICD-10-CM | POA: Diagnosis not present

## 2018-01-12 DIAGNOSIS — E034 Atrophy of thyroid (acquired): Secondary | ICD-10-CM | POA: Diagnosis not present

## 2018-01-12 DIAGNOSIS — Z1159 Encounter for screening for other viral diseases: Secondary | ICD-10-CM | POA: Diagnosis not present

## 2018-01-12 DIAGNOSIS — I10 Essential (primary) hypertension: Secondary | ICD-10-CM | POA: Diagnosis not present

## 2018-01-12 DIAGNOSIS — E119 Type 2 diabetes mellitus without complications: Secondary | ICD-10-CM | POA: Diagnosis not present

## 2018-01-19 DIAGNOSIS — K219 Gastro-esophageal reflux disease without esophagitis: Secondary | ICD-10-CM | POA: Diagnosis not present

## 2018-01-19 DIAGNOSIS — J309 Allergic rhinitis, unspecified: Secondary | ICD-10-CM | POA: Diagnosis not present

## 2018-01-19 DIAGNOSIS — M961 Postlaminectomy syndrome, not elsewhere classified: Secondary | ICD-10-CM | POA: Diagnosis not present

## 2018-01-19 DIAGNOSIS — E6609 Other obesity due to excess calories: Secondary | ICD-10-CM | POA: Diagnosis not present

## 2018-01-26 DIAGNOSIS — M961 Postlaminectomy syndrome, not elsewhere classified: Secondary | ICD-10-CM | POA: Diagnosis not present

## 2018-01-26 DIAGNOSIS — M545 Low back pain: Secondary | ICD-10-CM | POA: Diagnosis not present

## 2018-01-26 DIAGNOSIS — G8921 Chronic pain due to trauma: Secondary | ICD-10-CM | POA: Diagnosis not present

## 2018-01-26 DIAGNOSIS — G8929 Other chronic pain: Secondary | ICD-10-CM | POA: Diagnosis not present

## 2018-01-26 DIAGNOSIS — Z79899 Other long term (current) drug therapy: Secondary | ICD-10-CM | POA: Diagnosis not present

## 2018-01-26 DIAGNOSIS — F1120 Opioid dependence, uncomplicated: Secondary | ICD-10-CM | POA: Diagnosis not present

## 2018-03-11 DIAGNOSIS — K219 Gastro-esophageal reflux disease without esophagitis: Secondary | ICD-10-CM | POA: Diagnosis not present

## 2018-03-11 DIAGNOSIS — R0789 Other chest pain: Secondary | ICD-10-CM | POA: Diagnosis not present

## 2018-03-11 DIAGNOSIS — I119 Hypertensive heart disease without heart failure: Secondary | ICD-10-CM | POA: Diagnosis not present

## 2018-03-13 ENCOUNTER — Emergency Department
Admission: EM | Admit: 2018-03-13 | Discharge: 2018-03-13 | Disposition: A | Discharge status: Home / Self Care | Payer: Medicare Other | Attending: Emergency Medicine | Admitting: Emergency Medicine

## 2018-03-13 ENCOUNTER — Other Ambulatory Visit: Payer: Self-pay

## 2018-03-13 DIAGNOSIS — M79605 Pain in left leg: Secondary | ICD-10-CM | POA: Diagnosis present

## 2018-03-13 DIAGNOSIS — Z87891 Personal history of nicotine dependence: Secondary | ICD-10-CM | POA: Diagnosis not present

## 2018-03-13 DIAGNOSIS — E039 Hypothyroidism, unspecified: Secondary | ICD-10-CM | POA: Diagnosis not present

## 2018-03-13 DIAGNOSIS — J45909 Unspecified asthma, uncomplicated: Secondary | ICD-10-CM | POA: Insufficient documentation

## 2018-03-13 DIAGNOSIS — I10 Essential (primary) hypertension: Secondary | ICD-10-CM | POA: Diagnosis not present

## 2018-03-13 DIAGNOSIS — M5432 Sciatica, left side: Secondary | ICD-10-CM

## 2018-03-13 DIAGNOSIS — M5410 Radiculopathy, site unspecified: Secondary | ICD-10-CM | POA: Diagnosis not present

## 2018-03-13 DIAGNOSIS — Z955 Presence of coronary angioplasty implant and graft: Secondary | ICD-10-CM | POA: Diagnosis not present

## 2018-03-13 DIAGNOSIS — Z79899 Other long term (current) drug therapy: Secondary | ICD-10-CM | POA: Diagnosis not present

## 2018-03-13 MED ORDER — HYDROMORPHONE HCL 1 MG/ML IJ SOLN
1.0000 mg | Freq: Once | INTRAMUSCULAR | Status: AC
  Administered 2018-03-13: 1 mg via INTRAMUSCULAR
  Filled 2018-03-13: qty 1

## 2018-03-13 MED ORDER — CYCLOBENZAPRINE HCL 5 MG PO TABS: 5.0000 mg | ORAL_TABLET | Freq: Three times a day (TID) | ORAL | 0 refills | Status: DC | PRN

## 2018-03-13 MED ORDER — OXYCODONE HCL 5 MG PO TABS: 5.0000 mg | ORAL_TABLET | Freq: Three times a day (TID) | ORAL | 0 refills | Status: DC | PRN

## 2018-03-13 MED ORDER — PREDNISONE 20 MG PO TABS: 20.0000 mg | ORAL_TABLET | Freq: Two times a day (BID) | ORAL | 0 refills | Status: AC

## 2018-03-13 MED ORDER — KETOROLAC TROMETHAMINE 30 MG/ML IJ SOLN
30.0000 mg | Freq: Once | INTRAMUSCULAR | Status: AC
  Administered 2018-03-13: 30 mg via INTRAMUSCULAR
  Filled 2018-03-13: qty 1

## 2018-03-13 MED ORDER — OXYCODONE HCL 5 MG PO TABS: 5.0000 mg | ORAL_TABLET | Freq: Three times a day (TID) | ORAL | 0 refills | Status: AC | PRN

## 2018-03-13 MED ORDER — ORPHENADRINE CITRATE 30 MG/ML IJ SOLN
60.0000 mg | INTRAMUSCULAR | Status: AC
Start: 2018-03-13 — End: 2018-03-13
  Administered 2018-03-13: 60 mg via INTRAMUSCULAR
  Filled 2018-03-13: qty 2

## 2018-03-13 MED ORDER — PREDNISONE 20 MG PO TABS
60.0000 mg | ORAL_TABLET | Freq: Once | ORAL | Status: AC
  Administered 2018-03-13: 60 mg via ORAL
  Filled 2018-03-13: qty 3

## 2018-03-13 NOTE — ED Provider Notes (Addendum)
[facility] Emergency Department Provider Note ____________________________________________  Time seen: 1057  I have reviewed the triage vital signs and the nursing notes.  HISTORY  Chief Complaint  Leg Pain  HPI Madison Jennings is a 78 y.o. female with a history of chronic LBP, HNP, radiculopathy, presents with LLE paresthesias. She has an implanted nerve stimulator. She notes limited benefit of this flare with her daily Neurontin and oxycodone. She denies any recent injury, trauma, or falls. She is without incontinence, foot drop, or weakness. She notes pain to the leg from the hip to the shin.  She reports her pain at a 8 out of 10 at this time.  Past Medical History:  Diagnosis Date  . Arthritis   . Asthma   . Chronic back pain   . Chronic back pain    scoliosis and 2 buldging disc  . Depression   . Diastolic dysfunction   . Diverticulosis   . Dysrhythmia    PALPITATIONS  . Fibromyalgia   . Gastritis   . GERD (gastroesophageal reflux disease)    takes Protonix daily  . Headache(784.0)    sinus related  . Hyperlipidemia    just diagnosed;will follow up in 6wks to discuss meds  . Hypertension    takes Lisinopril daily  . Hypothyroidism    takes Synthroid daily  . Ischemic colitis ([place])   . Joint pain   . Nocturia   . Obesity   . Vitamin D deficiency    just placed on Vit D this week    Patient Active Problem List   Diagnosis Date Noted  . Pain in joint, shoulder region 11/22/2013  . Shoulder weakness 11/22/2013  . Diarrhea 05/02/2011  . Generalized weakness 05/02/2011  . Dehydration 05/02/2011  . HTN (hypertension) 05/02/2011  . Hypothyroidism 05/02/2011  . GERD (gastroesophageal reflux disease) 05/02/2011    Past Surgical History:  Procedure Laterality Date  . ABDOMINAL HYSTERECTOMY    . APPENDECTOMY    . BACK SURGERY     x 3  . CARDIAC CATHETERIZATION  04/13/2010   normal cororanies, EF 55% ([facility]; Dr. Clayborn Bigness)  .  CHOLECYSTECTOMY    . COLONOSCOPY    . COLONOSCOPY WITH PROPOFOL N/A 01/21/2017   Procedure: COLONOSCOPY WITH PROPOFOL;  Surgeon: Lollie Sails, MD;  Location: [facility] ENDOSCOPY;  Service: Endoscopy;  Laterality: N/A;  . CORONARY ANGIOPLASTY    . ESOPHAGOGASTRODUODENOSCOPY  06/12/2011  . ESOPHAGOGASTRODUODENOSCOPY (EGD) WITH PROPOFOL N/A 01/21/2017   Procedure: ESOPHAGOGASTRODUODENOSCOPY (EGD) WITH PROPOFOL;  Surgeon: Lollie Sails, MD;  Location: [facility] ENDOSCOPY;  Service: Endoscopy;  Laterality: N/A;  . FOOT SURGERY Left    d/t neuroma  . OOPHORECTOMY    . radiation to thyroid    . RIGHT/LEFT HEART CATH AND CORONARY ANGIOGRAPHY Bilateral 10/22/2016   Procedure: RIGHT/LEFT HEART CATH AND CORONARY ANGIOGRAPHY;  Surgeon: Yolonda Kida, MD;  Location: [facility];  Service: Cardiovascular;  Laterality: Bilateral;  . SPINAL CORD STIMULATOR INSERTION N/A 07/16/2013   Procedure: LUMBAR SPINAL CORD STIMULATOR INSERTION;  Surgeon: Bonna Gains, MD;  Location: [facility];  Service: Neurosurgery;  Laterality: N/A;    Prior to Admission medications   Medication Sig Start Date End Date Taking? Authorizing Provider  Cholecalciferol (VITAMIN D) 2000 units CAPS Take 2,000 Units by mouth daily.    [provider]  cyclobenzaprine (FLEXERIL) 5 MG tablet Take 1 tablet (5 mg total) by mouth 3 (three) times daily as needed for muscle spasms. 03/13/18  Particia Strahm, Dannielle Karvonen, PA-C  levothyroxine (SYNTHROID, LEVOTHROID) 75 MCG tablet Take 75 mcg by mouth daily before breakfast.    [provider]  lisinopril (PRINIVIL,ZESTRIL) 40 MG tablet Take 40 mg by mouth daily.    [provider]  meloxicam (MOBIC) 15 MG tablet Take 15 mg by mouth daily as needed (for pain.).    [provider]  oxyCODONE (OXY IR/ROXICODONE) 5 MG immediate release tablet Take 1 tablet (5 mg total) by mouth 3 (three) times daily as needed for up to 3 days for severe pain. 03/13/18 03/16/18   Keyasha Miah, Dannielle Karvonen, PA-C  oxycodone (OXY-IR) 5 MG capsule Take 5 mg by mouth every 4 (four) hours as needed.    [provider]  pantoprazole (PROTONIX) 40 MG tablet Take 40 mg by mouth daily.    [provider]  predniSONE (DELTASONE) 20 MG tablet Take 1 tablet (20 mg total) by mouth 2 (two) times daily with a meal for 5 days. 03/13/18 03/18/18  Arayna Illescas, Dannielle Karvonen, PA-C    Allergies Patient has no known allergies.  Family History  Problem Relation Age of Onset  . Breast cancer Neg Hx     Social History Social History   Tobacco Use  . Smoking status: Former Smoker    Packs/day: 0.50    Years: 0.00    Pack years: 0.00    Types: Cigarettes    Last attempt to quit: 06/20/1986    Years since quitting: 31.7  . Smokeless tobacco: Never Used  Substance Use Topics  . Alcohol use: No  . Drug use: No    Review of Systems  Constitutional: Negative for fever. Cardiovascular: Negative for chest pain. Respiratory: Negative for shortness of breath. Gastrointestinal: Negative for abdominal pain, vomiting and diarrhea. Genitourinary: Negative for dysuria. Musculoskeletal: Negative for back pain. Reports LLE radiculopathy. Skin: Negative for rash. Neurological: Negative for headaches, focal weakness or numbness. LLE pain as above ____________________________________________  PHYSICAL EXAM:  VITAL SIGNS: ED Triage Vitals  Enc Vitals Group     BP 03/13/18 1050 (!) 119/52     Pulse Rate 03/13/18 1050 (!) 50     Resp 03/13/18 1050 17     Temp 03/13/18 1050 98 F (36.7 C)     Temp Source 03/13/18 1050 Oral     SpO2 03/13/18 1050 94 %     Weight 03/13/18 1051 211 lb (95.7 kg)     Height 03/13/18 1051 5\' 4"  (1.626 m)     Head Circumference --      Peak Flow --      Pain Score 03/13/18 1055 9     Pain Loc --      Pain Edu? --      Excl. in [place]? --     Constitutional: Alert and oriented. Well appearing and in no distress. Head: Normocephalic and  atraumatic. Eyes: Conjunctivae are normal. Normal extraocular movements Cardiovascular: Normal rate, regular rhythm. Normal distal pulses. Respiratory: Normal respiratory effort. No wheezes/rales/rhonchi. Gastrointestinal: Soft and nontender. No distention.  Musculoskeletal: LLE with normal ROM. Nontender with normal range of motion in all extremities.  Neurologic:  Normal gait without ataxia. Negative supine SLR on the Left. Normal speech and language. No gross focal neurologic deficits are appreciated. Skin:  Skin is warm, dry and intact. No rash, bruise, or ecchymosis noted. ____________________________________________   RADIOLOGY Not indicated  ____________________________________________  PROCEDURES  Procedures Toradol 30 mg IM Norflex 60 mg IM Prednisone 60 mg PO ____________________________________________  INITIAL IMPRESSION / ASSESSMENT AND PLAN / ED COURSE  Presents to the ED for evaluation of a flare of her radicular pain on the left side.  Patient with a history of DDD, H&P, sciatica, and lumbar radiculopathy presents with a flare of her pain.  She has been treated with IM medication administration including Toradol, Norflex, Dilaudid as well as prednisone orally.  She reports pain is improved to 4 out of 10 at the time of this disposition.  She is encouraged to follow-up with primary provider or pain specialist as scheduled.  She is advised she may increase her Neurontin to 2 tabs during the midday dose for symptom relief.  Prescriptions for cyclobenzaprine and prednisone are sent to her pharmacy.  I had intended to send a prescription for #9 Roxicodone, but I failed to provide the patient with the signed, handwritten prescription.  He will follow-up with providers as scheduled.  Return precautions have been reviewed.  Patient and her husband verbalized understanding, and were thankful for the care given today.  I reviewed the patient's prescription history over the last 12  months in the multi-state controlled substances database(s) that includes [place], [place], [place], [place], [place], [place], [place], [place], [place], [place], [place], [place], [place], and [place].  Results were notable for monthly narcotic prescriptions. ____________________________________________  FINAL CLINICAL IMPRESSION(S) / ED DIAGNOSES  Final diagnoses:  Radicular syndrome of left leg  Sciatica of left side     Carmie End, Dannielle Karvonen, PA-C 03/13/18 1715    [address] Dannielle Karvonen, PA-C 03/13/18 1718    Orbie Pyo, MD 03/14/18 1352

## 2018-03-13 NOTE — ED Notes (Signed)
Pt is being discharged to home with husband. Pt is AO)x4, VSS, she does not show any signs of distress and c/o mild pain in her left leg. AVS and RX was given and explained to the pt and her husband, they each verbalized understanding of the information.

## 2018-03-13 NOTE — ED Triage Notes (Signed)
Left leg pain, "nerve pain" since Tuesday. Pt takes 5mg  oxycodone X 3 day and neurotin.  Pt alert and oriented X4, active, cooperative, pt in NAD. RR even and unlabored, color WNL.

## 2018-03-13 NOTE — ED Notes (Signed)
Patient drinking ginger ale.  She says the pain is less, but it seems to increase at times.

## 2018-03-13 NOTE — Discharge Instructions (Addendum)
Take the prescription meds as directed. Consider increasing your Neurontin to 2 tabs at the midday dose. Follow-up with your providers as scheduled. Return to the ED as needed.

## 2018-03-15 ENCOUNTER — Other Ambulatory Visit: Payer: Self-pay

## 2018-03-15 ENCOUNTER — Emergency Department: Payer: Medicare Other

## 2018-03-15 ENCOUNTER — Emergency Department
Admission: EM | Admit: 2018-03-15 | Discharge: 2018-03-15 | Disposition: A | Discharge status: Home / Self Care | Payer: Medicare Other | Attending: Emergency Medicine | Admitting: Emergency Medicine

## 2018-03-15 DIAGNOSIS — I10 Essential (primary) hypertension: Secondary | ICD-10-CM | POA: Diagnosis not present

## 2018-03-15 DIAGNOSIS — M545 Low back pain: Secondary | ICD-10-CM | POA: Diagnosis present

## 2018-03-15 DIAGNOSIS — Z79899 Other long term (current) drug therapy: Secondary | ICD-10-CM | POA: Insufficient documentation

## 2018-03-15 DIAGNOSIS — Z87891 Personal history of nicotine dependence: Secondary | ICD-10-CM | POA: Diagnosis not present

## 2018-03-15 DIAGNOSIS — J45909 Unspecified asthma, uncomplicated: Secondary | ICD-10-CM | POA: Insufficient documentation

## 2018-03-15 DIAGNOSIS — M48061 Spinal stenosis, lumbar region without neurogenic claudication: Secondary | ICD-10-CM | POA: Diagnosis not present

## 2018-03-15 DIAGNOSIS — M5432 Sciatica, left side: Secondary | ICD-10-CM | POA: Insufficient documentation

## 2018-03-15 DIAGNOSIS — M25552 Pain in left hip: Secondary | ICD-10-CM | POA: Diagnosis not present

## 2018-03-15 DIAGNOSIS — M5442 Lumbago with sciatica, left side: Secondary | ICD-10-CM | POA: Diagnosis not present

## 2018-03-15 DIAGNOSIS — E039 Hypothyroidism, unspecified: Secondary | ICD-10-CM | POA: Diagnosis not present

## 2018-03-15 DIAGNOSIS — M5116 Intervertebral disc disorders with radiculopathy, lumbar region: Secondary | ICD-10-CM | POA: Diagnosis not present

## 2018-03-15 MED ORDER — ORPHENADRINE CITRATE 30 MG/ML IJ SOLN
60.0000 mg | Freq: Two times a day (BID) | INTRAMUSCULAR | Status: DC
  Administered 2018-03-15: 60 mg via INTRAMUSCULAR
  Filled 2018-03-15: qty 2

## 2018-03-15 MED ORDER — DICLOFENAC SODIUM 1 % TD GEL: 2.0000 g | Freq: Four times a day (QID) | TRANSDERMAL | 0 refills | Status: DC

## 2018-03-15 MED ORDER — PREDNISONE 20 MG PO TABS
20.0000 mg | ORAL_TABLET | Freq: Every day | ORAL | Status: DC
  Administered 2018-03-15: 20 mg via ORAL
  Filled 2018-03-15: qty 1

## 2018-03-15 MED ORDER — LIDOCAINE 5 % EX PTCH
1.0000 | MEDICATED_PATCH | CUTANEOUS | Status: DC
Start: 2018-03-15 — End: 2018-03-15
  Administered 2018-03-15: 1 via TRANSDERMAL
  Filled 2018-03-15: qty 1

## 2018-03-15 MED ORDER — MORPHINE SULFATE (PF) 4 MG/ML IV SOLN
4.0000 mg | Freq: Once | INTRAVENOUS | Status: AC
  Administered 2018-03-15: 4 mg via INTRAMUSCULAR
  Filled 2018-03-15: qty 1

## 2018-03-15 MED ORDER — LIDOCAINE 5 % EX PTCH: 1.0000 | MEDICATED_PATCH | CUTANEOUS | 0 refills | Status: DC

## 2018-03-15 MED ORDER — MELOXICAM 7.5 MG PO TABS
15.0000 mg | ORAL_TABLET | Freq: Once | ORAL | Status: AC
  Administered 2018-03-15: 15 mg via ORAL
  Filled 2018-03-15: qty 2

## 2018-03-15 NOTE — ED Provider Notes (Signed)
[facility] Emergency Department Provider Note  ____________________________________________  Time seen: Approximately 8:06 AM  I have reviewed the triage vital signs and the nursing notes.   HISTORY  Chief Complaint Sciatica    HPI Madison Jennings is a 78 y.o. female that presents to the emergency department for evaluation of low back pain that radiates into the left hip and left leg worsening for 2 weeks.  Patient states that she has had 4 back surgeries, the first 1 being in 1998.  Pain has been consistently worsening over the last couple of weeks.  She sees a pain clinic and takes oxycodone daily.  She was seen in the emergency department 2 days ago and was given a prescription for steroids and Roxicodone for breakthrough pain.  She has also increased her noon dose of gabapentin.  Back pain has not improved.  No bowel or bladder dysfunction or saddle anesthesias.  No trauma.  She has not seen a back doctor in several years.  No leg swelling, foot drop.   Past Medical History:  Diagnosis Date  . Arthritis   . Asthma   . Chronic back pain   . Chronic back pain    scoliosis and 2 buldging disc  . Depression   . Diastolic dysfunction   . Diverticulosis   . Dysrhythmia    PALPITATIONS  . Fibromyalgia   . Gastritis   . GERD (gastroesophageal reflux disease)    takes Protonix daily  . Headache(784.0)    sinus related  . Hyperlipidemia    just diagnosed;will follow up in 6wks to discuss meds  . Hypertension    takes Lisinopril daily  . Hypothyroidism    takes Synthroid daily  . Ischemic colitis ([place])   . Joint pain   . Nocturia   . Obesity   . Vitamin D deficiency    just placed on Vit D this week    Patient Active Problem List   Diagnosis Date Noted  . Pain in joint, shoulder region 11/22/2013  . Shoulder weakness 11/22/2013  . Diarrhea 05/02/2011  . Generalized weakness 05/02/2011  . Dehydration 05/02/2011  . HTN (hypertension) 05/02/2011   . Hypothyroidism 05/02/2011  . GERD (gastroesophageal reflux disease) 05/02/2011    Past Surgical History:  Procedure Laterality Date  . ABDOMINAL HYSTERECTOMY    . APPENDECTOMY    . BACK SURGERY     x 3  . CARDIAC CATHETERIZATION  04/13/2010   normal cororanies, EF 55% ([facility]; Dr. Clayborn Bigness)  . CHOLECYSTECTOMY    . COLONOSCOPY    . COLONOSCOPY WITH PROPOFOL N/A 01/21/2017   Procedure: COLONOSCOPY WITH PROPOFOL;  Surgeon: Lollie Sails, MD;  Location: [facility] ENDOSCOPY;  Service: Endoscopy;  Laterality: N/A;  . CORONARY ANGIOPLASTY    . ESOPHAGOGASTRODUODENOSCOPY  06/12/2011  . ESOPHAGOGASTRODUODENOSCOPY (EGD) WITH PROPOFOL N/A 01/21/2017   Procedure: ESOPHAGOGASTRODUODENOSCOPY (EGD) WITH PROPOFOL;  Surgeon: Lollie Sails, MD;  Location: [facility] ENDOSCOPY;  Service: Endoscopy;  Laterality: N/A;  . FOOT SURGERY Left    d/t neuroma  . OOPHORECTOMY    . radiation to thyroid    . RIGHT/LEFT HEART CATH AND CORONARY ANGIOGRAPHY Bilateral 10/22/2016   Procedure: RIGHT/LEFT HEART CATH AND CORONARY ANGIOGRAPHY;  Surgeon: Yolonda Kida, MD;  Location: [place] [facility];  Service: Cardiovascular;  Laterality: Bilateral;  . SPINAL CORD STIMULATOR INSERTION N/A 07/16/2013   Procedure: LUMBAR SPINAL CORD STIMULATOR INSERTION;  Surgeon: Bonna Gains, MD;  Location: [place];  Service: Neurosurgery;  Laterality:  N/A;    Prior to Admission medications   Medication Sig Start Date End Date Taking? Authorizing Provider  Cholecalciferol (VITAMIN D) 2000 units CAPS Take 2,000 Units by mouth daily.    [provider]  cyclobenzaprine (FLEXERIL) 5 MG tablet Take 1 tablet (5 mg total) by mouth 3 (three) times daily as needed for muscle spasms. 03/13/18   Menshew, Dannielle Karvonen, PA-C  diclofenac sodium (VOLTAREN) 1 % GEL Apply 2 g topically 4 (four) times daily. 03/15/18   Laban Emperor, PA-C  levothyroxine (SYNTHROID, LEVOTHROID) 75 MCG tablet Take 75 mcg by mouth daily before breakfast.     [provider]  lidocaine (LIDODERM) 5 % Place 1 patch onto the skin daily. Remove & Discard patch within 12 hours or as directed by MD 03/15/18   Laban Emperor, PA-C  lisinopril (PRINIVIL,ZESTRIL) 40 MG tablet Take 40 mg by mouth daily.    [provider]  meloxicam (MOBIC) 15 MG tablet Take 15 mg by mouth daily as needed (for pain.).    [provider]  oxyCODONE (OXY IR/ROXICODONE) 5 MG immediate release tablet Take 1 tablet (5 mg total) by mouth 3 (three) times daily as needed for up to 3 days for severe pain. 03/13/18 03/16/18  Menshew, Dannielle Karvonen, PA-C  oxycodone (OXY-IR) 5 MG capsule Take 5 mg by mouth every 4 (four) hours as needed.    [provider]  pantoprazole (PROTONIX) 40 MG tablet Take 40 mg by mouth daily.    [provider]  predniSONE (DELTASONE) 20 MG tablet Take 1 tablet (20 mg total) by mouth 2 (two) times daily with a meal for 5 days. 03/13/18 03/18/18  Menshew, Dannielle Karvonen, PA-C    Allergies Patient has no known allergies.  Family History  Problem Relation Age of Onset  . Breast cancer Neg Hx     Social History Social History   Tobacco Use  . Smoking status: Former Smoker    Packs/day: 0.50    Years: 0.00    Pack years: 0.00    Types: Cigarettes    Last attempt to quit: 06/20/1986    Years since quitting: 31.7  . Smokeless tobacco: Never Used  Substance Use Topics  . Alcohol use: No  . Drug use: No     Review of Systems  Constitutional: No fever/chills Respiratory: No SOB. Gastrointestinal:  No nausea, no vomiting.  Musculoskeletal: Positive for back pain.  Skin: Negative for rash, abrasions, lacerations, ecchymosis. Neurological: Negative for headaches, numbness or tingling   ____________________________________________   PHYSICAL EXAM:  VITAL SIGNS: ED Triage Vitals  Enc Vitals Group     BP 03/15/18 0718 (!) 133/115     Pulse Rate 03/15/18 0717 (!) 53     Resp 03/15/18 0717 16     Temp  03/15/18 0717 98 F (36.7 C)     Temp Source 03/15/18 0717 Oral     SpO2 03/15/18 0717 94 %     Weight 03/15/18 0717 211 lb (95.7 kg)     Height 03/15/18 0717 5\' 4"  (1.626 m)     Head Circumference --      Peak Flow --      Pain Score 03/15/18 0717 9     Pain Loc --      Pain Edu? --      Excl. in [place]? --      Constitutional: Alert and oriented. Well appearing and in no acute distress. Eyes: Conjunctivae are normal. PERRL. EOMI.  Head: Atraumatic. ENT:      Ears:      Nose: No congestion/rhinnorhea.      Mouth/Throat: Mucous membranes are moist.  Neck: No stridor.  Cardiovascular: Normal rate, regular rhythm.  Good peripheral circulation. Respiratory: Normal respiratory effort without tachypnea or retractions. Lungs CTAB. Good air entry to the bases with no decreased or absent breath sounds. Musculoskeletal: Full range of motion to all extremities. No gross deformities appreciated. Pain to left SI and left hip. Full ROM of hip but with pain. Weight bearing. Neurologic:  Normal speech and language. No gross focal neurologic deficits are appreciated.  Skin:  Skin is warm, dry and intact. No rash noted. Psychiatric: Mood and affect are normal. Speech and behavior are normal. Patient exhibits appropriate insight and judgement.   ____________________________________________   LABS (all labs ordered are listed, but only abnormal results are displayed)  Labs Reviewed - No data to display ____________________________________________  EKG   ____________________________________________  RADIOLOGY Robinette Haines, personally viewed and evaluated these images (plain radiographs) as part of my medical decision making, as well as reviewing the written report by the radiologist.  Dg Lumbar Spine 2-3 Views  Result Date: 03/15/2018 CLINICAL DATA:  78 year old with back pain. Sciatica in the left leg. EXAM: LUMBAR SPINE - 2-3 VIEW COMPARISON:  CT 03/21/2017 and lumbar radiograph  08/30/2015 FINDINGS: Again noted is posterior lumbar interbody fusion at L4, L5 and S1 with bilateral pedicle screws, rods and interbody devices. There is a chronic fracture involving the right S1 pedicle screw. Patient has a neural stimulator device extending into the thoracic spine. Alignment of the lumbar spine is unchanged. There is chronic disc space narrowing at L3-L4, L2-L3 and L1-L2. Marked sclerosis and endplate changes along the anterior aspect of L1-L2. Vertebral body heights are maintained. IMPRESSION: No acute abnormality in the lumbar spine. Stable appearance of the posterior lumbar interbody fusion at L4 through S1. Chronic fracture involving the right S1 pedicle screw. Stable disc space narrowing and endplate disease in the lumbar spine as described. Electronically Signed   By: Markus Daft M.D.   On: 03/15/2018 08:22   Dg Hip Unilat W Or Wo Pelvis 2-3 Views Left  Result Date: 03/15/2018 CLINICAL DATA:  Pain with radicular symptoms EXAM: DG HIP (WITH OR WITHOUT PELVIS) 2-3V LEFT COMPARISON:  None. FINDINGS: Frontal pelvis as well as frontal and lateral left hip joint images were obtained. There is no fracture or dislocation. Joint spaces appear normal. There is postoperative change in the lower lumbar and upper sacral regions. There is a stimulator device overlying the superior left iliac crest. IMPRESSION: No fracture or dislocation. Joint spaces appear unremarkable. Postoperative change in lower lumbar/upper sacral region. Electronically Signed   By: Lowella Grip III M.D.   On: 03/15/2018 08:16    ____________________________________________    PROCEDURES  Procedure(s) performed:    Procedures    Medications  morphine 4 MG/ML injection 4 mg (4 mg Intramuscular Given 03/15/18 0753)  meloxicam (MOBIC) tablet 15 mg (15 mg Oral Given 03/15/18 0906)     ____________________________________________   INITIAL IMPRESSION / ASSESSMENT AND PLAN / ED COURSE  Pertinent labs &  imaging results that were available during my care of the patient were reviewed by me and considered in my medical decision making (see chart for details).  Review of the [place] CSRS was performed in accordance of the [place] prior to dispensing any controlled drugs.   Patient's diagnosis is consistent with sciatica. Vital signs and exam  are reassuring. Pain improved with morphine.  She was given an additional dose of prednisone and will take her home 40 mg today as well.  Patient has an appointment tomorrow with pain management. Patient will be discharged home with prescriptions for lidoderm and diclofenac. Patient is to follow up with PCP as directed. Patient is given ED precautions to return to the ED for any worsening or new symptoms.     ____________________________________________  FINAL CLINICAL IMPRESSION(S) / ED DIAGNOSES  Final diagnoses:  Sciatica of left side      NEW MEDICATIONS STARTED DURING THIS VISIT:  ED Discharge Orders         Ordered    lidocaine (LIDODERM) 5 %  Every 24 hours     03/15/18 0853    diclofenac sodium (VOLTAREN) 1 % GEL  4 times daily     03/15/18 0853              This chart was dictated using voice recognition software/Dragon. Despite best efforts to proofread, errors can occur which can change the meaning. Any change was purely unintentional.    Laban Emperor, PA-C 03/15/18 1506    Delman Kitten, MD 03/15/18 1615

## 2018-03-15 NOTE — ED Notes (Signed)
Pt ambulatory in room without difficulty

## 2018-03-15 NOTE — ED Triage Notes (Signed)
Here for sciatica to L side. States the meds that she is taking aren't helping. States "she told me to come back if I wasn't better" ambulated into lobby.

## 2018-03-15 NOTE — ED Notes (Signed)
Esignature pad not working for discharge signature.

## 2018-03-15 NOTE — ED Notes (Addendum)
C/o sciatica, has hx of the same, hx of back surgeries, seen here Friday for the same complaint, was told to come back if not any better. Pt arrived to room via wheelchair.  Pt is prescribed PO pain meds outpatient and has nerve stimulator implant.

## 2018-03-15 NOTE — Discharge Instructions (Addendum)
Please make an appointment with your primary care provider this week.  Follow-up with your pain clinic appointment tomorrow morning.  Take your prescribed medications.  Apply heat to your back today.

## 2018-03-15 NOTE — ED Notes (Signed)
Returned from XR via wheelchair

## 2018-03-15 NOTE — ED Notes (Signed)
ED Provider at bedside. 

## 2018-03-16 ENCOUNTER — Ambulatory Visit
Admission: RE | Admit: 2018-03-16 | Discharge: 2018-03-16 | Disposition: A | Discharge status: Home / Self Care | Payer: Medicare Other | Source: Ambulatory Visit | Attending: Anesthesiology | Admitting: Anesthesiology

## 2018-03-16 ENCOUNTER — Other Ambulatory Visit (HOSPITAL_COMMUNITY): Payer: Self-pay | Admitting: Anesthesiology

## 2018-03-16 ENCOUNTER — Other Ambulatory Visit: Payer: Self-pay | Admitting: Anesthesiology

## 2018-03-16 DIAGNOSIS — M5116 Intervertebral disc disorders with radiculopathy, lumbar region: Secondary | ICD-10-CM | POA: Diagnosis not present

## 2018-03-16 DIAGNOSIS — G894 Chronic pain syndrome: Secondary | ICD-10-CM

## 2018-03-16 DIAGNOSIS — M545 Low back pain, unspecified: Secondary | ICD-10-CM

## 2018-03-16 DIAGNOSIS — M5441 Lumbago with sciatica, right side: Secondary | ICD-10-CM | POA: Insufficient documentation

## 2018-03-16 DIAGNOSIS — G8929 Other chronic pain: Secondary | ICD-10-CM

## 2018-03-16 DIAGNOSIS — G8921 Chronic pain due to trauma: Secondary | ICD-10-CM | POA: Diagnosis not present

## 2018-03-16 DIAGNOSIS — M5442 Lumbago with sciatica, left side: Secondary | ICD-10-CM | POA: Insufficient documentation

## 2018-03-16 DIAGNOSIS — Z79899 Other long term (current) drug therapy: Secondary | ICD-10-CM | POA: Diagnosis not present

## 2018-03-16 DIAGNOSIS — M961 Postlaminectomy syndrome, not elsewhere classified: Secondary | ICD-10-CM | POA: Diagnosis not present

## 2018-03-18 DIAGNOSIS — M545 Low back pain: Secondary | ICD-10-CM | POA: Diagnosis not present

## 2018-03-18 DIAGNOSIS — Z87891 Personal history of nicotine dependence: Secondary | ICD-10-CM | POA: Diagnosis not present

## 2018-03-18 DIAGNOSIS — M5440 Lumbago with sciatica, unspecified side: Secondary | ICD-10-CM | POA: Diagnosis not present

## 2018-03-18 DIAGNOSIS — G8929 Other chronic pain: Secondary | ICD-10-CM | POA: Diagnosis not present

## 2018-03-19 DIAGNOSIS — G992 Myelopathy in diseases classified elsewhere: Secondary | ICD-10-CM | POA: Diagnosis not present

## 2018-03-19 DIAGNOSIS — M4804 Spinal stenosis, thoracic region: Secondary | ICD-10-CM | POA: Diagnosis not present

## 2018-03-19 DIAGNOSIS — I10 Essential (primary) hypertension: Secondary | ICD-10-CM | POA: Diagnosis not present

## 2018-03-20 DIAGNOSIS — J309 Allergic rhinitis, unspecified: Secondary | ICD-10-CM | POA: Diagnosis not present

## 2018-03-20 DIAGNOSIS — R309 Painful micturition, unspecified: Secondary | ICD-10-CM | POA: Diagnosis not present

## 2018-03-20 DIAGNOSIS — I119 Hypertensive heart disease without heart failure: Secondary | ICD-10-CM | POA: Diagnosis not present

## 2018-03-20 DIAGNOSIS — R12 Heartburn: Secondary | ICD-10-CM | POA: Diagnosis not present

## 2018-03-21 ENCOUNTER — Other Ambulatory Visit: Payer: Self-pay

## 2018-03-21 ENCOUNTER — Encounter (HOSPITAL_COMMUNITY): Payer: Self-pay | Admitting: Emergency Medicine

## 2018-03-21 ENCOUNTER — Emergency Department (HOSPITAL_BASED_OUTPATIENT_CLINIC_OR_DEPARTMENT_OTHER): Payer: Medicare Other

## 2018-03-21 ENCOUNTER — Emergency Department (HOSPITAL_COMMUNITY)
Admission: EM | Admit: 2018-03-21 | Discharge: 2018-03-21 | Disposition: A | Discharge status: Home / Self Care | Payer: Medicare Other | Attending: Emergency Medicine | Admitting: Emergency Medicine

## 2018-03-21 DIAGNOSIS — E785 Hyperlipidemia, unspecified: Secondary | ICD-10-CM | POA: Diagnosis not present

## 2018-03-21 DIAGNOSIS — I110 Hypertensive heart disease with heart failure: Secondary | ICD-10-CM | POA: Insufficient documentation

## 2018-03-21 DIAGNOSIS — Z87891 Personal history of nicotine dependence: Secondary | ICD-10-CM | POA: Diagnosis not present

## 2018-03-21 DIAGNOSIS — M545 Low back pain, unspecified: Secondary | ICD-10-CM

## 2018-03-21 DIAGNOSIS — I5032 Chronic diastolic (congestive) heart failure: Secondary | ICD-10-CM | POA: Insufficient documentation

## 2018-03-21 DIAGNOSIS — M79662 Pain in left lower leg: Secondary | ICD-10-CM | POA: Diagnosis not present

## 2018-03-21 DIAGNOSIS — M79609 Pain in unspecified limb: Secondary | ICD-10-CM | POA: Diagnosis not present

## 2018-03-21 DIAGNOSIS — M79605 Pain in left leg: Secondary | ICD-10-CM | POA: Diagnosis not present

## 2018-03-21 DIAGNOSIS — E039 Hypothyroidism, unspecified: Secondary | ICD-10-CM | POA: Insufficient documentation

## 2018-03-21 DIAGNOSIS — J45909 Unspecified asthma, uncomplicated: Secondary | ICD-10-CM | POA: Insufficient documentation

## 2018-03-21 DIAGNOSIS — Z79899 Other long term (current) drug therapy: Secondary | ICD-10-CM | POA: Diagnosis not present

## 2018-03-21 MED ORDER — KETOROLAC TROMETHAMINE 30 MG/ML IJ SOLN
30.0000 mg | Freq: Once | INTRAMUSCULAR | Status: AC
  Administered 2018-03-21: 30 mg via INTRAMUSCULAR
  Filled 2018-03-21: qty 1

## 2018-03-21 MED ORDER — DEXAMETHASONE SODIUM PHOSPHATE 10 MG/ML IJ SOLN
10.0000 mg | Freq: Once | INTRAMUSCULAR | Status: AC
  Administered 2018-03-21: 10 mg via INTRAMUSCULAR
  Filled 2018-03-21: qty 1

## 2018-03-21 MED ORDER — OXYCODONE HCL 5 MG PO TABS
10.0000 mg | ORAL_TABLET | Freq: Once | ORAL | Status: AC
  Administered 2018-03-21: 10 mg via ORAL
  Filled 2018-03-21: qty 2

## 2018-03-21 MED ORDER — CYCLOBENZAPRINE HCL 10 MG PO TABS
10.0000 mg | ORAL_TABLET | Freq: Once | ORAL | Status: AC
  Administered 2018-03-21: 10 mg via ORAL
  Filled 2018-03-21: qty 1

## 2018-03-21 MED ORDER — HYDROMORPHONE HCL 1 MG/ML IJ SOLN
1.0000 mg | Freq: Once | INTRAMUSCULAR | Status: AC
  Administered 2018-03-21: 1 mg via INTRAMUSCULAR
  Filled 2018-03-21: qty 1

## 2018-03-21 MED ORDER — ACETAMINOPHEN 500 MG PO TABS
1000.0000 mg | ORAL_TABLET | Freq: Once | ORAL | Status: AC
  Administered 2018-03-21: 1000 mg via ORAL
  Filled 2018-03-21: qty 2

## 2018-03-21 MED ORDER — LIDOCAINE 5 % EX PTCH
1.0000 | MEDICATED_PATCH | CUTANEOUS | Status: DC
  Administered 2018-03-21: 1 via TRANSDERMAL
  Filled 2018-03-21: qty 1

## 2018-03-21 NOTE — Progress Notes (Signed)
Left lower extremity venous duplex completed. Refer to "CV Proc" under chart review to view preliminary results.  03/21/2018 11:11 AM Maudry Mayhew, MHA, RVT, RDCS, RDMS

## 2018-03-21 NOTE — ED Notes (Signed)
Pt given discharge instructions. Pt verbalized understanding.

## 2018-03-21 NOTE — Discharge Instructions (Signed)
Take the prednisone that was prescribed by your physician. You may increase your oxycodone to 10mg  at night and discuss with your chronic pain provider in setting of acute on chronic back pain.  Take Tylenol 1000 mg 4 times a day for 1 week. This is the maximum dose of Tylenol (acetaminophen) you can take from all sources. Please check other over-the-counter medications and prescriptions to ensure you are not taking other medications that contain acetaminophen.  You may also take ibuprofen 400 mg 6 times a day alternating with or at the same time as tylenol.  Continue to discuss pain management with your outpatient providers and follow up closely with Dr. Ellene Route.

## 2018-03-21 NOTE — ED Provider Notes (Signed)
[facility] EMERGENCY DEPARTMENT Provider Note   CSN: [id number] Arrival date & time: 03/21/18  0535     History   Chief Complaint Chief Complaint  Patient presents with  . Back Pain    Sciatic nerve pain    HPI Madison Jennings is a 78 y.o. female.  HPI  3 weeks of severe back pain radiating down the left leg Taking 5mg  oxycodone TID Neurontin 300mg  TID Prednisone, received it last week for 7 days, and PCP gave more today Taking voltaren and lidocaine patch but didn't put it on today Not taking ibuprofen or meloxicam   Dr. Ellene Route on Thursday, says will need to do myelogram  No fever, no falls Tingling thighs bilateral, lower abdomen Feels weak in her back and legs, left leg worse No loss of control of bowel or bladder, will dribble at times     Past Medical History:  Diagnosis Date  . Arthritis   . Asthma   . Chronic back pain   . Chronic back pain    scoliosis and 2 buldging disc  . Depression   . Diastolic dysfunction   . Diverticulosis   . Dysrhythmia    PALPITATIONS  . Fibromyalgia   . Gastritis   . GERD (gastroesophageal reflux disease)    takes Protonix daily  . Headache(784.0)    sinus related  . Hyperlipidemia    just diagnosed;will follow up in 6wks to discuss meds  . Hypertension    takes Lisinopril daily  . Hypothyroidism    takes Synthroid daily  . Ischemic colitis ([place])   . Joint pain   . Nocturia   . Obesity   . Vitamin D deficiency    just placed on Vit D this week    Patient Active Problem List   Diagnosis Date Noted  . Pain in joint, shoulder region 11/22/2013  . Shoulder weakness 11/22/2013  . Diarrhea 05/02/2011  . Generalized weakness 05/02/2011  . Dehydration 05/02/2011  . HTN (hypertension) 05/02/2011  . Hypothyroidism 05/02/2011  . GERD (gastroesophageal reflux disease) 05/02/2011    Past Surgical History:  Procedure Laterality Date  . ABDOMINAL HYSTERECTOMY    . APPENDECTOMY    . BACK  SURGERY     x 3  . CARDIAC CATHETERIZATION  04/13/2010   normal cororanies, EF 55% ([facility]; Dr. Clayborn Bigness)  . CHOLECYSTECTOMY    . COLONOSCOPY    . COLONOSCOPY WITH PROPOFOL N/A 01/21/2017   Procedure: COLONOSCOPY WITH PROPOFOL;  Surgeon: Lollie Sails, MD;  Location: [facility] ENDOSCOPY;  Service: Endoscopy;  Laterality: N/A;  . CORONARY ANGIOPLASTY    . ESOPHAGOGASTRODUODENOSCOPY  06/12/2011  . ESOPHAGOGASTRODUODENOSCOPY (EGD) WITH PROPOFOL N/A 01/21/2017   Procedure: ESOPHAGOGASTRODUODENOSCOPY (EGD) WITH PROPOFOL;  Surgeon: Lollie Sails, MD;  Location: [facility] ENDOSCOPY;  Service: Endoscopy;  Laterality: N/A;  . FOOT SURGERY Left    d/t neuroma  . OOPHORECTOMY    . radiation to thyroid    . RIGHT/LEFT HEART CATH AND CORONARY ANGIOGRAPHY Bilateral 10/22/2016   Procedure: RIGHT/LEFT HEART CATH AND CORONARY ANGIOGRAPHY;  Surgeon: Yolonda Kida, MD;  Location: [facility] CV LAB;  Service: Cardiovascular;  Laterality: Bilateral;  . SPINAL CORD STIMULATOR INSERTION N/A 07/16/2013   Procedure: LUMBAR SPINAL CORD STIMULATOR INSERTION;  Surgeon: Bonna Gains, MD;  Location: [facility];  Service: Neurosurgery;  Laterality: N/A;     OB History   No obstetric history on file.      Home Medications    Prior to  Admission medications   Medication Sig Start Date End Date Taking? Authorizing Provider  Cholecalciferol (VITAMIN D) 2000 units CAPS Take 2,000 Units by mouth daily.    [provider]  cyclobenzaprine (FLEXERIL) 5 MG tablet Take 1 tablet (5 mg total) by mouth 3 (three) times daily as needed for muscle spasms. 03/13/18   Menshew, Dannielle Karvonen, PA-C  diclofenac sodium (VOLTAREN) 1 % GEL Apply 2 g topically 4 (four) times daily. 03/15/18   Laban Emperor, PA-C  levothyroxine (SYNTHROID, LEVOTHROID) 75 MCG tablet Take 75 mcg by mouth daily before breakfast.    [provider]  lidocaine (LIDODERM) 5 % Place 1 patch onto the skin daily. Remove & Discard patch within 12  hours or as directed by MD 03/15/18   Laban Emperor, PA-C  lisinopril (PRINIVIL,ZESTRIL) 40 MG tablet Take 40 mg by mouth daily.    [provider]  meloxicam (MOBIC) 15 MG tablet Take 15 mg by mouth daily as needed (for pain.).    [provider]  oxycodone (OXY-IR) 5 MG capsule Take 5 mg by mouth every 4 (four) hours as needed.    [provider]  pantoprazole (PROTONIX) 40 MG tablet Take 40 mg by mouth daily.    [provider]    Family History Family History  Problem Relation Age of Onset  . Breast cancer Neg Hx     Social History Social History   Tobacco Use  . Smoking status: Former Smoker    Packs/day: 0.50    Years: 0.00    Pack years: 0.00    Types: Cigarettes    Last attempt to quit: 06/20/1986    Years since quitting: 31.7  . Smokeless tobacco: Never Used  Substance Use Topics  . Alcohol use: No  . Drug use: No     Allergies   Patient has no known allergies.   Review of Systems Review of Systems  Constitutional: Negative for fever.  Respiratory: Negative for shortness of breath.   Cardiovascular: Positive for leg swelling. Negative for chest pain.  Gastrointestinal: Positive for constipation. Negative for abdominal pain, nausea and vomiting.  Genitourinary: Positive for difficulty urinating (reports at times has some difficulty starting stream but other times is normal, no acute concerns today). Negative for dysuria.  Musculoskeletal: Positive for arthralgias, back pain and gait problem.  Skin: Negative for rash.  Neurological: Positive for weakness and numbness (tingling of thighs intermittent). Negative for headaches.     Physical Exam Updated Vital Signs BP (!) 127/56 (BP Location: Right Arm)   Pulse 77   Temp 97.6 F (36.4 C) (Oral)   Resp 18   Ht 5\' 4"  (1.626 m)   Wt 95.7 kg   LMP  (Exact Date)   SpO2 95%   BMI 36.22 kg/m   Physical Exam Vitals signs and nursing note reviewed.  Constitutional:       General: She is not in acute distress.    Appearance: She is well-developed. She is not diaphoretic.  HENT:     Head: Normocephalic and atraumatic.  Eyes:     Conjunctiva/sclera: Conjunctivae normal.  Neck:     Musculoskeletal: Normal range of motion.  Cardiovascular:     Rate and Rhythm: Normal rate and regular rhythm.     Heart sounds: Normal heart sounds. No murmur. No friction rub. No gallop.   Pulmonary:     Effort: Pulmonary effort is normal. No respiratory distress.     Breath sounds: Normal breath sounds. No  wheezing or rales.  Abdominal:     General: There is no distension.     Palpations: Abdomen is soft.     Tenderness: There is no abdominal tenderness. There is no guarding.  Musculoskeletal:        General: No tenderness.  Skin:    General: Skin is warm and dry.     Findings: No erythema or rash.  Neurological:     Mental Status: She is alert and oriented to person, place, and time.     Comments: 5/5 strength LE, normal sensation      ED Treatments / Results  Labs (all labs ordered are listed, but only abnormal results are displayed) Labs Reviewed - No data to display  EKG None  Radiology Vas [place] Lower Extremity Venous (dvt) (only [facility])  Result Date: 03/21/2018  Lower Venous Study Indications: Pain.  Performing Technologist: Maudry Mayhew MHA, RDMS, RVT, RDCS  Examination Guidelines: A complete evaluation includes B-mode imaging, spectral Doppler, color Doppler, and power Doppler as needed of all accessible portions of each vessel. Bilateral testing is considered an integral part of a complete examination. Limited examinations for reoccurring indications may be performed as noted.  Right Venous Findings: +---+---------------+---------+-----------+----------+-------+    CompressibilityPhasicitySpontaneityPropertiesSummary +---+---------------+---------+-----------+----------+-------+ CFVFull           Yes      Yes                           +---+---------------+---------+-----------+----------+-------+  Left Venous Findings: +---------+---------------+---------+-----------+----------+-------+          CompressibilityPhasicitySpontaneityPropertiesSummary +---------+---------------+---------+-----------+----------+-------+ CFV      Full           Yes      Yes                          +---------+---------------+---------+-----------+----------+-------+ SFJ      Full                                                 +---------+---------------+---------+-----------+----------+-------+ FV Prox  Full                                                 +---------+---------------+---------+-----------+----------+-------+ FV Mid   Full                                                 +---------+---------------+---------+-----------+----------+-------+ FV DistalFull                                                 +---------+---------------+---------+-----------+----------+-------+ PFV      Full                                                 +---------+---------------+---------+-----------+----------+-------+ POP  Full           Yes      Yes                          +---------+---------------+---------+-----------+----------+-------+ PTV      Full                                                 +---------+---------------+---------+-----------+----------+-------+ PERO     Full                                                 +---------+---------------+---------+-----------+----------+-------+    Summary: Right: No evidence of common femoral vein obstruction. Left: There is no evidence of deep vein thrombosis in the lower extremity. No cystic structure found in the popliteal fossa.  *See table(s) above for measurements and observations. Electronically signed by Servando Snare MD on 03/21/2018 at 1:29:30 PM.    Final     Procedures Procedures (including critical care time)  Medications Ordered in  ED Medications  ketorolac (TORADOL) 30 MG/ML injection 30 mg (30 mg Intramuscular Given 03/21/18 0839)  dexamethasone (DECADRON) injection 10 mg (10 mg Intramuscular Given 03/21/18 0843)  HYDROmorphone (DILAUDID) injection 1 mg (1 mg Intramuscular Given 03/21/18 0837)  oxyCODONE (Oxy IR/ROXICODONE) immediate release tablet 10 mg (10 mg Oral Given 03/21/18 0945)  acetaminophen (TYLENOL) tablet 1,000 mg (1,000 mg Oral Given 03/21/18 0945)  cyclobenzaprine (FLEXERIL) tablet 10 mg (10 mg Oral Given 03/21/18 0945)     Initial Impression / Assessment and Plan / ED Course  I have reviewed the triage vital signs and the nursing notes.  Pertinent labs & imaging results that were available during my care of the patient were reviewed by me and considered in my medical decision making (see chart for details).     78yo female with history above presents with concern for back pain. Patient has a normal neurologic exam and denies any urinary retention or overflow incontinence, stool incontinence, saddle anesthesia, fever, trauma, chronic steroid use or immunocompromise and have low suspicion suspicion for cauda equina, fracture, epidural abscess, or vertebral osteomyelitis. Reports feeling weak, however has normal strength on exam. Is unable to have MRI and had recent CT lumbar and pelvis which did not show canal stenosis or other pelvic abnormality. Aorta with mild atherosclerosis. No urinary symptoms to suggest UTI.   DVT study negative.  Symptoms most consistent with sciatica, radicular pain from spinal pathology. Patient saw Dr. Ellene Route, [facility] for the same a few days ago.  She and family are most concerned regarding pain control.  Given dilaudid, toradol, lidocaine patch, po oxycodone with improvement of pain. Recommend full dose tylenol, taking ibuprofen along with medications rx by other providers including prednisone and oxycodone written by her pain provider.  Discussed she may increase oxycodone to 10mg  at  night and call her chronic pain doctor on Monday with concern for acute on chronic pain in addition to following up with [facility]. Patient discharged in stable condition with understanding of reasons to return.   Final Clinical Impressions(s) / ED Diagnoses   Final diagnoses:  Low back pain radiating to left lower extremity    ED Discharge Orders  None       Gareth Morgan, MD 03/21/18 2155

## 2018-03-21 NOTE — ED Triage Notes (Signed)
Pt reports left sided sciatic nerve pain 2 week. Pt reports she is being seen by a specialist for same but it is not getting any better.

## 2018-03-23 DIAGNOSIS — E890 Postprocedural hypothyroidism: Secondary | ICD-10-CM | POA: Diagnosis not present

## 2018-03-24 ENCOUNTER — Other Ambulatory Visit (HOSPITAL_COMMUNITY): Payer: Self-pay | Admitting: Neurological Surgery

## 2018-03-24 ENCOUNTER — Other Ambulatory Visit: Payer: Self-pay | Admitting: Neurological Surgery

## 2018-03-24 DIAGNOSIS — M4804 Spinal stenosis, thoracic region: Secondary | ICD-10-CM

## 2018-03-24 DIAGNOSIS — G992 Myelopathy in diseases classified elsewhere: Secondary | ICD-10-CM

## 2018-03-26 MED ORDER — SODIUM CHLORIDE 0.9 % IV SOLN: 4.0000 mg | Freq: Four times a day (QID) | INTRAVENOUS | Status: DC | PRN

## 2018-03-30 DIAGNOSIS — E890 Postprocedural hypothyroidism: Secondary | ICD-10-CM | POA: Diagnosis not present

## 2018-03-31 ENCOUNTER — Other Ambulatory Visit: Payer: Self-pay

## 2018-03-31 ENCOUNTER — Ambulatory Visit (HOSPITAL_COMMUNITY)
Admission: RE | Admit: 2018-03-31 | Discharge: 2018-03-31 | Disposition: A | Discharge status: Home / Self Care | Payer: Medicare Other | Source: Ambulatory Visit | Attending: Neurological Surgery | Admitting: Neurological Surgery

## 2018-03-31 DIAGNOSIS — M5106 Intervertebral disc disorders with myelopathy, lumbar region: Secondary | ICD-10-CM | POA: Diagnosis not present

## 2018-03-31 DIAGNOSIS — M4716 Other spondylosis with myelopathy, lumbar region: Secondary | ICD-10-CM | POA: Insufficient documentation

## 2018-03-31 DIAGNOSIS — M47892 Other spondylosis, cervical region: Secondary | ICD-10-CM | POA: Insufficient documentation

## 2018-03-31 DIAGNOSIS — M5124 Other intervertebral disc displacement, thoracic region: Secondary | ICD-10-CM | POA: Insufficient documentation

## 2018-03-31 DIAGNOSIS — M47894 Other spondylosis, thoracic region: Secondary | ICD-10-CM | POA: Insufficient documentation

## 2018-03-31 DIAGNOSIS — M5126 Other intervertebral disc displacement, lumbar region: Secondary | ICD-10-CM | POA: Diagnosis not present

## 2018-03-31 DIAGNOSIS — M48061 Spinal stenosis, lumbar region without neurogenic claudication: Secondary | ICD-10-CM | POA: Diagnosis not present

## 2018-03-31 DIAGNOSIS — M4804 Spinal stenosis, thoracic region: Secondary | ICD-10-CM

## 2018-03-31 DIAGNOSIS — G039 Meningitis, unspecified: Secondary | ICD-10-CM | POA: Insufficient documentation

## 2018-03-31 DIAGNOSIS — G992 Myelopathy in diseases classified elsewhere: Secondary | ICD-10-CM

## 2018-03-31 DIAGNOSIS — Z969 Presence of functional implant, unspecified: Secondary | ICD-10-CM | POA: Insufficient documentation

## 2018-03-31 DIAGNOSIS — M4802 Spinal stenosis, cervical region: Secondary | ICD-10-CM | POA: Diagnosis not present

## 2018-03-31 MED ORDER — HYDROCODONE-ACETAMINOPHEN 5-325 MG PO TABS
1.0000 | ORAL_TABLET | ORAL | Status: DC | PRN
  Administered 2018-03-31: 1 via ORAL

## 2018-03-31 MED ORDER — LIDOCAINE HCL (PF) 1 % IJ SOLN
5.0000 mL | Freq: Once | INTRAMUSCULAR | Status: AC
  Administered 2018-03-31: 5 mL via INTRADERMAL

## 2018-03-31 MED ORDER — DIAZEPAM 5 MG PO TABS
10.0000 mg | ORAL_TABLET | Freq: Once | ORAL | Status: AC
  Administered 2018-03-31: 10 mg via ORAL

## 2018-03-31 MED ORDER — ONDANSETRON HCL 4 MG/2ML IJ SOLN: 4.0000 mg | Freq: Four times a day (QID) | INTRAMUSCULAR | Status: DC | PRN

## 2018-03-31 MED ORDER — KETOROLAC TROMETHAMINE 15 MG/ML IJ SOLN
INTRAMUSCULAR | Status: AC
  Filled 2018-03-31: qty 2

## 2018-03-31 MED ORDER — KETOROLAC TROMETHAMINE 30 MG/ML IJ SOLN
30.0000 mg | Freq: Once | INTRAMUSCULAR | Status: AC
  Administered 2018-03-31: 30 mg via INTRAMUSCULAR
  Filled 2018-03-31: qty 1

## 2018-03-31 MED ORDER — IOPAMIDOL (ISOVUE-M 300) INJECTION 61%
15.0000 mL | Freq: Once | INTRAMUSCULAR | Status: AC | PRN
  Administered 2018-03-31: 10 mL via INTRATHECAL

## 2018-03-31 MED ORDER — IOPAMIDOL (ISOVUE-M 300) INJECTION 61%
INTRAMUSCULAR | Status: AC
  Filled 2018-03-31: qty 15

## 2018-03-31 MED ORDER — LIDOCAINE HCL (PF) 1 % IJ SOLN
INTRAMUSCULAR | Status: AC
  Administered 2018-03-31: 5 mL via INTRADERMAL
  Filled 2018-03-31: qty 5

## 2018-03-31 MED ORDER — HYDROCODONE-ACETAMINOPHEN 5-325 MG PO TABS
ORAL_TABLET | ORAL | Status: AC
  Filled 2018-03-31: qty 1

## 2018-03-31 MED ORDER — DEXAMETHASONE 4 MG PO TABS
4.0000 mg | ORAL_TABLET | Freq: Once | ORAL | Status: AC
  Administered 2018-03-31: 4 mg via ORAL
  Filled 2018-03-31: qty 1

## 2018-03-31 MED ORDER — DIAZEPAM 5 MG PO TABS
ORAL_TABLET | ORAL | Status: AC
  Administered 2018-03-31: 10 mg via ORAL
  Filled 2018-03-31: qty 2

## 2018-03-31 NOTE — Progress Notes (Signed)
Spoke with Dr. Ellene Route via telephone about patient 10/10 pain.  Verbal orders given - see MAR.  Will continue to monitor patient

## 2018-03-31 NOTE — Discharge Instructions (Signed)
Myelogram, Care After °Refer to this sheet in the next few weeks. These instructions provide you with information about caring for yourself after your procedure. Your health care provider may also give you more specific instructions. Your treatment has been planned according to current medical practices, but problems sometimes occur. Call your health care provider if you have any problems or questions after your procedure. °What can I expect after the procedure? °After the procedure, it is common to have: °· Soreness at your injection site. °· A mild headache. °Follow these instructions at home: °· Drink enough fluid to keep your urine clear or pale yellow. This will help flush out the dye (contrast material) from your spine. °· Rest as told by your health care provider. Lie flat with your head slightly raised (elevated) to reduce the risk of headache. °· Do not bend, lift, or do any strenuous activity for 24-48 hours or as told by your health care provider. °· Take over-the-counter and prescription medicines only as told by your health care provider. °· Take care of and remove your bandage (dressing) as told by your health care provider. °· Bathe or shower as told by your health care provider. °Contact a health care provider if: °· You have a fever. °· You have a headache that lasts longer than 24 hours. °· You feel nauseous or vomit. °· You have a stiff neck or numbness in your legs. °· You are unable to urinate or have a bowel movement. °· You develop a rash, itching, or sneezing. °Get help right away if: °· You have new symptoms or your symptoms get worse. °· You have a seizure. °· You have trouble breathing. °This information is not intended to replace advice given to you by your health care provider. Make sure you discuss any questions you have with your health care provider. °Document Released: 03/10/2015 Document Revised: 07/20/2015 Document Reviewed: 11/24/2014 °Elsevier Interactive Patient Education © 2019  [employer] ° °

## 2018-03-31 NOTE — Procedures (Signed)
Ms. Madison Jennings is a 78 year old individual who has had significant back issues with having undergone a fusion in the past she is also had chronic back pain and has had a spinal cord stimulator placed.  Patient is now having significant cervical symptoms and pain into the shoulders and arms and is concerned about myelopathy because of the spinal cord stimulator she cannot undergo MRI imaging and a myelogram is now being performed with imaging across the cervical thoracic and lumbar regions.  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  Moderate spondylosis at C5-6 and C6-C7 further evaluation with CT scanning

## 2018-04-01 ENCOUNTER — Encounter (HOSPITAL_COMMUNITY): Payer: Self-pay

## 2018-04-01 ENCOUNTER — Other Ambulatory Visit: Payer: Self-pay

## 2018-04-01 ENCOUNTER — Emergency Department (HOSPITAL_COMMUNITY): Payer: Medicare Other

## 2018-04-01 ENCOUNTER — Emergency Department (HOSPITAL_COMMUNITY)
Admission: EM | Admit: 2018-04-01 | Discharge: 2018-04-01 | Disposition: A | Discharge status: Home / Self Care | Payer: Medicare Other | Attending: Emergency Medicine | Admitting: Emergency Medicine

## 2018-04-01 DIAGNOSIS — E039 Hypothyroidism, unspecified: Secondary | ICD-10-CM | POA: Diagnosis not present

## 2018-04-01 DIAGNOSIS — R0789 Other chest pain: Secondary | ICD-10-CM | POA: Diagnosis not present

## 2018-04-01 DIAGNOSIS — R110 Nausea: Secondary | ICD-10-CM | POA: Insufficient documentation

## 2018-04-01 DIAGNOSIS — Z87891 Personal history of nicotine dependence: Secondary | ICD-10-CM | POA: Insufficient documentation

## 2018-04-01 DIAGNOSIS — Z79899 Other long term (current) drug therapy: Secondary | ICD-10-CM | POA: Insufficient documentation

## 2018-04-01 DIAGNOSIS — J45909 Unspecified asthma, uncomplicated: Secondary | ICD-10-CM | POA: Diagnosis not present

## 2018-04-01 DIAGNOSIS — R001 Bradycardia, unspecified: Secondary | ICD-10-CM | POA: Diagnosis not present

## 2018-04-01 DIAGNOSIS — I10 Essential (primary) hypertension: Secondary | ICD-10-CM | POA: Insufficient documentation

## 2018-04-01 DIAGNOSIS — R079 Chest pain, unspecified: Secondary | ICD-10-CM

## 2018-04-01 LAB — I-STAT TROPONIN, ED
Troponin i, poc: 0.01 ng/mL (ref 0.00–0.08)
Troponin i, poc: 0 ng/mL (ref 0.00–0.08)

## 2018-04-01 LAB — COMPREHENSIVE METABOLIC PANEL
ALT: 21 U/L (ref 0–44)
AST: 22 U/L (ref 15–41)
Albumin: 3.6 g/dL (ref 3.5–5.0)
Alkaline Phosphatase: 76 U/L (ref 38–126)
Anion gap: 11 (ref 5–15)
BILIRUBIN TOTAL: 0.9 mg/dL (ref 0.3–1.2)
BUN: 24 mg/dL — AB (ref 8–23)
CO2: 21 mmol/L — ABNORMAL LOW (ref 22–32)
Calcium: 9.3 mg/dL (ref 8.9–10.3)
Chloride: 102 mmol/L (ref 98–111)
Creatinine, Ser: 1.39 mg/dL — ABNORMAL HIGH (ref 0.44–1.00)
GFR calc Af Amer: 43 mL/min — ABNORMAL LOW (ref >60)
GFR calc non Af Amer: 37 mL/min — ABNORMAL LOW (ref >60)
Glucose, Bld: 109 mg/dL — ABNORMAL HIGH (ref 70–99)
Potassium: 4.2 mmol/L (ref 3.5–5.1)
Sodium: 134 mmol/L — ABNORMAL LOW (ref 135–145)
TOTAL PROTEIN: 6.6 g/dL (ref 6.5–8.1)

## 2018-04-01 LAB — CBC WITH DIFFERENTIAL/PLATELET
Abs Immature Granulocytes: 0.04 10*3/uL (ref 0.00–0.07)
Basophils Absolute: 0 10*3/uL (ref 0.0–0.1)
Basophils Relative: 0 %
EOS ABS: 0.1 10*3/uL (ref 0.0–0.5)
Eosinophils Relative: 1 %
HEMATOCRIT: 41 % (ref 36.0–46.0)
Hemoglobin: 13.4 g/dL (ref 12.0–15.0)
Immature Granulocytes: 0 %
LYMPHS ABS: 4.7 10*3/uL — AB (ref 0.7–4.0)
Lymphocytes Relative: 43 %
MCH: 30.8 pg (ref 26.0–34.0)
MCHC: 32.7 g/dL (ref 30.0–36.0)
MCV: 94.3 fL (ref 80.0–100.0)
Monocytes Absolute: 1.2 10*3/uL — ABNORMAL HIGH (ref 0.1–1.0)
Monocytes Relative: 11 %
Neutro Abs: 4.9 10*3/uL (ref 1.7–7.7)
Neutrophils Relative %: 45 %
Platelets: 204 10*3/uL (ref 150–400)
RBC: 4.35 MIL/uL (ref 3.87–5.11)
RDW: 13.4 % (ref 11.5–15.5)
WBC: 10.9 10*3/uL — ABNORMAL HIGH (ref 4.0–10.5)
nRBC: 0 % (ref 0.0–0.2)

## 2018-04-01 NOTE — ED Triage Notes (Signed)
Pt arrives POV from home c/o chest pain, started around 3:30 this morning. Pt states chest pain is "across her chest and in her neck." Pt denies n/v and sweating. Pt has hx of hypertension. Pt a&o x4. VSS at this time

## 2018-04-01 NOTE — ED Notes (Signed)
Patient verbalizes understanding of discharge instructions. Opportunity for questioning and answers were provided. Armband removed by staff, pt discharged from ED.  

## 2018-04-01 NOTE — ED Provider Notes (Signed)
[facility] EMERGENCY DEPARTMENT Provider Note   CSN: [id number] Arrival date & time: 04/01/18  1341     History   Chief Complaint No chief complaint on file.   HPI Madison Jennings is a 78 y.o. female.  HPI   Presents with concern for chest pain episodes Had one episode last night at 330AM, lasted approximately 20 minutes, then had another today on the way to Dr. Clarice Pole office Feels like an aching, heaviness, across the chest to left side and center, when it starts is 7/10 Some nausea No vomiting, no shortness of breath, no diaphoresis No lightheadedness No palpitations Not worse with deep breaths, positions, has not noticed exertional symptoms.   Pain began again this afternoon after 1PM, resolved spontaneously, no CP at this time.  Has had CP before but not like this Had palpitations previously in 2018 and had catheterization done which showed normal coronaries.   Past Medical History:  Diagnosis Date  . Arthritis   . Asthma   . Chronic back pain   . Chronic back pain    scoliosis and 2 buldging disc  . Depression   . Diastolic dysfunction   . Diverticulosis   . Dysrhythmia    PALPITATIONS  . Fibromyalgia   . Gastritis   . GERD (gastroesophageal reflux disease)    takes Protonix daily  . Headache(784.0)    sinus related  . Hyperlipidemia    just diagnosed;will follow up in 6wks to discuss meds  . Hypertension    takes Lisinopril daily  . Hypothyroidism    takes Synthroid daily  . Ischemic colitis ([place])   . Joint pain   . Nocturia   . Obesity   . Vitamin D deficiency    just placed on Vit D this week    Patient Active Problem List   Diagnosis Date Noted  . Pain in joint, shoulder region 11/22/2013  . Shoulder weakness 11/22/2013  . Diarrhea 05/02/2011  . Generalized weakness 05/02/2011  . Dehydration 05/02/2011  . HTN (hypertension) 05/02/2011  . Hypothyroidism 05/02/2011  . GERD (gastroesophageal reflux disease) 05/02/2011     Past Surgical History:  Procedure Laterality Date  . ABDOMINAL HYSTERECTOMY    . APPENDECTOMY    . BACK SURGERY     x 3  . CARDIAC CATHETERIZATION  04/13/2010   normal cororanies, EF 55% ([facility]; Dr. Clayborn Bigness)  . CHOLECYSTECTOMY    . COLONOSCOPY    . COLONOSCOPY WITH PROPOFOL N/A 01/21/2017   Procedure: COLONOSCOPY WITH PROPOFOL;  Surgeon: Lollie Sails, MD;  Location: [facility] ENDOSCOPY;  Service: Endoscopy;  Laterality: N/A;  . CORONARY ANGIOPLASTY    . ESOPHAGOGASTRODUODENOSCOPY  06/12/2011  . ESOPHAGOGASTRODUODENOSCOPY (EGD) WITH PROPOFOL N/A 01/21/2017   Procedure: ESOPHAGOGASTRODUODENOSCOPY (EGD) WITH PROPOFOL;  Surgeon: Lollie Sails, MD;  Location: [facility] ENDOSCOPY;  Service: Endoscopy;  Laterality: N/A;  . FOOT SURGERY Left    d/t neuroma  . OOPHORECTOMY    . radiation to thyroid    . RIGHT/LEFT HEART CATH AND CORONARY ANGIOGRAPHY Bilateral 10/22/2016   Procedure: RIGHT/LEFT HEART CATH AND CORONARY ANGIOGRAPHY;  Surgeon: Yolonda Kida, MD;  Location: [facility];  Service: Cardiovascular;  Laterality: Bilateral;  . SPINAL CORD STIMULATOR INSERTION N/A 07/16/2013   Procedure: LUMBAR SPINAL CORD STIMULATOR INSERTION;  Surgeon: Bonna Gains, MD;  Location: [facility];  Service: Neurosurgery;  Laterality: N/A;     OB History   No obstetric history on file.      Home  Medications    Prior to Admission medications   Medication Sig Start Date End Date Taking? Authorizing Provider  cyclobenzaprine (FLEXERIL) 5 MG tablet Take 1 tablet (5 mg total) by mouth 3 (three) times daily as needed for muscle spasms. 03/13/18   Menshew, Dannielle Karvonen, PA-C  diclofenac sodium (VOLTAREN) 1 % GEL Apply 2 g topically 4 (four) times daily. Patient taking differently: Apply 2 g topically 4 (four) times daily as needed (pain).  03/15/18   Laban Emperor, PA-C  gabapentin (NEURONTIN) 300 MG capsule Take 300 mg by mouth 3 (three) times daily.    [provider]   levothyroxine (SYNTHROID, LEVOTHROID) 75 MCG tablet Take 75 mcg by mouth daily before breakfast.    [provider]  lisinopril (PRINIVIL,ZESTRIL) 40 MG tablet Take 40 mg by mouth daily.    [provider]  Oxycodone HCl 10 MG TABS Take 10 mg by mouth every 8 (eight) hours.     [provider]  pantoprazole (PROTONIX) 40 MG tablet Take 40 mg by mouth daily.    [provider]    Family History Family History  Problem Relation Age of Onset  . Breast cancer Neg Hx     Social History Social History   Tobacco Use  . Smoking status: Former Smoker    Packs/day: 0.50    Years: 0.00    Pack years: 0.00    Types: Cigarettes    Last attempt to quit: 06/20/1986    Years since quitting: 31.8  . Smokeless tobacco: Never Used  Substance Use Topics  . Alcohol use: No  . Drug use: No     Allergies   Patient has no known allergies.   Review of Systems Review of Systems  Constitutional: Negative for diaphoresis and fever.  Eyes: Negative for visual disturbance.  Respiratory: Negative for cough and shortness of breath.   Cardiovascular: Positive for chest pain. Negative for leg swelling.  Gastrointestinal: Positive for nausea. Negative for abdominal pain and vomiting.  Genitourinary: Negative for difficulty urinating.  Musculoskeletal: Positive for back pain. Negative for neck pain.  Skin: Negative for rash.  Neurological: Negative for syncope and headaches.     Physical Exam Updated Vital Signs BP (!) 112/55   Pulse (!) 57   Temp (!) 97.3 F (36.3 C) (Oral)   Resp (!) 21   Ht 5\' 5"  (1.651 m)   Wt 93.9 kg   SpO2 98%   BMI 34.45 kg/m   Physical Exam Vitals signs and nursing note reviewed.  Constitutional:      General: She is not in acute distress.    Appearance: She is well-developed. She is not diaphoretic.  HENT:     Head: Normocephalic and atraumatic.  Eyes:     Conjunctiva/sclera: Conjunctivae normal.  Neck:      Musculoskeletal: Normal range of motion.  Cardiovascular:     Rate and Rhythm: Normal rate and regular rhythm.     Heart sounds: Normal heart sounds. No murmur. No friction rub. No gallop.   Pulmonary:     Effort: Pulmonary effort is normal. No respiratory distress.     Breath sounds: Normal breath sounds. No wheezing or rales.  Abdominal:     General: There is no distension.     Palpations: Abdomen is soft.     Tenderness: There is no abdominal tenderness. There is no guarding.  Musculoskeletal:        General: No tenderness.  Skin:    General: Skin  is warm and dry.     Findings: No erythema or rash.  Neurological:     Mental Status: She is alert and oriented to person, place, and time.      ED Treatments / Results  Labs (all labs ordered are listed, but only abnormal results are displayed) Labs Reviewed  CBC WITH DIFFERENTIAL/PLATELET - Abnormal; Notable for the following components:      Result Value   WBC 10.9 (*)    Lymphs Abs 4.7 (*)    Monocytes Absolute 1.2 (*)    All other components within normal limits  COMPREHENSIVE METABOLIC PANEL - Abnormal; Notable for the following components:   Sodium 134 (*)    CO2 21 (*)    Glucose, Bld 109 (*)    BUN 24 (*)    Creatinine, Ser 1.39 (*)    GFR calc non Af Amer 37 (*)    GFR calc Af Amer 43 (*)    All other components within normal limits  I-STAT TROPONIN, ED    EKG EKG Interpretation  Date/Time:  Wednesday April 01 2018 14:00:28 EST Ventricular Rate:  52 PR Interval:  188 QRS Duration: 96 QT Interval:  440 QTC Calculation: 409 R Axis:   -12 Text Interpretation:  Sinus bradycardia with marked sinus arrhythmia Incomplete right bundle branch block Borderline ECG No significant change since last tracing Confirmed by Gareth Morgan [phone]) on 04/01/2018 2:14:55 PM Also confirmed by Gareth Morgan [id number])  on 04/01/2018 4:10:03 PM   Radiology Dg Chest 2 View  Result Date: 04/01/2018 CLINICAL DATA:  Episode  chest pain at 3:30 a.m. last night and 2 p.m. today. EXAM: CHEST - 2 VIEW COMPARISON:  PA and lateral chest 07/25/2014. FINDINGS: The lungs are clear. Heart size is normal. Aortic atherosclerosis is noted. No pneumothorax or pleural fluid. No acute bony abnormality. Spinal stimulator is in place. There is partial visualization of lumbar fusion hardware. Lower thoracic and upper lumbar spondylosis noted. IMPRESSION: No acute disease. Atherosclerosis. Electronically Signed   By: Inge Rise M.D.   On: 04/01/2018 14:52   Ct Cervical Spine W Contrast  Result Date: 03/31/2018 CLINICAL DATA:  Previous lumbosacral fusion. Previous neurostimulator. Spinal stenosis. Back and leg pain. Shoulder and arm pain. FLUOROSCOPY TIME:  0 minutes 48 seconds. 1005.10 micro gray meter squared PROCEDURE: LUMBAR PUNCTURE FOR CERVICAL LUMBAR AND THORACIC MYELOGRAM CERVICAL AND LUMBAR AND THORACIC MYELOGRAM CT CERVICAL MYELOGRAM CT LUMBAR MYELOGRAM CT THORACIC MYELOGRAM Lumbar puncture and contrast injection was performed by Dr. Ellene Route. I personally performed the  acquisition of the myelogram images. TECHNIQUE: Contiguous axial images were obtained through the Cervical, Thoracic, and Lumbar spine after the intrathecal infusion of infusion. Coronal and sagittal reconstructions were obtained of the axial image sets. FINDINGS: CERVICAL AND LUMBAR MYELOGRAM FINDINGS: Lumbar puncture was done at the L2-3 level. There is been previous discectomy and fusion from L4 to the sacrum with pedicle screws and posterior rods. Screw fractures noted on the right at S1. There is no central canal stenosis. Disc degeneration at L1-2 with vacuum phenomenon but no apparent compressive stenosis. Chronic disc space narrowing L2-3 and L3-4. 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 In the cervical region, there are anterior extradural defects from C3-4 through C6-7. Slightly diminished root sleeve filling on both sides at those levels. No  critical stenosis seen. CT CERVICAL MYELOGRAM FINDINGS: Foramen magnum is widely patent.  C1-2 and C2-3 are normal. C3-4: Mild bulging of the disc and mild uncovertebral prominence. No canal or  foraminal stenosis. C4-5: Mild bulging of the disc and mild uncovertebral prominence. Mild facet degeneration on the left. No compressive canal stenosis. Mild bilateral foraminal narrowing. C5-6: Spondylosis with endplate osteophytes and bulging of the disc. Foraminal encroachment by osteophytes right more than left. The right C6 nerve could be affected. C6-7: Spondylosis with endplate osteophytes and bulging of the disc. No compressive central canal stenosis. Mild osteophytic encroachment upon the foramina without likely compressive stenosis. C7-T1: Facet osteoarthritis on the right which could be painful. No central canal stenosis. Mild right foraminal stenosis. Presumably benign sclerotic focus within the left side of the T1 vertebral body. CT LUMBAR MYELOGRAM FINDINGS: L1-2: Advanced disc degeneration of vacuum phenomenon. Endplate osteophytes and bulging of the disc. Facet and ligamentous hypertrophy more on the right. Right lateral recess stenosis that could possibly cause neural compression. Findings at this level could certainly be associated with back pain. L2-3: Solid fusion posteriorly. No disc pathology. No canal or foraminal stenosis. L3-4: Solid fusion posteriorly. No disc pathology. Wide patency of the canal and foramina. Nerve root clumping suggesting arachnoiditis. L4 to sacrum: Chronic solid fusion. Wide patency of the canal and foramina. Chronic screw fracture on the right at S1 but without evidence of regional motion. Some clumping of the nerve roots suggesting a degree of arachnoiditis. CT THORACIC MYELOGRAM FINDINGS: Mild curvature convex to the right. No antero or retrolisthesis. Neuro stimulators in place in the dorsal spinal canal from the T6-7 disc level to the T9-10 disc level. No significant  finding at T7-8 or above. No disc bulge or herniation. Ordinary upper thoracic facet osteoarthritis. T8-9: Disc bulge. No compressive stenosis. Mild facet degeneration. T9-10: Pronounced disc degeneration with loss of disc height and sclerotic endplate changes. Mild bulging of the disc. Mild narrowing of the lateral recesses. Bilateral foraminal stenosis. Findings at this level could be symptomatic. T10-11: Disc degeneration with a left posterolateral to foraminal disc herniation. This indents the thecal sac. Left-sided neural compression possible at this level. T11-12: Mild bulging of the disc.  No canal or foraminal stenosis. T12-L1: Mild bulging of the disc. Mild facet and ligamentous hypertrophy. No compressive stenosis. IMPRESSION: Cervical region: Degenerative spondylosis from C3-4 through C6-7. No compressive central canal stenosis. Osteophytic encroachment could potentially cause neural compression on the right at C5-6. Facet arthropathy on the right at C7-T1 with foraminal encroachment that could possibly affect the right C8 nerve. Foraminal narrowing at other levels appears mild to moderate at most without likely neural compression. Thoracic region: Neuro stimulators appear well positioned. Advanced degenerative spondylosis at T9-10 with sclerotic bone changes. Mild narrowing of the lateral recesses. Foraminal narrowing that could be symptomatic. T10-11 left posterolateral to foraminal disc herniation which could cause left-sided neural compression. Lumbar region: Solid fusion from L2 to the sacrum with wide patency of the canal and foramina. Arachnoiditis pattern is present. Adjacent segment degenerative changes at L1-2 with degenerative spondylosis showing disc narrowing, vacuum phenomenon and chronic endplate changes. Bulging of the disc and facet osteoarthritis. Lateral recess narrowing on the right which could possibly cause neural compression. The findings at this level could be associated with  lumbago. Electronically Signed   By: Nelson Chimes M.D.   On: 03/31/2018 10:57   Ct Thoracic Spine W Contrast  Result Date: 03/31/2018 CLINICAL DATA:  Previous lumbosacral fusion. Previous neurostimulator. Spinal stenosis. Back and leg pain. Shoulder and arm pain. FLUOROSCOPY TIME:  0 minutes 48 seconds. 1005.10 micro gray meter squared PROCEDURE: LUMBAR PUNCTURE FOR CERVICAL LUMBAR AND THORACIC MYELOGRAM CERVICAL  AND LUMBAR AND THORACIC MYELOGRAM CT CERVICAL MYELOGRAM CT LUMBAR MYELOGRAM CT THORACIC MYELOGRAM Lumbar puncture and contrast injection was performed by Dr. Ellene Route. I personally performed the  acquisition of the myelogram images. TECHNIQUE: Contiguous axial images were obtained through the Cervical, Thoracic, and Lumbar spine after the intrathecal infusion of infusion. Coronal and sagittal reconstructions were obtained of the axial image sets. FINDINGS: CERVICAL AND LUMBAR MYELOGRAM FINDINGS: Lumbar puncture was done at the L2-3 level. There is been previous discectomy and fusion from L4 to the sacrum with pedicle screws and posterior rods. Screw fractures noted on the right at S1. There is no central canal stenosis. Disc degeneration at L1-2 with vacuum phenomenon but no apparent compressive stenosis. Chronic disc space narrowing L2-3 and L3-4. 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 In the cervical region, there are anterior extradural defects from C3-4 through C6-7. Slightly diminished root sleeve filling on both sides at those levels. No critical stenosis seen. CT CERVICAL MYELOGRAM FINDINGS: Foramen magnum is widely patent.  C1-2 and C2-3 are normal. C3-4: Mild bulging of the disc and mild uncovertebral prominence. No canal or foraminal stenosis. C4-5: Mild bulging of the disc and mild uncovertebral prominence. Mild facet degeneration on the left. No compressive canal stenosis. Mild bilateral foraminal narrowing. C5-6: Spondylosis with endplate osteophytes and bulging of the disc.  Foraminal encroachment by osteophytes right more than left. The right C6 nerve could be affected. C6-7: Spondylosis with endplate osteophytes and bulging of the disc. No compressive central canal stenosis. Mild osteophytic encroachment upon the foramina without likely compressive stenosis. C7-T1: Facet osteoarthritis on the right which could be painful. No central canal stenosis. Mild right foraminal stenosis. Presumably benign sclerotic focus within the left side of the T1 vertebral body. CT LUMBAR MYELOGRAM FINDINGS: L1-2: Advanced disc degeneration of vacuum phenomenon. Endplate osteophytes and bulging of the disc. Facet and ligamentous hypertrophy more on the right. Right lateral recess stenosis that could possibly cause neural compression. Findings at this level could certainly be associated with back pain. L2-3: Solid fusion posteriorly. No disc pathology. No canal or foraminal stenosis. L3-4: Solid fusion posteriorly. No disc pathology. Wide patency of the canal and foramina. Nerve root clumping suggesting arachnoiditis. L4 to sacrum: Chronic solid fusion. Wide patency of the canal and foramina. Chronic screw fracture on the right at S1 but without evidence of regional motion. Some clumping of the nerve roots suggesting a degree of arachnoiditis. CT THORACIC MYELOGRAM FINDINGS: Mild curvature convex to the right. No antero or retrolisthesis. Neuro stimulators in place in the dorsal spinal canal from the T6-7 disc level to the T9-10 disc level. No significant finding at T7-8 or above. No disc bulge or herniation. Ordinary upper thoracic facet osteoarthritis. T8-9: Disc bulge. No compressive stenosis. Mild facet degeneration. T9-10: Pronounced disc degeneration with loss of disc height and sclerotic endplate changes. Mild bulging of the disc. Mild narrowing of the lateral recesses. Bilateral foraminal stenosis. Findings at this level could be symptomatic. T10-11: Disc degeneration with a left posterolateral to  foraminal disc herniation. This indents the thecal sac. Left-sided neural compression possible at this level. T11-12: Mild bulging of the disc.  No canal or foraminal stenosis. T12-L1: Mild bulging of the disc. Mild facet and ligamentous hypertrophy. No compressive stenosis. IMPRESSION: Cervical region: Degenerative spondylosis from C3-4 through C6-7. No compressive central canal stenosis. Osteophytic encroachment could potentially cause neural compression on the right at C5-6. Facet arthropathy on the right at C7-T1 with foraminal encroachment that could possibly affect  the right C8 nerve. Foraminal narrowing at other levels appears mild to moderate at most without likely neural compression. Thoracic region: Neuro stimulators appear well positioned. Advanced degenerative spondylosis at T9-10 with sclerotic bone changes. Mild narrowing of the lateral recesses. Foraminal narrowing that could be symptomatic. T10-11 left posterolateral to foraminal disc herniation which could cause left-sided neural compression. Lumbar region: Solid fusion from L2 to the sacrum with wide patency of the canal and foramina. Arachnoiditis pattern is present. Adjacent segment degenerative changes at L1-2 with degenerative spondylosis showing disc narrowing, vacuum phenomenon and chronic endplate changes. Bulging of the disc and facet osteoarthritis. Lateral recess narrowing on the right which could possibly cause neural compression. The findings at this level could be associated with lumbago. Electronically Signed   By: Nelson Chimes M.D.   On: 03/31/2018 10:57   Ct Lumbar Spine W Contrast  Result Date: 03/31/2018 CLINICAL DATA:  Previous lumbosacral fusion. Previous neurostimulator. Spinal stenosis. Back and leg pain. Shoulder and arm pain. FLUOROSCOPY TIME:  0 minutes 48 seconds. 1005.10 micro gray meter squared PROCEDURE: LUMBAR PUNCTURE FOR CERVICAL LUMBAR AND THORACIC MYELOGRAM CERVICAL AND LUMBAR AND THORACIC MYELOGRAM CT CERVICAL  MYELOGRAM CT LUMBAR MYELOGRAM CT THORACIC MYELOGRAM Lumbar puncture and contrast injection was performed by Dr. Ellene Route. I personally performed the  acquisition of the myelogram images. TECHNIQUE: Contiguous axial images were obtained through the Cervical, Thoracic, and Lumbar spine after the intrathecal infusion of infusion. Coronal and sagittal reconstructions were obtained of the axial image sets. FINDINGS: CERVICAL AND LUMBAR MYELOGRAM FINDINGS: Lumbar puncture was done at the L2-3 level. There is been previous discectomy and fusion from L4 to the sacrum with pedicle screws and posterior rods. Screw fractures noted on the right at S1. There is no central canal stenosis. Disc degeneration at L1-2 with vacuum phenomenon but no apparent compressive stenosis. Chronic disc space narrowing L2-3 and L3-4. 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 In the cervical region, there are anterior extradural defects from C3-4 through C6-7. Slightly diminished root sleeve filling on both sides at those levels. No critical stenosis seen. CT CERVICAL MYELOGRAM FINDINGS: Foramen magnum is widely patent.  C1-2 and C2-3 are normal. C3-4: Mild bulging of the disc and mild uncovertebral prominence. No canal or foraminal stenosis. C4-5: Mild bulging of the disc and mild uncovertebral prominence. Mild facet degeneration on the left. No compressive canal stenosis. Mild bilateral foraminal narrowing. C5-6: Spondylosis with endplate osteophytes and bulging of the disc. Foraminal encroachment by osteophytes right more than left. The right C6 nerve could be affected. C6-7: Spondylosis with endplate osteophytes and bulging of the disc. No compressive central canal stenosis. Mild osteophytic encroachment upon the foramina without likely compressive stenosis. C7-T1: Facet osteoarthritis on the right which could be painful. No central canal stenosis. Mild right foraminal stenosis. Presumably benign sclerotic focus within the left  side of the T1 vertebral body. CT LUMBAR MYELOGRAM FINDINGS: L1-2: Advanced disc degeneration of vacuum phenomenon. Endplate osteophytes and bulging of the disc. Facet and ligamentous hypertrophy more on the right. Right lateral recess stenosis that could possibly cause neural compression. Findings at this level could certainly be associated with back pain. L2-3: Solid fusion posteriorly. No disc pathology. No canal or foraminal stenosis. L3-4: Solid fusion posteriorly. No disc pathology. Wide patency of the canal and foramina. Nerve root clumping suggesting arachnoiditis. L4 to sacrum: Chronic solid fusion. Wide patency of the canal and foramina. Chronic screw fracture on the right at S1 but without evidence of regional motion.  Some clumping of the nerve roots suggesting a degree of arachnoiditis. CT THORACIC MYELOGRAM FINDINGS: Mild curvature convex to the right. No antero or retrolisthesis. Neuro stimulators in place in the dorsal spinal canal from the T6-7 disc level to the T9-10 disc level. No significant finding at T7-8 or above. No disc bulge or herniation. Ordinary upper thoracic facet osteoarthritis. T8-9: Disc bulge. No compressive stenosis. Mild facet degeneration. T9-10: Pronounced disc degeneration with loss of disc height and sclerotic endplate changes. Mild bulging of the disc. Mild narrowing of the lateral recesses. Bilateral foraminal stenosis. Findings at this level could be symptomatic. T10-11: Disc degeneration with a left posterolateral to foraminal disc herniation. This indents the thecal sac. Left-sided neural compression possible at this level. T11-12: Mild bulging of the disc.  No canal or foraminal stenosis. T12-L1: Mild bulging of the disc. Mild facet and ligamentous hypertrophy. No compressive stenosis. IMPRESSION: Cervical region: Degenerative spondylosis from C3-4 through C6-7. No compressive central canal stenosis. Osteophytic encroachment could potentially cause neural compression on  the right at C5-6. Facet arthropathy on the right at C7-T1 with foraminal encroachment that could possibly affect the right C8 nerve. Foraminal narrowing at other levels appears mild to moderate at most without likely neural compression. Thoracic region: Neuro stimulators appear well positioned. Advanced degenerative spondylosis at T9-10 with sclerotic bone changes. Mild narrowing of the lateral recesses. Foraminal narrowing that could be symptomatic. T10-11 left posterolateral to foraminal disc herniation which could cause left-sided neural compression. Lumbar region: Solid fusion from L2 to the sacrum with wide patency of the canal and foramina. Arachnoiditis pattern is present. Adjacent segment degenerative changes at L1-2 with degenerative spondylosis showing disc narrowing, vacuum phenomenon and chronic endplate changes. Bulging of the disc and facet osteoarthritis. Lateral recess narrowing on the right which could possibly cause neural compression. The findings at this level could be associated with lumbago. Electronically Signed   By: Nelson Chimes M.D.   On: 03/31/2018 10:57   Dg Myelogram 2+ Regions  Result Date: 03/31/2018 CLINICAL DATA:  Previous lumbosacral fusion. Previous neurostimulator. Spinal stenosis. Back and leg pain. Shoulder and arm pain. FLUOROSCOPY TIME:  0 minutes 48 seconds. 1005.10 micro gray meter squared PROCEDURE: LUMBAR PUNCTURE FOR CERVICAL LUMBAR AND THORACIC MYELOGRAM CERVICAL AND LUMBAR AND THORACIC MYELOGRAM CT CERVICAL MYELOGRAM CT LUMBAR MYELOGRAM CT THORACIC MYELOGRAM Lumbar puncture and contrast injection was performed by Dr. Ellene Route. I personally performed the  acquisition of the myelogram images. TECHNIQUE: Contiguous axial images were obtained through the Cervical, Thoracic, and Lumbar spine after the intrathecal infusion of infusion. Coronal and sagittal reconstructions were obtained of the axial image sets. FINDINGS: CERVICAL AND LUMBAR MYELOGRAM FINDINGS: Lumbar  puncture was done at the L2-3 level. There is been previous discectomy and fusion from L4 to the sacrum with pedicle screws and posterior rods. Screw fractures noted on the right at S1. There is no central canal stenosis. Disc degeneration at L1-2 with vacuum phenomenon but no apparent compressive stenosis. Chronic disc space narrowing L2-3 and L3-4. 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 In the cervical region, there are anterior extradural defects from C3-4 through C6-7. Slightly diminished root sleeve filling on both sides at those levels. No critical stenosis seen. CT CERVICAL MYELOGRAM FINDINGS: Foramen magnum is widely patent.  C1-2 and C2-3 are normal. C3-4: Mild bulging of the disc and mild uncovertebral prominence. No canal or foraminal stenosis. C4-5: Mild bulging of the disc and mild uncovertebral prominence. Mild facet degeneration on the left. No compressive  canal stenosis. Mild bilateral foraminal narrowing. C5-6: Spondylosis with endplate osteophytes and bulging of the disc. Foraminal encroachment by osteophytes right more than left. The right C6 nerve could be affected. C6-7: Spondylosis with endplate osteophytes and bulging of the disc. No compressive central canal stenosis. Mild osteophytic encroachment upon the foramina without likely compressive stenosis. C7-T1: Facet osteoarthritis on the right which could be painful. No central canal stenosis. Mild right foraminal stenosis. Presumably benign sclerotic focus within the left side of the T1 vertebral body. CT LUMBAR MYELOGRAM FINDINGS: L1-2: Advanced disc degeneration of vacuum phenomenon. Endplate osteophytes and bulging of the disc. Facet and ligamentous hypertrophy more on the right. Right lateral recess stenosis that could possibly cause neural compression. Findings at this level could certainly be associated with back pain. L2-3: Solid fusion posteriorly. No disc pathology. No canal or foraminal stenosis. L3-4: Solid fusion  posteriorly. No disc pathology. Wide patency of the canal and foramina. Nerve root clumping suggesting arachnoiditis. L4 to sacrum: Chronic solid fusion. Wide patency of the canal and foramina. Chronic screw fracture on the right at S1 but without evidence of regional motion. Some clumping of the nerve roots suggesting a degree of arachnoiditis. CT THORACIC MYELOGRAM FINDINGS: Mild curvature convex to the right. No antero or retrolisthesis. Neuro stimulators in place in the dorsal spinal canal from the T6-7 disc level to the T9-10 disc level. No significant finding at T7-8 or above. No disc bulge or herniation. Ordinary upper thoracic facet osteoarthritis. T8-9: Disc bulge. No compressive stenosis. Mild facet degeneration. T9-10: Pronounced disc degeneration with loss of disc height and sclerotic endplate changes. Mild bulging of the disc. Mild narrowing of the lateral recesses. Bilateral foraminal stenosis. Findings at this level could be symptomatic. T10-11: Disc degeneration with a left posterolateral to foraminal disc herniation. This indents the thecal sac. Left-sided neural compression possible at this level. T11-12: Mild bulging of the disc.  No canal or foraminal stenosis. T12-L1: Mild bulging of the disc. Mild facet and ligamentous hypertrophy. No compressive stenosis. IMPRESSION: Cervical region: Degenerative spondylosis from C3-4 through C6-7. No compressive central canal stenosis. Osteophytic encroachment could potentially cause neural compression on the right at C5-6. Facet arthropathy on the right at C7-T1 with foraminal encroachment that could possibly affect the right C8 nerve. Foraminal narrowing at other levels appears mild to moderate at most without likely neural compression. Thoracic region: Neuro stimulators appear well positioned. Advanced degenerative spondylosis at T9-10 with sclerotic bone changes. Mild narrowing of the lateral recesses. Foraminal narrowing that could be symptomatic. T10-11  left posterolateral to foraminal disc herniation which could cause left-sided neural compression. Lumbar region: Solid fusion from L2 to the sacrum with wide patency of the canal and foramina. Arachnoiditis pattern is present. Adjacent segment degenerative changes at L1-2 with degenerative spondylosis showing disc narrowing, vacuum phenomenon and chronic endplate changes. Bulging of the disc and facet osteoarthritis. Lateral recess narrowing on the right which could possibly cause neural compression. The findings at this level could be associated with lumbago. Electronically Signed   By: Nelson Chimes M.D.   On: 03/31/2018 10:57    Procedures Procedures (including critical care time)  Medications Ordered in ED Medications - No data to display   Initial Impression / Assessment and Plan / ED Course  I have reviewed the triage vital signs and the nursing notes.  Pertinent labs & imaging results that were available during my care of the patient were reviewed by me and considered in my medical decision making (see chart  for details).     78yo female with history of htn, hlpd, hypothyroidism, ischemic colitis, chronic back pain, who presents with concern for episodes of chest pain.  EKG with sinus bradycardia, no acute changes from prior. Initial troponin negative.   Chest x-ray without signs of pneumonia, pneumothorax, or pulmonary edema.  Labs overall are similar to prior, with mild elevation in creatinine.  She has equal upper and lower extremity pulses, history and physical exam are not consistent with aortic dissection.  No dyspnea, no tachypnea, no tachycardia, no signs of DVT, and have low suspicion for pulmonary embolus.  She had a catheterization in 09/2016 with Dr. Clayborn Bigness that showed normal coronaries.  Given Catheterization within 2 years, do not feel further admission for cardiac rule out is indicated at this time and feel she is appropriate for continued outpatient evaluation if second  troponin is negative.  She is chest pain free in the ED. Attempted to call patient's Cardiologist, Dr. Clayborn Bigness regarding her care.  Care signed out to Dr. Alvino Chapel with second troponin pending.      Final Clinical Impressions(s) / ED Diagnoses   Final diagnoses:  Chest pain, unspecified type    ED Discharge Orders    None       Gareth Morgan, MD 04/01/18 [phone]

## 2018-04-01 NOTE — ED Notes (Signed)
Patient transported to X-ray 

## 2018-04-01 NOTE — ED Provider Notes (Signed)
  Physical Exam  BP 114/71   Pulse 60   Temp (!) 97.3 F (36.3 C) (Oral)   Resp 20   Ht 5\' 5"  (1.651 m)   Wt 93.9 kg   SpO2 94%   BMI 34.45 kg/m   Physical Exam  ED Course/Procedures     Procedures  MDM  Received patient in signout.  Delta troponin done for cardiac risk factors.  Second troponin negative.  Discussed with patient will discharge home.    Davonna Belling, MD 04/01/18 [phone]

## 2018-04-02 DIAGNOSIS — M5104 Intervertebral disc disorders with myelopathy, thoracic region: Secondary | ICD-10-CM | POA: Diagnosis not present

## 2018-04-02 DIAGNOSIS — M5124 Other intervertebral disc displacement, thoracic region: Secondary | ICD-10-CM | POA: Diagnosis not present

## 2018-04-07 ENCOUNTER — Other Ambulatory Visit: Payer: Self-pay | Admitting: Neurological Surgery

## 2018-04-15 NOTE — Progress Notes (Addendum)
PCP - Cletis Athens, MD Cardiologist - Lujean Amel, MD  Chest x-ray - 04/01/2018 in EPIC EKG - 04/02/2018 in EP[facility]  Stress Test - pt denies past 5-10 year ECHO - 10/29/2017 in Care Everywhere  Cardiac Cath - 09/2016 in EPIC  Sleep Study - pt denies  CPAP - n/a  Fasting Blood Sugar - n/a Checks Blood Sugar _____ times a day-n/a  Blood Thinner Instructions: n/a Aspirin Instructions: n/a  Anesthesia review: Yes-EKG, heart hx  Patient denies shortness of breath, fever, cough and chest pain at PAT appointment  Patient verbalized understanding of instructions that were given to them at the PAT appointment. Patient was also instructed that they will need to review over the PAT instructions again at home before surgery.

## 2018-04-15 NOTE — Pre-Procedure Instructions (Signed)
Madison Jennings  04/15/2018      [facility] - [place], [place] - [address] [place] [address] Phone: [phone] Fax: [phone]  [place], [facility] [address] [place] [address] [place] [address] Phone: [phone] Fax: [phone]    Your procedure is scheduled on April 24, 2018.  Report to [facility] Admitting at 130 PM.  Call this number if you have problems the morning of surgery:  [phone]   Remember:  Do not eat or drink after midnight.    Take these medicines the morning of surgery with A SIP OF WATER  Cyclobenzaprine (flexeril) Gabapentin (neurontin) Levothyroxine (synthroid) Oxycodone Pantoprazole (protonix)  7 days prior to surgery STOP taking any diclofenac (voltaren) gel,  Aspirin (unless otherwise instructed by your surgeon), Aleve, Naproxen, Ibuprofen, Motrin, Advil, Goody's, BC's, all herbal medications, fish oil, and all vitamins     Do not wear jewelry, make-up or nail polish.  Do not wear lotions, powders, or perfumes, or deodorant.  Do not shave 48 hours prior to surgery.    Do not bring valuables to the hospital.  [facility] is not responsible for any belongings or valuables.  Contacts, dentures or bridgework may not be worn into surgery.  Leave your suitcase in the car.  After surgery it may be brought to your room.  For patients admitted to the hospital, discharge time will be determined by your treatment team.  Patients discharged the day of surgery will not be allowed to drive home.    [facility]- Preparing For Surgery  Before surgery, you can play an important role. Because skin is not sterile, your skin needs to be as free of germs as possible. You can reduce the number of germs on your skin by washing with CHG (chlorahexidine gluconate) Soap before surgery.  CHG is an antiseptic cleaner which kills germs and bonds with the skin to  continue killing germs even after washing.    Oral Hygiene is also important to reduce your risk of infection.  Remember - BRUSH YOUR TEETH THE MORNING OF SURGERY WITH YOUR REGULAR TOOTHPASTE  Please do not use if you have an allergy to CHG or antibacterial soaps. If your skin becomes reddened/irritated stop using the CHG.  Do not shave (including legs and underarms) for at least 48 hours prior to first CHG shower. It is OK to shave your face.  Please follow these instructions carefully.   1. Shower the NIGHT BEFORE SURGERY and the MORNING OF SURGERY with CHG.   2. If you chose to wash your hair, wash your hair first as usual with your normal shampoo.  3. After you shampoo, rinse your hair and body thoroughly to remove the shampoo.  4. Use CHG as you would any other liquid soap. You can apply CHG directly to the skin and wash gently with a scrungie or a clean washcloth.   5. Apply the CHG Soap to your body ONLY FROM THE NECK DOWN.  Do not use on open wounds or open sores. Avoid contact with your eyes, ears, mouth and genitals (private parts). Wash Face and genitals (private parts)  with your normal soap.  6. Wash thoroughly, paying special attention to the area where your surgery will be performed.  7. Thoroughly rinse your body with warm water from the neck down.  8. DO NOT shower/wash with your normal soap after using and rinsing  off the CHG Soap.  9. Pat yourself dry with a CLEAN TOWEL.  10. Wear CLEAN PAJAMAS to bed the night before surgery, wear comfortable clothes the morning of surgery  11. Place CLEAN SHEETS on your bed the night of your first shower and DO NOT SLEEP WITH PETS.  Day of Surgery:  Do not apply any deodorants/lotions.  Please wear clean clothes to the hospital/surgery center.   Remember to brush your teeth WITH YOUR REGULAR TOOTHPASTE.  Please read over the following fact sheets that you were given.

## 2018-04-16 ENCOUNTER — Encounter (HOSPITAL_COMMUNITY): Payer: Self-pay

## 2018-04-16 ENCOUNTER — Encounter (HOSPITAL_COMMUNITY)
Admission: RE | Admit: 2018-04-16 | Discharge: 2018-04-16 | Disposition: A | Discharge status: Home / Self Care | Payer: Medicare Other | Source: Ambulatory Visit | Attending: Neurological Surgery | Admitting: Neurological Surgery

## 2018-04-16 ENCOUNTER — Other Ambulatory Visit: Payer: Self-pay

## 2018-04-16 DIAGNOSIS — Z01812 Encounter for preprocedural laboratory examination: Secondary | ICD-10-CM | POA: Insufficient documentation

## 2018-04-16 LAB — BASIC METABOLIC PANEL
Anion gap: 12 (ref 5–15)
BUN: 15 mg/dL (ref 8–23)
CO2: 21 mmol/L — ABNORMAL LOW (ref 22–32)
Calcium: 9.9 mg/dL (ref 8.9–10.3)
Chloride: 103 mmol/L (ref 98–111)
Creatinine, Ser: 0.96 mg/dL (ref 0.44–1.00)
GFR calc Af Amer: >60 mL/min (ref >60)
GFR calc non Af Amer: 57 mL/min — ABNORMAL LOW (ref >60)
Glucose, Bld: 108 mg/dL — ABNORMAL HIGH (ref 70–99)
POTASSIUM: 4.4 mmol/L (ref 3.5–5.1)
Sodium: 136 mmol/L (ref 135–145)

## 2018-04-16 LAB — SURGICAL PCR SCREEN
MRSA, PCR: NEGATIVE
Staphylococcus aureus: NEGATIVE

## 2018-04-16 LAB — CBC
HCT: 43.5 % (ref 36.0–46.0)
HEMOGLOBIN: 14 g/dL (ref 12.0–15.0)
MCH: 30.3 pg (ref 26.0–34.0)
MCHC: 32.2 g/dL (ref 30.0–36.0)
MCV: 94.2 fL (ref 80.0–100.0)
Platelets: 249 10*3/uL (ref 150–400)
RBC: 4.62 MIL/uL (ref 3.87–5.11)
RDW: 13.7 % (ref 11.5–15.5)
WBC: 7.2 10*3/uL (ref 4.0–10.5)
nRBC: 0 % (ref 0.0–0.2)

## 2018-04-17 NOTE — Progress Notes (Signed)
Anesthesia Chart Review:  Case:  [id number] Date/Time:  04/24/18 1516   Procedures:      Left T10-11 Microdiscectomy (Left ) - Left T10-11 Microdiscectomy     LUMBAR SPINAL CORD STIMULATOR REMOVAL (N/A ) - LUMBAR SPINAL CORD STIMULATOR REMOVAL   Anesthesia type:  General   Pre-op diagnosis:  Herniated nucleus pulposus, Thoracic region   Location:  [facility] OR ROOM 21 / [place] OR   Surgeon:  Kristeen Miss, MD      DISCUSSION: Patient is a 78 year old female scheduled for the above procedure.  History includes former smoker (quit '88), post-ablative hypothyroidism, GERD, HTN, HLD, fibromyalgia, asthma, diastolic dysfunction, ischemic colitis, palpitations, spinal cord stimulator 07/16/13. Essentially normal RHC/LHC in 2018. Normal stress test in 2019.  - ED evaluation 04/01/18 for chest pain. EKG without ST/T wave abnormality. Troponin 0.01-0.00. Symptoms resolved in ED. Given results and prior negative work-up, she was discharged home.   She was evaluated by cardiologist Dr. Clayborn Bigness last on 11/06/17 for follow-up evaluation bradycardia (as low as 36 bpm on Holter, average 58 bpm). Myoview showed no significant ischemia, and she was able to generate a maximum heart rate of 141 suggesting that she did not have chronotropic incompetence.  Echocardiogram was also unremarkable.  Patient did report symptoms of generalized weakness and fatigue but otherwise felt relatively asymptomatic and without clear indication for permanent pacemaker at that point.  Referral to EP cardiology initiated, although does not appear to have happened yet.   Patient with recent ischemic evaluation. 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 Testing did not suggest chronotropic incompetence. She has not seen EP cardiology. HR 56 bpm at PAT. Discussed with anesthesiologist Suella Broad, MD. If patient remains relatively asymptomatic from a bradycardia standpoint and without other acute  changes then it is anticipated that she can proceed as planned.   VS: BP (!) 144/63   Pulse (!) 56   Temp 37 C (Oral)   Resp 18   Ht 5\' 5"  (1.651 m)   Wt 94.8 kg   SpO2 97%   BMI 34.78 kg/m    PROVIDERS: Cletis Athens, MD is PCP Lucilla Lame, MD is endocrinologist [facility]ef[facility] Bryant, [place]). Lujean Amel, MD is cardiologist [facility]ef[facility] Bryant, [place]).   LABS: Labs reviewed: Acceptable for surgery. (all labs ordered are listed, but only abnormal results are displayed)  Labs Reviewed  BASIC METABOLIC PANEL - Abnormal; Notable for the following components:      Result Value   CO2 21 (*)    Glucose, Bld 108 (*)    GFR calc non Af Amer 57 (*)    All other components within normal limits  SURGICAL PCR SCREEN  CBC    IMAGES: CXR 04/01/18: IMPRESSION: No acute disease. Atherosclerosis.   CT C/T/L Spine 03/31/18: IMPRESSION: Cervical region: Degenerative spondylosis from C3-4 through C6-7. No compressive central canal stenosis. Osteophytic encroachment could potentially cause neural compression on the right at C5-6. Facet arthropathy on the right at C7-T1 with foraminal encroachment that could possibly affect the right C8 nerve. Foraminal narrowing at other levels appears mild to moderate at most without likely neural compression. Thoracic region: Neuro stimulators appear well positioned. Advanced degenerative spondylosis at T9-10 with sclerotic bone changes. Mild narrowing of the lateral recesses. Foraminal narrowing that could be symptomatic. T10-11 left posterolateral to foraminal disc herniation which could cause left-sided neural compression. Lumbar region: Solid fusion from L2 to the sacrum with wide  patency of the canal and foramina. Arachnoiditis pattern is present. Adjacent segment degenerative changes at L1-2 with degenerative spondylosis showing disc narrowing, vacuum phenomenon and chronic endplate changes. Bulging of the disc and facet  osteoarthritis. Lateral recess narrowing on the right which could possibly cause neural compression. The findings at this level could be associated with lumbago.   EKG: 04/01/18: SR, low voltage, precordial leads. Baseline wanderer in leads V2, V6.   CV: Nuclear stress test 10/31/17:  Blood pressure demonstrated a normal response to exercise.  There was no ST segment deviation noted during stress.  The study is normal.  This is a low risk study.  The left ventricular ejection fraction is normal (55-65%).  Echo 10/29/17 ([place]): INTERPRETATION NORMAL LEFT VENTRICULAR SYSTOLIC FUNCTION  WITH MODERATE LVH NORMAL RIGHT VENTRICULAR SYSTOLIC FUNCTION MILD VALVULAR REGURGITATION (TRIVIAL AR, MILD MR/TR/PR) NO VALVULAR STENOSIS EF 50%  48 Hour Holter monitor 10/02/17-10/04/17 ([place]): Hookup date October 02, 2017 Total beats 164,030 Minimum rate 36 maximum 121 average 58 Mostly sinus rhythm Rare PVCs Frequent PACs Short runs of atrial beats No pauses No high-grade blocks No ST segment changes No diary Conclusion Bradycardia otherwise benign Holter   RHC/LHC 10/22/16:  The left ventricular systolic function is normal.  LV end diastolic pressure is normal.  The left ventricular ejection fraction is 55-65% by visual estimate.  There is no mitral valve regurgitation.  There is no tricuspid valve regurgitation.  LV end diastolic pressure is normal. Conclusion Normal pulmonary pressures Normal overall left ventricular function Normal coronaries Basically normal cath  Carotid US 09/24/04: IMPRESSION:  Intimal thickening bilaterally, but no significant or flow limiting stenosis.   Past Medical History:  Diagnosis Date  . Arthritis   . Asthma   . Chronic back pain   . Chronic back pain    scoliosis and 2 buldging disc  . Depression   . Diastolic dysfunction   . Diverticulosis   . Dysrhythmia    PALPITATIONS  . Fibromyalgia   .  Gastritis   . GERD (gastroesophageal reflux disease)    takes Protonix daily  . Headache(784.0)    sinus related  . Hyperlipidemia    just diagnosed;will follow up in 6wks to discuss meds  . Hypertension    takes Lisinopril daily  . Hypothyroidism    takes Synthroid daily  . Ischemic colitis ([place])   . Joint pain   . Nocturia   . Obesity   . Vitamin D deficiency    just placed on Vit D this week    Past Surgical History:  Procedure Laterality Date  . ABDOMINAL HYSTERECTOMY    . APPENDECTOMY    . BACK SURGERY     x 3  . CARDIAC CATHETERIZATION  04/13/2010   normal cororanies, EF 55% ([facility]; Dr. Clayborn Bigness)  . CHOLECYSTECTOMY    . COLONOSCOPY    . COLONOSCOPY WITH PROPOFOL N/A 01/21/2017   Procedure: COLONOSCOPY WITH PROPOFOL;  Surgeon: Lollie Sails, MD;  Location: [facility] ENDOSCOPY;  Service: Endoscopy;  Laterality: N/A;  . CORONARY ANGIOPLASTY    . ESOPHAGOGASTRODUODENOSCOPY  06/12/2011  . ESOPHAGOGASTRODUODENOSCOPY (EGD) WITH PROPOFOL N/A 01/21/2017   Procedure: ESOPHAGOGASTRODUODENOSCOPY (EGD) WITH PROPOFOL;  Surgeon: Lollie Sails, MD;  Location: [facility] ENDOSCOPY;  Service: Endoscopy;  Laterality: N/A;  . FOOT SURGERY Left    d/t neuroma  . OOPHORECTOMY    . radiation to thyroid    . RIGHT/LEFT HEART CATH AND CORONARY ANGIOGRAPHY Bilateral 10/22/2016   Procedure: RIGHT/LEFT  HEART CATH AND CORONARY ANGIOGRAPHY;  Surgeon: Yolonda Kida, MD;  Location: [place] CV LAB;  Service: Cardiovascular;  Laterality: Bilateral;  . SPINAL CORD STIMULATOR INSERTION N/A 07/16/2013   Procedure: LUMBAR SPINAL CORD STIMULATOR INSERTION;  Surgeon: Bonna Gains, MD;  Location: [place];  Service: Neurosurgery;  Laterality: N/A;    MEDICATIONS: . cyclobenzaprine (FLEXERIL) 5 MG tablet  . diclofenac sodium (VOLTAREN) 1 % GEL  . gabapentin (NEURONTIN) 300 MG capsule  . levothyroxine (SYNTHROID, LEVOTHROID) 75 MCG tablet  . lisinopril (PRINIVIL,ZESTRIL) 40 MG tablet  . Oxycodone  HCl 10 MG TABS  . pantoprazole (PROTONIX) 40 MG tablet   No current facility-administered medications for this encounter.    . ondansetron (ZOFRAN) 4 mg in sodium chloride 0.9 % 50 mL IVPB    Myra Gianotti, PA-C Surgical Short Stay/Anesthesiology [facility] Phone [phone] [facility] Phone [phone] 04/17/2018 4:24 PM

## 2018-04-17 NOTE — Anesthesia Preprocedure Evaluation (Addendum)
Anesthesia Evaluation  Patient identified by MRN, date of birth, ID band Patient awake    Reviewed: Allergy & Precautions, NPO status , Patient's Chart, lab work & pertinent test results  History of Anesthesia Complications Negative for: history of anesthetic complications  Airway Mallampati: II  TM Distance: >3 FB Neck ROM: Full    Dental  (+) Caps, Dental Advisory Given, Missing   Pulmonary former smoker (quit 1988),    breath sounds clear to auscultation       Cardiovascular hypertension, Pt. on medications (-) angina+ dysrhythmias (h/o bradycardia )  Rhythm:Regular Rate:Normal  '18 cath: Normal pulmonary pressures, Normal overall left ventricular function, Normal coronaries    Neuro/Psych  Headaches, Depression Chronic back pain: narcotics    GI/Hepatic GERD  Medicated and Controlled,(+)     substance abuse  ,   Endo/Other  Hypothyroidism   Renal/GU negative Renal ROS     Musculoskeletal  (+) Arthritis , narcotic dependent  Abdominal   Peds  Hematology negative hematology ROS (+)   Anesthesia Other Findings   Reproductive/Obstetrics                           Anesthesia Physical Anesthesia Plan  ASA: III  Anesthesia Plan: General   Post-op Pain Management:    Induction: Intravenous  PONV Risk Score and Plan: 4 or greater and Dexamethasone, Ondansetron and Treatment may vary due to age or medical condition  Airway Management Planned: Oral ETT  Additional Equipment:   Intra-op Plan:   Post-operative Plan: Extubation in OR  Informed Consent: I have reviewed the patients History and Physical, chart, labs and discussed the procedure including the risks, benefits and alternatives for the proposed anesthesia with the patient or authorized representative who has indicated his/her understanding and acceptance.     Dental advisory given  Plan Discussed with: CRNA and  Surgeon  Anesthesia Plan Comments: (PAT note written 04/17/2018 by Myra Gianotti, PA-C. )      Anesthesia Quick Evaluation

## 2018-04-20 DIAGNOSIS — G8921 Chronic pain due to trauma: Secondary | ICD-10-CM | POA: Diagnosis not present

## 2018-04-20 DIAGNOSIS — M961 Postlaminectomy syndrome, not elsewhere classified: Secondary | ICD-10-CM | POA: Diagnosis not present

## 2018-04-20 DIAGNOSIS — Z79899 Other long term (current) drug therapy: Secondary | ICD-10-CM | POA: Diagnosis not present

## 2018-04-20 DIAGNOSIS — M545 Low back pain: Secondary | ICD-10-CM | POA: Diagnosis not present

## 2018-04-20 DIAGNOSIS — G8929 Other chronic pain: Secondary | ICD-10-CM | POA: Diagnosis not present

## 2018-04-24 ENCOUNTER — Ambulatory Visit (HOSPITAL_COMMUNITY): Payer: Medicare Other | Admitting: Anesthesiology

## 2018-04-24 ENCOUNTER — Other Ambulatory Visit: Payer: Self-pay

## 2018-04-24 ENCOUNTER — Encounter (HOSPITAL_COMMUNITY): Payer: Self-pay

## 2018-04-24 ENCOUNTER — Ambulatory Visit (HOSPITAL_COMMUNITY): Payer: Medicare Other | Admitting: Vascular Surgery

## 2018-04-24 ENCOUNTER — Encounter (HOSPITAL_COMMUNITY)
Admission: AD | Disposition: A | Discharge status: Home Health | Payer: Self-pay | Source: Home / Self Care | Attending: Neurological Surgery

## 2018-04-24 ENCOUNTER — Inpatient Hospital Stay (HOSPITAL_COMMUNITY)
Admission: AD | Admit: 2018-04-24 | Discharge: 2018-04-26 | DRG: 519 | Disposition: A | Discharge status: Home Health | Payer: Medicare Other | Attending: Neurological Surgery | Admitting: Neurological Surgery

## 2018-04-24 ENCOUNTER — Ambulatory Visit (HOSPITAL_COMMUNITY): Payer: Medicare Other

## 2018-04-24 DIAGNOSIS — Z87891 Personal history of nicotine dependence: Secondary | ICD-10-CM

## 2018-04-24 DIAGNOSIS — Z419 Encounter for procedure for purposes other than remedying health state, unspecified: Secondary | ICD-10-CM

## 2018-04-24 DIAGNOSIS — M5104 Intervertebral disc disorders with myelopathy, thoracic region: Secondary | ICD-10-CM | POA: Diagnosis present

## 2018-04-24 DIAGNOSIS — G8929 Other chronic pain: Secondary | ICD-10-CM | POA: Diagnosis not present

## 2018-04-24 DIAGNOSIS — E559 Vitamin D deficiency, unspecified: Secondary | ICD-10-CM | POA: Diagnosis present

## 2018-04-24 DIAGNOSIS — E039 Hypothyroidism, unspecified: Secondary | ICD-10-CM | POA: Diagnosis not present

## 2018-04-24 DIAGNOSIS — Z79899 Other long term (current) drug therapy: Secondary | ICD-10-CM | POA: Diagnosis not present

## 2018-04-24 DIAGNOSIS — E669 Obesity, unspecified: Secondary | ICD-10-CM | POA: Diagnosis present

## 2018-04-24 DIAGNOSIS — Z923 Personal history of irradiation: Secondary | ICD-10-CM | POA: Diagnosis not present

## 2018-04-24 DIAGNOSIS — T85193A Other mechanical complication of implanted electronic neurostimulator, generator, initial encounter: Secondary | ICD-10-CM | POA: Diagnosis not present

## 2018-04-24 DIAGNOSIS — T85890A Other specified complication of nervous system prosthetic devices, implants and grafts, initial encounter: Secondary | ICD-10-CM | POA: Diagnosis not present

## 2018-04-24 DIAGNOSIS — I10 Essential (primary) hypertension: Secondary | ICD-10-CM | POA: Diagnosis not present

## 2018-04-24 DIAGNOSIS — Z79890 Hormone replacement therapy (postmenopausal): Secondary | ICD-10-CM

## 2018-04-24 DIAGNOSIS — M5124 Other intervertebral disc displacement, thoracic region: Secondary | ICD-10-CM | POA: Diagnosis present

## 2018-04-24 DIAGNOSIS — Z6837 Body mass index (BMI) 37.0-37.9, adult: Secondary | ICD-10-CM

## 2018-04-24 DIAGNOSIS — M5114 Intervertebral disc disorders with radiculopathy, thoracic region: Principal | ICD-10-CM | POA: Diagnosis present

## 2018-04-24 DIAGNOSIS — M797 Fibromyalgia: Secondary | ICD-10-CM | POA: Diagnosis present

## 2018-04-24 DIAGNOSIS — Z981 Arthrodesis status: Secondary | ICD-10-CM | POA: Diagnosis not present

## 2018-04-24 DIAGNOSIS — Y752 Prosthetic and other implants, materials and neurological devices associated with adverse incidents: Secondary | ICD-10-CM | POA: Diagnosis present

## 2018-04-24 DIAGNOSIS — Z90710 Acquired absence of both cervix and uterus: Secondary | ICD-10-CM

## 2018-04-24 DIAGNOSIS — E785 Hyperlipidemia, unspecified: Secondary | ICD-10-CM | POA: Diagnosis present

## 2018-04-24 DIAGNOSIS — Z90722 Acquired absence of ovaries, bilateral: Secondary | ICD-10-CM | POA: Diagnosis not present

## 2018-04-24 DIAGNOSIS — T85113A Breakdown (mechanical) of implanted electronic neurostimulator, generator, initial encounter: Secondary | ICD-10-CM | POA: Diagnosis not present

## 2018-04-24 DIAGNOSIS — F329 Major depressive disorder, single episode, unspecified: Secondary | ICD-10-CM | POA: Diagnosis present

## 2018-04-24 DIAGNOSIS — T85192A Other mechanical complication of implanted electronic neurostimulator (electrode) of spinal cord, initial encounter: Secondary | ICD-10-CM | POA: Diagnosis not present

## 2018-04-24 DIAGNOSIS — K219 Gastro-esophageal reflux disease without esophagitis: Secondary | ICD-10-CM | POA: Diagnosis not present

## 2018-04-24 DIAGNOSIS — Z9049 Acquired absence of other specified parts of digestive tract: Secondary | ICD-10-CM

## 2018-04-24 HISTORY — PX: THORACIC DISCECTOMY: SHX6113

## 2018-04-24 SURGERY — THORACIC DISCECTOMY
Anesthesia: General | Site: Back | Laterality: Left

## 2018-04-24 MED ORDER — THROMBIN 5000 UNITS EX SOLR
CUTANEOUS | Status: AC
  Filled 2018-04-24: qty 15000

## 2018-04-24 MED ORDER — HEMOSTATIC AGENTS (NO CHARGE) OPTIME
TOPICAL | Status: DC | PRN
  Administered 2018-04-24: 1 via TOPICAL

## 2018-04-24 MED ORDER — HYDROMORPHONE HCL 1 MG/ML IJ SOLN
INTRAMUSCULAR | Status: AC
  Administered 2018-04-24: 0.5 mg via INTRAVENOUS
  Filled 2018-04-24: qty 1

## 2018-04-24 MED ORDER — FENTANYL CITRATE (PF) 250 MCG/5ML IJ SOLN
INTRAMUSCULAR | Status: DC | PRN
  Administered 2018-04-24 (×4): 50 ug via INTRAVENOUS

## 2018-04-24 MED ORDER — PROMETHAZINE HCL 25 MG/ML IJ SOLN
INTRAMUSCULAR | Status: AC
  Filled 2018-04-24: qty 1

## 2018-04-24 MED ORDER — CHLORHEXIDINE GLUCONATE CLOTH 2 % EX PADS: 6.0000 | MEDICATED_PAD | Freq: Once | CUTANEOUS | Status: DC

## 2018-04-24 MED ORDER — HYDROMORPHONE HCL 1 MG/ML IJ SOLN
0.2500 mg | INTRAMUSCULAR | Status: DC | PRN
  Administered 2018-04-24 (×4): 0.5 mg via INTRAVENOUS

## 2018-04-24 MED ORDER — THROMBIN 5000 UNITS EX SOLR
CUTANEOUS | Status: DC | PRN
  Administered 2018-04-24: 5000 [IU] via TOPICAL

## 2018-04-24 MED ORDER — BUPIVACAINE HCL (PF) 0.5 % IJ SOLN
INTRAMUSCULAR | Status: AC
  Filled 2018-04-24: qty 30

## 2018-04-24 MED ORDER — PROPOFOL 10 MG/ML IV BOLUS
INTRAVENOUS | Status: DC | PRN
  Administered 2018-04-24: 100 mg via INTRAVENOUS

## 2018-04-24 MED ORDER — LIDOCAINE-EPINEPHRINE 1 %-1:100000 IJ SOLN
INTRAMUSCULAR | Status: DC | PRN
  Administered 2018-04-24: 5 mL via INTRADERMAL

## 2018-04-24 MED ORDER — SUGAMMADEX SODIUM 200 MG/2ML IV SOLN
INTRAVENOUS | Status: DC | PRN
  Administered 2018-04-24: 200 mg via INTRAVENOUS

## 2018-04-24 MED ORDER — GABAPENTIN 300 MG PO CAPS
300.0000 mg | ORAL_CAPSULE | Freq: Three times a day (TID) | ORAL | Status: DC
  Administered 2018-04-25 – 2018-04-26 (×5): 300 mg via ORAL
  Filled 2018-04-24 (×5): qty 1

## 2018-04-24 MED ORDER — CEFAZOLIN SODIUM-DEXTROSE 2-3 GM-%(50ML) IV SOLR
INTRAVENOUS | Status: DC | PRN
  Administered 2018-04-24: 2 g via INTRAVENOUS

## 2018-04-24 MED ORDER — THROMBIN 5000 UNITS EX SOLR
OROMUCOSAL | Status: DC | PRN
  Administered 2018-04-24: 5 mL via TOPICAL

## 2018-04-24 MED ORDER — 0.9 % SODIUM CHLORIDE (POUR BTL) OPTIME
TOPICAL | Status: DC | PRN
  Administered 2018-04-24: 1000 mL

## 2018-04-24 MED ORDER — ARTIFICIAL TEARS OPHTHALMIC OINT
TOPICAL_OINTMENT | OPHTHALMIC | Status: AC
  Filled 2018-04-24: qty 3.5

## 2018-04-24 MED ORDER — MEPERIDINE HCL 50 MG/ML IJ SOLN: 6.2500 mg | INTRAMUSCULAR | Status: DC | PRN

## 2018-04-24 MED ORDER — CEFAZOLIN SODIUM-DEXTROSE 2-4 GM/100ML-% IV SOLN: 2.0000 g | INTRAVENOUS | Status: DC

## 2018-04-24 MED ORDER — ROCURONIUM BROMIDE 100 MG/10ML IV SOLN
INTRAVENOUS | Status: DC | PRN
  Administered 2018-04-24: 50 mg via INTRAVENOUS

## 2018-04-24 MED ORDER — PROMETHAZINE HCL 25 MG/ML IJ SOLN: 6.2500 mg | INTRAMUSCULAR | Status: DC | PRN

## 2018-04-24 MED ORDER — SODIUM CHLORIDE 0.9 % IV SOLN
INTRAVENOUS | Status: DC | PRN
  Administered 2018-04-24: 500 mL

## 2018-04-24 MED ORDER — LIDOCAINE HCL (CARDIAC) PF 100 MG/5ML IV SOSY
PREFILLED_SYRINGE | INTRAVENOUS | Status: DC | PRN
  Administered 2018-04-24: 60 mg via INTRATRACHEAL

## 2018-04-24 MED ORDER — LIDOCAINE-EPINEPHRINE 1 %-1:100000 IJ SOLN
INTRAMUSCULAR | Status: AC
  Filled 2018-04-24: qty 1

## 2018-04-24 MED ORDER — BUPIVACAINE HCL (PF) 0.5 % IJ SOLN
INTRAMUSCULAR | Status: DC | PRN
  Administered 2018-04-24: 5 mL
  Administered 2018-04-24: 10 mL

## 2018-04-24 MED ORDER — CEFAZOLIN SODIUM-DEXTROSE 2-4 GM/100ML-% IV SOLN
INTRAVENOUS | Status: AC
  Filled 2018-04-24: qty 100

## 2018-04-24 MED ORDER — KETOROLAC TROMETHAMINE 15 MG/ML IJ SOLN
INTRAMUSCULAR | Status: AC
  Filled 2018-04-24: qty 1

## 2018-04-24 MED ORDER — MIDAZOLAM HCL 2 MG/2ML IJ SOLN: 0.5000 mg | Freq: Once | INTRAMUSCULAR | Status: DC | PRN

## 2018-04-24 MED ORDER — ROCURONIUM BROMIDE 50 MG/5ML IV SOSY
PREFILLED_SYRINGE | INTRAVENOUS | Status: AC
  Filled 2018-04-24: qty 5

## 2018-04-24 MED ORDER — FENTANYL CITRATE (PF) 250 MCG/5ML IJ SOLN
INTRAMUSCULAR | Status: AC
  Filled 2018-04-24: qty 5

## 2018-04-24 MED ORDER — EPHEDRINE SULFATE 50 MG/ML IJ SOLN
INTRAMUSCULAR | Status: DC | PRN
  Administered 2018-04-24: 10 mg via INTRAVENOUS
  Administered 2018-04-24: 5 mg via INTRAVENOUS

## 2018-04-24 MED ORDER — METHOCARBAMOL 1000 MG/10ML IJ SOLN: 500.0000 mg | Freq: Four times a day (QID) | INTRAVENOUS | Status: DC | PRN

## 2018-04-24 MED ORDER — LIDOCAINE 2% (20 MG/ML) 5 ML SYRINGE
INTRAMUSCULAR | Status: AC
  Filled 2018-04-24: qty 5

## 2018-04-24 MED ORDER — CYCLOBENZAPRINE HCL 5 MG PO TABS: 5.0000 mg | ORAL_TABLET | Freq: Three times a day (TID) | ORAL | Status: DC | PRN

## 2018-04-24 MED ORDER — KETOROLAC TROMETHAMINE 15 MG/ML IJ SOLN: 7.5000 mg | Freq: Four times a day (QID) | INTRAMUSCULAR | Status: DC

## 2018-04-24 MED ORDER — ONDANSETRON HCL 4 MG/2ML IJ SOLN
INTRAMUSCULAR | Status: DC | PRN
  Administered 2018-04-24: 4 mg via INTRAVENOUS

## 2018-04-24 MED ORDER — LACTATED RINGERS IV SOLN: INTRAVENOUS | Status: DC

## 2018-04-24 MED ORDER — LACTATED RINGERS IV SOLN
INTRAVENOUS | Status: DC | PRN
  Administered 2018-04-24 (×2): via INTRAVENOUS

## 2018-04-24 MED ORDER — THROMBIN 20000 UNITS EX SOLR
CUTANEOUS | Status: AC
  Filled 2018-04-24: qty 20000

## 2018-04-24 MED ORDER — THROMBIN 5000 UNITS EX SOLR
CUTANEOUS | Status: AC
  Filled 2018-04-24: qty 5000

## 2018-04-24 MED ORDER — METHOCARBAMOL 500 MG PO TABS
ORAL_TABLET | ORAL | Status: AC
  Filled 2018-04-24: qty 1

## 2018-04-24 MED ORDER — METHOCARBAMOL 500 MG PO TABS
500.0000 mg | ORAL_TABLET | Freq: Four times a day (QID) | ORAL | Status: DC | PRN
  Administered 2018-04-24 – 2018-04-25 (×2): 500 mg via ORAL
  Filled 2018-04-24: qty 1

## 2018-04-24 MED ORDER — PROPOFOL 10 MG/ML IV BOLUS
INTRAVENOUS | Status: AC
  Filled 2018-04-24: qty 20

## 2018-04-24 MED ORDER — EPHEDRINE 5 MG/ML INJ
INTRAVENOUS | Status: AC
  Filled 2018-04-24: qty 10

## 2018-04-24 SURGICAL SUPPLY — 67 items
ADH SKN CLS APL DERMABOND .7 (GAUZE/BANDAGES/DRESSINGS) ×4
APL SKNCLS STERI-STRIP NONHPOA (GAUZE/BANDAGES/DRESSINGS)
BAG DECANTER FOR FLEXI CONT (MISCELLANEOUS) ×2 IMPLANT
BENZOIN TINCTURE PRP APPL 2/3 (GAUZE/BANDAGES/DRESSINGS) IMPLANT
BLADE 11 SAFETY STRL DISP (BLADE) ×3 IMPLANT
BLADE CLIPPER SURG (BLADE) IMPLANT
BUR ACORN 6.0 (BURR) IMPLANT
BUR ACORN 6.0MM (BURR)
BUR MATCHSTICK NEURO 3.0 LAGG (BURR) ×4 IMPLANT
CANISTER SUCT 3000ML PPV (MISCELLANEOUS) ×4 IMPLANT
CARTRIDGE OIL MAESTRO DRILL (MISCELLANEOUS) ×2 IMPLANT
CLOSURE WOUND 1/2 X4 (GAUZE/BANDAGES/DRESSINGS)
COVER WAND RF STERILE (DRAPES) ×5 IMPLANT
DECANTER SPIKE VIAL GLASS SM (MISCELLANEOUS) ×8 IMPLANT
DERMABOND ADVANCED (GAUZE/BANDAGES/DRESSINGS) ×4
DERMABOND ADVANCED .7 DNX12 (GAUZE/BANDAGES/DRESSINGS) ×3 IMPLANT
DEVICE DISSECT PLASMABLAD 3.0S (MISCELLANEOUS) ×1 IMPLANT
DIFFUSER DRILL AIR PNEUMATIC (MISCELLANEOUS) ×2 IMPLANT
DRAPE HALF SHEET 40X57 (DRAPES) IMPLANT
DRAPE LAPAROTOMY 100X72X124 (DRAPES) ×5 IMPLANT
DRAPE MICROSCOPE LEICA (MISCELLANEOUS) ×3 IMPLANT
DRAPE POUCH INSTRU U-SHP 10X18 (DRAPES) ×2 IMPLANT
DURAPREP 26ML APPLICATOR (WOUND CARE) ×8 IMPLANT
ELECT REM PT RETURN 9FT ADLT (ELECTROSURGICAL) ×4
ELECTRODE REM PT RTRN 9FT ADLT (ELECTROSURGICAL) ×3 IMPLANT
GAUZE 4X4 16PLY RFD (DISPOSABLE) IMPLANT
GAUZE SPONGE 4X4 12PLY STRL (GAUZE/BANDAGES/DRESSINGS) ×4 IMPLANT
GLOVE BIO SURGEON STRL SZ7 (GLOVE) ×3 IMPLANT
GLOVE BIOGEL PI IND STRL 7.5 (GLOVE) ×1 IMPLANT
GLOVE BIOGEL PI IND STRL 8.5 (GLOVE) ×3 IMPLANT
GLOVE BIOGEL PI INDICATOR 7.5 (GLOVE) ×2
GLOVE BIOGEL PI INDICATOR 8.5 (GLOVE) ×2
GLOVE ECLIPSE 8.5 STRL (GLOVE) ×5 IMPLANT
GLOVE EXAM NITRILE XL STR (GLOVE) IMPLANT
GOWN STRL REUS W/ TWL LRG LVL3 (GOWN DISPOSABLE) IMPLANT
GOWN STRL REUS W/ TWL XL LVL3 (GOWN DISPOSABLE) ×2 IMPLANT
GOWN STRL REUS W/TWL 2XL LVL3 (GOWN DISPOSABLE) ×5 IMPLANT
GOWN STRL REUS W/TWL LRG LVL3 (GOWN DISPOSABLE)
GOWN STRL REUS W/TWL XL LVL3 (GOWN DISPOSABLE) ×4
KIT BASIN OR (CUSTOM PROCEDURE TRAY) ×5 IMPLANT
KIT NEUROSTIMULATOR ACCESSORY (KITS) ×3 IMPLANT
KIT TURNOVER KIT B (KITS) ×8 IMPLANT
NDL SPNL 20GX3.5 QUINCKE YW (NEEDLE) IMPLANT
NEEDLE HYPO 22GX1.5 SAFETY (NEEDLE) ×5 IMPLANT
NEEDLE SPNL 20GX3.5 QUINCKE YW (NEEDLE) ×4 IMPLANT
NS IRRIG 1000ML POUR BTL (IV SOLUTION) ×5 IMPLANT
OIL CARTRIDGE MAESTRO DRILL (MISCELLANEOUS)
PACK LAMINECTOMY NEURO (CUSTOM PROCEDURE TRAY) ×5 IMPLANT
PAD ARMBOARD 7.5X6 YLW CONV (MISCELLANEOUS) ×13 IMPLANT
PATTIES SURGICAL .5 X1 (DISPOSABLE) ×4 IMPLANT
PLASMABLADE 3.0S (MISCELLANEOUS) ×4
RUBBERBAND STERILE (MISCELLANEOUS) ×6 IMPLANT
SPONGE LAP 4X18 RFD (DISPOSABLE) IMPLANT
SPONGE SURGIFOAM ABS GEL SZ50 (HEMOSTASIS) ×8 IMPLANT
STAPLER SKIN PROX WIDE 3.9 (STAPLE) IMPLANT
STRIP CLOSURE SKIN 1/2X4 (GAUZE/BANDAGES/DRESSINGS) IMPLANT
SUT SILK 0 TIES 10X30 (SUTURE) IMPLANT
SUT VIC AB 0 CT1 18XCR BRD8 (SUTURE) ×1 IMPLANT
SUT VIC AB 0 CT1 8-18 (SUTURE)
SUT VIC AB 1 CT1 18XBRD ANBCTR (SUTURE) ×2 IMPLANT
SUT VIC AB 1 CT1 8-18 (SUTURE) ×4
SUT VIC AB 2-0 CP2 18 (SUTURE) ×5 IMPLANT
SUT VIC AB 3-0 SH 8-18 (SUTURE) ×5 IMPLANT
SYR CONTROL 10ML LL (SYRINGE) ×1 IMPLANT
TOWEL GREEN STERILE (TOWEL DISPOSABLE) ×5 IMPLANT
TOWEL GREEN STERILE FF (TOWEL DISPOSABLE) ×5 IMPLANT
WATER STERILE IRR 1000ML POUR (IV SOLUTION) ×5 IMPLANT

## 2018-04-24 NOTE — H&P (Signed)
Madison Jennings is an 78 y.o. female.   Chief Complaint: Back pain and leg weakness with herniated nucleus pulposus T10-T11, nonfunctioning spinal cord stimulator HPI: Madison Jennings is a 78 year old individual who has had a spinal cord stimulator placed in the lumbar spine for low back pain.  She used this initially for the first couple of years but then has found it to be of no help and she has not had it on for a couple of years time.  She believes the battery is dead and will not even hold a charge at this point.  She has had increasingly worsening back pain and dysesthesias in her lower extremities and she notes that she has pain at the thoracolumbar junction.  Recent myelogram and post myelogram CAT scan demonstrates presence of a large disc herniation at T10-T11 causing cord compression.  I advised surgery for decompression of her spinal canal.  She is now admitted to undergo this procedure.  Also discussed with her removal of the spinal cord stimulator as this does not seem to be of any use to her and it is also causing some discomfort in her back where the battery pack is placed.  Past Medical History:  Diagnosis Date  . Arthritis   . Asthma   . Chronic back pain   . Chronic back pain    scoliosis and 2 buldging disc  . Depression   . Diastolic dysfunction   . Diverticulosis   . Dysrhythmia    PALPITATIONS  . Fibromyalgia   . Gastritis   . GERD (gastroesophageal reflux disease)    takes Protonix daily  . Headache(784.0)    sinus related  . Hyperlipidemia    just diagnosed;will follow up in 6wks to discuss meds  . Hypertension    takes Lisinopril daily  . Hypothyroidism    takes Synthroid daily  . Ischemic colitis ([place])   . Joint pain   . Nocturia   . Obesity   . Vitamin D deficiency    just placed on Vit D this week    Past Surgical History:  Procedure Laterality Date  . ABDOMINAL HYSTERECTOMY    . APPENDECTOMY    . BACK SURGERY     x 3  . CARDIAC CATHETERIZATION   04/13/2010   normal cororanies, EF 55% ([facility]; Dr. Clayborn Bigness)  . CHOLECYSTECTOMY    . COLONOSCOPY    . COLONOSCOPY WITH PROPOFOL N/A 01/21/2017   Procedure: COLONOSCOPY WITH PROPOFOL;  Surgeon: Lollie Sails, MD;  Location[facility] ENDOSCOPY;  Service: Endoscopy;  Laterality: N/A;  . CORONARY ANGIOPLASTY    . ESOPHAGOGASTRODUODENOSCOPY  06/12/2011  . ESOPHAGOGASTRODUODENOSCOPY (EGD) WITH PROPOFOL N/A 01/21/2017   Procedure: ESOPHAGOGASTRODUODENOSCOPY (EGD) WITH PROPOFOL;  Surgeon: Lollie Sails, MD;  Location: [facility] ENDOSCOPY;  Service: Endoscopy;  Laterality: N/A;  . FOOT SURGERY Left    d/t neuroma  . OOPHORECTOMY    . radiation to thyroid    . RIGHT/LEFT HEART CATH AND CORONARY ANGIOGRAPHY Bilateral 10/22/2016   Procedure: RIGHT/LEFT HEART CATH AND CORONARY ANGIOGRAPHY;  Surgeon: Yolonda Kida, MD;  Location: [place] CV LAB;  Service: Cardiovascular;  Laterality: Bilateral;  . SPINAL CORD STIMULATOR INSERTION N/A 07/16/2013   Procedure: LUMBAR SPINAL CORD STIMULATOR INSERTION;  Surgeon: Bonna Gains, MD;  Location: [facility];  Service: Neurosurgery;  Laterality: N/A;    Family History  Problem Relation Age of Onset  . Breast cancer Neg Hx    Social History:  reports that she quit smoking about  31 years ago. Her smoking use included cigarettes. She smoked 0.50 packs per day for 0.00 years. She has never used smokeless tobacco. She reports that she does not drink alcohol or use drugs.  Allergies: No Known Allergies  Medications Prior to Admission  Medication Sig Dispense Refill  . gabapentin (NEURONTIN) 300 MG capsule Take 300 mg by mouth 3 (three) times daily.    Marland Kitchen levothyroxine (SYNTHROID, LEVOTHROID) 75 MCG tablet Take 75 mcg by mouth daily before breakfast.    . lisinopril (PRINIVIL,ZESTRIL) 40 MG tablet Take 40 mg by mouth daily.    . Oxycodone HCl 10 MG TABS Take 10 mg by mouth every 8 (eight) hours.     . pantoprazole (PROTONIX) 40 MG tablet Take 40 mg by mouth  daily.    . cyclobenzaprine (FLEXERIL) 5 MG tablet Take 1 tablet (5 mg total) by mouth 3 (three) times daily as needed for muscle spasms. 15 tablet 0  . diclofenac sodium (VOLTAREN) 1 % GEL Apply 2 g topically 4 (four) times daily. (Patient taking differently: Apply 2 g topically 4 (four) times daily as needed (pain). ) 100 g 0    No results found for this or any previous visit (from the past 48 hour(s)). No results found.  Review of Systems  Constitutional: Negative.   HENT: Negative.   Eyes: Negative.   Respiratory: Negative.   Cardiovascular: Negative.   Gastrointestinal: Negative.   Genitourinary: Negative.   Musculoskeletal: Positive for back pain.  Skin: Negative.   Neurological: Positive for tingling, sensory change and focal weakness.  Endo/Heme/Allergies: Negative.   Psychiatric/Behavioral: Negative.     Blood pressure (!) 115/52, pulse 63, temperature 98.3 F (36.8 C), temperature source Oral, resp. rate 18, height 5\' 5"  (1.651 m), weight 94.8 kg, SpO2 96 %. Physical Exam  Constitutional: She is oriented to person, place, and time. She appears well-developed and well-nourished.  HENT:  Head: Normocephalic and atraumatic.  Eyes: Pupils are equal, round, and reactive to light. Conjunctivae and EOM are normal.  Neck: Normal range of motion. Neck supple.  Cardiovascular: Normal rate and regular rhythm.  Respiratory: Effort normal and breath sounds normal.  GI: Soft. Bowel sounds are normal.  Musculoskeletal: Normal range of motion.  Neurological: She is alert and oriented to person, place, and time.  Bilateral hyperreflexia in the patellae and the Achilles.  Upgoing Babinski's bilaterally.  Moderate spasticity and gait.  Upper extremity strength and reflexes are normal.  Cranial nerve examination is normal.  Skin: Skin is warm and dry.  Psychiatric: She has a normal mood and affect. Her behavior is normal. Judgment and thought content normal.      Assessment/Plan Herniated nucleus pulposus T10-T11.  Thoracic myelopathy.  Nonfunctioning spinal cord stimulator.  Plan: Removal of spinal cord stimulator and laminotomy discectomy T10-T11.  Earleen Newport, MD 04/24/2018, 8:21 PM

## 2018-04-24 NOTE — Progress Notes (Signed)
Patient's procedure time is now scheduled for 2122 and patient aware to arrive at 1800.  Per Dr. Gifford Shave, patient can have a sandwich to eat at 1100 and fluids until 1100.  Patient instructed not to have anything after 1100 and she verbalized understanding.

## 2018-04-24 NOTE — Progress Notes (Signed)
Patient ID: Madison Jennings, female   DOB: [birth year], 78 y.o.   MRN: [id number] Vital signs are stable and patient is awake and alert.  She feels better in her lower extremities but notes that she has moderate amount of back pain.  I advised that she can be up and ambulating.  If she feels well enough she can be discharged in the morning however if she needs to stay another day or so this is not unreasonable given the late hour of her surgery.  Herniated nucleus pulposus at T10-T11 has been resected and spinal cord stimulator has been removed.

## 2018-04-24 NOTE — Progress Notes (Signed)
Attempt to call report nurse unavailable

## 2018-04-24 NOTE — Transfer of Care (Signed)
Immediate Anesthesia Transfer of Care Note  Patient: Madison Jennings  Procedure(s) Performed: Left Thoracic ten- thoracic eleven Microdiscectomy, Removal of Spinal Cord Stimulator (Left Back)  Patient Location: PACU  Anesthesia Type:General  Level of Consciousness: drowsy  Airway & Oxygen Therapy: Patient Spontanous Breathing and Patient connected to face mask oxygen  Post-op Assessment: Report given to RN and Post -op Vital signs reviewed and stable  Post vital signs: Reviewed and stable  Last Vitals:  Vitals Value Taken Time  BP 158/67 04/24/2018 10:31 PM  Temp    Pulse 102 04/24/2018 10:32 PM  Resp 21 04/24/2018 10:32 PM  SpO2 99 % 04/24/2018 10:32 PM  Vitals shown include unvalidated device data.  Last Pain:  Vitals:   04/24/18 1830  TempSrc:   PainSc: 5          Complications: No apparent anesthesia complications

## 2018-04-24 NOTE — Op Note (Signed)
Date of surgery: 04/24/2018 Preoperative diagnosis: Herniated nucleus pulposus T10-T11 on the left with thoracic radiculopathy, myelopathy.  Expired nonfunctioning spinal cord stimulator Postoperative diagnosis: Same Procedure: T10-T11 left laminotomy and microdiscectomy with operating microscope microdissection technique.  Removal of expired spinal cord stimulator leads and generator Surgeon: Kristeen Miss Anesthesia: General endotracheal Indications: Madison Jennings is a 78 year old individuals had significant pain in the thoracic spine on the left side primarily at the thoracolumbar junction.  She has evidence of a large herniated nucleus pulposus at the T10-T11 level on the left side she is advised regarding surgical excision of this process and she is admitted now for that in addition she has a spinal cord stimulator that she has not been using for a few years and this is being removed also.  Procedure: Patient was brought to the operating room supine on the stretcher after the smooth induction of general endotracheal anesthesia, she was turned prone.  The back was prepped with alcohol DuraPrep and draped in a sterile fashion.  T10-T11 was localized with several radiographs using needle localization to identify the region of the disc space.  Then a incision was made in the midline and carried down to the lumbodorsal fascia on the left side.  Self-retaining retractor was placed in the wound.  Laminotomy and foraminotomies was then created over the interspace at T10-T11 on the left side and high-speed drill was used to remove most of the bone followed by 2 mm Kerrison punch lateral aspect of the common dural tube was identified as was the T10 nerve root.  Then by working on the inferior aspect of the nerve root the disc space was identified and herniated disc material was encountered 15 blade was used to create a small annulotomy and the disc space was explored and a substantial quantity of severely degenerated  disc material was removed from the interspace at the T10-T11 level.  This was in the subligamentous space.  The space itself could not be entered as it was rather collapsed.  Once a decompression centrally and then laterally along the T10 nerve root was completed hemostasis was achieved in this area this portion of the surgery was done with the use of the operating microscope and microdissection technique from the performance of the laminotomy through the discectomy.  The microscope was then removed and the wound then was closed with 2-0 Vicryl in the thoracodorsal fascia and 3-0 Vicryl subcuticularly prior to closure though 2 other incisions were created 1 over the spinal cord stimulator generator in the region of the left supra gluteal area the previous incision to insert this was reopened and dissection was carried down to the device itself the device was unwound and the wires were separated from the surrounding fascial tissue.  4 leads coming out of the generator were wide down to 2 leads and the connector here was opened to release the 2 leads of the spinal cord stimulator in the thoracic spine.  Then a separate incision was made in the midline and the tiedown's for the spinal cord stimulator leads were released from the soft tissues.  The leads were able to be withdrawn from the thoracic epidurals base without difficulty.  Once all the hardware was removed through these 2 incisions hemostasis was checked in the incisions and then the deeper fascia was closed with 2-0 Vicryl in interrupted fashion 3-0 Vicryl was used subcuticularly blood loss for the entire procedure was estimated at less than 50 cc.  Patient was then returned to recovery room  in stable condition.

## 2018-04-24 NOTE — Anesthesia Procedure Notes (Signed)
Procedure Name: Intubation Date/Time: 04/24/2018 9:11 PM Performed by: Clovis Cao, CRNA Pre-anesthesia Checklist: Patient identified, Emergency Drugs available, Suction available, Patient being monitored and Timeout performed Patient Re-evaluated:Patient Re-evaluated prior to induction Oxygen Delivery Method: Circle system utilized Preoxygenation: Pre-oxygenation with 100% oxygen Induction Type: IV induction Ventilation: Mask ventilation without difficulty Laryngoscope Size: Miller and 2 Grade View: Grade I Tube type: Oral Tube size: 7.0 mm Number of attempts: 2 (DL x 1 by CRNA- esophageal intubation.  Mask resumed. Second DL by CRNA with grade 1 view- easy placement of ETT.) Airway Equipment and Method: Stylet Placement Confirmation: ETT inserted through vocal cords under direct vision,  positive ETCO2 and breath sounds checked- equal and bilateral Secured at: 21 cm Tube secured with: Tape Dental Injury: Teeth and Oropharynx as per pre-operative assessment

## 2018-04-25 MED ORDER — CEFAZOLIN SODIUM-DEXTROSE 2-4 GM/100ML-% IV SOLN
2.0000 g | Freq: Three times a day (TID) | INTRAVENOUS | Status: AC
  Administered 2018-04-25 (×2): 2 g via INTRAVENOUS
  Filled 2018-04-25 (×2): qty 100

## 2018-04-25 MED ORDER — LISINOPRIL 20 MG PO TABS
40.0000 mg | ORAL_TABLET | Freq: Every day | ORAL | Status: DC
  Administered 2018-04-25: 40 mg via ORAL
  Filled 2018-04-25 (×2): qty 2

## 2018-04-25 MED ORDER — DIAZEPAM 2 MG PO TABS
2.0000 mg | ORAL_TABLET | Freq: Once | ORAL | Status: AC
  Administered 2018-04-25: 2 mg via ORAL
  Filled 2018-04-25: qty 1

## 2018-04-25 MED ORDER — SENNA 8.6 MG PO TABS
1.0000 | ORAL_TABLET | Freq: Two times a day (BID) | ORAL | Status: DC
  Administered 2018-04-25 – 2018-04-26 (×3): 8.6 mg via ORAL
  Filled 2018-04-25 (×3): qty 1

## 2018-04-25 MED ORDER — POLYETHYLENE GLYCOL 3350 17 G PO PACK: 17.0000 g | PACK | Freq: Every day | ORAL | Status: DC | PRN

## 2018-04-25 MED ORDER — SODIUM CHLORIDE 0.9% FLUSH
3.0000 mL | Freq: Two times a day (BID) | INTRAVENOUS | Status: DC
  Administered 2018-04-25 (×3): 3 mL via INTRAVENOUS

## 2018-04-25 MED ORDER — BISACODYL 10 MG RE SUPP: 10.0000 mg | Freq: Every day | RECTAL | Status: DC | PRN

## 2018-04-25 MED ORDER — SODIUM CHLORIDE 0.9% FLUSH: 3.0000 mL | INTRAVENOUS | Status: DC | PRN

## 2018-04-25 MED ORDER — ACETAMINOPHEN 325 MG PO TABS: 650.0000 mg | ORAL_TABLET | ORAL | Status: DC | PRN

## 2018-04-25 MED ORDER — LEVOTHYROXINE SODIUM 75 MCG PO TABS
75.0000 ug | ORAL_TABLET | Freq: Every day | ORAL | Status: DC
  Administered 2018-04-25 – 2018-04-26 (×2): 75 ug via ORAL
  Filled 2018-04-25 (×2): qty 1

## 2018-04-25 MED ORDER — PHENOL 1.4 % MT LIQD: 1.0000 | OROMUCOSAL | Status: DC | PRN

## 2018-04-25 MED ORDER — DOCUSATE SODIUM 100 MG PO CAPS
100.0000 mg | ORAL_CAPSULE | Freq: Two times a day (BID) | ORAL | Status: DC
  Administered 2018-04-25 – 2018-04-26 (×3): 100 mg via ORAL
  Filled 2018-04-25 (×3): qty 1

## 2018-04-25 MED ORDER — ONDANSETRON HCL 4 MG/2ML IJ SOLN
4.0000 mg | Freq: Four times a day (QID) | INTRAMUSCULAR | Status: DC | PRN
  Administered 2018-04-25 (×2): 4 mg via INTRAVENOUS
  Filled 2018-04-25 (×4): qty 2

## 2018-04-25 MED ORDER — SODIUM CHLORIDE 0.9 % IV SOLN
250.0000 mL | INTRAVENOUS | Status: DC
  Administered 2018-04-25: 250 mL via INTRAVENOUS

## 2018-04-25 MED ORDER — DEXAMETHASONE SODIUM PHOSPHATE 10 MG/ML IJ SOLN
10.0000 mg | Freq: Once | INTRAMUSCULAR | Status: AC
  Administered 2018-04-25: 10 mg via INTRAVENOUS
  Filled 2018-04-25: qty 1

## 2018-04-25 MED ORDER — FLEET ENEMA 7-19 GM/118ML RE ENEM: 1.0000 | ENEMA | Freq: Once | RECTAL | Status: DC | PRN

## 2018-04-25 MED ORDER — ACETAMINOPHEN 650 MG RE SUPP: 650.0000 mg | RECTAL | Status: DC | PRN

## 2018-04-25 MED ORDER — MENTHOL 3 MG MT LOZG: 1.0000 | LOZENGE | OROMUCOSAL | Status: DC | PRN

## 2018-04-25 MED ORDER — ONDANSETRON HCL 4 MG PO TABS: 4.0000 mg | ORAL_TABLET | Freq: Four times a day (QID) | ORAL | Status: DC | PRN

## 2018-04-25 MED ORDER — ALUM & MAG HYDROXIDE-SIMETH 200-200-20 MG/5ML PO SUSP
30.0000 mL | ORAL | Status: DC | PRN
  Administered 2018-04-25: 30 mL via ORAL

## 2018-04-25 MED ORDER — OXYCODONE HCL 5 MG PO TABS
10.0000 mg | ORAL_TABLET | ORAL | Status: DC | PRN
  Administered 2018-04-25 – 2018-04-26 (×6): 10 mg via ORAL
  Filled 2018-04-25 (×6): qty 2

## 2018-04-25 MED ORDER — MORPHINE SULFATE (PF) 2 MG/ML IV SOLN
2.0000 mg | INTRAVENOUS | Status: DC | PRN
  Administered 2018-04-25 (×5): 2 mg via INTRAVENOUS
  Filled 2018-04-25 (×5): qty 1

## 2018-04-25 MED ORDER — PANTOPRAZOLE SODIUM 40 MG PO TBEC
40.0000 mg | DELAYED_RELEASE_TABLET | Freq: Every day | ORAL | Status: DC
  Administered 2018-04-25 – 2018-04-26 (×2): 40 mg via ORAL
  Filled 2018-04-25 (×2): qty 1

## 2018-04-25 NOTE — Anesthesia Postprocedure Evaluation (Signed)
Anesthesia Post Note  Patient: Madison Jennings  Procedure(s) Performed: Left Thoracic ten- thoracic eleven Microdiscectomy, Removal of Spinal Cord Stimulator (Left Back)     Patient location during evaluation: PACU Anesthesia Type: General Level of consciousness: patient cooperative, oriented and sedated Pain management: pain level controlled Vital Signs Assessment: post-procedure vital signs reviewed and stable Respiratory status: spontaneous breathing, nonlabored ventilation and respiratory function stable Cardiovascular status: blood pressure returned to baseline and stable Postop Assessment: no apparent nausea or vomiting Anesthetic complications: no    Last Vitals:  Vitals:   04/24/18 2331 04/25/18 0001  BP: 140/67 (!) 143/65  Pulse: 96 92  Resp: 18 18  Temp: (!) 36.4 C 36.5 C  SpO2: 95% 95%    Last Pain:  Vitals:   04/25/18 0322  TempSrc:   PainSc: 9                  Stillman Buenger,E. Onnika Siebel

## 2018-04-25 NOTE — Evaluation (Signed)
Occupational Therapy Evaluation Patient Details Name: Madison Jennings MRN: [id number] DOB: 30-Jun-1940 Today's Date: 04/25/2018    History of Present Illness  Pt is a 78 yr old female who had T10-T11 left laminotomy and microdiscectomy with operating microscope microdissection technique and removal of expired spinal cord stimulator leads and generator. PMH: arthritis, chronic back pain, GERD, HTN, joint pain, obesity, back sx x 3   Clinical Impression   Patient is s/p  T10-T11 left laminotomy and microdiscectomy with operating microscope microdissection technique and removal of expired spinal cord stimulator leads and generator surgery resulting in the deficits listed below (see OT Problem List). The patient lives in a single level home with husband. The husband reported he would be home 24/7 and if not has family support. The patient was limited today due to nausea in session. The patient's  husband reported that he can help her with everything when returning home. The patient at this time was unable to report precautions and required multiple cues in session. The patient was issued back booklet precautions and reviewed with the patient and husband. The patient requires min assist with LE dressing and showering at this time. The patient's husband reported they have shower chair, 3 in 1 commode, reacher, sock aid, cane and walker at home. Patient will benefit from skilled acute OT to increase their safety and independence with ADL and functional mobility for ADL (while adhering to their precautions) to facilitate discharge to home.       Follow Up Recommendations  Supervision/Assistance - 24 hour    Equipment Recommendations       Recommendations for Other Services       Precautions / Restrictions Precautions Precautions: Back Precaution Booklet Issued: Yes (comment) Precaution Comments: unable to recall precautions Restrictions Weight Bearing Restrictions: No      Mobility Bed  Mobility Overal bed mobility: Needs Assistance Bed Mobility: Supine to Sit;Sit to Supine     Supine to sit: Supervision;HOB elevated Sit to supine: Supervision;HOB elevated   General bed mobility comments: cues to follow precautions  Transfers Overall transfer level: Needs assistance Equipment used: Rolling walker (2 wheeled) Transfers: Sit to/from Stand Sit to Stand: Min guard              Balance Overall balance assessment: Needs assistance Sitting-balance support: Feet supported;Bilateral upper extremity supported Sitting balance-Leahy Scale: Fair   Postural control: Left lateral lean Standing balance support: Bilateral upper extremity supported                               ADL either performed or assessed with clinical judgement   ADL Overall ADL's : Needs assistance/impaired Eating/Feeding: Modified independent;Sitting   Grooming: Wash/dry hands;Sitting;Min guard   Upper Body Bathing: Set up;Cueing for compensatory techniques;Cueing for sequencing;Cueing for safety;Sitting   Lower Body Bathing: Minimal assistance;Cueing for sequencing;Cueing for compensatory techniques;Cueing for back precautions;Sit to/from stand   Upper Body Dressing : Set up;Cueing for safety;Cueing for sequencing;Cueing for compensatory techniques;Sitting;Standing   Lower Body Dressing: Minimal assistance;Cueing for safety;Cueing for sequencing;Cueing for compensatory techniques;Cueing for back precautions;Sit to/from stand   Toilet Transfer: Min guard;Comfort height toilet;Cueing for sequencing;Cueing for safety   Toileting- Water quality scientist and Hygiene: Min guard;Cueing for safety;Cueing for sequencing;With adaptive equipment;Cueing for compensatory techniques;Cueing for back precautions   Tub/ Shower Transfer: Moderate assistance;Adhering to back precautions;Cueing for safety;Cueing for sequencing;Shower seat   Functional mobility during ADLs: Min guard;Cueing for  safety;Cueing for sequencing;Rolling walker  Vision Baseline Vision/History: Wears glasses Wears Glasses: At all times Patient Visual Report: No change from baseline Vision Assessment?: No apparent visual deficits     Perception Perception Perception Tested?: No   Praxis Praxis Praxis tested?: Not tested    Pertinent Vitals/Pain Pain Assessment: Faces Faces Pain Scale: Hurts little more Pain Location: back Pain Descriptors / Indicators: Aching Pain Intervention(s): Limited activity within patient's tolerance;Repositioned;Monitored during session     Hand Dominance Right   Extremity/Trunk Assessment Upper Extremity Assessment Upper Extremity Assessment: Overall WFL for tasks assessed   Lower Extremity Assessment Lower Extremity Assessment: Defer to PT evaluation   Cervical / Trunk Assessment Cervical / Trunk Assessment: Other exceptions Cervical / Trunk Exceptions: post sx   Communication Communication Communication: No difficulties   Cognition Arousal/Alertness: Awake/alert Behavior During Therapy: WFL for tasks assessed/performed Overall Cognitive Status: Within Functional Limits for tasks assessed                                 General Comments: husband will attmept to answer questions    General Comments       Exercises     Shoulder Instructions      Home Living Family/patient expects to be discharged to:: Private residence Living Arrangements: Spouse/significant other;Other relatives Available Help at Discharge: Available 24 hours/day Type of Home: House Home Access: Stairs to enter [employer] of Steps: 4 Entrance Stairs-Rails: Can reach both Home Layout: One level     Bathroom Shower/Tub: Occupational psychologist: Standard Bathroom Accessibility: Yes How Accessible: Accessible via walker Home Equipment: [place] - 2 wheels;Shower seat;Bedside commode;Cane - single point;Adaptive equipment Adaptive  Equipment: Reacher;Sock aid        Prior Functioning/Environment Level of Independence: Needs assistance    ADL's / Homemaking Assistance Needed: husband assisting as needed and there at all times Communication / Swallowing Assistance Needed: [facility]          OT Problem List: Decreased strength;Decreased activity tolerance;Impaired balance (sitting and/or standing);Decreased safety awareness;Decreased cognition;Decreased knowledge of use of DME or AE;Decreased knowledge of precautions;Pain      OT Treatment/Interventions: Self-care/ADL training;DME and/or AE instruction;Therapeutic activities;Patient/family education;Cognitive remediation/compensation    OT Goals(Current goals can be found in the care plan section) Acute Rehab OT Goals Patient Stated Goal: to feel better OT Goal Formulation: With patient Time For Goal Achievement: 05/02/18 Potential to Achieve Goals: Good ADL Goals Pt Will Perform Lower Body Dressing: with set-up;with adaptive equipment;sit to/from stand Pt Will Transfer to Toilet: with modified independence;ambulating Pt Will Perform Tub/Shower Transfer: Shower transfer;with supervision;shower seat  OT Frequency: Min 2X/week   Barriers to D/C:            Co-evaluation              AM-PAC OT "6 Clicks" Daily Activity     Outcome Measure Help from another person eating meals?: None Help from another person taking care of personal grooming?: A Little Help from another person toileting, which includes using toliet, bedpan, or urinal?: A Little Help from another person bathing (including washing, rinsing, drying)?: A Lot Help from another person to put on and taking off regular upper body clothing?: A Little Help from another person to put on and taking off regular lower body clothing?: A Lot 6 Click Score: 17   End of Session Equipment Utilized During Treatment: Gait belt;Rolling walker  Activity Tolerance: Patient limited by fatigue;Patient limited by  pain Patient left: in bed;with family/visitor present;with call bell/phone within reach  OT Visit Diagnosis: Unsteadiness on feet (R26.81);Pain;Muscle weakness (generalized) (M62.81) Pain - part of body: (back)                Time: 3612-2449 OT Time Calculation (min): 26 min Charges:  OT General Charges $OT Visit: 1 Visit OT Evaluation $OT Eval Low Complexity: 1 Low OT Treatments $Self Care/Home Management : 8-22 mins  Joeseph Amor OTR/L  Acute Rehab Services  [phone] office number [phone] pager number   Joeseph Amor 04/25/2018, 1:15 PM

## 2018-04-25 NOTE — Evaluation (Signed)
Physical Therapy Evaluation Patient Details Name: Madison Jennings MRN: [id number] DOB: [birth year] Today's Date: 04/25/2018   History of Present Illness   Pt is a 78 yr old female who had T10-T11 left laminotomy, microdiscectomy and removal of expired spinal cord stimulator. PMH: arthritis, chronic back pain, GERD, HTN, joint pain, obesity, back sx x 3    Clinical Impression  Pt presented in R sidelying in bed, awake and willing to participate in therapy session. Pt's husband present throughout session as well. Pt very anxious and tearful throughout secondary to pain and nausea. She currently requires min A for bed mobility, min guard for transfers and min A to take a few side steps at EOB. PT reviewed 3/3 back precautions as well as log roll technique with pt throughout. Pt would continue to benefit from skilled physical therapy services at this time while admitted and after d/c to address the below listed limitations in order to improve overall safety and independence with functional mobility.     Follow Up Recommendations Home health PT;Supervision/Assistance - 24 hour    Equipment Recommendations  None recommended by PT    Recommendations for Other Services       Precautions / Restrictions Precautions Precautions: Back Precaution Booklet Issued: Yes (comment) Precaution Comments: PT reviewed 3/3 back precautions with pt and pt's spouse throughout Required Braces or Orthoses: (no brace needed per MD order) Restrictions Weight Bearing Restrictions: No      Mobility  Bed Mobility Overal bed mobility: Needs Assistance Bed Mobility: Sidelying to Sit;Sit to Sidelying   Sidelying to sit: Min assist Supine to sit: Supervision;HOB elevated Sit to supine: Supervision;HOB elevated Sit to sidelying: Min assist General bed mobility comments: increased time and effort, cueing for log roll technique, assist for trunk elevation and to return LEs back onto bed  Transfers Overall transfer  level: Needs assistance Equipment used: None Transfers: Sit to/from Stand Sit to Stand: Min guard         General transfer comment: min guard for safety, increased time and effort  Ambulation/Gait             General Gait Details: pt only able to tolerate taking a few side steps at EOB with min A and 1HHA; pt very limited secondary to pain and nausea  Stairs            Wheelchair Mobility    Modified Rankin (Stroke Patients Only)       Balance Overall balance assessment: Needs assistance Sitting-balance support: Feet supported Sitting balance-Leahy Scale: Fair   Postural control: Left lateral lean Standing balance support: Single extremity supported Standing balance-Leahy Scale: Poor                               Pertinent Vitals/Pain Pain Assessment: Faces Faces Pain Scale: Hurts worst Pain Location: back Pain Descriptors / Indicators: Crying;Discomfort Pain Intervention(s): Monitored during session;Repositioned;Premedicated before session    [place] expects to be discharged to:: Private residence Living Arrangements: Spouse/significant other;Other relatives Available Help at Discharge: Available 24 hours/day Type of Home: House Home Access: Stairs to enter Entrance Stairs-Rails: Can reach both Entrance Stairs-Number of Steps: 4 Home Layout: One level Home Equipment: Walker - 2 wheels;Shower seat;Bedside commode;Cane - single point;Adaptive equipment      Prior Function Level of Independence: Needs assistance   Gait / Transfers Assistance Needed: ambulates with a cane  ADL's / Homemaking Assistance Needed: husband assisting as needed  and there at all times        Hand Dominance   Dominant Hand: Right    Extremity/Trunk Assessment   Upper Extremity Assessment Upper Extremity Assessment: Defer to OT evaluation;Overall [facility] for tasks assessed    Lower Extremity Assessment Lower Extremity Assessment: Overall  WFL for tasks assessed    Cervical / Trunk Assessment Cervical / Trunk Assessment: Other exceptions Cervical / Trunk Exceptions: s/p thoracic spine sx  Communication   Communication: No difficulties  Cognition Arousal/Alertness: Awake/alert Behavior During Therapy: Anxious Overall Cognitive Status: Within Functional Limits for tasks assessed                                 General Comments: husband will attmept to answer questions       General Comments      Exercises     Assessment/Plan    PT Assessment Patient needs continued PT services  PT Problem List Decreased activity tolerance;Decreased balance;Decreased mobility;Decreased coordination;Decreased knowledge of use of DME;Decreased safety awareness;Decreased knowledge of precautions;Pain       PT Treatment Interventions DME instruction;Gait training;Stair training;Functional mobility training;Therapeutic activities;Therapeutic exercise;Balance training;Neuromuscular re-education;Patient/family education    PT Goals (Current goals can be found in the Care Plan section)  Acute Rehab PT Goals Patient Stated Goal: decrease pain PT Goal Formulation: With patient Time For Goal Achievement: 05/09/18 Potential to Achieve Goals: Good    Frequency Min 5X/week   Barriers to discharge        Co-evaluation               AM-PAC PT "6 Clicks" Mobility  Outcome Measure Help needed turning from your back to your side while in a flat bed without using bedrails?: A Little Help needed moving from lying on your back to sitting on the side of a flat bed without using bedrails?: A Little Help needed moving to and from a bed to a chair (including a wheelchair)?: A Little Help needed standing up from a chair using your arms (e.g., wheelchair or bedside chair)?: A Little Help needed to walk in hospital room?: A Little Help needed climbing 3-5 steps with a railing? : A Little 6 Click Score: 18    End of Session    Activity Tolerance: Patient limited by pain;Other (comment)(limited secondary to nausea) Patient left: in bed;with call bell/phone within reach;with family/visitor present Nurse Communication: Mobility status PT Visit Diagnosis: Other abnormalities of gait and mobility (R26.89);Pain Pain - part of body: (back)    Time: 6283-6629 PT Time Calculation (min) (ACUTE ONLY): 25 min   Charges:   PT Evaluation $PT Eval Moderate Complexity: 1 Mod PT Treatments $Therapeutic Activity: 8-22 mins        Sherie Don, PT, DPT  Acute Rehabilitation Services Pager [phone] Office [place] 04/25/2018, 1:45 PM

## 2018-04-25 NOTE — Plan of Care (Signed)
Pt's mobility continues to improve. Pt is tolerating walking into the bathroom when she needs to use the bathroom Problem: Activity: Goal: Ability to avoid complications of mobility impairment will improve Outcome: Progressing  ; instead of BSC.

## 2018-04-25 NOTE — Progress Notes (Signed)
Pt admitted from PACU post op back surgery, alert and oriented, c/o of pain with a rating of 7, pt settled in bed with call light and family at bedside, safety concern addressed accordingly, was however reassured and will continue to monitor, v/s stable. Obasogie-Asidi, Ahmira Boisselle Efe

## 2018-04-25 NOTE — Clinical Social Work Note (Signed)
Clinical Social Work Assessment  Patient Details  Name: Madison Jennings MRN: [id number] Date of Birth: [birth year]  Date of referral:  04/25/18               Reason for consult:  Discharge Planning                Permission sought to share information with:  Case Manager Permission granted to share information::  Yes, Verbal Permission Granted  Name::     Madison Jennings  Agency::  Declined SNF  Relationship::  Spouse  Contact Information:  [phone]  Housing/Transportation Living arrangements for the past 2 months:  [place] of Information:  Patient Patient Interpreter Needed:  None Criminal Activity/Legal Involvement Pertinent to Current Situation/Hospitalization:  No - Comment as needed Significant Relationships:  Spouse, Adult Children Lives with:  Spouse Do you feel safe going back to the place where you live?  Yes Need for family participation in patient care:  No (Coment)  Care giving concerns:    Patient was evaluated by physical therapy and was recommended for skilled nursing.    Social Worker assessment / plan:    CSW met with the patient. The patient's spouse was at bedside assisting the patient. Patient stated that she was in pain, CSW offered to come back later. Patient agreed for CSW to stay. CSW introduced herself and explained her role. The patient is not familiar with SNF. CSW explained the skilled nursing process. Patient is not agreeable to going to a facility. Patient wants to return home with her husband. Patient's husband agreed that he was able to continue to care for her, he does it on a daily basis. CSW referred the patient and her husband for home health. Patient is agreeable to receiving therapy in her home. CSW stated that she will have the case manager reach out to the family to coordinate home health.   Employment status:  Retired Nurse, adult PT Recommendations:  [place] /  Referral to community resources:  [place]  Patient/Family's Response to care:  Patient is aware of her current medical condition. Patient stated that she is in pain and did not sleep well.   Patient/Family's Understanding of and Emotional Response to Diagnosis, Current Treatment, and Prognosis:  Patient's husband is supportive of his wife and is able to help care for her.   Emotional Assessment Appearance:  Appears stated age Attitude/Demeanor/Rapport:  Unable to Assess Affect (typically observed):  Unable to Assess Orientation:  Oriented to Self, Oriented to Place, Oriented to  Time, Oriented to Situation Alcohol / Substance use:  Not Applicable Psych involvement (Current and /or in the community):  No (Comment)  Discharge Needs  Concerns to be addressed:  Discharge Planning Concerns Readmission within the last 30 days:  No Current discharge risk:  Dependent with Mobility Barriers to Discharge:  No Barriers Identified   Atsushi Yom B Emmauel Hallums, LCSWA 04/25/2018, 10:12 AM

## 2018-04-25 NOTE — Care Management Note (Signed)
Case Management Note  Patient Details  Name: Madison Jennings MRN: [id number] Date of Birth: [birth year]  Subjective/Objective:                    Action/Plan:  Spoke w patient and spouse at bedside. They decline SNF. They would like to DC to home w [facility] services. Provided w Medicare quality list and they would like to use [facility] as first choice, [facility] as second choice. [facility] is able to take for PT (not RN), will need HH PT order and face to face.  Patient has 3/1 RW and shower seat at home. Spouse will provide transport home.  No other CM needs identified at this time.   Expected Discharge Date:                  Expected Discharge Plan:  [place]  In-House Referral:  Clinical Social Work  Discharge planning Services  CM Consult  Post Acute Care Choice:  Home Health Choice offered to:  Spouse, Patient  DME Arranged:    DME Agency:     HH Arranged:  Refused SNF [place] Agency:     Status of Service:  In process, will continue to follow  If discussed at Long Length of Stay Meetings, dates discussed:    Additional Comments:  Carles Collet, RN 04/25/2018, 12:02 PM

## 2018-04-25 NOTE — Progress Notes (Signed)
Subjective: Patient reports moderate thoracic back pain with some radiation around her ribs. Started after trying to sit up.  Objective: Vital signs in last 24 hours: Temp:  [97.5 F (36.4 C)-99.6 F (37.6 C)] 99.6 F (37.6 C) (02/29 0734) Pulse Rate:  [63-107] 101 (02/29 0734) Resp:  [15-21] 18 (02/29 0734) BP: (107-165)/(52-73) 134/52 (02/29 0857) SpO2:  [93 %-99 %] 93 % (02/29 0734) Weight:  [94.8 kg-100 kg] 100 kg (02/29 0001)  Intake/Output from previous day: 02/28 0701 - 02/29 0700 In: 1713.5 [P.O.:350; I.V.:1363.5] Out: 400 [Urine:350; Blood:50] Intake/Output this shift: No intake/output data recorded.  Neurologic: Grossly normal  Lab Results: Lab Results  Component Value Date   WBC 7.2 04/16/2018   HGB 14.0 04/16/2018   HCT 43.5 04/16/2018   MCV 94.2 04/16/2018   PLT 249 04/16/2018   Lab Results  Component Value Date   INR 0.96 07/07/2013   BMET Lab Results  Component Value Date   NA 136 04/16/2018   K 4.4 04/16/2018   CL 103 04/16/2018   CO2 21 (L) 04/16/2018   GLUCOSE 108 (H) 04/16/2018   BUN 15 04/16/2018   CREATININE 0.96 04/16/2018   CALCIUM 9.9 04/16/2018    Studies/Results: Dg Thoracolumabar Spine  Result Date: 04/25/2018 CLINICAL DATA:  Left T10-11 micro discectomy. Lumbar spinal cord stimulator removal. EXAM: THORACOLUMBAR SPINE 1V COMPARISON:  Lumbar myelogram 03/31/2018 FINDINGS: Intraoperative portable cross-table lateral views of the lower thoracic and spine are obtained. Postoperative changes with posterior rod and screw fixation from L4 to the sacrum and intervertebral disc prosthesis at L4-5 and L5-S1. Fracture of 1 of the sacral screws. Spinal stimulator with generator pack in the soft tissues over the lumbar region and lead tips extending into the midthoracic spine at least to the T8 level. A localization needle is placed over the posterior elements at the T11 level. Degenerative changes in the lumbar spine. Normal alignment. IMPRESSION:  Intraoperative spine imaging obtained for localization purposes. Electronically Signed   By: Lucienne Capers M.D.   On: 04/25/2018 00:29    Assessment/Plan: Postop day 1 thoracic diskectomy. Will add small dose of valium to see if this helps with her pain. Some edema around left side of incision.    LOS: 1 day    Ocie Cornfield Patrik Turnbaugh 04/25/2018, 12:26 PM

## 2018-04-26 MED ORDER — OXYCODONE HCL 10 MG PO TABS: 10.0000 mg | ORAL_TABLET | ORAL | 0 refills | Status: DC | PRN

## 2018-04-26 MED ORDER — METHOCARBAMOL 500 MG PO TABS: 500.0000 mg | ORAL_TABLET | Freq: Four times a day (QID) | ORAL | 0 refills | Status: DC | PRN

## 2018-04-26 NOTE — Care Management (Signed)
Notified [facility] that patient will DC today. Will start [facility] service in 24-48 hours.

## 2018-04-26 NOTE — Progress Notes (Signed)
Physical Therapy Treatment Patient Details Name: Madison Jennings MRN: [id number] DOB: 1940-09-01 Today's Date: 04/26/2018    History of Present Illness  Pt is a 78 yr old female who had T10-T11 left laminotomy, microdiscectomy and removal of expired spinal cord stimulator. PMH: arthritis, chronic back pain, GERD, HTN, joint pain, obesity, back sx x 3    PT Comments    Pt moving and feeling significantly better than yesterday. Pt and husband feel ready for DC home. I have encouraged the patient to gradually increase activity daily to tolerance.  I have answered all patient's and husband's question regarding PT and mobility.     Follow Up Recommendations  Home health PT;Supervision for mobility/OOB     Equipment Recommendations  None recommended by PT    Recommendations for Other Services       Precautions / Restrictions Precautions Precautions: Back Precaution Booklet Issued: Yes (comment) Precaution Comments: Pt able to recall 2/3 back precautions.  Educated pt and spouse on precautions especailly in relation to tasks at home  Restrictions Weight Bearing Restrictions: No    Mobility  Bed Mobility Overal bed mobility: (Not observed, Pt reports no difficuly with bed mobility)                Transfers Overall transfer level: Needs assistance Equipment used: Straight cane Transfers: Sit to/from Stand Sit to Stand: Supervision         General transfer comment: (pt demonstrated safe technique)  Ambulation/Gait Ambulation/Gait assistance: Supervision Gait Distance (Feet): 120 Feet Assistive device: Straight cane Gait Pattern/deviations: Step-through pattern     General Gait Details: no balance losses. Pt reports when she fatigues she goes into forward flexion   Stairs Stairs: Yes Stairs assistance: Supervision Stair Management: One rail Left;With cane;Forwards;Step to pattern Number of Stairs: 3     Wheelchair Mobility    Modified Rankin (Stroke Patients  Only)       Balance                                            Cognition Arousal/Alertness: Awake/alert Behavior During Therapy: WFL for tasks assessed/performed Overall Cognitive Status: Within Functional Limits for tasks assessed                                        Exercises      General Comments General comments (skin integrity, edema, etc.): husband present for majority of session      Pertinent Vitals/Pain Pain Assessment: 0-10 Pain Score: 6  Pain Location: mid back Pain Intervention(s): Limited activity within patient's tolerance;Monitored during session;Premedicated before session    Home Living                      Prior Function            PT Goals (current goals can now be found in the care plan section) Acute Rehab PT Goals Patient Stated Goal: to go home and get back to fishing  Progress towards PT goals: Progressing toward goals    Frequency    Min 5X/week      PT Plan Current plan remains appropriate    Co-evaluation              AM-PAC PT "6 Clicks" Mobility  Outcome Measure  Help needed turning from your back to your side while in a flat bed without using bedrails?: None Help needed moving from lying on your back to sitting on the side of a flat bed without using bedrails?: None Help needed moving to and from a bed to a chair (including a wheelchair)?: None Help needed standing up from a chair using your arms (e.g., wheelchair or bedside chair)?: None Help needed to walk in hospital room?: A Little Help needed climbing 3-5 steps with a railing? : A Little 6 Click Score: 22    End of Session Equipment Utilized During Treatment: Gait belt Activity Tolerance: Patient tolerated treatment well Patient left: in chair;with call bell/phone within reach;with family/visitor present   PT Visit Diagnosis: Other abnormalities of gait and mobility (R26.89)     Time: 6945-0388 PT Time  Calculation (min) (ACUTE ONLY): 13 min  Charges:  $Gait Training: 8-22 mins                     Lavonia Dana, [place]  Pager [phone] Office [phone] 04/26/2018    Melvern Banker 04/26/2018, 10:04 AM

## 2018-04-26 NOTE — Progress Notes (Signed)
NURSING PROGRESS NOTE  Madison Jennings [id number] Discharge Data: 04/26/2018 1:36 PM Attending Provider: Kristeen Miss, MD TKP:[id number], Viann Shove, MD     [place] to be D/C'd Home per MD order.  Discussed with the patient the After Visit Summary and all questions fully answered. All IV's discontinued with no bleeding noted. All belongings returned to patient for patient to take home.   Last Vital Signs:  Blood pressure (!) 94/41, pulse (!) 53, temperature 97.8 F (36.6 C), temperature source Oral, resp. rate 18, height 5\' 4"  (1.626 m), weight 100 kg, SpO2 93 %.  Discharge Medication List Allergies as of 04/26/2018   No Known Allergies     Medication List    TAKE these medications   cyclobenzaprine 5 MG tablet Commonly known as:  FLEXERIL Take 1 tablet (5 mg total) by mouth 3 (three) times daily as needed for muscle spasms.   diclofenac sodium 1 % Gel Commonly known as:  VOLTAREN Apply 2 g topically 4 (four) times daily. What changed:    when to take this  reasons to take this   gabapentin 300 MG capsule Commonly known as:  NEURONTIN Take 300 mg by mouth 3 (three) times daily.   levothyroxine 75 MCG tablet Commonly known as:  SYNTHROID, LEVOTHROID Take 75 mcg by mouth daily before breakfast.   lisinopril 40 MG tablet Commonly known as:  PRINIVIL,ZESTRIL Take 40 mg by mouth daily.   methocarbamol 500 MG tablet Commonly known as:  ROBAXIN Take 1 tablet (500 mg total) by mouth every 6 (six) hours as needed for muscle spasms.   Oxycodone HCl 10 MG Tabs Take 10 mg by mouth every 8 (eight) hours. What changed:  Another medication with the same name was added. Make sure you understand how and when to take each.   Oxycodone HCl 10 MG Tabs Take 1 tablet (10 mg total) by mouth every 4 (four) hours as needed for severe pain ((score 7 to 10)). What changed:  You were already taking a medication with the same name, and this prescription was added. Make sure you understand how and  when to take each.   pantoprazole 40 MG tablet Commonly known as:  PROTONIX Take 40 mg by mouth daily.

## 2018-04-26 NOTE — Discharge Summary (Signed)
Physician Discharge Summary  Patient ID: Madison Jennings MRN: [id number] DOB/AGE: [id number] y.o.  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  Expired nonfunctioning spinal cord stimulator   Discharge Diagnoses: same   Discharged Condition: good  Hospital Course: The patient was admitted on 04/24/2018 and taken to the operating room where the patient underwent T10-T11 laminotomy and microdiscectomy. The patient tolerated the procedure well and was taken to the recovery room and then to the floor in stable condition. The hospital course was routine. There were no complications. The wound remained clean dry and intact. Pt had appropriate back soreness. No complaints of leg pain or new N/T/W. The patient remained afebrile with stable vital signs, and tolerated a regular diet. The patient continued to increase activities, and pain was well controlled with oral pain medications.   Consults: None  Significant Diagnostic Studies:  Results for orders placed or performed during the hospital encounter of 04/16/18  Surgical pcr screen  Result Value Ref Range   MRSA, PCR NEGATIVE NEGATIVE   Staphylococcus aureus NEGATIVE NEGATIVE  Basic metabolic panel  Result Value Ref Range   Sodium 136 135 - 145 mmol/L   Potassium 4.4 3.5 - 5.1 mmol/L   Chloride 103 98 - 111 mmol/L   CO2 21 (L) 22 - 32 mmol/L   Glucose, Bld 108 (H) 70 - 99 mg/dL   BUN 15 8 - 23 mg/dL   Creatinine, Ser 0.96 0.44 - 1.00 mg/dL   Calcium 9.9 8.9 - 10.3 mg/dL   GFR calc non Af Amer 57 (L) >60 mL/min   GFR calc Af Amer >60 >60 mL/min   Anion gap 12 5 - 15  CBC  Result Value Ref Range   WBC 7.2 4.0 - 10.5 K/uL   RBC 4.62 3.87 - 5.11 MIL/uL   Hemoglobin 14.0 12.0 - 15.0 g/dL   HCT 43.5 36.0 - 46.0 %   MCV 94.2 80.0 - 100.0 fL   MCH 30.3 26.0 - 34.0 pg   MCHC 32.2 30.0 - 36.0 g/dL   RDW 13.7 11.5 - 15.5 %    Platelets 249 150 - 400 K/uL   nRBC 0.0 0.0 - 0.2 %    Dg Chest 2 View  Result Date: 04/01/2018 CLINICAL DATA:  Episode chest pain at 3:30 a.m. last night and 2 p.m. today. EXAM: CHEST - 2 VIEW COMPARISON:  PA and lateral chest 07/25/2014. FINDINGS: The lungs are clear. Heart size is normal. Aortic atherosclerosis is noted. No pneumothorax or pleural fluid. No acute bony abnormality. Spinal stimulator is in place. There is partial visualization of lumbar fusion hardware. Lower thoracic and upper lumbar spondylosis noted. IMPRESSION: No acute disease. Atherosclerosis. Electronically Signed   By: Inge Rise M.D.   On: 04/01/2018 14:52   Dg Thoracolumabar Spine  Result Date: 04/25/2018 CLINICAL DATA:  Left T10-11 micro discectomy. Lumbar spinal cord stimulator removal. EXAM: THORACOLUMBAR SPINE 1V COMPARISON:  Lumbar myelogram 03/31/2018 FINDINGS: Intraoperative portable cross-table lateral views of the lower thoracic and spine are obtained. Postoperative changes with posterior rod and screw fixation from L4 to the sacrum and intervertebral disc prosthesis at L4-5 and L5-S1. Fracture of 1 of the sacral screws. Spinal stimulator with generator pack in the soft tissues over the lumbar region and lead tips extending into the midthoracic spine at least to the T8 level. A localization needle is placed over the posterior elements at the T11 level.  Degenerative changes in the lumbar spine. Normal alignment. IMPRESSION: Intraoperative spine imaging obtained for localization purposes. Electronically Signed   By: Lucienne Capers M.D.   On: 04/25/2018 00:29   Ct Cervical Spine W Contrast  Result Date: 03/31/2018 CLINICAL DATA:  Previous lumbosacral fusion. Previous neurostimulator. Spinal stenosis. Back and leg pain. Shoulder and arm pain. FLUOROSCOPY TIME:  0 minutes 48 seconds. 1005.10 micro gray meter squared PROCEDURE: LUMBAR PUNCTURE FOR CERVICAL LUMBAR AND THORACIC MYELOGRAM CERVICAL AND LUMBAR AND  THORACIC MYELOGRAM CT CERVICAL MYELOGRAM CT LUMBAR MYELOGRAM CT THORACIC MYELOGRAM Lumbar puncture and contrast injection was performed by Dr. Ellene Route. I personally performed the  acquisition of the myelogram images. TECHNIQUE: Contiguous axial images were obtained through the Cervical, Thoracic, and Lumbar spine after the intrathecal infusion of infusion. Coronal and sagittal reconstructions were obtained of the axial image sets. FINDINGS: CERVICAL AND LUMBAR MYELOGRAM FINDINGS: Lumbar puncture was done at the L2-3 level. There is been previous discectomy and fusion from L4 to the sacrum with pedicle screws and posterior rods. Screw fractures noted on the right at S1. There is no central canal stenosis. Disc degeneration at L1-2 with vacuum phenomenon but no apparent compressive stenosis. Chronic disc space narrowing L2-3 and L3-4. 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 In the cervical region, there are anterior extradural defects from C3-4 through C6-7. Slightly diminished root sleeve filling on both sides at those levels. No critical stenosis seen. CT CERVICAL MYELOGRAM FINDINGS: Foramen magnum is widely patent.  C1-2 and C2-3 are normal. C3-4: Mild bulging of the disc and mild uncovertebral prominence. No canal or foraminal stenosis. C4-5: Mild bulging of the disc and mild uncovertebral prominence. Mild facet degeneration on the left. No compressive canal stenosis. Mild bilateral foraminal narrowing. C5-6: Spondylosis with endplate osteophytes and bulging of the disc. Foraminal encroachment by osteophytes right more than left. The right C6 nerve could be affected. C6-7: Spondylosis with endplate osteophytes and bulging of the disc. No compressive central canal stenosis. Mild osteophytic encroachment upon the foramina without likely compressive stenosis. C7-T1: Facet osteoarthritis on the right which could be painful. No central canal stenosis. Mild right foraminal stenosis. Presumably benign  sclerotic focus within the left side of the T1 vertebral body. CT LUMBAR MYELOGRAM FINDINGS: L1-2: Advanced disc degeneration of vacuum phenomenon. Endplate osteophytes and bulging of the disc. Facet and ligamentous hypertrophy more on the right. Right lateral recess stenosis that could possibly cause neural compression. Findings at this level could certainly be associated with back pain. L2-3: Solid fusion posteriorly. No disc pathology. No canal or foraminal stenosis. L3-4: Solid fusion posteriorly. No disc pathology. Wide patency of the canal and foramina. Nerve root clumping suggesting arachnoiditis. L4 to sacrum: Chronic solid fusion. Wide patency of the canal and foramina. Chronic screw fracture on the right at S1 but without evidence of regional motion. Some clumping of the nerve roots suggesting a degree of arachnoiditis. CT THORACIC MYELOGRAM FINDINGS: Mild curvature convex to the right. No antero or retrolisthesis. Neuro stimulators in place in the dorsal spinal canal from the T6-7 disc level to the T9-10 disc level. No significant finding at T7-8 or above. No disc bulge or herniation. Ordinary upper thoracic facet osteoarthritis. T8-9: Disc bulge. No compressive stenosis. Mild facet degeneration. T9-10: Pronounced disc degeneration with loss of disc height and sclerotic endplate changes. Mild bulging of the disc. Mild narrowing of the lateral recesses. Bilateral foraminal stenosis. Findings at this level could be symptomatic. T10-11: Disc degeneration with a left posterolateral to  foraminal disc herniation. This indents the thecal sac. Left-sided neural compression possible at this level. T11-12: Mild bulging of the disc.  No canal or foraminal stenosis. T12-L1: Mild bulging of the disc. Mild facet and ligamentous hypertrophy. No compressive stenosis. IMPRESSION: Cervical region: Degenerative spondylosis from C3-4 through C6-7. No compressive central canal stenosis. Osteophytic encroachment could  potentially cause neural compression on the right at C5-6. Facet arthropathy on the right at C7-T1 with foraminal encroachment that could possibly affect the right C8 nerve. Foraminal narrowing at other levels appears mild to moderate at most without likely neural compression. Thoracic region: Neuro stimulators appear well positioned. Advanced degenerative spondylosis at T9-10 with sclerotic bone changes. Mild narrowing of the lateral recesses. Foraminal narrowing that could be symptomatic. T10-11 left posterolateral to foraminal disc herniation which could cause left-sided neural compression. Lumbar region: Solid fusion from L2 to the sacrum with wide patency of the canal and foramina. Arachnoiditis pattern is present. Adjacent segment degenerative changes at L1-2 with degenerative spondylosis showing disc narrowing, vacuum phenomenon and chronic endplate changes. Bulging of the disc and facet osteoarthritis. Lateral recess narrowing on the right which could possibly cause neural compression. The findings at this level could be associated with lumbago. Electronically Signed   By: Nelson Chimes M.D.   On: 03/31/2018 10:57   Ct Thoracic Spine W Contrast  Result Date: 03/31/2018 CLINICAL DATA:  Previous lumbosacral fusion. Previous neurostimulator. Spinal stenosis. Back and leg pain. Shoulder and arm pain. FLUOROSCOPY TIME:  0 minutes 48 seconds. 1005.10 micro gray meter squared PROCEDURE: LUMBAR PUNCTURE FOR CERVICAL LUMBAR AND THORACIC MYELOGRAM CERVICAL AND LUMBAR AND THORACIC MYELOGRAM CT CERVICAL MYELOGRAM CT LUMBAR MYELOGRAM CT THORACIC MYELOGRAM Lumbar puncture and contrast injection was performed by Dr. Ellene Route. I personally performed the  acquisition of the myelogram images. TECHNIQUE: Contiguous axial images were obtained through the Cervical, Thoracic, and Lumbar spine after the intrathecal infusion of infusion. Coronal and sagittal reconstructions were obtained of the axial image sets. FINDINGS: CERVICAL  AND LUMBAR MYELOGRAM FINDINGS: Lumbar puncture was done at the L2-3 level. There is been previous discectomy and fusion from L4 to the sacrum with pedicle screws and posterior rods. Screw fractures noted on the right at S1. There is no central canal stenosis. Disc degeneration at L1-2 with vacuum phenomenon but no apparent compressive stenosis. Chronic disc space narrowing L2-3 and L3-4. 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 In the cervical region, there are anterior extradural defects from C3-4 through C6-7. Slightly diminished root sleeve filling on both sides at those levels. No critical stenosis seen. CT CERVICAL MYELOGRAM FINDINGS: Foramen magnum is widely patent.  C1-2 and C2-3 are normal. C3-4: Mild bulging of the disc and mild uncovertebral prominence. No canal or foraminal stenosis. C4-5: Mild bulging of the disc and mild uncovertebral prominence. Mild facet degeneration on the left. No compressive canal stenosis. Mild bilateral foraminal narrowing. C5-6: Spondylosis with endplate osteophytes and bulging of the disc. Foraminal encroachment by osteophytes right more than left. The right C6 nerve could be affected. C6-7: Spondylosis with endplate osteophytes and bulging of the disc. No compressive central canal stenosis. Mild osteophytic encroachment upon the foramina without likely compressive stenosis. C7-T1: Facet osteoarthritis on the right which could be painful. No central canal stenosis. Mild right foraminal stenosis. Presumably benign sclerotic focus within the left side of the T1 vertebral body. CT LUMBAR MYELOGRAM FINDINGS: L1-2: Advanced disc degeneration of vacuum phenomenon. Endplate osteophytes and bulging of the disc. Facet and ligamentous hypertrophy more on  the right. Right lateral recess stenosis that could possibly cause neural compression. Findings at this level could certainly be associated with back pain. L2-3: Solid fusion posteriorly. No disc pathology. No canal or  foraminal stenosis. L3-4: Solid fusion posteriorly. No disc pathology. Wide patency of the canal and foramina. Nerve root clumping suggesting arachnoiditis. L4 to sacrum: Chronic solid fusion. Wide patency of the canal and foramina. Chronic screw fracture on the right at S1 but without evidence of regional motion. Some clumping of the nerve roots suggesting a degree of arachnoiditis. CT THORACIC MYELOGRAM FINDINGS: Mild curvature convex to the right. No antero or retrolisthesis. Neuro stimulators in place in the dorsal spinal canal from the T6-7 disc level to the T9-10 disc level. No significant finding at T7-8 or above. No disc bulge or herniation. Ordinary upper thoracic facet osteoarthritis. T8-9: Disc bulge. No compressive stenosis. Mild facet degeneration. T9-10: Pronounced disc degeneration with loss of disc height and sclerotic endplate changes. Mild bulging of the disc. Mild narrowing of the lateral recesses. Bilateral foraminal stenosis. Findings at this level could be symptomatic. T10-11: Disc degeneration with a left posterolateral to foraminal disc herniation. This indents the thecal sac. Left-sided neural compression possible at this level. T11-12: Mild bulging of the disc.  No canal or foraminal stenosis. T12-L1: Mild bulging of the disc. Mild facet and ligamentous hypertrophy. No compressive stenosis. IMPRESSION: Cervical region: Degenerative spondylosis from C3-4 through C6-7. No compressive central canal stenosis. Osteophytic encroachment could potentially cause neural compression on the right at C5-6. Facet arthropathy on the right at C7-T1 with foraminal encroachment that could possibly affect the right C8 nerve. Foraminal narrowing at other levels appears mild to moderate at most without likely neural compression. Thoracic region: Neuro stimulators appear well positioned. Advanced degenerative spondylosis at T9-10 with sclerotic bone changes. Mild narrowing of the lateral recesses. Foraminal  narrowing that could be symptomatic. T10-11 left posterolateral to foraminal disc herniation which could cause left-sided neural compression. Lumbar region: Solid fusion from L2 to the sacrum with wide patency of the canal and foramina. Arachnoiditis pattern is present. Adjacent segment degenerative changes at L1-2 with degenerative spondylosis showing disc narrowing, vacuum phenomenon and chronic endplate changes. Bulging of the disc and facet osteoarthritis. Lateral recess narrowing on the right which could possibly cause neural compression. The findings at this level could be associated with lumbago. Electronically Signed   By: Nelson Chimes M.D.   On: 03/31/2018 10:57   Ct Lumbar Spine W Contrast  Result Date: 03/31/2018 CLINICAL DATA:  Previous lumbosacral fusion. Previous neurostimulator. Spinal stenosis. Back and leg pain. Shoulder and arm pain. FLUOROSCOPY TIME:  0 minutes 48 seconds. 1005.10 micro gray meter squared PROCEDURE: LUMBAR PUNCTURE FOR CERVICAL LUMBAR AND THORACIC MYELOGRAM CERVICAL AND LUMBAR AND THORACIC MYELOGRAM CT CERVICAL MYELOGRAM CT LUMBAR MYELOGRAM CT THORACIC MYELOGRAM Lumbar puncture and contrast injection was performed by Dr. Ellene Route. I personally performed the  acquisition of the myelogram images. TECHNIQUE: Contiguous axial images were obtained through the Cervical, Thoracic, and Lumbar spine after the intrathecal infusion of infusion. Coronal and sagittal reconstructions were obtained of the axial image sets. FINDINGS: CERVICAL AND LUMBAR MYELOGRAM FINDINGS: Lumbar puncture was done at the L2-3 level. There is been previous discectomy and fusion from L4 to the sacrum with pedicle screws and posterior rods. Screw fractures noted on the right at S1. There is no central canal stenosis. Disc degeneration at L1-2 with vacuum phenomenon but no apparent compressive stenosis. Chronic disc space narrowing L2-3 and L3-4. Probable arachnoiditis  pattern in the distal thecal sac. In the  cervical region, there are anterior extradural defects from C3-4 through C6-7. Slightly diminished root sleeve filling on both sides at those levels. No critical stenosis seen. CT CERVICAL MYELOGRAM FINDINGS: Foramen magnum is widely patent.  C1-2 and C2-3 are normal. C3-4: Mild bulging of the disc and mild uncovertebral prominence. No canal or foraminal stenosis. C4-5: Mild bulging of the disc and mild uncovertebral prominence. Mild facet degeneration on the left. No compressive canal stenosis. Mild bilateral foraminal narrowing. C5-6: Spondylosis with endplate osteophytes and bulging of the disc. Foraminal encroachment by osteophytes right more than left. The right C6 nerve could be affected. C6-7: Spondylosis with endplate osteophytes and bulging of the disc. No compressive central canal stenosis. Mild osteophytic encroachment upon the foramina without likely compressive stenosis. C7-T1: Facet osteoarthritis on the right which could be painful. No central canal stenosis. Mild right foraminal stenosis. Presumably benign sclerotic focus within the left side of the T1 vertebral body. CT LUMBAR MYELOGRAM FINDINGS: L1-2: Advanced disc degeneration of vacuum phenomenon. Endplate osteophytes and bulging of the disc. Facet and ligamentous hypertrophy more on the right. Right lateral recess stenosis that could possibly cause neural compression. Findings at this level could certainly be associated with back pain. L2-3: Solid fusion posteriorly. No disc pathology. No canal or foraminal stenosis. L3-4: Solid fusion posteriorly. No disc pathology. Wide patency of the canal and foramina. Nerve root clumping suggesting arachnoiditis. L4 to sacrum: Chronic solid fusion. Wide patency of the canal and foramina. Chronic screw fracture on the right at S1 but without evidence of regional motion. Some clumping of the nerve roots suggesting a degree of arachnoiditis. CT THORACIC MYELOGRAM FINDINGS: Mild curvature convex to the right.  No antero or retrolisthesis. Neuro stimulators in place in the dorsal spinal canal from the T6-7 disc level to the T9-10 disc level. No significant finding at T7-8 or above. No disc bulge or herniation. Ordinary upper thoracic facet osteoarthritis. T8-9: Disc bulge. No compressive stenosis. Mild facet degeneration. T9-10: Pronounced disc degeneration with loss of disc height and sclerotic endplate changes. Mild bulging of the disc. Mild narrowing of the lateral recesses. Bilateral foraminal stenosis. Findings at this level could be symptomatic. T10-11: Disc degeneration with a left posterolateral to foraminal disc herniation. This indents the thecal sac. Left-sided neural compression possible at this level. T11-12: Mild bulging of the disc.  No canal or foraminal stenosis. T12-L1: Mild bulging of the disc. Mild facet and ligamentous hypertrophy. No compressive stenosis. IMPRESSION: Cervical region: Degenerative spondylosis from C3-4 through C6-7. No compressive central canal stenosis. Osteophytic encroachment could potentially cause neural compression on the right at C5-6. Facet arthropathy on the right at C7-T1 with foraminal encroachment that could possibly affect the right C8 nerve. Foraminal narrowing at other levels appears mild to moderate at most without likely neural compression. Thoracic region: Neuro stimulators appear well positioned. Advanced degenerative spondylosis at T9-10 with sclerotic bone changes. Mild narrowing of the lateral recesses. Foraminal narrowing that could be symptomatic. T10-11 left posterolateral to foraminal disc herniation which could cause left-sided neural compression. Lumbar region: Solid fusion from L2 to the sacrum with wide patency of the canal and foramina. Arachnoiditis pattern is present. Adjacent segment degenerative changes at L1-2 with degenerative spondylosis showing disc narrowing, vacuum phenomenon and chronic endplate changes. Bulging of the disc and facet  osteoarthritis. Lateral recess narrowing on the right which could possibly cause neural compression. The findings at this level could be associated with lumbago. Electronically Signed  By: Nelson Chimes M.D.   On: 03/31/2018 10:57   Dg Myelogram 2+ Regions  Result Date: 03/31/2018 CLINICAL DATA:  Previous lumbosacral fusion. Previous neurostimulator. Spinal stenosis. Back and leg pain. Shoulder and arm pain. FLUOROSCOPY TIME:  0 minutes 48 seconds. 1005.10 micro gray meter squared PROCEDURE: LUMBAR PUNCTURE FOR CERVICAL LUMBAR AND THORACIC MYELOGRAM CERVICAL AND LUMBAR AND THORACIC MYELOGRAM CT CERVICAL MYELOGRAM CT LUMBAR MYELOGRAM CT THORACIC MYELOGRAM Lumbar puncture and contrast injection was performed by Dr. Ellene Route. I personally performed the  acquisition of the myelogram images. TECHNIQUE: Contiguous axial images were obtained through the Cervical, Thoracic, and Lumbar spine after the intrathecal infusion of infusion. Coronal and sagittal reconstructions were obtained of the axial image sets. FINDINGS: CERVICAL AND LUMBAR MYELOGRAM FINDINGS: Lumbar puncture was done at the L2-3 level. There is been previous discectomy and fusion from L4 to the sacrum with pedicle screws and posterior rods. Screw fractures noted on the right at S1. There is no central canal stenosis. Disc degeneration at L1-2 with vacuum phenomenon but no apparent compressive stenosis. Chronic disc space narrowing L2-3 and L3-4. 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 In the cervical region, there are anterior extradural defects from C3-4 through C6-7. Slightly diminished root sleeve filling on both sides at those levels. No critical stenosis seen. CT CERVICAL MYELOGRAM FINDINGS: Foramen magnum is widely patent.  C1-2 and C2-3 are normal. C3-4: Mild bulging of the disc and mild uncovertebral prominence. No canal or foraminal stenosis. C4-5: Mild bulging of the disc and mild uncovertebral prominence. Mild facet degeneration  on the left. No compressive canal stenosis. Mild bilateral foraminal narrowing. C5-6: Spondylosis with endplate osteophytes and bulging of the disc. Foraminal encroachment by osteophytes right more than left. The right C6 nerve could be affected. C6-7: Spondylosis with endplate osteophytes and bulging of the disc. No compressive central canal stenosis. Mild osteophytic encroachment upon the foramina without likely compressive stenosis. C7-T1: Facet osteoarthritis on the right which could be painful. No central canal stenosis. Mild right foraminal stenosis. Presumably benign sclerotic focus within the left side of the T1 vertebral body. CT LUMBAR MYELOGRAM FINDINGS: L1-2: Advanced disc degeneration of vacuum phenomenon. Endplate osteophytes and bulging of the disc. Facet and ligamentous hypertrophy more on the right. Right lateral recess stenosis that could possibly cause neural compression. Findings at this level could certainly be associated with back pain. L2-3: Solid fusion posteriorly. No disc pathology. No canal or foraminal stenosis. L3-4: Solid fusion posteriorly. No disc pathology. Wide patency of the canal and foramina. Nerve root clumping suggesting arachnoiditis. L4 to sacrum: Chronic solid fusion. Wide patency of the canal and foramina. Chronic screw fracture on the right at S1 but without evidence of regional motion. Some clumping of the nerve roots suggesting a degree of arachnoiditis. CT THORACIC MYELOGRAM FINDINGS: Mild curvature convex to the right. No antero or retrolisthesis. Neuro stimulators in place in the dorsal spinal canal from the T6-7 disc level to the T9-10 disc level. No significant finding at T7-8 or above. No disc bulge or herniation. Ordinary upper thoracic facet osteoarthritis. T8-9: Disc bulge. No compressive stenosis. Mild facet degeneration. T9-10: Pronounced disc degeneration with loss of disc height and sclerotic endplate changes. Mild bulging of the disc. Mild narrowing of the  lateral recesses. Bilateral foraminal stenosis. Findings at this level could be symptomatic. T10-11: Disc degeneration with a left posterolateral to foraminal disc herniation. This indents the thecal sac. Left-sided neural compression possible at this level. T11-12: Mild bulging of the disc.  No canal or foraminal stenosis. T12-L1: Mild bulging of the disc. Mild facet and ligamentous hypertrophy. No compressive stenosis. IMPRESSION: Cervical region: Degenerative spondylosis from C3-4 through C6-7. No compressive central canal stenosis. Osteophytic encroachment could potentially cause neural compression on the right at C5-6. Facet arthropathy on the right at C7-T1 with foraminal encroachment that could possibly affect the right C8 nerve. Foraminal narrowing at other levels appears mild to moderate at most without likely neural compression. Thoracic region: Neuro stimulators appear well positioned. Advanced degenerative spondylosis at T9-10 with sclerotic bone changes. Mild narrowing of the lateral recesses. Foraminal narrowing that could be symptomatic. T10-11 left posterolateral to foraminal disc herniation which could cause left-sided neural compression. Lumbar region: Solid fusion from L2 to the sacrum with wide patency of the canal and foramina. Arachnoiditis pattern is present. Adjacent segment degenerative changes at L1-2 with degenerative spondylosis showing disc narrowing, vacuum phenomenon and chronic endplate changes. Bulging of the disc and facet osteoarthritis. Lateral recess narrowing on the right which could possibly cause neural compression. The findings at this level could be associated with lumbago. Electronically Signed   By: Nelson Chimes M.D.   On: 03/31/2018 10:57    Antibiotics:  Anti-infectives (From admission, onward)   Start     Dose/Rate Route Frequency Ordered Stop   04/25/18 0600  ceFAZolin (ANCEF) IVPB 2g/100 mL premix  Status:  Discontinued     2 g 200 mL/hr over 30 Minutes  Intravenous On call to O.R. 04/24/18 1811 04/25/18 0001   04/25/18 0300  ceFAZolin (ANCEF) IVPB 2g/100 mL premix     2 g 200 mL/hr over 30 Minutes Intravenous Every 8 hours 04/25/18 0009 04/25/18 1135   04/24/18 2128  bacitracin 50,000 Units in sodium chloride 0.9 % 500 mL irrigation  Status:  Discontinued       As needed 04/24/18 2129 04/24/18 2228   04/24/18 1813  ceFAZolin (ANCEF) 2-4 GM/100ML-% IVPB    Note to Pharmacy:  [facility]   : cabinet override      04/24/18 1813 04/25/18 0614      Discharge Exam: Blood pressure (!) 97/53, pulse 80, temperature 97.6 F (36.4 C), temperature source Oral, resp. rate 18, height 5\' 4"  (1.626 m), weight 100 kg, SpO2 93 %. Neurologic: Grossly normal Ambulating and voiding well  Discharge Medications:   Allergies as of 04/26/2018   No Known Allergies     Medication List    TAKE these medications   cyclobenzaprine 5 MG tablet Commonly known as:  FLEXERIL Take 1 tablet (5 mg total) by mouth 3 (three) times daily as needed for muscle spasms.   diclofenac sodium 1 % Gel Commonly known as:  VOLTAREN Apply 2 g topically 4 (four) times daily. What changed:    when to take this  reasons to take this   gabapentin 300 MG capsule Commonly known as:  NEURONTIN Take 300 mg by mouth 3 (three) times daily.   levothyroxine 75 MCG tablet Commonly known as:  SYNTHROID, LEVOTHROID Take 75 mcg by mouth daily before breakfast.   lisinopril 40 MG tablet Commonly known as:  PRINIVIL,ZESTRIL Take 40 mg by mouth daily.   methocarbamol 500 MG tablet Commonly known as:  ROBAXIN Take 1 tablet (500 mg total) by mouth every 6 (six) hours as needed for muscle spasms.   Oxycodone HCl 10 MG Tabs Take 10 mg by mouth every 8 (eight) hours. What changed:  Another medication with the same name was added. Make sure you understand how  and when to take each.   Oxycodone HCl 10 MG Tabs Take 1 tablet (10 mg total) by mouth every 4 (four) hours as needed  for severe pain ((score 7 to 10)). What changed:  You were already taking a medication with the same name, and this prescription was added. Make sure you understand how and when to take each.   pantoprazole 40 MG tablet Commonly known as:  PROTONIX Take 40 mg by mouth daily.       Disposition: home   Final Dx: T10-T11 microdiskectomy  Discharge Instructions    Call MD for:  difficulty breathing, headache or visual disturbances   Complete by:  As directed    Call MD for:  extreme fatigue   Complete by:  As directed    Call MD for:  hives   Complete by:  As directed    Call MD for:  persistant dizziness or light-headedness   Complete by:  As directed    Call MD for:  persistant nausea and vomiting   Complete by:  As directed    Call MD for:  redness, tenderness, or signs of infection (pain, swelling, redness, odor or green/yellow discharge around incision site)   Complete by:  As directed    Call MD for:  severe uncontrolled pain   Complete by:  As directed    Call MD for:  temperature >100.4   Complete by:  As directed    Diet - low sodium heart healthy   Complete by:  As directed    Driving Restrictions   Complete by:  As directed    No driving for 2 weeks, no riding in the car for 1 week   Face-to-face encounter (required for Medicare/Medicaid patients)   Complete by:  As directed    I Eleonore Chiquito certify that this patient is under my care and that I, or a nurse practitioner or physician's assistant working with me, had a face-to-face encounter that meets the physician face-to-face encounter requirements with this patient on 04/26/2018. The encounter with the patient was in whole, or in part for the following medical condition(s) which is the primary reason for home health care (List medical condition):postop spine surgery   The encounter with the patient was in whole, or in part, for the following medical condition, which is the primary reason for home health care:   postop spine surgery   I certify that, based on my findings, the following services are medically necessary home health services:  Physical therapy   Reason for Medically Necessary Home Health Services:  Therapy- Personnel officer, Public librarian   My clinical findings support the need for the above services:  Pain interferes with ambulation/mobility   Further, I certify that my clinical findings support that this patient is homebound due to:  Pain interferes with ambulation/mobility   Home Health   Complete by:  As directed    To provide the following care/treatments:  PT   Incentive spirometry RT   Complete by:  As directed    Increase activity slowly   Complete by:  As directed    Lifting restrictions   Complete by:  As directed    No lifting more than 8 lbs         Signed: Ocie Cornfield Dyquan Minks 04/26/2018, 8:28 AM

## 2018-04-26 NOTE — Progress Notes (Signed)
Occupational Therapy Treatment Patient Details Name: Madison Jennings MRN: [id number] DOB: October 02, 1940 Today's Date: 04/26/2018    History of present illness  Pt is a 78 yr old female who had T10-T11 left laminotomy, microdiscectomy and removal of expired spinal cord stimulator. PMH: arthritis, chronic back pain, GERD, HTN, joint pain, obesity, back sx x 3   OT comments  Pt performing LB dressing seated at EOB with hip kit provided for OTR. Pt able to perform LB dressing with modified in dependence with kit. Pt recalls having a few items, but not all of them. Pt ambulating in room with [facility] and performing transfers with minguardA. Spouse in room, back precautions reviewed requiring cues for all precautions. Pt would benefit from continued OT skilled services for ADL, mobility and safety in [place] setting. OT to follow acutely.    Follow Up Recommendations  Supervision/Assistance - 24 hour    Equipment Recommendations       Recommendations for Other Services      Precautions / Restrictions Precautions Precautions: Back Precaution Booklet Issued: Yes (comment) Precaution Comments: Pt able to recall 2/3 back precautions.  Educated pt and spouse on precautions especailly in relation to tasks at home  Restrictions Weight Bearing Restrictions: No       Mobility Bed Mobility               General bed mobility comments: at EOB on arrival  Transfers Overall transfer level: Needs assistance Equipment used: Straight cane Transfers: Sit to/from Stand Sit to Stand: Supervision              Balance Overall balance assessment: Needs assistance   Sitting balance-Leahy Scale: Fair       Standing balance-Leahy Scale: Fair                             ADL either performed or assessed with clinical judgement   ADL Overall ADL's : Needs assistance/impaired                 Upper Body Dressing : Set up;Cueing for safety;Cueing for sequencing;With adaptive  equipment   Lower Body Dressing: Set up;Cueing for safety;Cueing for sequencing;Cueing for back precautions;With adaptive equipment   Toilet Transfer: Min guard;RW   Toileting- Clothing Manipulation and Hygiene: Supervision/safety;Sitting/lateral lean;Sit to/from stand       Functional mobility during ADLs: Min guard;Rolling walker General ADL Comments: Requires assist to reduce arching back.     Vision       Perception     Praxis      Cognition Arousal/Alertness: Awake/alert Behavior During Therapy: WFL for tasks assessed/performed Overall Cognitive Status: Within Functional Limits for tasks assessed                                          Exercises     Shoulder Instructions       General Comments spouse present    Pertinent Vitals/ Pain       Pain Assessment: 0-10 Pain Score: 5  Pain Location: mid back Pain Intervention(s): Limited activity within patient's tolerance  Home Living                                          Prior Functioning/Environment  Frequency  Min 2X/week        Progress Toward Goals  OT Goals(current goals can now be found in the care plan section)     Acute Rehab OT Goals Patient Stated Goal: to go home and get back to fishing   Plan      Co-evaluation                 AM-PAC OT "6 Clicks" Daily Activity     Outcome Measure   Help from another person eating meals?: None Help from another person taking care of personal grooming?: None Help from another person toileting, which includes using toliet, bedpan, or urinal?: A Little Help from another person bathing (including washing, rinsing, drying)?: A Little Help from another person to put on and taking off regular upper body clothing?: A Little Help from another person to put on and taking off regular lower body clothing?: A Lot 6 Click Score: 19    End of Session Equipment Utilized During Treatment: Gait  belt;Rolling walker  OT Visit Diagnosis: Unsteadiness on feet (R26.81);Muscle weakness (generalized) (M62.81)   Activity Tolerance Patient limited by fatigue;Patient limited by pain   Patient Left in chair;with call bell/phone within reach   Nurse Communication          Time: 6722-7737 OT Time Calculation (min): 14 min  Charges: OT General Charges $OT Visit: 1 Visit OT Treatments $Self Care/Home Management : 8-22 mins  Darryl Nestle) Marsa Aris OTR/L Acute Rehabilitation Services Pager: [phone] Office: [phone]   Fredda Hammed 04/26/2018, 5:10 PM

## 2018-04-27 ENCOUNTER — Encounter (HOSPITAL_COMMUNITY): Payer: Self-pay | Admitting: Neurological Surgery

## 2018-04-27 DIAGNOSIS — Z472 Encounter for removal of internal fixation device: Secondary | ICD-10-CM | POA: Diagnosis not present

## 2018-04-27 DIAGNOSIS — M519 Unspecified thoracic, thoracolumbar and lumbosacral intervertebral disc disorder: Secondary | ICD-10-CM | POA: Diagnosis not present

## 2018-04-27 DIAGNOSIS — M5020 Other cervical disc displacement, unspecified cervical region: Secondary | ICD-10-CM | POA: Diagnosis not present

## 2018-04-27 DIAGNOSIS — M47814 Spondylosis without myelopathy or radiculopathy, thoracic region: Secondary | ICD-10-CM | POA: Diagnosis not present

## 2018-04-27 DIAGNOSIS — Z981 Arthrodesis status: Secondary | ICD-10-CM | POA: Diagnosis not present

## 2018-04-27 DIAGNOSIS — I700 Atherosclerosis of aorta: Secondary | ICD-10-CM | POA: Diagnosis not present

## 2018-04-27 DIAGNOSIS — M48061 Spinal stenosis, lumbar region without neurogenic claudication: Secondary | ICD-10-CM | POA: Diagnosis not present

## 2018-04-27 DIAGNOSIS — Z4889 Encounter for other specified surgical aftercare: Secondary | ICD-10-CM | POA: Diagnosis not present

## 2018-04-27 DIAGNOSIS — M5136 Other intervertebral disc degeneration, lumbar region: Secondary | ICD-10-CM | POA: Diagnosis not present

## 2018-05-01 DIAGNOSIS — Z4889 Encounter for other specified surgical aftercare: Secondary | ICD-10-CM | POA: Diagnosis not present

## 2018-05-01 DIAGNOSIS — M47814 Spondylosis without myelopathy or radiculopathy, thoracic region: Secondary | ICD-10-CM | POA: Diagnosis not present

## 2018-05-01 DIAGNOSIS — M5020 Other cervical disc displacement, unspecified cervical region: Secondary | ICD-10-CM | POA: Diagnosis not present

## 2018-05-01 DIAGNOSIS — Z981 Arthrodesis status: Secondary | ICD-10-CM | POA: Diagnosis not present

## 2018-05-01 DIAGNOSIS — M519 Unspecified thoracic, thoracolumbar and lumbosacral intervertebral disc disorder: Secondary | ICD-10-CM | POA: Diagnosis not present

## 2018-05-01 DIAGNOSIS — Z472 Encounter for removal of internal fixation device: Secondary | ICD-10-CM | POA: Diagnosis not present

## 2018-05-01 DIAGNOSIS — M48061 Spinal stenosis, lumbar region without neurogenic claudication: Secondary | ICD-10-CM | POA: Diagnosis not present

## 2018-05-01 DIAGNOSIS — M5136 Other intervertebral disc degeneration, lumbar region: Secondary | ICD-10-CM | POA: Diagnosis not present

## 2018-05-01 DIAGNOSIS — I700 Atherosclerosis of aorta: Secondary | ICD-10-CM | POA: Diagnosis not present

## 2018-05-05 DIAGNOSIS — M5020 Other cervical disc displacement, unspecified cervical region: Secondary | ICD-10-CM | POA: Diagnosis not present

## 2018-05-05 DIAGNOSIS — I700 Atherosclerosis of aorta: Secondary | ICD-10-CM | POA: Diagnosis not present

## 2018-05-05 DIAGNOSIS — M47814 Spondylosis without myelopathy or radiculopathy, thoracic region: Secondary | ICD-10-CM | POA: Diagnosis not present

## 2018-05-05 DIAGNOSIS — M48061 Spinal stenosis, lumbar region without neurogenic claudication: Secondary | ICD-10-CM | POA: Diagnosis not present

## 2018-05-05 DIAGNOSIS — M5136 Other intervertebral disc degeneration, lumbar region: Secondary | ICD-10-CM | POA: Diagnosis not present

## 2018-05-05 DIAGNOSIS — M519 Unspecified thoracic, thoracolumbar and lumbosacral intervertebral disc disorder: Secondary | ICD-10-CM | POA: Diagnosis not present

## 2018-05-05 DIAGNOSIS — Z472 Encounter for removal of internal fixation device: Secondary | ICD-10-CM | POA: Diagnosis not present

## 2018-05-05 DIAGNOSIS — Z981 Arthrodesis status: Secondary | ICD-10-CM | POA: Diagnosis not present

## 2018-05-05 DIAGNOSIS — Z4889 Encounter for other specified surgical aftercare: Secondary | ICD-10-CM | POA: Diagnosis not present

## 2018-05-06 ENCOUNTER — Encounter (HOSPITAL_COMMUNITY): Payer: Self-pay | Admitting: Neurological Surgery

## 2018-05-06 MED ORDER — THROMBIN 5000 UNITS EX SOLR
CUTANEOUS | Status: DC | PRN
  Administered 2018-04-24: 5000 [IU] via TOPICAL

## 2018-05-07 DIAGNOSIS — M519 Unspecified thoracic, thoracolumbar and lumbosacral intervertebral disc disorder: Secondary | ICD-10-CM | POA: Diagnosis not present

## 2018-05-07 DIAGNOSIS — Z4889 Encounter for other specified surgical aftercare: Secondary | ICD-10-CM | POA: Diagnosis not present

## 2018-05-07 DIAGNOSIS — Z981 Arthrodesis status: Secondary | ICD-10-CM | POA: Diagnosis not present

## 2018-05-07 DIAGNOSIS — M47814 Spondylosis without myelopathy or radiculopathy, thoracic region: Secondary | ICD-10-CM | POA: Diagnosis not present

## 2018-05-07 DIAGNOSIS — I700 Atherosclerosis of aorta: Secondary | ICD-10-CM | POA: Diagnosis not present

## 2018-05-07 DIAGNOSIS — M48061 Spinal stenosis, lumbar region without neurogenic claudication: Secondary | ICD-10-CM | POA: Diagnosis not present

## 2018-05-07 DIAGNOSIS — Z472 Encounter for removal of internal fixation device: Secondary | ICD-10-CM | POA: Diagnosis not present

## 2018-05-07 DIAGNOSIS — M5020 Other cervical disc displacement, unspecified cervical region: Secondary | ICD-10-CM | POA: Diagnosis not present

## 2018-05-07 DIAGNOSIS — M5136 Other intervertebral disc degeneration, lumbar region: Secondary | ICD-10-CM | POA: Diagnosis not present

## 2018-05-12 DIAGNOSIS — Z981 Arthrodesis status: Secondary | ICD-10-CM | POA: Diagnosis not present

## 2018-05-12 DIAGNOSIS — M519 Unspecified thoracic, thoracolumbar and lumbosacral intervertebral disc disorder: Secondary | ICD-10-CM | POA: Diagnosis not present

## 2018-05-12 DIAGNOSIS — M48061 Spinal stenosis, lumbar region without neurogenic claudication: Secondary | ICD-10-CM | POA: Diagnosis not present

## 2018-05-12 DIAGNOSIS — M47814 Spondylosis without myelopathy or radiculopathy, thoracic region: Secondary | ICD-10-CM | POA: Diagnosis not present

## 2018-05-12 DIAGNOSIS — M5136 Other intervertebral disc degeneration, lumbar region: Secondary | ICD-10-CM | POA: Diagnosis not present

## 2018-05-12 DIAGNOSIS — Z472 Encounter for removal of internal fixation device: Secondary | ICD-10-CM | POA: Diagnosis not present

## 2018-05-12 DIAGNOSIS — I700 Atherosclerosis of aorta: Secondary | ICD-10-CM | POA: Diagnosis not present

## 2018-05-12 DIAGNOSIS — Z4889 Encounter for other specified surgical aftercare: Secondary | ICD-10-CM | POA: Diagnosis not present

## 2018-05-12 DIAGNOSIS — M5020 Other cervical disc displacement, unspecified cervical region: Secondary | ICD-10-CM | POA: Diagnosis not present

## 2018-05-15 DIAGNOSIS — I700 Atherosclerosis of aorta: Secondary | ICD-10-CM | POA: Diagnosis not present

## 2018-05-15 DIAGNOSIS — M47814 Spondylosis without myelopathy or radiculopathy, thoracic region: Secondary | ICD-10-CM | POA: Diagnosis not present

## 2018-05-15 DIAGNOSIS — Z981 Arthrodesis status: Secondary | ICD-10-CM | POA: Diagnosis not present

## 2018-05-15 DIAGNOSIS — M5020 Other cervical disc displacement, unspecified cervical region: Secondary | ICD-10-CM | POA: Diagnosis not present

## 2018-05-15 DIAGNOSIS — Z472 Encounter for removal of internal fixation device: Secondary | ICD-10-CM | POA: Diagnosis not present

## 2018-05-15 DIAGNOSIS — M519 Unspecified thoracic, thoracolumbar and lumbosacral intervertebral disc disorder: Secondary | ICD-10-CM | POA: Diagnosis not present

## 2018-05-15 DIAGNOSIS — M5136 Other intervertebral disc degeneration, lumbar region: Secondary | ICD-10-CM | POA: Diagnosis not present

## 2018-05-15 DIAGNOSIS — M48061 Spinal stenosis, lumbar region without neurogenic claudication: Secondary | ICD-10-CM | POA: Diagnosis not present

## 2018-05-15 DIAGNOSIS — Z4889 Encounter for other specified surgical aftercare: Secondary | ICD-10-CM | POA: Diagnosis not present

## 2018-05-18 DIAGNOSIS — M545 Low back pain: Secondary | ICD-10-CM | POA: Diagnosis not present

## 2018-05-18 DIAGNOSIS — G8929 Other chronic pain: Secondary | ICD-10-CM | POA: Diagnosis not present

## 2018-05-18 DIAGNOSIS — G8921 Chronic pain due to trauma: Secondary | ICD-10-CM | POA: Diagnosis not present

## 2018-05-19 DIAGNOSIS — M5136 Other intervertebral disc degeneration, lumbar region: Secondary | ICD-10-CM | POA: Diagnosis not present

## 2018-05-19 DIAGNOSIS — Z981 Arthrodesis status: Secondary | ICD-10-CM | POA: Diagnosis not present

## 2018-05-19 DIAGNOSIS — M5020 Other cervical disc displacement, unspecified cervical region: Secondary | ICD-10-CM | POA: Diagnosis not present

## 2018-05-19 DIAGNOSIS — M47814 Spondylosis without myelopathy or radiculopathy, thoracic region: Secondary | ICD-10-CM | POA: Diagnosis not present

## 2018-05-19 DIAGNOSIS — Z472 Encounter for removal of internal fixation device: Secondary | ICD-10-CM | POA: Diagnosis not present

## 2018-05-19 DIAGNOSIS — I700 Atherosclerosis of aorta: Secondary | ICD-10-CM | POA: Diagnosis not present

## 2018-05-19 DIAGNOSIS — Z4889 Encounter for other specified surgical aftercare: Secondary | ICD-10-CM | POA: Diagnosis not present

## 2018-05-19 DIAGNOSIS — M519 Unspecified thoracic, thoracolumbar and lumbosacral intervertebral disc disorder: Secondary | ICD-10-CM | POA: Diagnosis not present

## 2018-05-19 DIAGNOSIS — M48061 Spinal stenosis, lumbar region without neurogenic claudication: Secondary | ICD-10-CM | POA: Diagnosis not present

## 2018-05-21 DIAGNOSIS — I700 Atherosclerosis of aorta: Secondary | ICD-10-CM | POA: Diagnosis not present

## 2018-05-21 DIAGNOSIS — M519 Unspecified thoracic, thoracolumbar and lumbosacral intervertebral disc disorder: Secondary | ICD-10-CM | POA: Diagnosis not present

## 2018-05-21 DIAGNOSIS — M48061 Spinal stenosis, lumbar region without neurogenic claudication: Secondary | ICD-10-CM | POA: Diagnosis not present

## 2018-05-21 DIAGNOSIS — M5136 Other intervertebral disc degeneration, lumbar region: Secondary | ICD-10-CM | POA: Diagnosis not present

## 2018-05-21 DIAGNOSIS — M47814 Spondylosis without myelopathy or radiculopathy, thoracic region: Secondary | ICD-10-CM | POA: Diagnosis not present

## 2018-05-21 DIAGNOSIS — M5020 Other cervical disc displacement, unspecified cervical region: Secondary | ICD-10-CM | POA: Diagnosis not present

## 2018-05-21 DIAGNOSIS — Z472 Encounter for removal of internal fixation device: Secondary | ICD-10-CM | POA: Diagnosis not present

## 2018-05-21 DIAGNOSIS — Z981 Arthrodesis status: Secondary | ICD-10-CM | POA: Diagnosis not present

## 2018-05-21 DIAGNOSIS — Z4889 Encounter for other specified surgical aftercare: Secondary | ICD-10-CM | POA: Diagnosis not present

## 2018-06-15 DIAGNOSIS — G8921 Chronic pain due to trauma: Secondary | ICD-10-CM | POA: Diagnosis not present

## 2018-06-15 DIAGNOSIS — G8929 Other chronic pain: Secondary | ICD-10-CM | POA: Diagnosis not present

## 2018-06-15 DIAGNOSIS — Z79899 Other long term (current) drug therapy: Secondary | ICD-10-CM | POA: Diagnosis not present

## 2018-06-15 DIAGNOSIS — M961 Postlaminectomy syndrome, not elsewhere classified: Secondary | ICD-10-CM | POA: Diagnosis not present

## 2018-06-15 DIAGNOSIS — M545 Low back pain: Secondary | ICD-10-CM | POA: Diagnosis not present

## 2018-06-26 DIAGNOSIS — I10 Essential (primary) hypertension: Secondary | ICD-10-CM | POA: Diagnosis not present

## 2018-06-26 DIAGNOSIS — K219 Gastro-esophageal reflux disease without esophagitis: Secondary | ICD-10-CM | POA: Diagnosis not present

## 2018-06-26 DIAGNOSIS — M961 Postlaminectomy syndrome, not elsewhere classified: Secondary | ICD-10-CM | POA: Diagnosis not present

## 2018-07-01 DIAGNOSIS — K219 Gastro-esophageal reflux disease without esophagitis: Secondary | ICD-10-CM | POA: Diagnosis not present

## 2018-07-01 DIAGNOSIS — R1013 Epigastric pain: Secondary | ICD-10-CM | POA: Diagnosis not present

## 2018-07-06 DIAGNOSIS — J309 Allergic rhinitis, unspecified: Secondary | ICD-10-CM | POA: Diagnosis not present

## 2018-07-06 DIAGNOSIS — M545 Low back pain: Secondary | ICD-10-CM | POA: Diagnosis not present

## 2018-07-06 DIAGNOSIS — G8929 Other chronic pain: Secondary | ICD-10-CM | POA: Diagnosis not present

## 2018-07-24 DIAGNOSIS — K921 Melena: Secondary | ICD-10-CM | POA: Diagnosis not present

## 2018-07-24 DIAGNOSIS — R5381 Other malaise: Secondary | ICD-10-CM | POA: Diagnosis not present

## 2018-07-24 DIAGNOSIS — R12 Heartburn: Secondary | ICD-10-CM | POA: Diagnosis not present

## 2018-07-24 DIAGNOSIS — K219 Gastro-esophageal reflux disease without esophagitis: Secondary | ICD-10-CM | POA: Diagnosis not present

## 2018-07-24 DIAGNOSIS — I119 Hypertensive heart disease without heart failure: Secondary | ICD-10-CM | POA: Diagnosis not present

## 2018-07-27 DIAGNOSIS — K219 Gastro-esophageal reflux disease without esophagitis: Secondary | ICD-10-CM | POA: Diagnosis not present

## 2018-07-27 DIAGNOSIS — I119 Hypertensive heart disease without heart failure: Secondary | ICD-10-CM | POA: Diagnosis not present

## 2018-07-27 DIAGNOSIS — M961 Postlaminectomy syndrome, not elsewhere classified: Secondary | ICD-10-CM | POA: Diagnosis not present

## 2018-07-30 DIAGNOSIS — I10 Essential (primary) hypertension: Secondary | ICD-10-CM | POA: Diagnosis not present

## 2018-07-30 DIAGNOSIS — R002 Palpitations: Secondary | ICD-10-CM | POA: Diagnosis not present

## 2018-08-04 DIAGNOSIS — I119 Hypertensive heart disease without heart failure: Secondary | ICD-10-CM | POA: Diagnosis not present

## 2018-08-04 DIAGNOSIS — I10 Essential (primary) hypertension: Secondary | ICD-10-CM | POA: Diagnosis not present

## 2018-08-10 DIAGNOSIS — M961 Postlaminectomy syndrome, not elsewhere classified: Secondary | ICD-10-CM | POA: Diagnosis not present

## 2018-08-10 DIAGNOSIS — G8929 Other chronic pain: Secondary | ICD-10-CM | POA: Diagnosis not present

## 2018-08-10 DIAGNOSIS — M545 Low back pain: Secondary | ICD-10-CM | POA: Diagnosis not present

## 2018-08-11 ENCOUNTER — Other Ambulatory Visit: Payer: Self-pay | Admitting: Internal Medicine

## 2018-08-14 ENCOUNTER — Other Ambulatory Visit: Payer: Self-pay | Admitting: Internal Medicine

## 2018-08-14 DIAGNOSIS — Z1231 Encounter for screening mammogram for malignant neoplasm of breast: Secondary | ICD-10-CM

## 2018-08-17 DIAGNOSIS — I10 Essential (primary) hypertension: Secondary | ICD-10-CM | POA: Diagnosis not present

## 2018-08-17 DIAGNOSIS — M545 Low back pain: Secondary | ICD-10-CM | POA: Diagnosis not present

## 2018-09-03 DIAGNOSIS — I119 Hypertensive heart disease without heart failure: Secondary | ICD-10-CM | POA: Diagnosis not present

## 2018-09-03 DIAGNOSIS — I10 Essential (primary) hypertension: Secondary | ICD-10-CM | POA: Diagnosis not present

## 2018-09-14 DIAGNOSIS — K219 Gastro-esophageal reflux disease without esophagitis: Secondary | ICD-10-CM | POA: Diagnosis not present

## 2018-09-14 DIAGNOSIS — M545 Low back pain: Secondary | ICD-10-CM | POA: Diagnosis not present

## 2018-09-14 DIAGNOSIS — R002 Palpitations: Secondary | ICD-10-CM | POA: Diagnosis not present

## 2018-09-17 DIAGNOSIS — K219 Gastro-esophageal reflux disease without esophagitis: Secondary | ICD-10-CM | POA: Diagnosis not present

## 2018-09-17 DIAGNOSIS — I119 Hypertensive heart disease without heart failure: Secondary | ICD-10-CM | POA: Diagnosis not present

## 2018-09-17 DIAGNOSIS — R002 Palpitations: Secondary | ICD-10-CM | POA: Diagnosis not present

## 2018-09-23 ENCOUNTER — Other Ambulatory Visit: Payer: Self-pay

## 2018-09-23 ENCOUNTER — Ambulatory Visit
Admission: RE | Admit: 2018-09-23 | Discharge: 2018-09-23 | Disposition: A | Discharge status: Home / Self Care | Payer: Medicare Other | Source: Ambulatory Visit | Attending: Internal Medicine | Admitting: Internal Medicine

## 2018-09-23 DIAGNOSIS — Z1231 Encounter for screening mammogram for malignant neoplasm of breast: Secondary | ICD-10-CM | POA: Diagnosis not present

## 2018-09-24 DIAGNOSIS — I119 Hypertensive heart disease without heart failure: Secondary | ICD-10-CM | POA: Diagnosis not present

## 2018-09-24 DIAGNOSIS — R002 Palpitations: Secondary | ICD-10-CM | POA: Diagnosis not present

## 2018-09-28 DIAGNOSIS — R002 Palpitations: Secondary | ICD-10-CM | POA: Diagnosis not present

## 2018-09-28 DIAGNOSIS — E890 Postprocedural hypothyroidism: Secondary | ICD-10-CM | POA: Diagnosis not present

## 2018-10-20 DIAGNOSIS — R202 Paresthesia of skin: Secondary | ICD-10-CM | POA: Diagnosis not present

## 2018-10-20 DIAGNOSIS — E039 Hypothyroidism, unspecified: Secondary | ICD-10-CM | POA: Diagnosis not present

## 2018-10-20 DIAGNOSIS — I10 Essential (primary) hypertension: Secondary | ICD-10-CM | POA: Diagnosis not present

## 2018-11-11 ENCOUNTER — Other Ambulatory Visit: Payer: Self-pay

## 2018-11-11 ENCOUNTER — Encounter: Payer: Self-pay | Admitting: Emergency Medicine

## 2018-11-11 ENCOUNTER — Emergency Department
Admission: EM | Admit: 2018-11-11 | Discharge: 2018-11-11 | Disposition: A | Discharge status: Home / Self Care | Payer: Medicare Other | Attending: Emergency Medicine | Admitting: Emergency Medicine

## 2018-11-11 DIAGNOSIS — E039 Hypothyroidism, unspecified: Secondary | ICD-10-CM | POA: Diagnosis not present

## 2018-11-11 DIAGNOSIS — F419 Anxiety disorder, unspecified: Secondary | ICD-10-CM | POA: Diagnosis not present

## 2018-11-11 DIAGNOSIS — J45909 Unspecified asthma, uncomplicated: Secondary | ICD-10-CM | POA: Diagnosis not present

## 2018-11-11 DIAGNOSIS — I10 Essential (primary) hypertension: Secondary | ICD-10-CM | POA: Insufficient documentation

## 2018-11-11 DIAGNOSIS — G4700 Insomnia, unspecified: Secondary | ICD-10-CM | POA: Insufficient documentation

## 2018-11-11 DIAGNOSIS — Z87891 Personal history of nicotine dependence: Secondary | ICD-10-CM | POA: Diagnosis not present

## 2018-11-11 DIAGNOSIS — F329 Major depressive disorder, single episode, unspecified: Secondary | ICD-10-CM | POA: Insufficient documentation

## 2018-11-11 LAB — CBC WITH DIFFERENTIAL/PLATELET
Abs Immature Granulocytes: 0.02 10*3/uL (ref 0.00–0.07)
Basophils Absolute: 0 10*3/uL (ref 0.0–0.1)
Basophils Relative: 0 %
Eosinophils Absolute: 0.1 10*3/uL (ref 0.0–0.5)
Eosinophils Relative: 1 %
HCT: 44.7 % (ref 36.0–46.0)
Hemoglobin: 15.3 g/dL — ABNORMAL HIGH (ref 12.0–15.0)
Immature Granulocytes: 0 %
Lymphocytes Relative: 37 %
Lymphs Abs: 2.6 10*3/uL (ref 0.7–4.0)
MCH: 31.1 pg (ref 26.0–34.0)
MCHC: 34.2 g/dL (ref 30.0–36.0)
MCV: 90.9 fL (ref 80.0–100.0)
Monocytes Absolute: 0.5 10*3/uL (ref 0.1–1.0)
Monocytes Relative: 8 %
Neutro Abs: 3.8 10*3/uL (ref 1.7–7.7)
Neutrophils Relative %: 54 %
Platelets: 231 10*3/uL (ref 150–400)
RBC: 4.92 MIL/uL (ref 3.87–5.11)
RDW: 13.1 % (ref 11.5–15.5)
WBC: 7.1 10*3/uL (ref 4.0–10.5)
nRBC: 0 % (ref 0.0–0.2)

## 2018-11-11 LAB — URINALYSIS, COMPLETE (UACMP) WITH MICROSCOPIC
Bacteria, UA: NONE SEEN
Bilirubin Urine: NEGATIVE
Glucose, UA: NEGATIVE mg/dL
Hgb urine dipstick: NEGATIVE
Ketones, ur: NEGATIVE mg/dL
Leukocytes,Ua: NEGATIVE
Nitrite: NEGATIVE
Protein, ur: NEGATIVE mg/dL
Specific Gravity, Urine: 1.004 — ABNORMAL LOW (ref 1.005–1.030)
pH: 7 (ref 5.0–8.0)

## 2018-11-11 LAB — COMPREHENSIVE METABOLIC PANEL
ALT: 15 U/L (ref 0–44)
AST: 18 U/L (ref 15–41)
Albumin: 4.5 g/dL (ref 3.5–5.0)
Alkaline Phosphatase: 92 U/L (ref 38–126)
Anion gap: 12 (ref 5–15)
BUN: 12 mg/dL (ref 8–23)
CO2: 23 mmol/L (ref 22–32)
Calcium: 10 mg/dL (ref 8.9–10.3)
Chloride: 103 mmol/L (ref 98–111)
Creatinine, Ser: 1.05 mg/dL — ABNORMAL HIGH (ref 0.44–1.00)
GFR calc Af Amer: 60 mL/min — ABNORMAL LOW (ref >60)
GFR calc non Af Amer: 52 mL/min — ABNORMAL LOW (ref >60)
Glucose, Bld: 123 mg/dL — ABNORMAL HIGH (ref 70–99)
Potassium: 4.1 mmol/L (ref 3.5–5.1)
Sodium: 138 mmol/L (ref 135–145)
Total Bilirubin: 0.8 mg/dL (ref 0.3–1.2)
Total Protein: 7.5 g/dL (ref 6.5–8.1)

## 2018-11-11 LAB — TSH: TSH: 2.56 u[IU]/mL (ref 0.350–4.500)

## 2018-11-11 LAB — T4, FREE: Free T4: 1.17 ng/dL — ABNORMAL HIGH (ref 0.61–1.12)

## 2018-11-11 MED ORDER — MIRTAZAPINE 15 MG PO TABS: 15.0000 mg | ORAL_TABLET | Freq: Every day | ORAL | Status: DC

## 2018-11-11 MED ORDER — LORAZEPAM 2 MG/ML IJ SOLN
1.0000 mg | Freq: Once | INTRAMUSCULAR | Status: AC
  Administered 2018-11-11: 1 mg via INTRAVENOUS
  Filled 2018-11-11: qty 1

## 2018-11-11 MED ORDER — MIRTAZAPINE 15 MG PO TABS: 15.0000 mg | ORAL_TABLET | Freq: Every day | ORAL | 0 refills | Status: DC

## 2018-11-11 MED ORDER — CITALOPRAM HYDROBROMIDE 10 MG PO TABS: 10.0000 mg | ORAL_TABLET | Freq: Every day | ORAL | 0 refills | Status: DC

## 2018-11-11 NOTE — ED Notes (Signed)
Pt reports that she is anxious and is requesting more ativan - will inform Dr Jimmye Norman of request - pt reports that psych told her they would return and let her know plan of care

## 2018-11-11 NOTE — ED Notes (Signed)
Patient updated on wait time. Patient took home medications that she typically takes at 0600.

## 2018-11-11 NOTE — Consult Note (Addendum)
[facility] Psychiatry Consult   Reason for Consult:  Anxiety Referring Physician:  Dr/ Issacs Patient Identification: Madison Jennings MRN:  [id number] Principal Diagnosis: <principal problem not specified> Diagnosis:  Active Problems:   * No active hospital problems. *   Total Time spent with patient: 1 hour  Subjective:   Madison Jennings is a 78 y.o. female patient admitted with worsening anxiety, tremors, panic attacks, and insomnia. SHe reports that she was started on Valium yesterday by her psychiatrist. Marlana Salvage was advissed to discontinue her CYmbalta, Citalopram and Xanax when starting this medication. SHe reports these medications were abruptly stopped as her provider suspected she had serotonin syndrome. She denies any history of depressive symptoms however endorses ongoing depressive symptoms that include anxiety, decrease appetite, insomnia, and most significant weight loss (40lb) over the past 3 months. SHe has no other identifying or contributaory factors for the weight loss. Her last TSH drawn was wnl range. Prior to her weight loss she discontinued her long term opiate medication when she developed anxiety. SHe reports never having any previous psych history and reports family history of her mother having " nervouse problems." SHe denies any suicidal thoughts and or homicidal thoughts, hallucinations at [facility] stime.   HPI:  Madison Jennings is a 78 y.o. female with a history of arthritis, asthma, fibromyalgia, gastritis, hyperlipidemia, hypertension, hypothyroidism who presents to the ED for severe anxiety.  According to the family she has had 4 to 5 months of worsening anxiety with recent worsening insomnia.  Patient states she does not sleep all night last night.  Patient states she feels like she can have a panic attack at any moment.  Currently she is on Valium 5 mg 3 times a day for anxiety but they stopped her Cymbalta.  She was on Xanax prior to the Valium.   Past Psychiatric  History: Denies  Risk to Self:  Denies Risk to Others:  Denies Prior Inpatient Therapy:  Denies Prior Outpatient Therapy:  Denies  Past Medical History:  Past Medical History:  Diagnosis Date  . Arthritis   . Asthma   . Chronic back pain   . Chronic back pain    scoliosis and 2 buldging disc  . Depression   . Diastolic dysfunction   . Diverticulosis   . Dysrhythmia    PALPITATIONS  . Fibromyalgia   . Gastritis   . GERD (gastroesophageal reflux disease)    takes Protonix daily  . Headache(784.0)    sinus related  . Hyperlipidemia    just diagnosed;will follow up in 6wks to discuss meds  . Hypertension    takes Lisinopril daily  . Hypothyroidism    takes Synthroid daily  . Ischemic colitis ([place])   . Joint pain   . Nocturia   . Obesity   . Vitamin D deficiency    just placed on Vit D this week    Past Surgical History:  Procedure Laterality Date  . ABDOMINAL HYSTERECTOMY    . APPENDECTOMY    . BACK SURGERY     x 3  . CARDIAC CATHETERIZATION  04/13/2010   normal cororanies, EF 55% ([facility]; Dr. Clayborn Bigness)  . CHOLECYSTECTOMY    . COLONOSCOPY    . COLONOSCOPY WITH PROPOFOL N/A 01/21/2017   Procedure: COLONOSCOPY WITH PROPOFOL;  Surgeon: Lollie Sails, MD;  Location: [facility] ENDOSCOPY;  Service: Endoscopy;  Laterality: N/A;  . CORONARY ANGIOPLASTY    . ESOPHAGOGASTRODUODENOSCOPY  06/12/2011  . ESOPHAGOGASTRODUODENOSCOPY (EGD) WITH PROPOFOL N/A 01/21/2017  Procedure: ESOPHAGOGASTRODUODENOSCOPY (EGD) WITH PROPOFOL;  Surgeon: Lollie Sails, MD;  Location: [facility] ENDOSCOPY;  Service: Endoscopy;  Laterality: N/A;  . FOOT SURGERY Left    d/t neuroma  . OOPHORECTOMY    . radiation to thyroid    . RIGHT/LEFT HEART CATH AND CORONARY ANGIOGRAPHY Bilateral 10/22/2016   Procedure: RIGHT/LEFT HEART CATH AND CORONARY ANGIOGRAPHY;  Surgeon: Yolonda Kida, MD;  Location: [place] CV LAB;  Service: Cardiovascular;  Laterality: Bilateral;  . SPINAL CORD STIMULATOR  INSERTION N/A 07/16/2013   Procedure: LUMBAR SPINAL CORD STIMULATOR INSERTION;  Surgeon: Bonna Gains, MD;  Location: [place];  Service: Neurosurgery;  Laterality: N/A;  . THORACIC DISCECTOMY Left 04/24/2018   Procedure: Left Thoracic ten- thoracic eleven Microdiscectomy, Removal of Spinal Cord Stimulator;  Surgeon: Kristeen Miss, MD;  Location: [place];  Service: Neurosurgery;  Laterality: Left;  Left Thoracic ten- thoracic eleven Microdiscectomy, Removal of Spinal Cord Stimulator   Family History:  Family History  Problem Relation Age of Onset  . Breast cancer Neg Hx    Family Psychiatric  History: Mother had anxiety Social History:  Social History   Substance and Sexual Activity  Alcohol Use No     Social History   Substance and Sexual Activity  Drug Use No    Social History   Socioeconomic History  . Marital status: Married    Spouse name: Not on file  . Number of children: Not on file  . Years of education: Not on file  . Highest education level: Not on file  Occupational History  . Not on file  Social Needs  . Financial resource strain: Not on file  . Food insecurity    Worry: Not on file    Inability: Not on file  . Transportation needs    Medical: Not on file    Non-medical: Not on file  Tobacco Use  . Smoking status: Former Smoker    Packs/day: 0.50    Years: 0.00    Pack years: 0.00    Types: Cigarettes    Quit date: 06/20/1986    Years since quitting: 32.4  . Smokeless tobacco: Never Used  Substance and Sexual Activity  . Alcohol use: No  . Drug use: No  . Sexual activity: Yes  Lifestyle  . Physical activity    Days per week: Not on file    Minutes per session: Not on file  . Stress: Not on file  Relationships  . Social Herbalist on phone: Not on file    Gets together: Not on file    Attends religious service: Not on file    Active member of club or organization: Not on file    Attends meetings of clubs or organizations: Not on file     Relationship status: Not on file  Other Topics Concern  . Not on file  Social History Narrative  . Not on file   Additional Social History:    Allergies:  No Known Allergies  Labs:  Results for orders placed or performed during the hospital encounter of 11/11/18 (from the past 48 hour(s))  CBC with Differential/Platelet     Status: Abnormal   Collection Time: 11/11/18  7:25 AM  Result Value Ref Range   WBC 7.1 4.0 - 10.5 K/uL   RBC 4.92 3.87 - 5.11 MIL/uL   Hemoglobin 15.3 (H) 12.0 - 15.0 g/dL   HCT 44.7 36.0 - 46.0 %   MCV 90.9 80.0 - 100.0 fL  MCH 31.1 26.0 - 34.0 pg   MCHC 34.2 30.0 - 36.0 g/dL   RDW 13.1 11.5 - 15.5 %   Platelets 231 150 - 400 K/uL   nRBC 0.0 0.0 - 0.2 %   Neutrophils Relative % 54 %   Neutro Abs 3.8 1.7 - 7.7 K/uL   Lymphocytes Relative 37 %   Lymphs Abs 2.6 0.7 - 4.0 K/uL   Monocytes Relative 8 %   Monocytes Absolute 0.5 0.1 - 1.0 K/uL   Eosinophils Relative 1 %   Eosinophils Absolute 0.1 0.0 - 0.5 K/uL   Basophils Relative 0 %   Basophils Absolute 0.0 0.0 - 0.1 K/uL   Immature Granulocytes 0 %   Abs Immature Granulocytes 0.02 0.00 - 0.07 K/uL    Comment: Performed at [facility], [address] [place], [place] [address]  Comprehensive metabolic panel     Status: Abnormal   Collection Time: 11/11/18  7:25 AM  Result Value Ref Range   Sodium 138 135 - 145 mmol/L   Potassium 4.1 3.5 - 5.1 mmol/L   Chloride 103 98 - 111 mmol/L   CO2 23 22 - 32 mmol/L   Glucose, Bld 123 (H) 70 - 99 mg/dL   BUN 12 8 - 23 mg/dL   Creatinine, Ser 1.05 (H) 0.44 - 1.00 mg/dL   Calcium 10.0 8.9 - 10.3 mg/dL   Total Protein 7.5 6.5 - 8.1 g/dL   Albumin 4.5 3.5 - 5.0 g/dL   AST 18 15 - 41 U/L   ALT 15 0 - 44 U/L   Alkaline Phosphatase 92 38 - 126 U/L   Total Bilirubin 0.8 0.3 - 1.2 mg/dL   GFR calc non Af Amer 52 (L) >60 mL/min   GFR calc Af Amer 60 (L) >60 mL/min   Anion gap 12 5 - 15    Comment: Performed at [facility], [address] [place], [place] [address]  Urinalysis, Complete w Microscopic     Status: Abnormal   Collection Time: 11/11/18  7:25 AM  Result Value Ref Range   Color, Urine STRAW (A) YELLOW   APPearance CLEAR (A) CLEAR   Specific Gravity, Urine 1.004 (L) 1.005 - 1.030   pH 7.0 5.0 - 8.0   Glucose, UA NEGATIVE NEGATIVE mg/dL   Hgb urine dipstick NEGATIVE NEGATIVE   Bilirubin Urine NEGATIVE NEGATIVE   Ketones, ur NEGATIVE NEGATIVE mg/dL   Protein, ur NEGATIVE NEGATIVE mg/dL   Nitrite NEGATIVE NEGATIVE   Leukocytes,Ua NEGATIVE NEGATIVE   WBC, UA 0-5 0 - 5 WBC/hpf   Bacteria, UA NONE SEEN NONE SEEN   Squamous Epithelial / LPF 0-5 0 - 5    Comment: Performed at [facility], [address] [place], [place] [address]  TSH     Status: None   Collection Time: 11/11/18  7:25 AM  Result Value Ref Range   TSH 2.560 0.350 - 4.500 uIU/mL    Comment: Performed by a 3rd Generation assay with a functional sensitivity of <=0.01 uIU/mL. Performed at [facility], [address] [place], [place] [address]   T4, free     Status: Abnormal   Collection Time: 11/11/18  7:25 AM  Result Value Ref Range   Free T4 1.17 (H) 0.61 - 1.12 ng/dL    Comment: (NOTE) Biotin ingestion may interfere with free T4 tests. If the results are inconsistent with the TSH level, previous test results, or the clinical presentation, then consider biotin interference. If  needed, order repeat testing after stopping biotin. Performed at [facility], [address] [place], [place] [address]     Current Facility-Administered Medications  Medication Dose Route Frequency Provider Last Rate Last Dose  . mirtazapine (REMERON) tablet 15 mg  15 mg Oral QHS Suella Broad, FNP       Current Outpatient Medications  Medication Sig Dispense Refill  . cloNIDine (CATAPRES) 0.1 MG tablet Take 0.1 mg by mouth at bedtime.    . diazepam (VALIUM) 5 MG tablet Take 5 mg by mouth 3 (three) times daily.    Marland Kitchen  levothyroxine (SYNTHROID, LEVOTHROID) 75 MCG tablet Take 75 mcg by mouth daily before breakfast.    . lisinopril (PRINIVIL,ZESTRIL) 40 MG tablet Take 40 mg by mouth daily.    . pantoprazole (PROTONIX) 40 MG tablet Take 40 mg by mouth daily.    . citalopram (CELEXA) 10 MG tablet Take 1 tablet (10 mg total) by mouth daily. 30 tablet 0  . mirtazapine (REMERON) 15 MG tablet Take 1 tablet (15 mg total) by mouth at bedtime. 30 tablet 0    Musculoskeletal: Strength & Muscle Tone: within normal limits Gait & Station: normal Patient leans: N/A  Psychiatric Specialty Exam: Physical Exam  ROS  Blood pressure (!) 163/61, pulse 92, temperature 98 F (36.7 C), temperature source Oral, resp. rate 20, height 5\' 5"  (1.651 m), weight 79.4 kg, SpO2 95 %.Body mass index is 29.12 kg/m.  General Appearance: Fairly Groomed  Eye Contact:  Fair  Speech:  Clear and Coherent and Normal Rate  Volume:  Normal  Mood:  Anxious  Affect:  Congruent  Thought Process:  Coherent, Linear and Descriptions of Associations: Intact  Orientation:  Full (Time, Place, and Person)  Thought Content:  Logical and Rumination  Suicidal Thoughts:  No  Homicidal Thoughts:  No  Memory:  Immediate;   Fair Recent;   Fair  Judgement:  Fair  Insight:  Fair  Psychomotor Activity:  Normal and Tremor  Concentration:  Concentration: Poor and Attention Span: Fair  Recall:  [employer] of Knowledge:  Fair  Language:  Fair  Akathisia:  No  Handed:  Right  AIMS (if indicated):     Assets:  Communication Skills Desire for Improvement Housing Physical Health Social Support Vocational/Educational  ADL's:  Intact  Cognition:  WNL  Sleep:        Treatment Plan Summary: Plan See discharge instructions. Will discontinue Amitripitline 25mg  at this time. Will start Mirtazapine 15mg  po qhs for depression, anxiety, stimulate appetite, and sedation. Will resume citalopram 10mg  . She is to contact her psychiatrist to get her appointment  moved.   Disposition: No evidence of imminent risk to self or others at present.   Patient does not meet criteria for psychiatric inpatient admission. Supportive therapy provided about ongoing stressors. Refer to IOP. Discussed crisis plan, support from social network, calling 911, coming to the Emergency Department, and calling Suicide Hotline. consider starting CBT for insomnia and pain management.   Suella Broad, FNP 11/11/2018 1:36 PM   Case discussed and plan agreed upon

## 2018-11-11 NOTE — ED Provider Notes (Signed)
[facility] Emergency Department Provider Note       Time seen: ----------------------------------------- 7:30 AM on 11/11/2018 -----------------------------------------   I have reviewed the triage vital signs and the nursing notes.  HISTORY   Chief Complaint Insomnia and Anxiety    HPI Madison Jennings is a 78 y.o. female with a history of arthritis, asthma, fibromyalgia, gastritis, hyperlipidemia, hypertension, hypothyroidism who presents to the ED for severe anxiety.  According to the family she has had 4 to 5 months of worsening anxiety with recent worsening insomnia.  Patient states she does not sleep all night last night.  Patient states she feels like she can have a panic attack at any moment.  Currently she is on Valium 5 mg 3 times a day for anxiety but they stopped her Cymbalta.  She was on Xanax prior to the Valium.  Past Medical History:  Diagnosis Date  . Arthritis   . Asthma   . Chronic back pain   . Chronic back pain    scoliosis and 2 buldging disc  . Depression   . Diastolic dysfunction   . Diverticulosis   . Dysrhythmia    PALPITATIONS  . Fibromyalgia   . Gastritis   . GERD (gastroesophageal reflux disease)    takes Protonix daily  . Headache(784.0)    sinus related  . Hyperlipidemia    just diagnosed;will follow up in 6wks to discuss meds  . Hypertension    takes Lisinopril daily  . Hypothyroidism    takes Synthroid daily  . Ischemic colitis ([place])   . Joint pain   . Nocturia   . Obesity   . Vitamin D deficiency    just placed on Vit D this week    Patient Active Problem List   Diagnosis Date Noted  . HNP (herniated nucleus pulposus), thoracic 04/24/2018  . Pain in joint, shoulder region 11/22/2013  . Shoulder weakness 11/22/2013  . Diarrhea 05/02/2011  . Generalized weakness 05/02/2011  . Dehydration 05/02/2011  . HTN (hypertension) 05/02/2011  . Hypothyroidism 05/02/2011  . GERD (gastroesophageal reflux disease)  05/02/2011    Past Surgical History:  Procedure Laterality Date  . ABDOMINAL HYSTERECTOMY    . APPENDECTOMY    . BACK SURGERY     x 3  . CARDIAC CATHETERIZATION  04/13/2010   normal cororanies, EF 55% ([facility]; Dr. Clayborn Bigness)  . CHOLECYSTECTOMY    . COLONOSCOPY    . COLONOSCOPY WITH PROPOFOL N/A 01/21/2017   Procedure: COLONOSCOPY WITH PROPOFOL;  Surgeon: Lollie Sails, MD;  Location: [facility] ENDOSCOPY;  Service: Endoscopy;  Laterality: N/A;  . CORONARY ANGIOPLASTY    . ESOPHAGOGASTRODUODENOSCOPY  06/12/2011  . ESOPHAGOGASTRODUODENOSCOPY (EGD) WITH PROPOFOL N/A 01/21/2017   Procedure: ESOPHAGOGASTRODUODENOSCOPY (EGD) WITH PROPOFOL;  Surgeon: Lollie Sails, MD;  Location: [facility] ENDOSCOPY;  Service: Endoscopy;  Laterality: N/A;  . FOOT SURGERY Left    d/t neuroma  . OOPHORECTOMY    . radiation to thyroid    . RIGHT/LEFT HEART CATH AND CORONARY ANGIOGRAPHY Bilateral 10/22/2016   Procedure: RIGHT/LEFT HEART CATH AND CORONARY ANGIOGRAPHY;  Surgeon: Yolonda Kida, MD;  Location: [place] CV LAB;  Service: Cardiovascular;  Laterality: Bilateral;  . SPINAL CORD STIMULATOR INSERTION N/A 07/16/2013   Procedure: LUMBAR SPINAL CORD STIMULATOR INSERTION;  Surgeon: Bonna Gains, MD;  Location: [place];  Service: Neurosurgery;  Laterality: N/A;  . THORACIC DISCECTOMY Left 04/24/2018   Procedure: Left Thoracic ten- thoracic eleven Microdiscectomy, Removal of Spinal Cord Stimulator;  Surgeon:  Kristeen Miss, MD;  Location: [place];  Service: Neurosurgery;  Laterality: Left;  Left Thoracic ten- thoracic eleven Microdiscectomy, Removal of Spinal Cord Stimulator    Allergies Patient has no known allergies.  Social History Social History   Tobacco Use  . Smoking status: Former Smoker    Packs/day: 0.50    Years: 0.00    Pack years: 0.00    Types: Cigarettes    Quit date: 06/20/1986    Years since quitting: 32.4  . Smokeless tobacco: Never Used  Substance Use Topics  . Alcohol use: No   . Drug use: No   Review of Systems Constitutional: Negative for fever. Cardiovascular: Negative for chest pain. Respiratory: Negative for shortness of breath. Gastrointestinal: Negative for abdominal pain, vomiting and diarrhea. Musculoskeletal: Negative for back pain. Skin: Negative for rash. Neurological: Negative for headaches, focal weakness or numbness. Psychiatric: Positive for severe anxiety, insomnia  All systems negative/normal/unremarkable except as stated in the HPI  ____________________________________________   PHYSICAL EXAM:  VITAL SIGNS: ED Triage Vitals  Enc Vitals Group     BP 11/11/18 0548 (!) 149/61     Pulse Rate 11/11/18 0548 72     Resp 11/11/18 0548 18     Temp 11/11/18 0548 98 F (36.7 C)     Temp Source 11/11/18 0548 Oral     SpO2 11/11/18 0548 98 %     Weight 11/11/18 0547 175 lb (79.4 kg)     Height 11/11/18 0547 5\' 5"  (1.651 m)     Head Circumference --      Peak Flow --      Pain Score 11/11/18 0544 0     Pain Loc --      Pain Edu? --      Excl. in [place]? --     Constitutional: Alert and oriented.  Anxious, no distress Eyes: Conjunctivae are normal. Normal extraocular movements. ENT      Head: Normocephalic and atraumatic.      Nose: No congestion/rhinnorhea.      Mouth/Throat: Mucous membranes are moist.      Neck: No stridor. Cardiovascular: Normal rate, regular rhythm. No murmurs, rubs, or gallops. Respiratory: Normal respiratory effort without tachypnea nor retractions. Breath sounds are clear and equal bilaterally. No wheezes/rales/rhonchi. Gastrointestinal: Soft and nontender. Normal bowel sounds Musculoskeletal: Nontender with normal range of motion in extremities. No lower extremity tenderness nor edema. Neurologic:  Normal speech and language. No gross focal neurologic deficits are appreciated.  Skin:  Skin is warm, dry and intact. No rash noted. Psychiatric: Anxious mood and  affect ____________________________________________  ED COURSE:  As part of my medical decision making, I reviewed the following data within the [place] History obtained from family if available, nursing notes, old chart and ekg, as well as notes from prior ED visits. Patient presented for severe anxiety and insomnia, we will assess with labs as indicated at this time. Clinical Course as of Nov 10 1244  Wed Nov 11, 2018  1203 Patient has been evaluated by psychiatry and upon discussion with a psychiatrist there will be some medication changes   [JW]    Clinical Course User Index [JW] Earleen Newport, MD   Procedures  [place] Shi was evaluated in Emergency Department on 11/11/2018 for the symptoms described in the history of present illness. She was evaluated in the context of the global COVID-19 pandemic, which necessitated consideration that the patient might be at risk for infection with the SARS-CoV-2 virus  that causes COVID-19. Institutional protocols and algorithms that pertain to the evaluation of patients at risk for COVID-19 are in a state of rapid change based on information released by regulatory bodies including the CDC and federal and state organizations. These policies and algorithms were followed during the patient's care in the ED.  ____________________________________________   LABS (pertinent positives/negatives)  Labs Reviewed  CBC WITH DIFFERENTIAL/PLATELET - Abnormal; Notable for the following components:      Result Value   Hemoglobin 15.3 (*)    All other components within normal limits  COMPREHENSIVE METABOLIC PANEL - Abnormal; Notable for the following components:   Glucose, Bld 123 (*)    Creatinine, Ser 1.05 (*)    GFR calc non Af Amer 52 (*)    GFR calc Af Amer 60 (*)    All other components within normal limits  URINALYSIS, COMPLETE (UACMP) WITH MICROSCOPIC - Abnormal; Notable for the following components:   Color, Urine STRAW  (*)    APPearance CLEAR (*)    Specific Gravity, Urine 1.004 (*)    All other components within normal limits  T4, FREE - Abnormal; Notable for the following components:   Free T4 1.17 (*)    All other components within normal limits  TSH  ____________________________________________   DIFFERENTIAL DIAGNOSIS   Anxiety, depression, insomnia, medication side effect, withdrawal  FINAL ASSESSMENT AND PLAN  Anxiety, insomnia   Plan: The patient had presented for anxiety and worsening insomnia. Patient's labs not reveal any acute process.  She was given IV Ativan here acutely.  We will consult TTS and psychiatry.  Initially I thought she may require geriatric psychiatric hospitalization but after consultation with psychiatry we have made some adjustments in her medication and she is comfortable with going home.   Laurence Aly, MD    Note: This note was generated in part or whole with voice recognition software. Voice recognition is usually quite accurate but there are transcription errors that can and very often do occur. I apologize for any typographical errors that were not detected and corrected.     Earleen Newport, MD 11/11/18 1247

## 2018-11-11 NOTE — ED Notes (Signed)
Pt alert and oriented. NAD noted

## 2018-11-11 NOTE — ED Triage Notes (Signed)
Pt to triage via w/c, mask in place; appears anxious; st rx valium 5mg  TID for anxiety (last ds at 7pm) but unable to sleep since Monday; st had been on xanax for several months but it was d/c'd and valium was rx by psychiatrist Dr Gilberto Better at [facility]

## 2018-11-26 DIAGNOSIS — J309 Allergic rhinitis, unspecified: Secondary | ICD-10-CM | POA: Diagnosis not present

## 2018-11-26 DIAGNOSIS — Z23 Encounter for immunization: Secondary | ICD-10-CM | POA: Diagnosis not present

## 2018-11-26 DIAGNOSIS — I10 Essential (primary) hypertension: Secondary | ICD-10-CM | POA: Diagnosis not present

## 2019-08-20 ENCOUNTER — Other Ambulatory Visit: Payer: Self-pay | Admitting: Family Medicine

## 2019-08-20 DIAGNOSIS — Z1231 Encounter for screening mammogram for malignant neoplasm of breast: Secondary | ICD-10-CM

## 2019-09-28 ENCOUNTER — Other Ambulatory Visit: Payer: Self-pay | Admitting: Family Medicine

## 2019-09-28 DIAGNOSIS — M5134 Other intervertebral disc degeneration, thoracic region: Secondary | ICD-10-CM

## 2019-09-28 DIAGNOSIS — M5416 Radiculopathy, lumbar region: Secondary | ICD-10-CM

## 2019-10-24 ENCOUNTER — Ambulatory Visit
Admission: RE | Admit: 2019-10-24 | Discharge: 2019-10-24 | Disposition: A | Discharge status: Home / Self Care | Payer: Medicare Other | Source: Ambulatory Visit | Attending: Family Medicine | Admitting: Family Medicine

## 2019-10-24 DIAGNOSIS — M5416 Radiculopathy, lumbar region: Secondary | ICD-10-CM

## 2019-10-24 DIAGNOSIS — M5134 Other intervertebral disc degeneration, thoracic region: Secondary | ICD-10-CM

## 2019-10-28 ENCOUNTER — Other Ambulatory Visit: Payer: Self-pay | Admitting: Physical Medicine and Rehabilitation

## 2019-10-28 DIAGNOSIS — M5416 Radiculopathy, lumbar region: Secondary | ICD-10-CM

## 2019-11-20 ENCOUNTER — Other Ambulatory Visit: Payer: Medicare Other

## 2019-12-03 DIAGNOSIS — R001 Bradycardia, unspecified: Secondary | ICD-10-CM | POA: Insufficient documentation

## 2019-12-12 ENCOUNTER — Ambulatory Visit
Admission: RE | Admit: 2019-12-12 | Discharge: 2019-12-12 | Disposition: A | Discharge status: Home / Self Care | Payer: Medicare Other | Source: Ambulatory Visit | Attending: Physical Medicine and Rehabilitation | Admitting: Physical Medicine and Rehabilitation

## 2019-12-12 ENCOUNTER — Other Ambulatory Visit: Payer: Self-pay

## 2019-12-12 DIAGNOSIS — M5416 Radiculopathy, lumbar region: Secondary | ICD-10-CM

## 2019-12-18 ENCOUNTER — Other Ambulatory Visit: Payer: Medicare Other

## 2020-01-06 ENCOUNTER — Other Ambulatory Visit: Payer: Self-pay

## 2020-01-06 ENCOUNTER — Ambulatory Visit
Admission: RE | Admit: 2020-01-06 | Discharge: 2020-01-06 | Disposition: A | Discharge status: Home / Self Care | Payer: Medicare Other | Source: Ambulatory Visit | Attending: Family Medicine | Admitting: Family Medicine

## 2020-01-06 DIAGNOSIS — Z1231 Encounter for screening mammogram for malignant neoplasm of breast: Secondary | ICD-10-CM | POA: Insufficient documentation

## 2020-05-08 ENCOUNTER — Other Ambulatory Visit: Payer: Self-pay | Admitting: Gastroenterology

## 2020-05-08 DIAGNOSIS — K219 Gastro-esophageal reflux disease without esophagitis: Secondary | ICD-10-CM

## 2020-05-08 DIAGNOSIS — R1084 Generalized abdominal pain: Secondary | ICD-10-CM

## 2020-05-08 DIAGNOSIS — R194 Change in bowel habit: Secondary | ICD-10-CM

## 2020-05-24 ENCOUNTER — Ambulatory Visit
Admission: RE | Admit: 2020-05-24 | Discharge: 2020-05-24 | Disposition: A | Discharge status: Home / Self Care | Payer: Medicare HMO | Source: Ambulatory Visit | Attending: Gastroenterology | Admitting: Gastroenterology

## 2020-05-24 ENCOUNTER — Other Ambulatory Visit: Payer: Self-pay

## 2020-05-24 DIAGNOSIS — K219 Gastro-esophageal reflux disease without esophagitis: Secondary | ICD-10-CM | POA: Diagnosis present

## 2020-05-24 DIAGNOSIS — R1084 Generalized abdominal pain: Secondary | ICD-10-CM | POA: Insufficient documentation

## 2020-05-24 DIAGNOSIS — R194 Change in bowel habit: Secondary | ICD-10-CM | POA: Insufficient documentation

## 2020-05-24 MED ORDER — IOHEXOL 300 MG/ML  SOLN
75.0000 mL | Freq: Once | INTRAMUSCULAR | Status: AC | PRN
  Administered 2020-05-24: 75 mL via INTRAVENOUS

## 2020-10-13 IMAGING — CT CT PELVIS W/O CM
2 of 6 series · 15 of 46 positions shown, 17 images · non-contrast
Comparison: None.

CLINICAL DATA: Severe low back pain radiating to the left lower
extremity for 2 weeks. History of lumbar spine surgery. Spinal cord
stimulator.

EXAM:
CT LUMBAR SPINE WITHOUT CONTRAST
CT PELVIS WITHOUT CONTRAST
TECHNIQUE: Multidetector CT imaging of the lumbar spine and pelvis was
performed without intravenous contrast administration. Multiplanar
CT image reconstructions were also generated.

[Series 4: axial st pelvis/hip (person_name) · axial · 0.76mm/px · z∈[-1640,-1408]mm · 12 of 134 slices shown, 14 images]
[im 9/134  soft-tissue]
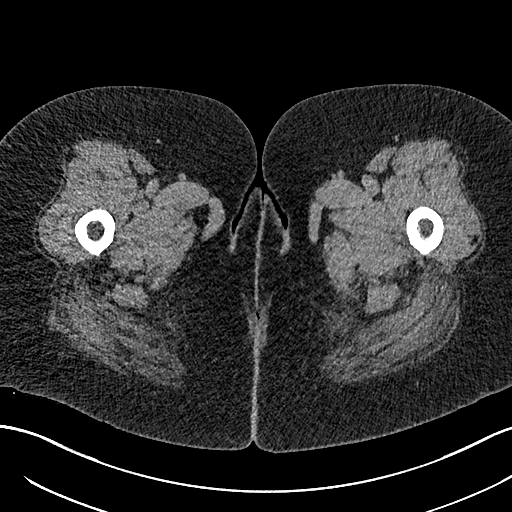
[im 9/134  bone]
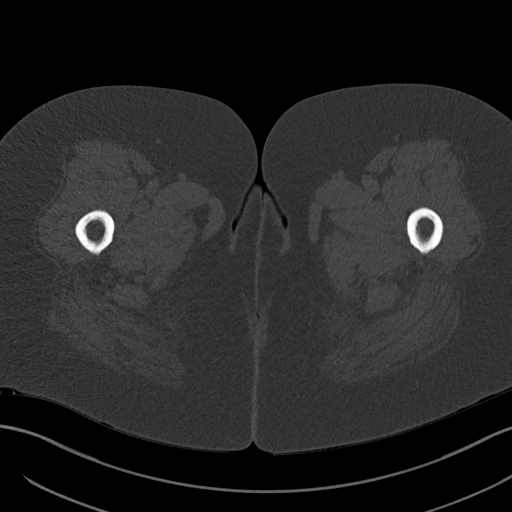
[im 18/134  soft-tissue]
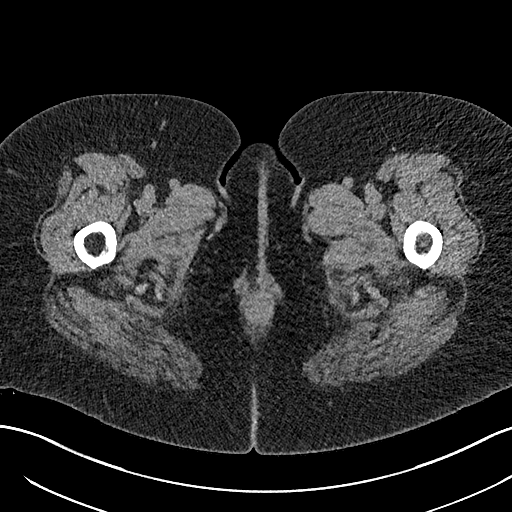
[im 27/134  soft-tissue]
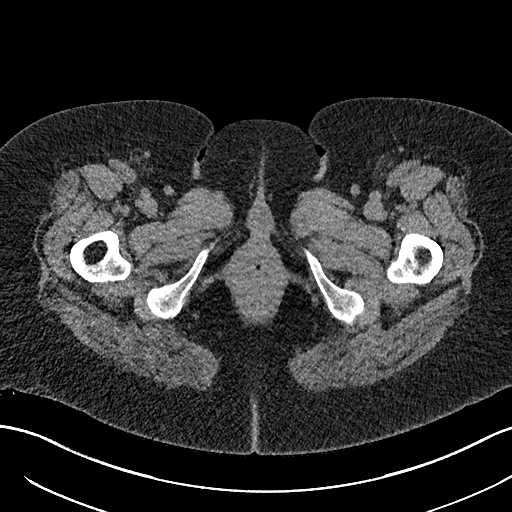
[im 45/134  soft-tissue]
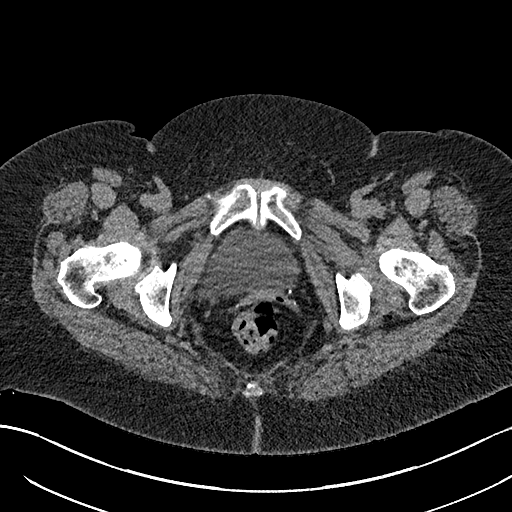
[im 54/134  soft-tissue]
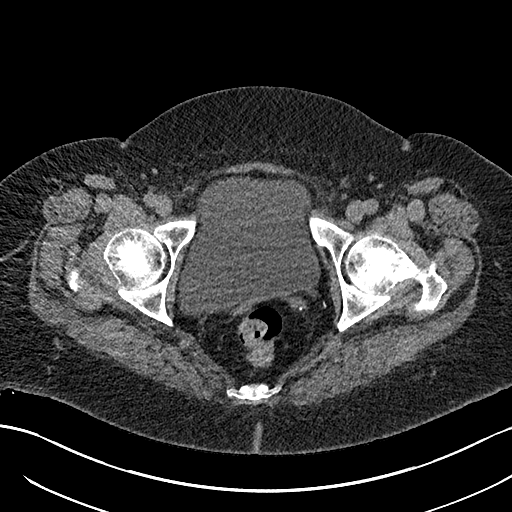
[im 63/134  soft-tissue]
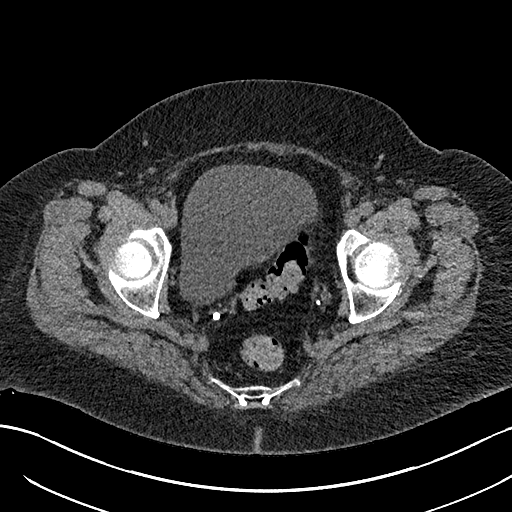
[im 71/134  soft-tissue]
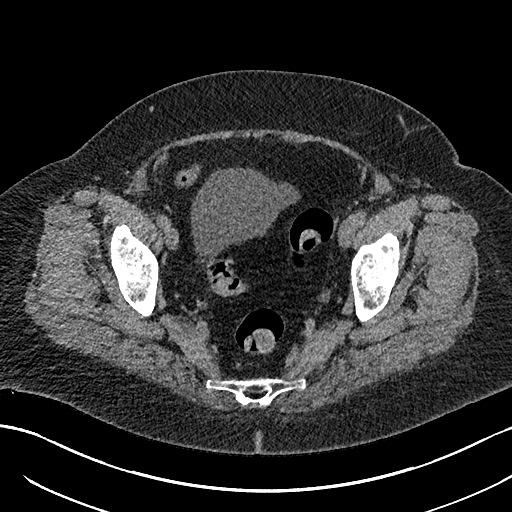
[im 80/134  soft-tissue]
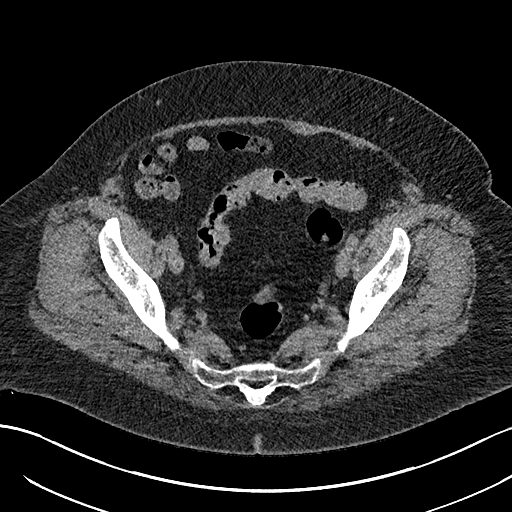
[im 89/134  soft-tissue]
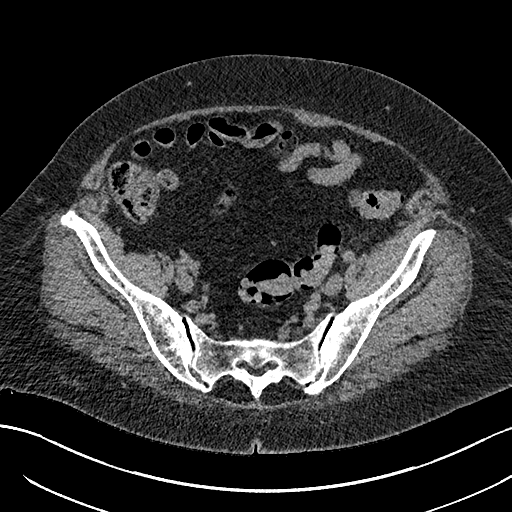
[im 89/134  bone]
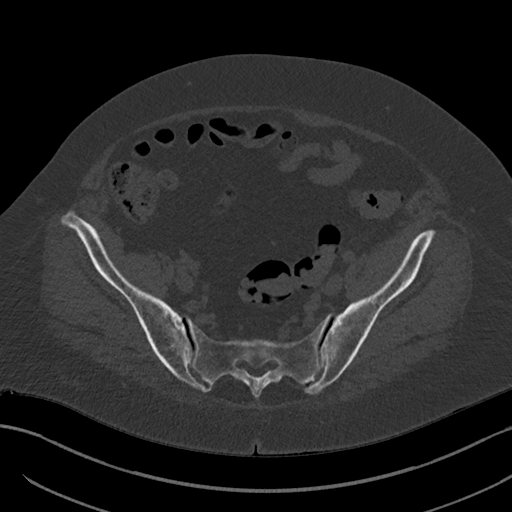
[im 107/134  soft-tissue]
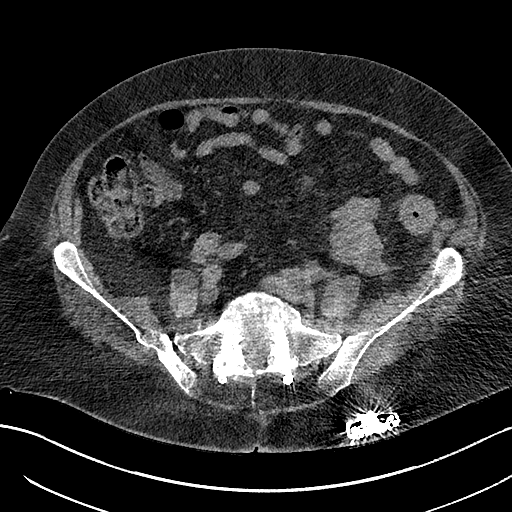
[im 116/134  soft-tissue]
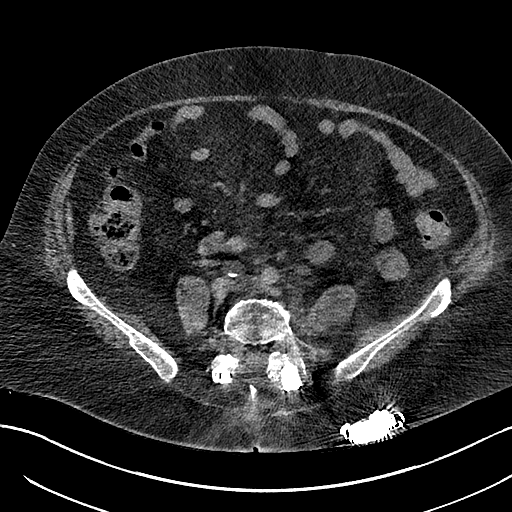
[im 125/134  soft-tissue]
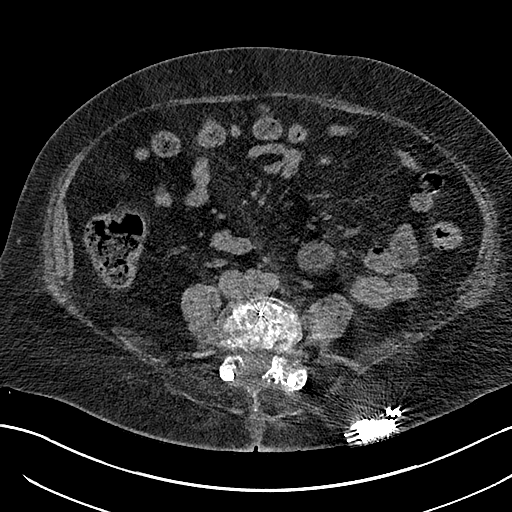

[Series 8: cor st pelvis/hip (person_name) · coronal · 0.53mm/px · 3 of 193 slices shown]
[im 49/193  soft-tissue]
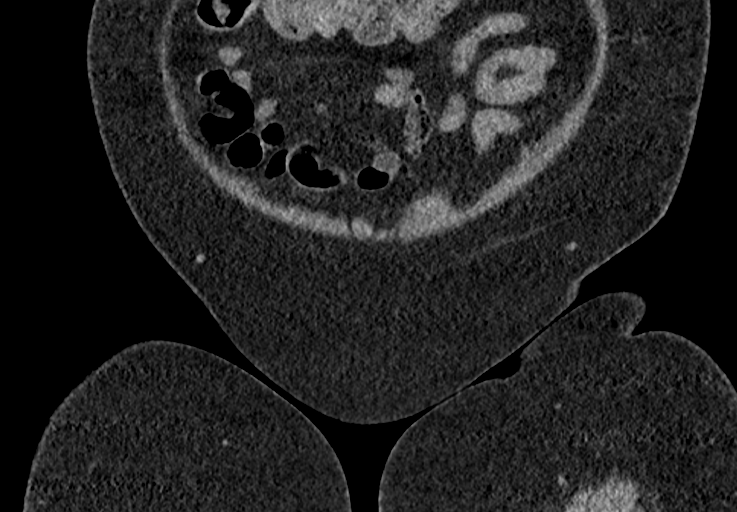
[im 97/193  soft-tissue]
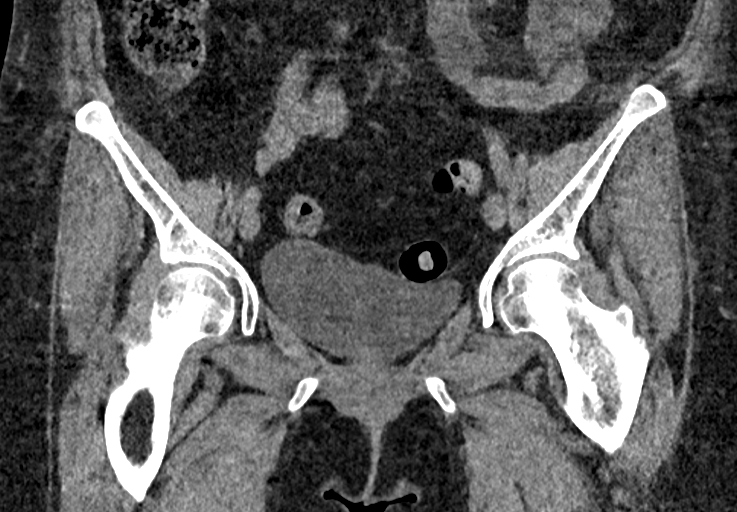
[im 145/193  soft-tissue]
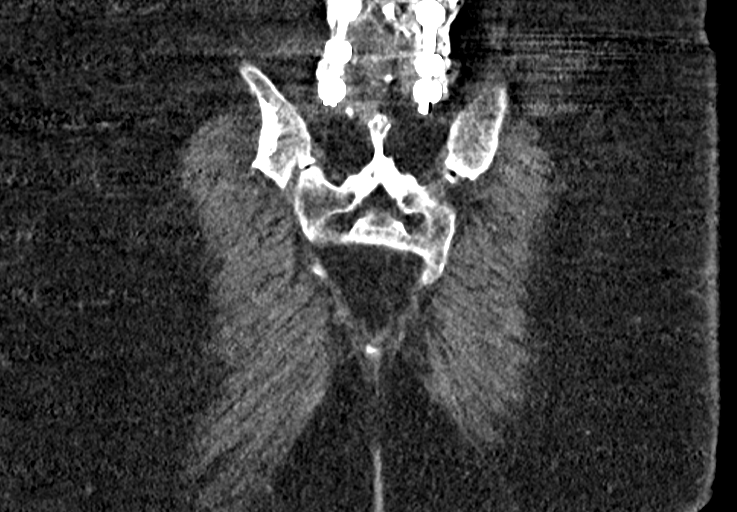

[15 of 46 positions shown; findings below may reference images not displayed]

FINDINGS: CT LUMBAR SPINE FINDINGS

Segmentation: 5 lumbar type vertebrae.

Alignment: Normal.

Vertebrae: Status post L4-S1 PLIF with posterior decompression at L4
and L5. There is a spinal cord stimulator with leads that enters the
epidural space at the T12-L1 level. There is no fracture.

Paraspinal and other soft tissues: Calcific aortic atherosclerosis.

Disc levels:

T11-12: Unremarkable

T12-L1: Disc vacuum phenomenon and small bulge. No spinal canal or
neural foraminal stenosis.

L1-2: Disc vacuum phenomenon and small disc bulge. No spinal canal
stenosis. No neural impingement.

L2-3: Small disc bulge without neural impingement or spinal canal
stenosis.

L3-4: Mild disc bulge without neural impingement or spinal canal
stenosis.

L4-5: Postfusion changes with posterior decompression and widely
patent spinal canal. No neural impingement.

L5-S1: Posterior decompression with widely patent spinal canal.
Solid arthrodesis. No neural impingement.

CT PELVIS FINDINGS

Genitourinary: Normal appearance of the urinary bladder. Status post
hysterectomy. No adnexal mass.

Gastrointestinal: The visualized small bowel is normal. Visualized
colon is normal.

Vascular/lymphatic: Mild calcific atherosclerosis. No
lymphadenopathy.

Musculoskeletal: No pelvic fracture or diastasis.

Other: Spinal cord stimulator generator in the posterior left flank
subcutaneous fat.
IMPRESSION: CT LUMBAR SPINE IMPRESSION

1. L4-S1 PLIF and posterior decompression without spinal canal or
neural foraminal stenosis.
2. No acute abnormality.

CT PELVIS FINDINGS

1. No acute abnormality.  No pelvic fracture.
2. Mild aortic atherosclerosis ([id number]-5CR.R).

These results will be called to the ordering clinician or
representative by the [HOSPITAL] at the imaging location.

## 2020-10-13 IMAGING — CT CT L SPINE W/O CM
3 of 5 series · 9 of 33 positions shown, 10 images · non-contrast
Comparison: None.

CLINICAL DATA: Severe low back pain radiating to the left lower
extremity for 2 weeks. History of lumbar spine surgery. Spinal cord
stimulator.

EXAM:
CT LUMBAR SPINE WITHOUT CONTRAST
CT PELVIS WITHOUT CONTRAST
TECHNIQUE: Multidetector CT imaging of the lumbar spine and pelvis was
performed without intravenous contrast administration. Multiplanar
CT image reconstructions were also generated.

[Series 2: (person_name) · axial · 0.30mm/px · z∈[-1388,-1388]mm · 1 of 166 slices shown, 2 images]
[im 83/166  soft-tissue]
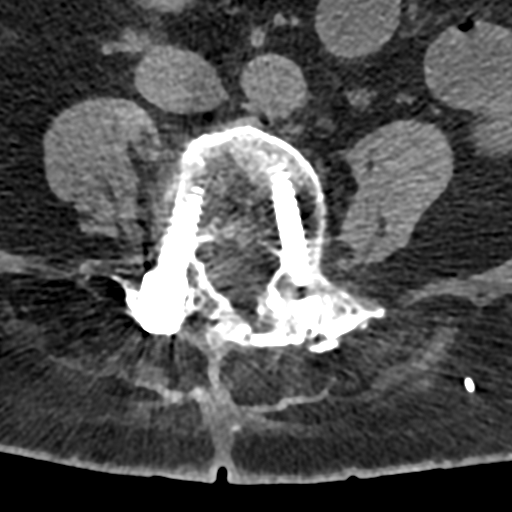
[im 83/166  bone]
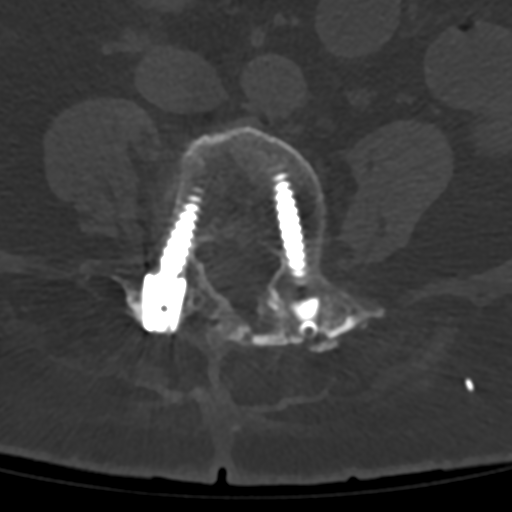

[Series 4: sag bone l-spine · sagittal · 0.30mm/px · 5 of 75 slices shown]
[im 25/75  bone]
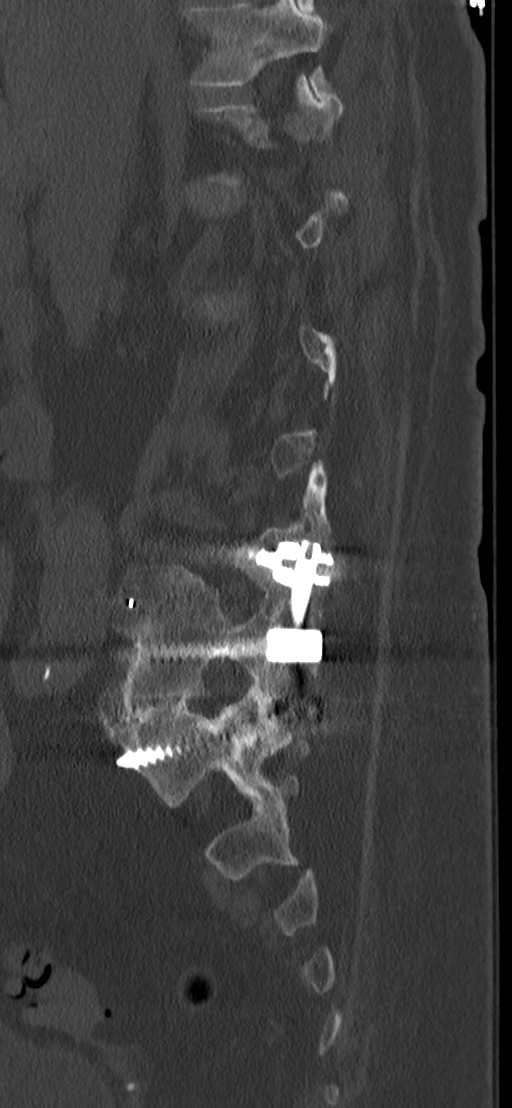
[im 31/75  bone]
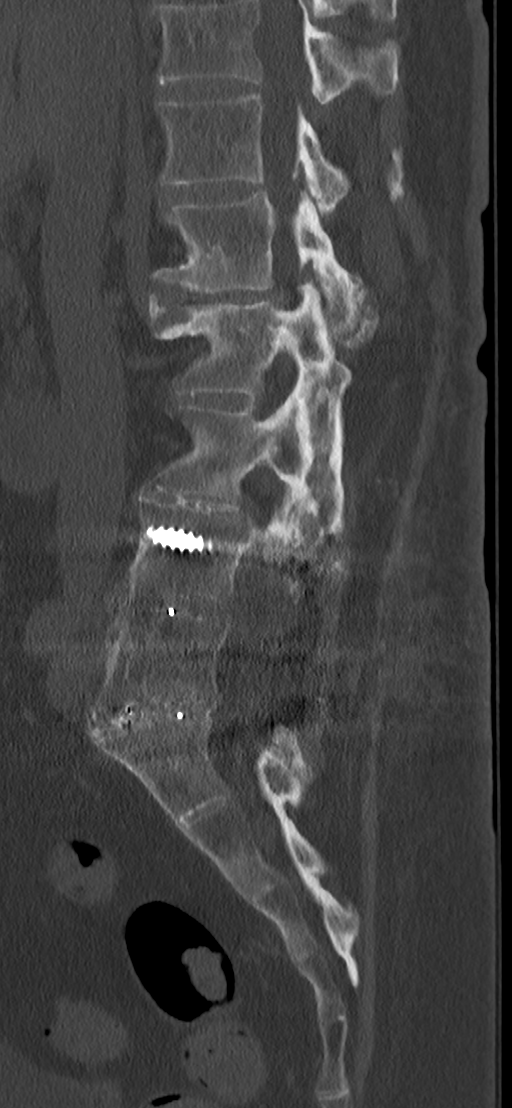
[im 38/75  bone]
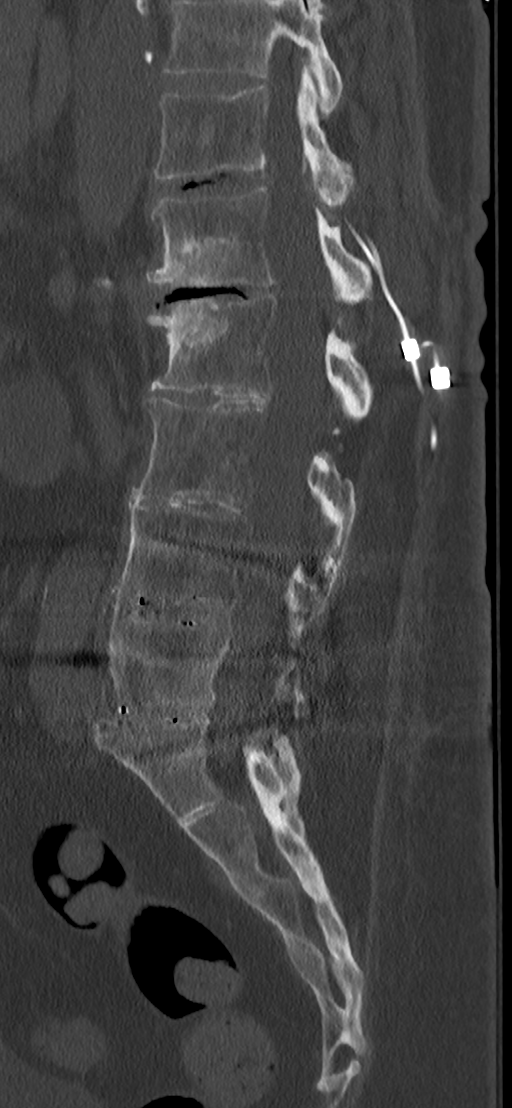
[im 44/75  bone]
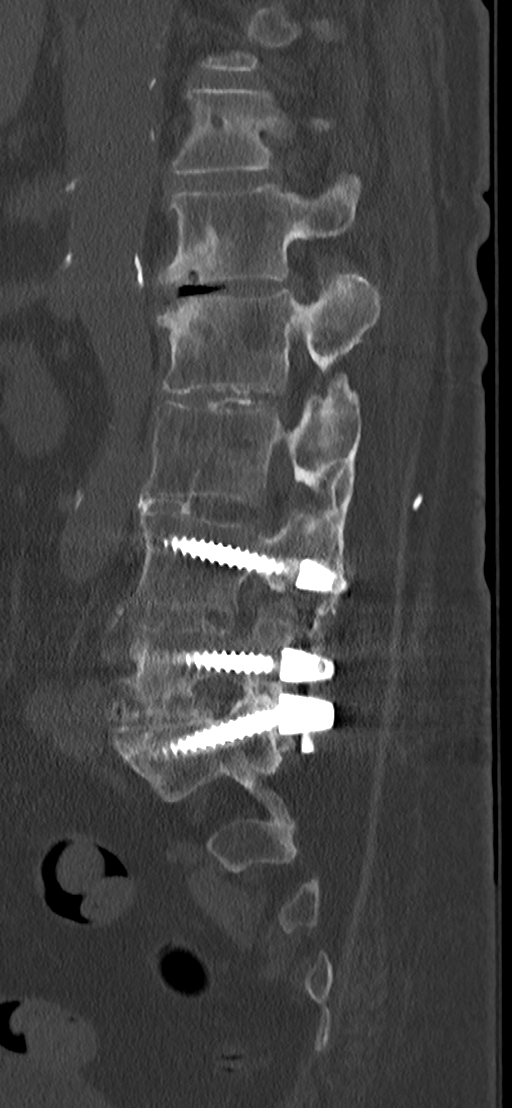
[im 50/75  bone]
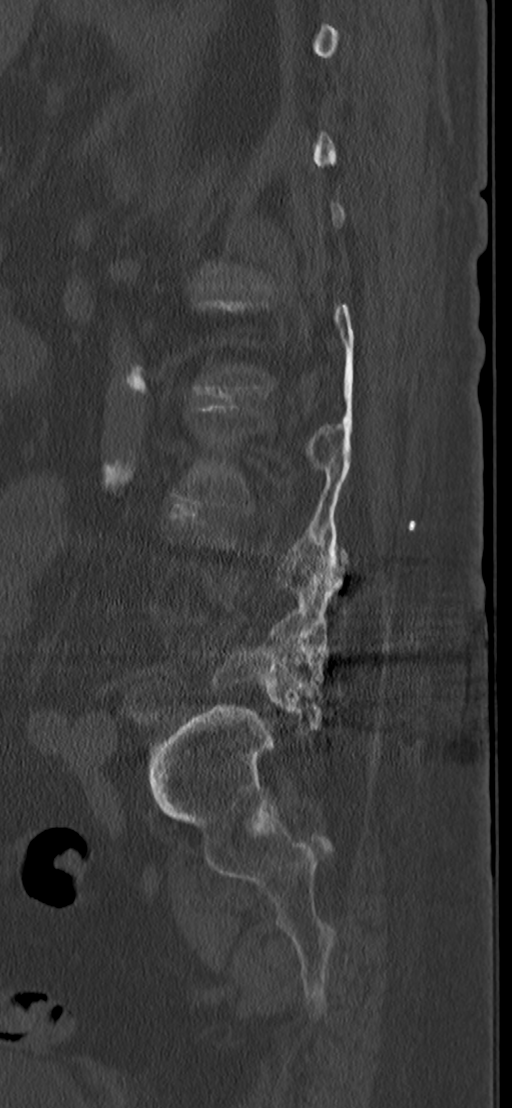

[Series 6: cor bone l-spine · coronal · 0.32mm/px · 3 of 75 slices shown]
[im 15/75  bone]
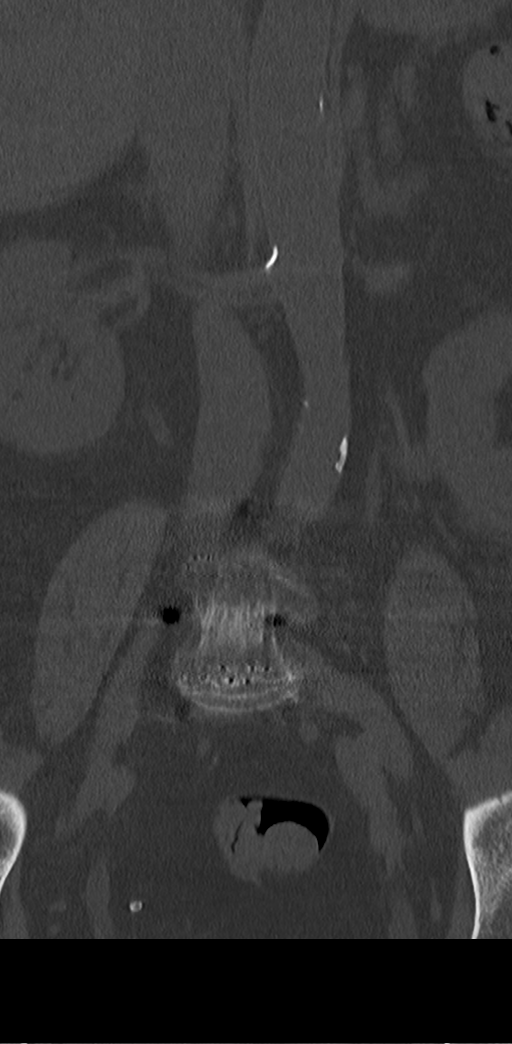
[im 30/75  bone]
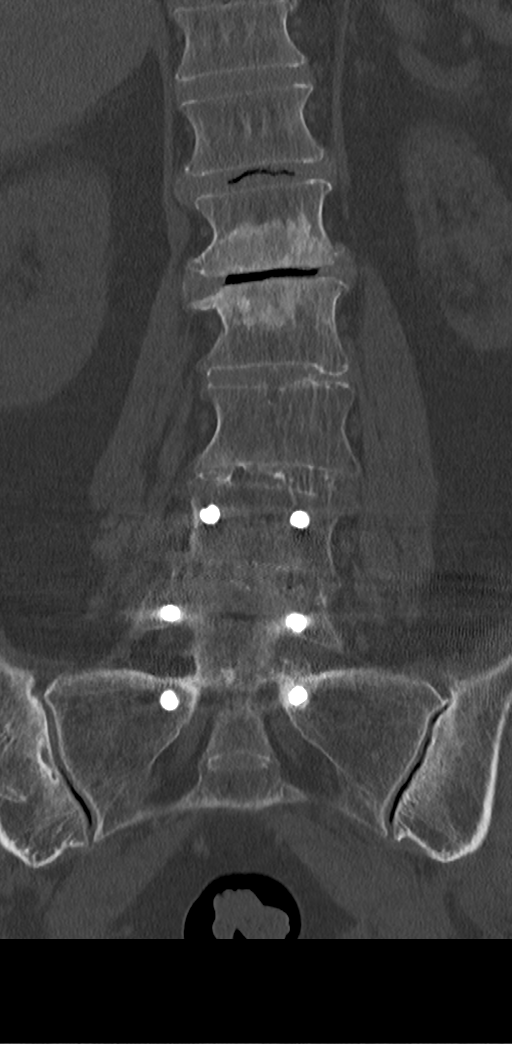
[im 45/75  bone]
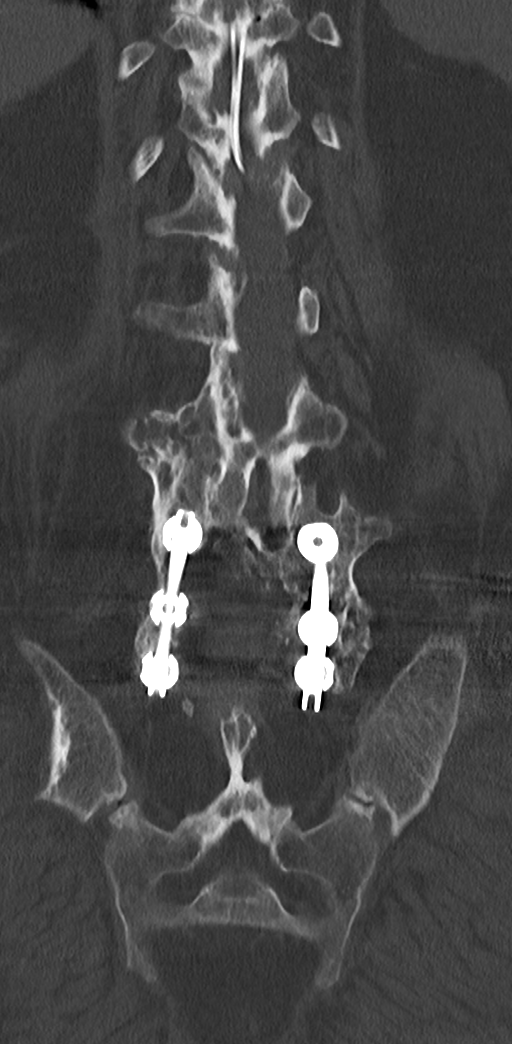

[9 of 33 positions shown; findings below may reference images not displayed]

FINDINGS: CT LUMBAR SPINE FINDINGS

Segmentation: 5 lumbar type vertebrae.

Alignment: Normal.

Vertebrae: Status post L4-S1 PLIF with posterior decompression at L4
and L5. There is a spinal cord stimulator with leads that enters the
epidural space at the T12-L1 level. There is no fracture.

Paraspinal and other soft tissues: Calcific aortic atherosclerosis.

Disc levels:

T11-12: Unremarkable

T12-L1: Disc vacuum phenomenon and small bulge. No spinal canal or
neural foraminal stenosis.

L1-2: Disc vacuum phenomenon and small disc bulge. No spinal canal
stenosis. No neural impingement.

L2-3: Small disc bulge without neural impingement or spinal canal
stenosis.

L3-4: Mild disc bulge without neural impingement or spinal canal
stenosis.

L4-5: Postfusion changes with posterior decompression and widely
patent spinal canal. No neural impingement.

L5-S1: Posterior decompression with widely patent spinal canal.
Solid arthrodesis. No neural impingement.

CT PELVIS FINDINGS

Genitourinary: Normal appearance of the urinary bladder. Status post
hysterectomy. No adnexal mass.

Gastrointestinal: The visualized small bowel is normal. Visualized
colon is normal.

Vascular/lymphatic: Mild calcific atherosclerosis. No
lymphadenopathy.

Musculoskeletal: No pelvic fracture or diastasis.

Other: Spinal cord stimulator generator in the posterior left flank
subcutaneous fat.
IMPRESSION: CT LUMBAR SPINE IMPRESSION

1. L4-S1 PLIF and posterior decompression without spinal canal or
neural foraminal stenosis.
2. No acute abnormality.

CT PELVIS FINDINGS

1. No acute abnormality.  No pelvic fracture.
2. Mild aortic atherosclerosis ([id number].R).

These results will be called to the ordering clinician or
representative by the [HOSPITAL] at the imaging location.

## 2020-10-18 ENCOUNTER — Emergency Department: Payer: Medicare HMO

## 2020-10-18 ENCOUNTER — Other Ambulatory Visit: Payer: Self-pay

## 2020-10-18 ENCOUNTER — Encounter: Payer: Self-pay | Admitting: Emergency Medicine

## 2020-10-18 ENCOUNTER — Emergency Department
Admission: EM | Admit: 2020-10-18 | Discharge: 2020-10-18 | Disposition: A | Discharge status: Home / Self Care | Payer: Medicare HMO | Attending: Emergency Medicine | Admitting: Emergency Medicine

## 2020-10-18 DIAGNOSIS — Z79899 Other long term (current) drug therapy: Secondary | ICD-10-CM | POA: Diagnosis not present

## 2020-10-18 DIAGNOSIS — R1084 Generalized abdominal pain: Secondary | ICD-10-CM

## 2020-10-18 DIAGNOSIS — R1032 Left lower quadrant pain: Secondary | ICD-10-CM | POA: Diagnosis not present

## 2020-10-18 DIAGNOSIS — Z87891 Personal history of nicotine dependence: Secondary | ICD-10-CM | POA: Insufficient documentation

## 2020-10-18 DIAGNOSIS — I10 Essential (primary) hypertension: Secondary | ICD-10-CM | POA: Diagnosis not present

## 2020-10-18 DIAGNOSIS — J45909 Unspecified asthma, uncomplicated: Secondary | ICD-10-CM | POA: Diagnosis not present

## 2020-10-18 DIAGNOSIS — E039 Hypothyroidism, unspecified: Secondary | ICD-10-CM | POA: Diagnosis not present

## 2020-10-18 DIAGNOSIS — Z20822 Contact with and (suspected) exposure to covid-19: Secondary | ICD-10-CM | POA: Insufficient documentation

## 2020-10-18 DIAGNOSIS — R1013 Epigastric pain: Secondary | ICD-10-CM | POA: Insufficient documentation

## 2020-10-18 DIAGNOSIS — R531 Weakness: Secondary | ICD-10-CM | POA: Diagnosis not present

## 2020-10-18 DIAGNOSIS — R079 Chest pain, unspecified: Secondary | ICD-10-CM | POA: Diagnosis not present

## 2020-10-18 DIAGNOSIS — R197 Diarrhea, unspecified: Secondary | ICD-10-CM | POA: Diagnosis not present

## 2020-10-18 LAB — COMPREHENSIVE METABOLIC PANEL
ALT: 20 U/L (ref 0–44)
AST: 19 U/L (ref 15–41)
Albumin: 4.1 g/dL (ref 3.5–5.0)
Alkaline Phosphatase: 90 U/L (ref 38–126)
Anion gap: 9 (ref 5–15)
BUN: 18 mg/dL (ref 8–23)
CO2: 24 mmol/L (ref 22–32)
Calcium: 9.7 mg/dL (ref 8.9–10.3)
Chloride: 105 mmol/L (ref 98–111)
Creatinine, Ser: 1.16 mg/dL — ABNORMAL HIGH (ref 0.44–1.00)
GFR, Estimated: 48 mL/min — ABNORMAL LOW (ref >60)
Glucose, Bld: 102 mg/dL — ABNORMAL HIGH (ref 70–99)
Potassium: 4.2 mmol/L (ref 3.5–5.1)
Sodium: 138 mmol/L (ref 135–145)
Total Bilirubin: 0.8 mg/dL (ref 0.3–1.2)
Total Protein: 7.1 g/dL (ref 6.5–8.1)

## 2020-10-18 LAB — URINALYSIS, COMPLETE (UACMP) WITH MICROSCOPIC
Bacteria, UA: NONE SEEN
Bilirubin Urine: NEGATIVE
Glucose, UA: NEGATIVE mg/dL
Hgb urine dipstick: NEGATIVE
Ketones, ur: NEGATIVE mg/dL
Nitrite: NEGATIVE
Protein, ur: NEGATIVE mg/dL
Specific Gravity, Urine: 1.008 (ref 1.005–1.030)
pH: 7 (ref 5.0–8.0)

## 2020-10-18 LAB — CBC WITH DIFFERENTIAL/PLATELET
Abs Immature Granulocytes: 0.02 10*3/uL (ref 0.00–0.07)
Basophils Absolute: 0 10*3/uL (ref 0.0–0.1)
Basophils Relative: 0 %
Eosinophils Absolute: 0.3 10*3/uL (ref 0.0–0.5)
Eosinophils Relative: 5 %
HCT: 41.2 % (ref 36.0–46.0)
Hemoglobin: 14.6 g/dL (ref 12.0–15.0)
Immature Granulocytes: 0 %
Lymphocytes Relative: 46 %
Lymphs Abs: 2.8 10*3/uL (ref 0.7–4.0)
MCH: 33.2 pg (ref 26.0–34.0)
MCHC: 35.4 g/dL (ref 30.0–36.0)
MCV: 93.6 fL (ref 80.0–100.0)
Monocytes Absolute: 0.6 10*3/uL (ref 0.1–1.0)
Monocytes Relative: 10 %
Neutro Abs: 2.4 10*3/uL (ref 1.7–7.7)
Neutrophils Relative %: 39 %
Platelets: 220 10*3/uL (ref 150–400)
RBC: 4.4 MIL/uL (ref 3.87–5.11)
RDW: 13.4 % (ref 11.5–15.5)
WBC: 6.2 10*3/uL (ref 4.0–10.5)
nRBC: 0 % (ref 0.0–0.2)

## 2020-10-18 LAB — RESP PANEL BY RT-PCR (FLU A&B, COVID) ARPGX2
Influenza A by PCR: NEGATIVE
Influenza B by PCR: NEGATIVE
SARS Coronavirus 2 by RT PCR: NEGATIVE

## 2020-10-18 LAB — TROPONIN I (HIGH SENSITIVITY)
Troponin I (High Sensitivity): 8 ng/L (ref <18)
Troponin I (High Sensitivity): 9 ng/L (ref <18)

## 2020-10-18 LAB — LIPASE, BLOOD: Lipase: 27 U/L (ref 11–51)

## 2020-10-18 LAB — TSH: TSH: 3.872 u[IU]/mL (ref 0.350–4.500)

## 2020-10-18 MED ORDER — IOHEXOL 350 MG/ML SOLN
80.0000 mL | Freq: Once | INTRAVENOUS | Status: AC | PRN
  Administered 2020-10-18: 80 mL via INTRAVENOUS

## 2020-10-18 NOTE — ED Provider Notes (Signed)
[facility] Emergency Department Provider Note   ____________________________________________   Event Date/Time   First MD Initiated Contact with Patient 10/18/20 [phone]     (approximate)  I have reviewed the triage vital signs and the nursing notes.   HISTORY  Chief Complaint Chest Pain and Abdominal Pain    HPI Madison Jennings is a 80 y.o. female with past medical history of hypertension, hyperlipidemia, CKD, bradycardia, hypothyroidism, and GERD who presents to the ED complaining of abdominal pain.  Patient reports that she has had a couple days of pain migrating to different locations in her abdomen.  She states that it initially seemed to be in her epigastrium but has moved towards the left lower quadrant of her abdomen starting today.  Pain is described as constant and sharp, not exacerbated or alleviated by anything in particular.  It has been accompanied with intermittent pain in her chest that is also described as sharp.  She does not have any pain in her chest currently, does state that she gets out of breath with exertion and has been feeling fatigued, although this has been going on for multiple months and is currently being followed by cardiology.  She denies any fevers, cough, has been feeling nauseous, but denies any vomiting.  She does state that her stools have been dark and discolored lately, occasionally green.  She has not had any dysuria or flank pain.        Past Medical History:  Diagnosis Date   Arthritis    Asthma    Chronic back pain    Chronic back pain    scoliosis and 2 buldging disc   Depression    Diastolic dysfunction    Diverticulosis    Dysrhythmia    PALPITATIONS   Fibromyalgia    Gastritis    GERD (gastroesophageal reflux disease)    takes Protonix daily   Headache(784.0)    sinus related   Hyperlipidemia    just diagnosed;will follow up in 6wks to discuss meds   Hypertension    takes Lisinopril daily    Hypothyroidism    takes Synthroid daily   Ischemic colitis ([place])    Joint pain    Nocturia    Obesity    Vitamin D deficiency    just placed on Vit D this week    Patient Active Problem List   Diagnosis Date Noted   HNP (herniated nucleus pulposus), thoracic 04/24/2018   Pain in joint, shoulder region 11/22/2013   Shoulder weakness 11/22/2013   Diarrhea 05/02/2011   Generalized weakness 05/02/2011   Dehydration 05/02/2011   HTN (hypertension) 05/02/2011   Hypothyroidism 05/02/2011   GERD (gastroesophageal reflux disease) 05/02/2011    Past Surgical History:  Procedure Laterality Date   ABDOMINAL HYSTERECTOMY     APPENDECTOMY     BACK SURGERY     x 3   CARDIAC CATHETERIZATION  04/13/2010   normal cororanies, EF 55% ([facility]; Dr. Clayborn Bigness)   CHOLECYSTECTOMY     COLONOSCOPY     COLONOSCOPY WITH PROPOFOL N/A 01/21/2017   Procedure: COLONOSCOPY WITH PROPOFOL;  Surgeon: Lollie Sails, MD;  Location: [facility] ENDOSCOPY;  Service: Endoscopy;  Laterality: N/A;   CORONARY ANGIOPLASTY     ESOPHAGOGASTRODUODENOSCOPY  06/12/2011   ESOPHAGOGASTRODUODENOSCOPY (EGD) WITH PROPOFOL N/A 01/21/2017   Procedure: ESOPHAGOGASTRODUODENOSCOPY (EGD) WITH PROPOFOL;  Surgeon: Lollie Sails, MD;  Location: [facility] ENDOSCOPY;  Service: Endoscopy;  Laterality: N/A;   FOOT SURGERY Left    d/t neuroma  OOPHORECTOMY     radiation to thyroid     RIGHT/LEFT HEART CATH AND CORONARY ANGIOGRAPHY Bilateral 10/22/2016   Procedure: RIGHT/LEFT HEART CATH AND CORONARY ANGIOGRAPHY;  Surgeon: Yolonda Kida, MD;  Location: [place] [facility];  Service: Cardiovascular;  Laterality: Bilateral;   SPINAL CORD STIMULATOR INSERTION N/A 07/16/2013   Procedure: LUMBAR SPINAL CORD STIMULATOR INSERTION;  Surgeon: Bonna Gains, MD;  Location: [place];  Service: Neurosurgery;  Laterality: N/A;   THORACIC DISCECTOMY Left 04/24/2018   Procedure: Left Thoracic ten- thoracic eleven Microdiscectomy, Removal of Spinal Cord  Stimulator;  Surgeon: Kristeen Miss, MD;  Location: [place];  Service: Neurosurgery;  Laterality: Left;  Left Thoracic ten- thoracic eleven Microdiscectomy, Removal of Spinal Cord Stimulator    Prior to Admission medications   Medication Sig Start Date End Date Taking? Authorizing Provider  citalopram (CELEXA) 10 MG tablet Take 1 tablet (10 mg total) by mouth daily. 11/11/18 11/11/19  Suella Broad, FNP  cloNIDine (CATAPRES) 0.1 MG tablet Take 0.1 mg by mouth at bedtime.    [provider]  diazepam (VALIUM) 5 MG tablet Take 5 mg by mouth 3 (three) times daily.    [provider]  levothyroxine (SYNTHROID, LEVOTHROID) 75 MCG tablet Take 75 mcg by mouth daily before breakfast.    [provider]  lisinopril (PRINIVIL,ZESTRIL) 40 MG tablet Take 40 mg by mouth daily.    [provider]  mirtazapine (REMERON) 15 MG tablet Take 1 tablet (15 mg total) by mouth at bedtime. 11/11/18   Starkes-Perry, Gayland Curry, FNP  pantoprazole (PROTONIX) 40 MG tablet Take 40 mg by mouth daily.    [provider]    Allergies Patient has no known allergies.  Family History  Problem Relation Age of Onset   Breast cancer Neg Hx     Social History Social History   Tobacco Use   Smoking status: Former    Packs/day: 0.50    Years: 0.00    Pack years: 0.00    Types: Cigarettes    Quit date: 06/20/1986    Years since quitting: 34.3   Smokeless tobacco: Never  Vaping Use   Vaping Use: Never used  Substance Use Topics   Alcohol use: No   Drug use: No    Review of Systems  Constitutional: No fever/chills.  Positive for malaise and fatigue. Eyes: No visual changes. ENT: No sore throat. Cardiovascular: Positive for chest pain. Respiratory: Denies shortness of breath. Gastrointestinal: Positive for abdominal pain and nausea, no vomiting.  No diarrhea.  No constipation. Genitourinary: Negative for dysuria. Musculoskeletal: Negative for back pain. Skin:  Negative for rash. Neurological: Negative for headaches, focal weakness or numbness.  ____________________________________________   PHYSICAL EXAM:  VITAL SIGNS: ED Triage Vitals  Enc Vitals Group     BP 10/18/20 0841 (!) 156/70     Pulse Rate 10/18/20 0841 (!) 55     Resp 10/18/20 0841 16     Temp 10/18/20 0841 97.9 F (36.6 C)     Temp Source 10/18/20 0841 Oral     SpO2 10/18/20 0841 95 %     Weight 10/18/20 0843 175 lb 0.7 oz (79.4 kg)     Height 10/18/20 0843 '5\' 5"'$  (1.651 m)     Head Circumference --      Peak Flow --      Pain Score 10/18/20 0842 6     Pain Loc --      Pain Edu? --  Excl. in [place]? --     Constitutional: Alert and oriented. Eyes: Conjunctivae are normal. Head: Atraumatic. Nose: No congestion/rhinnorhea. Mouth/Throat: Mucous membranes are moist. Neck: Normal ROM Cardiovascular: Bradycardic, regular rhythm. Grossly normal heart sounds.  2+ radial pulses bilaterally. Respiratory: Normal respiratory effort.  No retractions. Lungs CTAB.  No chest wall tenderness to palpation. Gastrointestinal: Soft and diffusely tender to palpation with no rebound or guarding. No distention. Genitourinary: deferred Musculoskeletal: No lower extremity tenderness nor edema. Neurologic:  Normal speech and language. No gross focal neurologic deficits are appreciated. Skin:  Skin is warm, dry and intact. No rash noted. Psychiatric: Mood and affect are normal. Speech and behavior are normal.  ____________________________________________   LABS (all labs ordered are listed, but only abnormal results are displayed)  Labs Reviewed  COMPREHENSIVE METABOLIC PANEL - Abnormal; Notable for the following components:      Result Value   Glucose, Bld 102 (*)    Creatinine, Ser 1.16 (*)    GFR, Estimated 48 (*)    All other components within normal limits  URINALYSIS, COMPLETE (UACMP) WITH MICROSCOPIC - Abnormal; Notable for the following components:   Color, Urine STRAW (*)     APPearance CLEAR (*)    Leukocytes,Ua TRACE (*)    All other components within normal limits  RESP PANEL BY RT-PCR (FLU A&B, COVID) ARPGX2  CBC WITH DIFFERENTIAL/PLATELET  LIPASE, BLOOD  TSH  TROPONIN I (HIGH SENSITIVITY)  TROPONIN I (HIGH SENSITIVITY)   ____________________________________________  EKG  ED ECG REPORT I, Blake Divine, the attending physician, personally viewed and interpreted this ECG.   Date: 10/18/2020  EKG Time: 8:49  Rate: 53  Rhythm: sinus bradycardia  Axis: LAD  Intervals:first-degree A-V block   ST&T Change: None   PROCEDURES  Procedure(s) performed (including Critical Care):  Procedures   ____________________________________________   INITIAL IMPRESSION / ASSESSMENT AND PLAN / ED COURSE      80 year old female with past medical history of hypertension, hyperlipidemia, CKD, bradycardia, GERD, and hypothyroidism who presents to the ED with 2 days of constant abdominal pain starting in her epigastrium and now moving towards her lower abdomen associated with intermittent pain in her chest and fatigue.  The chest pain and fatigue appear to be a chronic issue for patient for which she has been following with cardiology.  She had a normal catheterization in 2018 and a normal stress test in 2019.  EKG today appears unchanged with no evidence of arrhythmia or ischemia, we will screen troponin but I have low suspicion for ACS or PE, especially as her chest pain has resolved.  She does have diffuse tenderness in her abdomen and we will further assess with labs, UA, and CT scan.  She took ibuprofen prior to arrival and declines pain medication at this time.  Abdominal CT scan is reassuring with no obvious source of patient's acute pain.  Hemoglobin is stable and I doubt significant GI bleeding at this time.  Remainder of labs are unremarkable and UA shows no signs of infection.  2 sets of troponin are negative and I doubt ACS or PE.  Testing for COVID-19  and influenza is negative, patient's symptoms may be due to alternative viral source of gastroenteritis.  She is appropriate for discharge home and was counseled to take loperamide as needed, schedule follow-up with her PCP, and to return to the ED for new or worsening symptoms.  Patient and husband agree with plan.      ____________________________________________   FINAL CLINICAL IMPRESSION(S) /  ED DIAGNOSES  Final diagnoses:  Generalized weakness  Chest pain, unspecified type  Generalized abdominal pain  Diarrhea, unspecified type     ED Discharge Orders     None        Note:  This document was prepared using Dragon voice recognition software and may include unintentional dictation errors.    Blake Divine, MD 10/18/20 [phone]

## 2020-10-18 NOTE — ED Triage Notes (Signed)
Pt reports that she has had abd pain and chest pain for the last few days with black stools

## 2020-10-18 NOTE — ED Notes (Signed)
Pt return from CT.

## 2020-10-18 NOTE — ED Notes (Signed)
Patient transported to CT 

## 2020-11-08 ENCOUNTER — Emergency Department: Payer: Medicare HMO

## 2020-11-08 ENCOUNTER — Encounter: Payer: Self-pay | Admitting: Emergency Medicine

## 2020-11-08 ENCOUNTER — Emergency Department
Admission: EM | Admit: 2020-11-08 | Discharge: 2020-11-08 | Disposition: A | Discharge status: Home / Self Care | Payer: Medicare HMO | Attending: Emergency Medicine | Admitting: Emergency Medicine

## 2020-11-08 ENCOUNTER — Other Ambulatory Visit: Payer: Self-pay

## 2020-11-08 DIAGNOSIS — Z79899 Other long term (current) drug therapy: Secondary | ICD-10-CM | POA: Insufficient documentation

## 2020-11-08 DIAGNOSIS — H9209 Otalgia, unspecified ear: Secondary | ICD-10-CM | POA: Diagnosis not present

## 2020-11-08 DIAGNOSIS — K30 Functional dyspepsia: Secondary | ICD-10-CM

## 2020-11-08 DIAGNOSIS — I10 Essential (primary) hypertension: Secondary | ICD-10-CM | POA: Diagnosis not present

## 2020-11-08 DIAGNOSIS — E039 Hypothyroidism, unspecified: Secondary | ICD-10-CM | POA: Insufficient documentation

## 2020-11-08 DIAGNOSIS — R109 Unspecified abdominal pain: Secondary | ICD-10-CM | POA: Diagnosis not present

## 2020-11-08 DIAGNOSIS — Z87891 Personal history of nicotine dependence: Secondary | ICD-10-CM | POA: Diagnosis not present

## 2020-11-08 DIAGNOSIS — Z20822 Contact with and (suspected) exposure to covid-19: Secondary | ICD-10-CM | POA: Diagnosis not present

## 2020-11-08 DIAGNOSIS — J45909 Unspecified asthma, uncomplicated: Secondary | ICD-10-CM | POA: Insufficient documentation

## 2020-11-08 DIAGNOSIS — R519 Headache, unspecified: Secondary | ICD-10-CM | POA: Insufficient documentation

## 2020-11-08 DIAGNOSIS — J0110 Acute frontal sinusitis, unspecified: Secondary | ICD-10-CM | POA: Diagnosis not present

## 2020-11-08 LAB — RESP PANEL BY RT-PCR (FLU A&B, COVID) ARPGX2
Influenza A by PCR: NEGATIVE
Influenza B by PCR: NEGATIVE
SARS Coronavirus 2 by RT PCR: NEGATIVE

## 2020-11-08 LAB — CBC WITH DIFFERENTIAL/PLATELET
Abs Immature Granulocytes: 0.03 10*3/uL (ref 0.00–0.07)
Basophils Absolute: 0 10*3/uL (ref 0.0–0.1)
Basophils Relative: 0 %
Eosinophils Absolute: 0.1 10*3/uL (ref 0.0–0.5)
Eosinophils Relative: 2 %
HCT: 42.9 % (ref 36.0–46.0)
Hemoglobin: 15 g/dL (ref 12.0–15.0)
Immature Granulocytes: 1 %
Lymphocytes Relative: 37 %
Lymphs Abs: 2 10*3/uL (ref 0.7–4.0)
MCH: 32.3 pg (ref 26.0–34.0)
MCHC: 35 g/dL (ref 30.0–36.0)
MCV: 92.5 fL (ref 80.0–100.0)
Monocytes Absolute: 0.3 10*3/uL (ref 0.1–1.0)
Monocytes Relative: 6 %
Neutro Abs: 2.9 10*3/uL (ref 1.7–7.7)
Neutrophils Relative %: 54 %
Platelets: 234 10*3/uL (ref 150–400)
RBC: 4.64 MIL/uL (ref 3.87–5.11)
RDW: 13.2 % (ref 11.5–15.5)
WBC: 5.4 10*3/uL (ref 4.0–10.5)
nRBC: 0 % (ref 0.0–0.2)

## 2020-11-08 LAB — COMPREHENSIVE METABOLIC PANEL
ALT: 17 U/L (ref 0–44)
AST: 31 U/L (ref 15–41)
Albumin: 4.1 g/dL (ref 3.5–5.0)
Alkaline Phosphatase: 87 U/L (ref 38–126)
Anion gap: 12 (ref 5–15)
BUN: 11 mg/dL (ref 8–23)
CO2: 19 mmol/L — ABNORMAL LOW (ref 22–32)
Calcium: 9.3 mg/dL (ref 8.9–10.3)
Chloride: 106 mmol/L (ref 98–111)
Creatinine, Ser: 1.24 mg/dL — ABNORMAL HIGH (ref 0.44–1.00)
GFR, Estimated: 45 mL/min — ABNORMAL LOW (ref >60)
Glucose, Bld: 199 mg/dL — ABNORMAL HIGH (ref 70–99)
Potassium: 4.2 mmol/L (ref 3.5–5.1)
Sodium: 137 mmol/L (ref 135–145)
Total Bilirubin: 1.1 mg/dL (ref 0.3–1.2)
Total Protein: 7.2 g/dL (ref 6.5–8.1)

## 2020-11-08 LAB — LACTIC ACID, PLASMA: Lactic Acid, Venous: 2.7 mmol/L (ref 0.5–1.9)

## 2020-11-08 LAB — LIPASE, BLOOD: Lipase: 26 U/L (ref 11–51)

## 2020-11-08 MED ORDER — PHENYLEPHRINE-APAP-GUAIFENESIN 5-325-200 MG PO CAPS: 1.0000 | ORAL_CAPSULE | Freq: Three times a day (TID) | ORAL | 0 refills | Status: AC | PRN

## 2020-11-08 MED ORDER — PSEUDOEPHEDRINE HCL ER 120 MG PO TB12
120.0000 mg | ORAL_TABLET | Freq: Once | ORAL | Status: AC
  Administered 2020-11-08: 120 mg via ORAL
  Filled 2020-11-08: qty 1

## 2020-11-08 MED ORDER — AZITHROMYCIN 500 MG PO TABS
1000.0000 mg | ORAL_TABLET | Freq: Once | ORAL | Status: AC
  Administered 2020-11-08: 1000 mg via ORAL
  Filled 2020-11-08: qty 2

## 2020-11-08 MED ORDER — ONDANSETRON 4 MG PO TBDP
4.0000 mg | ORAL_TABLET | Freq: Once | ORAL | Status: AC
  Administered 2020-11-08: 4 mg via ORAL
  Filled 2020-11-08: qty 1

## 2020-11-08 MED ORDER — AZITHROMYCIN 250 MG PO TABS
ORAL_TABLET | ORAL | 0 refills | Status: AC
Start: 2020-11-08 — End: 2020-11-13

## 2020-11-08 MED ORDER — FENTANYL CITRATE PF 50 MCG/ML IJ SOSY
50.0000 ug | PREFILLED_SYRINGE | Freq: Once | INTRAMUSCULAR | Status: AC
  Administered 2020-11-08: 50 ug via INTRAVENOUS
  Filled 2020-11-08: qty 1

## 2020-11-08 MED ORDER — FAMOTIDINE 20 MG IN NS 100 ML IVPB
20.0000 mg | Freq: Once | INTRAVENOUS | Status: AC
  Administered 2020-11-08: 20 mg via INTRAVENOUS
  Filled 2020-11-08: qty 100

## 2020-11-08 NOTE — ED Provider Notes (Signed)
[facility] Emergency Department Provider Note   ____________________________________________   Event Date/Time   First MD Initiated Contact with Patient 11/08/20 [phone]     (approximate)  I have reviewed the triage vital signs and the nursing notes.   HISTORY  Chief Complaint Headache, Abdominal Pain, and Otalgia    HPI Madison Jennings is a 80 y.o. female who presents for a frontal headache for the last 2 days  LOCATION: Frontal sinus area DURATION: 2 days prior to arrival TIMING: Worsening since onset SEVERITY: Severe QUALITY: Headache, pressure CONTEXT: Patient states she began having frontal sinus pressure with drainage approximately 2 days prior to arrival and is been worsening since onset MODIFYING FACTORS: Denies any exacerbating or relieving factors ASSOCIATED SYMPTOMS: Rhinorrhea, ear fullness, tinnitus, throat, nausea, and lightheadedness   Per medical record review, patient has history of of fibromyalgia          Past Medical History:  Diagnosis Date   Arthritis    Asthma    Chronic back pain    Chronic back pain    scoliosis and 2 buldging disc   Depression    Diastolic dysfunction    Diverticulosis    Dysrhythmia    PALPITATIONS   Fibromyalgia    Gastritis    GERD (gastroesophageal reflux disease)    takes Protonix daily   Headache(784.0)    sinus related   Hyperlipidemia    just diagnosed;will follow up in 6wks to discuss meds   Hypertension    takes Lisinopril daily   Hypothyroidism    takes Synthroid daily   Ischemic colitis ([place])    Joint pain    Nocturia    Obesity    Vitamin D deficiency    just placed on Vit D this week    Patient Active Problem List   Diagnosis Date Noted   HNP (herniated nucleus pulposus), thoracic 04/24/2018   Pain in joint, shoulder region 11/22/2013   Shoulder weakness 11/22/2013   Diarrhea 05/02/2011   Generalized weakness 05/02/2011   Dehydration 05/02/2011   HTN  (hypertension) 05/02/2011   Hypothyroidism 05/02/2011   GERD (gastroesophageal reflux disease) 05/02/2011    Past Surgical History:  Procedure Laterality Date   ABDOMINAL HYSTERECTOMY     APPENDECTOMY     BACK SURGERY     x 3   CARDIAC CATHETERIZATION  04/13/2010   normal cororanies, EF 55% ([facility]; Dr. Clayborn Bigness)   CHOLECYSTECTOMY     COLONOSCOPY     COLONOSCOPY WITH PROPOFOL N/A 01/21/2017   Procedure: COLONOSCOPY WITH PROPOFOL;  Surgeon: Lollie Sails, MD;  Location: [facility] ENDOSCOPY;  Service: Endoscopy;  Laterality: N/A;   CORONARY ANGIOPLASTY     ESOPHAGOGASTRODUODENOSCOPY  06/12/2011   ESOPHAGOGASTRODUODENOSCOPY (EGD) WITH PROPOFOL N/A 01/21/2017   Procedure: ESOPHAGOGASTRODUODENOSCOPY (EGD) WITH PROPOFOL;  Surgeon: Lollie Sails, MD;  Location: [facility] ENDOSCOPY;  Service: Endoscopy;  Laterality: N/A;   FOOT SURGERY Left    d/t neuroma   OOPHORECTOMY     radiation to thyroid     RIGHT/LEFT HEART CATH AND CORONARY ANGIOGRAPHY Bilateral 10/22/2016   Procedure: RIGHT/LEFT HEART CATH AND CORONARY ANGIOGRAPHY;  Surgeon: Yolonda Kida, MD;  Location: [place] [facility];  Service: Cardiovascular;  Laterality: Bilateral;   SPINAL CORD STIMULATOR INSERTION N/A 07/16/2013   Procedure: LUMBAR SPINAL CORD STIMULATOR INSERTION;  Surgeon: Bonna Gains, MD;  Location: [place];  Service: Neurosurgery;  Laterality: N/A;   THORACIC DISCECTOMY Left 04/24/2018   Procedure: Left Thoracic ten-  thoracic eleven Microdiscectomy, Removal of Spinal Cord Stimulator;  Surgeon: Kristeen Miss, MD;  Location: [place];  Service: Neurosurgery;  Laterality: Left;  Left Thoracic ten- thoracic eleven Microdiscectomy, Removal of Spinal Cord Stimulator    Prior to Admission medications   Medication Sig Start Date End Date Taking? Authorizing Provider  azithromycin (ZITHROMAX Z-PAK) 250 MG tablet Take 2 tablets (500 mg) on  Day 1,  followed by 1 tablet (250 mg) once daily on Days 2 through 5. 11/08/20 11/13/20  Yes Imagene Boss, Vista Lawman, MD  Phenylephrine-APAP-guaiFENesin 5-325-200 MG CAPS Take 1 capsule by mouth every 8 (eight) hours as needed for up to 7 days (sinus congestion). 11/08/20 11/15/20 Yes Naaman Plummer, MD  citalopram (CELEXA) 10 MG tablet Take 1 tablet (10 mg total) by mouth daily. 11/11/18 11/11/19  Suella Broad, FNP  cloNIDine (CATAPRES) 0.1 MG tablet Take 0.1 mg by mouth at bedtime.    [provider]  diazepam (VALIUM) 5 MG tablet Take 5 mg by mouth 3 (three) times daily.    [provider]  levothyroxine (SYNTHROID, LEVOTHROID) 75 MCG tablet Take 75 mcg by mouth daily before breakfast.    [provider]  lisinopril (PRINIVIL,ZESTRIL) 40 MG tablet Take 40 mg by mouth daily.    [provider]  mirtazapine (REMERON) 15 MG tablet Take 1 tablet (15 mg total) by mouth at bedtime. 11/11/18   Starkes-Perry, Gayland Curry, FNP  pantoprazole (PROTONIX) 40 MG tablet Take 40 mg by mouth daily.    [provider]    Allergies Patient has no known allergies.  Family History  Problem Relation Age of Onset   Breast cancer Neg Hx     Social History Social History   Tobacco Use   Smoking status: Former    Packs/day: 0.50    Years: 0.00    Pack years: 0.00    Types: Cigarettes    Quit date: 06/20/1986    Years since quitting: 34.4   Smokeless tobacco: Never  Vaping Use   Vaping Use: Never used  Substance Use Topics   Alcohol use: No   Drug use: No    Review of Systems Constitutional: No fever/chills Eyes: No visual changes. ENT: Endorses sore throat, ear fullness, and tinnitus Cardiovascular: Denies chest pain.  Endorses orthostatic lightheadedness Respiratory: Denies shortness of breath. Gastrointestinal: No abdominal pain.  No nausea, no vomiting.  No diarrhea. Genitourinary: Negative for dysuria. Musculoskeletal: Negative for acute arthralgias Skin: Negative for rash. Neurological: Negative for headaches,  weakness/numbness/paresthesias in any extremity Psychiatric: Negative for suicidal ideation/homicidal ideation   ____________________________________________   PHYSICAL EXAM:  VITAL SIGNS: ED Triage Vitals  Enc Vitals Group     BP 11/08/20 0745 (!) 180/86     Pulse Rate 11/08/20 0745 78     Resp 11/08/20 0745 16     Temp 11/08/20 0745 98.3 F (36.8 C)     Temp Source 11/08/20 0745 Oral     SpO2 11/08/20 0745 94 %     Weight 11/08/20 0739 175 lb 0.7 oz (79.4 kg)     Height 11/08/20 0739 '5\' 5"'$  (1.651 m)     Head Circumference --      Peak Flow --      Pain Score 11/08/20 0739 6     Pain Loc --      Pain Edu? --      Excl. in [place]? --    Constitutional: Alert and oriented. Well appearing and in no acute distress. Eyes/ears:  Conjunctivae are injected. PERRL.  Fluid behind bilateral TM without erythema or bulging of the TM Head: Atraumatic. Nose: No congestion/rhinnorhea. Mouth/Throat: Mucous membranes are moist.  Erythematous posterior oropharynx Neck: No stridor Cardiovascular: Grossly normal heart sounds.  Good peripheral circulation. Respiratory: Normal respiratory effort.  No retractions. Gastrointestinal: Soft and nontender. No distention. Musculoskeletal: No obvious deformities Neurologic:  Normal speech and language. No gross focal neurologic deficits are appreciated. Skin:  Skin is warm and dry. No rash noted. Psychiatric: Mood and affect are normal. Speech and behavior are normal.  ____________________________________________   LABS (all labs ordered are listed, but only abnormal results are displayed)  Labs Reviewed  COMPREHENSIVE METABOLIC PANEL - Abnormal; Notable for the following components:      Result Value   CO2 19 (*)    Glucose, Bld 199 (*)    Creatinine, Ser 1.24 (*)    GFR, Estimated 45 (*)    All other components within normal limits  LACTIC ACID, PLASMA - Abnormal; Notable for the following components:   Lactic Acid, Venous 2.7 (*)    All  other components within normal limits  RESP PANEL BY RT-PCR (FLU A&B, COVID) ARPGX2  LIPASE, BLOOD  CBC WITH DIFFERENTIAL/PLATELET  URINALYSIS, COMPLETE (UACMP) WITH MICROSCOPIC   RADIOLOGY  ED MD interpretation: CT of the head without contrast shows no evidence of acute abnormalities including no intracerebral hemorrhage, obvious masses, or significant edema  Official radiology report(s): CT Head Wo Contrast  Result Date: 11/08/2020 CLINICAL DATA:  Headache, intracranial hemorrhage suspected EXAM: CT HEAD WITHOUT CONTRAST TECHNIQUE: Contiguous axial images were obtained from the base of the skull through the vertex without intravenous contrast. COMPARISON:  2012 FINDINGS: Brain: There is no acute intracranial hemorrhage, mass effect, or edema. Gray-white differentiation is preserved. There is no extra-axial fluid collection. Ventricles and sulci are within normal limits in size and configuration. Vascular: There is atherosclerotic calcification at the skull base. Skull: Calvarium is unremarkable. Sinuses/Orbits: No acute finding. Other: None. IMPRESSION: No acute intracranial abnormality. Electronically Signed   By: Macy Mis M.D.   On: 11/08/2020 11:33    ____________________________________________   PROCEDURES  Procedure(s) performed (including Critical Care):  Procedures   ____________________________________________   INITIAL IMPRESSION / ASSESSMENT AND PLAN / ED COURSE  As part of my medical decision making, I reviewed the following data within the electronic medical record, if available:  Nursing notes reviewed and incorporated, Labs reviewed, EKG interpreted, Old chart reviewed, Radiograph reviewed and Notes from prior ED visits reviewed and incorporated        Otherwise healthy patient presenting with constellation of symptoms likely representing uncomplicated viral upper respiratory symptoms as characterized by mild sinusitis  Unlikely PTA/RPA: no hot potato  voice, no uvular deviation, Unlikely Esophageal rupture: No history of dysphagia Unlikely deep space infection/Ludwigs Low suspicion for CNS infection bacterial sinusitis, or pneumonia given exam and history.  Unlikely Strep or EBV as centor negative and with no pharyngeal exudate, posterior LAD, or splenomegaly.  Will attempt to alleviate symptoms conservatively; no overt indications at this time for antibiotics. No respiratory distress, otherwise relatively well appearing and nontoxic. Will discuss prompt follow up with PMD and strict return precautions.      ____________________________________________   FINAL CLINICAL IMPRESSION(S) / ED DIAGNOSES  Final diagnoses:  Acute non-recurrent frontal sinusitis  Upset stomach     ED Discharge Orders          Ordered    azithromycin (ZITHROMAX Z-PAK) 250 MG tablet  11/08/20 1240    Phenylephrine-APAP-guaiFENesin 5-325-200 MG CAPS  Every 8 hours PRN        11/08/20 1240             Note:  This document was prepared using Dragon voice recognition software and may include unintentional dictation errors.    Naaman Plummer, MD 11/08/20 1556

## 2020-11-08 NOTE — ED Triage Notes (Signed)
Pt comes into the ED via POV c/o headache, ringing of the ears and discomfort, as well as nausea and abd pain that started Monday.  Pt currently in NAD with even and unlabored respirations.

## 2020-11-08 NOTE — ED Notes (Signed)
Pt to CT at this time.

## 2020-11-08 NOTE — ED Notes (Signed)
MD Bradler informed of pt's Lac of 2.7 at this time.

## 2020-11-16 ENCOUNTER — Emergency Department: Payer: Medicare HMO

## 2020-11-16 ENCOUNTER — Encounter: Payer: Self-pay | Admitting: Emergency Medicine

## 2020-11-16 ENCOUNTER — Other Ambulatory Visit: Payer: Self-pay

## 2020-11-16 ENCOUNTER — Emergency Department
Admission: EM | Admit: 2020-11-16 | Discharge: 2020-11-16 | Disposition: A | Discharge status: Home / Self Care | Payer: Medicare HMO | Attending: Emergency Medicine | Admitting: Emergency Medicine

## 2020-11-16 DIAGNOSIS — Z87891 Personal history of nicotine dependence: Secondary | ICD-10-CM | POA: Diagnosis not present

## 2020-11-16 DIAGNOSIS — R42 Dizziness and giddiness: Secondary | ICD-10-CM | POA: Insufficient documentation

## 2020-11-16 DIAGNOSIS — Z955 Presence of coronary angioplasty implant and graft: Secondary | ICD-10-CM | POA: Insufficient documentation

## 2020-11-16 DIAGNOSIS — J45909 Unspecified asthma, uncomplicated: Secondary | ICD-10-CM | POA: Insufficient documentation

## 2020-11-16 DIAGNOSIS — E039 Hypothyroidism, unspecified: Secondary | ICD-10-CM | POA: Insufficient documentation

## 2020-11-16 DIAGNOSIS — Z20822 Contact with and (suspected) exposure to covid-19: Secondary | ICD-10-CM | POA: Insufficient documentation

## 2020-11-16 DIAGNOSIS — R0981 Nasal congestion: Secondary | ICD-10-CM | POA: Diagnosis not present

## 2020-11-16 DIAGNOSIS — Z79899 Other long term (current) drug therapy: Secondary | ICD-10-CM | POA: Insufficient documentation

## 2020-11-16 DIAGNOSIS — H9313 Tinnitus, bilateral: Secondary | ICD-10-CM | POA: Diagnosis not present

## 2020-11-16 DIAGNOSIS — I10 Essential (primary) hypertension: Secondary | ICD-10-CM | POA: Insufficient documentation

## 2020-11-16 LAB — RESP PANEL BY RT-PCR (FLU A&B, COVID) ARPGX2
Influenza A by PCR: NEGATIVE
Influenza B by PCR: NEGATIVE
SARS Coronavirus 2 by RT PCR: NEGATIVE

## 2020-11-16 MED ORDER — FLUTICASONE PROPIONATE 50 MCG/ACT NA SUSP: 2.0000 | Freq: Every day | NASAL | 2 refills | Status: DC

## 2020-11-16 MED ORDER — CETIRIZINE HCL 10 MG PO TABS: 10.0000 mg | ORAL_TABLET | Freq: Every day | ORAL | 2 refills | Status: DC

## 2020-11-16 MED ORDER — MECLIZINE HCL 25 MG PO TABS
25.0000 mg | ORAL_TABLET | Freq: Once | ORAL | Status: AC
  Administered 2020-11-16: 25 mg via ORAL
  Filled 2020-11-16: qty 1

## 2020-11-16 NOTE — ED Provider Notes (Signed)
[facility] Emergency Department Provider Note ____________________________________________   Event Date/Time   First MD Initiated Contact with Patient 11/16/20 [phone]     (approximate)  I have reviewed the triage vital signs and the nursing notes.   HISTORY  Chief Complaint Nausea, Sinus Problem, and Nasal Congestion  HPI Madison Jennings is a 80 y.o. female with history of hypertension, and remaining history as listed below presents to the emergency department for treatment and evaluation of dizziness, sinus pressure and ringing in her ears for the past few months. She has been treated with antibiotics for sinus infection. Symptoms are persistent and get worse with position change.         Past Medical History:  Diagnosis Date   Arthritis    Asthma    Chronic back pain    Chronic back pain    scoliosis and 2 buldging disc   Depression    Diastolic dysfunction    Diverticulosis    Dysrhythmia    PALPITATIONS   Fibromyalgia    Gastritis    GERD (gastroesophageal reflux disease)    takes Protonix daily   Headache(784.0)    sinus related   Hyperlipidemia    just diagnosed;will follow up in 6wks to discuss meds   Hypertension    takes Lisinopril daily   Hypothyroidism    takes Synthroid daily   Ischemic colitis ([place])    Joint pain    Nocturia    Obesity    Vitamin D deficiency    just placed on Vit D this week    Patient Active Problem List   Diagnosis Date Noted   HNP (herniated nucleus pulposus), thoracic 04/24/2018   Pain in joint, shoulder region 11/22/2013   Shoulder weakness 11/22/2013   Diarrhea 05/02/2011   Generalized weakness 05/02/2011   Dehydration 05/02/2011   HTN (hypertension) 05/02/2011   Hypothyroidism 05/02/2011   GERD (gastroesophageal reflux disease) 05/02/2011    Past Surgical History:  Procedure Laterality Date   ABDOMINAL HYSTERECTOMY     APPENDECTOMY     BACK SURGERY     x 3   CARDIAC CATHETERIZATION   04/13/2010   normal cororanies, EF 55% ([facility]; Dr. Clayborn Bigness)   CHOLECYSTECTOMY     COLONOSCOPY     COLONOSCOPY WITH PROPOFOL N/A 01/21/2017   Procedure: COLONOSCOPY WITH PROPOFOL;  Surgeon: Lollie Sails, MD;  Location: [facility] ENDOSCOPY;  Service: Endoscopy;  Laterality: N/A;   CORONARY ANGIOPLASTY     ESOPHAGOGASTRODUODENOSCOPY  06/12/2011   ESOPHAGOGASTRODUODENOSCOPY (EGD) WITH PROPOFOL N/A 01/21/2017   Procedure: ESOPHAGOGASTRODUODENOSCOPY (EGD) WITH PROPOFOL;  Surgeon: Lollie Sails, MD;  Location: [facility] ENDOSCOPY;  Service: Endoscopy;  Laterality: N/A;   FOOT SURGERY Left    d/t neuroma   OOPHORECTOMY     radiation to thyroid     RIGHT/LEFT HEART CATH AND CORONARY ANGIOGRAPHY Bilateral 10/22/2016   Procedure: RIGHT/LEFT HEART CATH AND CORONARY ANGIOGRAPHY;  Surgeon: Yolonda Kida, MD;  Location: [place] [facility];  Service: Cardiovascular;  Laterality: Bilateral;   SPINAL CORD STIMULATOR INSERTION N/A 07/16/2013   Procedure: LUMBAR SPINAL CORD STIMULATOR INSERTION;  Surgeon: Bonna Gains, MD;  Location: [place];  Service: Neurosurgery;  Laterality: N/A;   THORACIC DISCECTOMY Left 04/24/2018   Procedure: Left Thoracic ten- thoracic eleven Microdiscectomy, Removal of Spinal Cord Stimulator;  Surgeon: Kristeen Miss, MD;  Location: [place];  Service: Neurosurgery;  Laterality: Left;  Left Thoracic ten- thoracic eleven Microdiscectomy, Removal of Spinal Cord Stimulator  Prior to Admission medications   Medication Sig Start Date End Date Taking? Authorizing Provider  cetirizine (ZYRTEC ALLERGY) 10 MG tablet Take 1 tablet (10 mg total) by mouth daily. 11/16/20 11/16/21 Yes Zanasia Hickson B, FNP  fluticasone (FLONASE) 50 MCG/ACT nasal spray Place 2 sprays into both nostrils daily. 11/16/20 11/16/21 Yes Fareeha Evon B, FNP  citalopram (CELEXA) 10 MG tablet Take 1 tablet (10 mg total) by mouth daily. 11/11/18 11/11/19  Suella Broad, FNP  cloNIDine (CATAPRES) 0.1 MG tablet Take  0.1 mg by mouth at bedtime.    [provider]  diazepam (VALIUM) 5 MG tablet Take 5 mg by mouth 3 (three) times daily.    [provider]  levothyroxine (SYNTHROID, LEVOTHROID) 75 MCG tablet Take 75 mcg by mouth daily before breakfast.    [provider]  lisinopril (PRINIVIL,ZESTRIL) 40 MG tablet Take 40 mg by mouth daily.    [provider]  mirtazapine (REMERON) 15 MG tablet Take 1 tablet (15 mg total) by mouth at bedtime. 11/11/18   Starkes-Perry, Gayland Curry, FNP  pantoprazole (PROTONIX) 40 MG tablet Take 40 mg by mouth daily.    [provider]    Allergies Patient has no known allergies.  Family History  Problem Relation Age of Onset   Breast cancer Neg Hx     Social History Social History   Tobacco Use   Smoking status: Former    Packs/day: 0.50    Years: 0.00    Pack years: 0.00    Types: Cigarettes    Quit date: 06/20/1986    Years since quitting: 34.4   Smokeless tobacco: Never  Vaping Use   Vaping Use: Never used  Substance Use Topics   Alcohol use: No   Drug use: No    Review of Systems  Constitutional: No fever/chills Eyes: No visual changes. ENT: Positive for ringing bilateral ears, intermittent sore throat, sinus pressure Cardiovascular: Denies chest pain. Respiratory: Denies shortness of breath. Gastrointestinal: No abdominal pain.  No nausea, no vomiting.  No diarrhea.  No constipation. Genitourinary: Negative for dysuria. Musculoskeletal: Negative for back pain. Skin: Negative for rash. Neurological: Negative for headaches, focal weakness or numbness. ____________________________________________   PHYSICAL EXAM:  VITAL SIGNS: ED Triage Vitals [11/16/20 0552]  Enc Vitals Group     BP 135/67     Pulse Rate 60     Resp 18     Temp 97.9 F (36.6 C)     Temp Source Oral     SpO2 97 %     Weight 201 lb (91.2 kg)     Height 5\' 4"  (1.626 m)     Head Circumference      Peak Flow      Pain Score 5      Pain Loc      Pain Edu?      Excl. in [place]?     Constitutional: Alert and oriented. Well appearing and in no acute distress. Eyes: Conjunctivae are normal. PERRL. EOMI. Head: Atraumatic. Nose: No congestion/rhinnorhea. No sinus tenderness Mouth/Throat: Mucous membranes are moist.  Oropharynx non-erythematous. Uvula is midline. Neck: No stridor.   Hematological/Lymphatic/Immunilogical: No cervical lymphadenopathy. Cardiovascular: Normal rate, regular rhythm. Grossly normal heart sounds.  Good peripheral circulation. Respiratory: Normal respiratory effort.  No retractions. Lungs CTAB. Gastrointestinal: Soft and nontender. No distention. No abdominal bruits. No CVA tenderness. Genitourinary:  Musculoskeletal: No lower extremity tenderness nor edema.  No joint effusions. Neurologic:  Normal speech and language. No gross focal  neurologic deficits are appreciated. No gait instability. Skin:  Skin is warm, dry and intact. No rash noted. Psychiatric: Mood and affect are normal. Speech and behavior are normal.  ____________________________________________   LABS (all labs ordered are listed, but only abnormal results are displayed)  Labs Reviewed  RESP PANEL BY RT-PCR (FLU A&B, COVID) [id number]   ____________________________________________  EKG  ____________________________________________  RADIOLOGY  ED MD interpretation:    MR brain without contrast without acute concerns.  I, Sherrie George, personally viewed and evaluated these images (plain radiographs) as part of my medical decision making, as well as reviewing the written report by the radiologist.  Official radiology report(s): MR BRAIN WO CONTRAST  Result Date: 11/16/2020 CLINICAL DATA:  Dizziness, non-specific EXAM: MRI HEAD WITHOUT CONTRAST TECHNIQUE: Multiplanar, multiecho pulse sequences of the brain and surrounding structures were obtained without intravenous contrast. COMPARISON:  CT head 11/08/2020.  MRI 09/24/2004.  FINDINGS: Brain: No acute infarction, hemorrhage, hydrocephalus, extra-axial collection or mass lesion. Mild scattered T2 hyperintensities in the white matter, nonspecific but compatible with chronic microvascular ischemic disease that is very mild for patient age. Small remote right cerebellar lacunar infarcts. Vascular: Major arterial flow voids are maintained at the skull base. Skull and upper cervical spine: Normal marrow signal. Sinuses/Orbits: Clear sinuses.  Unremarkable orbits. Other: No sizable mastoid effusions. IMPRESSION: 1. No evidence of acute intracranial abnormality. 2. Small remote right cerebellar lacunar infarcts. Electronically Signed   By: Margaretha Sheffield M.D.   On: 11/16/2020 11:00    ____________________________________________   PROCEDURES  Procedure(s) performed (including Critical Care):  Procedures  ____________________________________________   INITIAL IMPRESSION / ASSESSMENT AND PLAN     80 year old female presenting with persistent dizziness and tinnitus. See HPI for details.  She was evaluated here on 11/08/20 and had workup that included labs and head CT, which were without acute concerns. She was discharged home to follow up with primary care. She did follow up and prescribed Flonase with instruction to return if symptoms were not better in a week. Today, dizziness and ringing seems worse.  DIFFERENTIAL DIAGNOSIS  Vertigo/Meniere's, CVA  ED COURSE  MR Brain without acute concerns today. Meclizine given without improvement. Flonase and Cetirizine prescribed. She is to call and schedule a follow up with ENT. Patient and family agreeable to the plan.    ___________________________________________   FINAL CLINICAL IMPRESSION(S) / ED DIAGNOSES  Final diagnoses:  Tinnitus of both ears  Nonspecific dizziness     ED Discharge Orders          Ordered    fluticasone (FLONASE) 50 MCG/ACT nasal spray  Daily        11/16/20 1116    cetirizine  (ZYRTEC ALLERGY) 10 MG tablet  Daily        11/16/20 1116             COLETTA LOCKNER was evaluated in Emergency Department on 11/16/2020 for the symptoms described in the history of present illness. She was evaluated in the context of the global COVID-19 pandemic, which necessitated consideration that the patient might be at risk for infection with the SARS-CoV-2 virus that causes COVID-19. Institutional protocols and algorithms that pertain to the evaluation of patients at risk for COVID-19 are in a state of rapid change based on information released by regulatory bodies including the CDC and federal and state organizations. These policies and algorithms were followed during the patient's care in the ED.   Note:  This document was prepared using Dragon  voice recognition software and may include unintentional dictation errors.    Victorino Dike, FNP 11/16/20 1240    Harvest Dark, MD 11/16/20 1520

## 2020-11-16 NOTE — Discharge Instructions (Signed)
Please call and schedule a follow up with the ENT specialist.  Return to the ER for symptoms that change, worsen, or for new concerns if unable to schedule an appointment with primary care or the specialist.

## 2020-11-16 NOTE — ED Triage Notes (Signed)
Pt to ED from home c/o headache, ringing in her ears and full sensation, nausea without vomiting.  Pt seen here recently for same, states medications not helping.  Pt A&Ox4, chest rise even and unlabored, skin WNL, in NAD at this time.

## 2020-11-16 NOTE — ED Notes (Signed)
See triage note. Pt c/o HA, nausea and sinus pressure. Pt stating recently seen for same. Pt stating was sent home with prescriptions but no improvement.

## 2020-11-28 ENCOUNTER — Other Ambulatory Visit: Payer: Self-pay | Admitting: Internal Medicine

## 2020-11-28 DIAGNOSIS — Z1231 Encounter for screening mammogram for malignant neoplasm of breast: Secondary | ICD-10-CM

## 2020-11-30 ENCOUNTER — Ambulatory Visit
Payer: Medicare HMO | Attending: Student in an Organized Health Care Education/Training Program | Admitting: Student in an Organized Health Care Education/Training Program

## 2020-11-30 ENCOUNTER — Encounter: Payer: Self-pay | Admitting: Student in an Organized Health Care Education/Training Program

## 2020-11-30 ENCOUNTER — Other Ambulatory Visit: Payer: Self-pay

## 2020-11-30 VITALS — BP 121/69 | HR 56 | Temp 97.1°F | Resp 16 | Ht 64.0 in | Wt 226.0 lb

## 2020-11-30 DIAGNOSIS — Z981 Arthrodesis status: Secondary | ICD-10-CM | POA: Insufficient documentation

## 2020-11-30 DIAGNOSIS — M48062 Spinal stenosis, lumbar region with neurogenic claudication: Secondary | ICD-10-CM | POA: Insufficient documentation

## 2020-11-30 DIAGNOSIS — M5416 Radiculopathy, lumbar region: Secondary | ICD-10-CM | POA: Diagnosis not present

## 2020-11-30 DIAGNOSIS — G8929 Other chronic pain: Secondary | ICD-10-CM | POA: Insufficient documentation

## 2020-11-30 DIAGNOSIS — M961 Postlaminectomy syndrome, not elsewhere classified: Secondary | ICD-10-CM | POA: Insufficient documentation

## 2020-11-30 DIAGNOSIS — G894 Chronic pain syndrome: Secondary | ICD-10-CM | POA: Diagnosis present

## 2020-11-30 MED ORDER — BUPRENORPHINE 5 MCG/HR TD PTWK: 1.0000 | MEDICATED_PATCH | TRANSDERMAL | 0 refills | Status: DC

## 2020-11-30 NOTE — Progress Notes (Signed)
Safety precautions to be maintained throughout the outpatient stay will include: orient to surroundings, keep bed in low position, maintain call bell within reach at all times, provide assistance with transfer out of bed and ambulation.  

## 2020-11-30 NOTE — Progress Notes (Signed)
Patient: Madison Jennings  Service Category: E/M  Provider: [name], MD  DOB: Aug 24, 1940  DOS: 11/30/2020  Referring Provider: [name], MD  MRN: [id number]  Setting: Ambulatory outpatient  PCP: Madison Lighter, MD  Type: New Patient  Specialty: Interventional Pain Management    Location: Office  Delivery: Face-to-face     Primary Reason(s) for Visit: Encounter for initial evaluation of one or more chronic problems (new to examiner) potentially causing chronic pain, and posing a threat to normal musculoskeletal function. (Level of risk: High) CC: Back Pain (Mid to lower)  HPI  Madison Jennings is a 80 y.o. year old, female patient, who comes for the first time to our practice referred by Madison Lighter, MD for our initial evaluation of her chronic pain. She has Diarrhea; Generalized weakness; Dehydration; HTN (hypertension); Hypothyroidism; GERD (gastroesophageal reflux disease); Pain in joint, shoulder region; Shoulder weakness; and HNP (herniated nucleus pulposus), thoracic on their problem list. Today she comes in for evaluation of her Back Pain (Mid to lower)  Pain Assessment: Location: Mid, Lower Back Radiating: to thighs bilat Onset: More than a month ago Duration: Chronic pain Quality: Constant Severity: 7 /10 (subjective, self-reported pain score)  Effect on ADL: limits daily activities, can't wash dishes or  stand for any period of time Timing: Constant Modifying factors: OTC, heat, ice BP: 121/69  HR: (!) 56  Onset and Duration: Gradual and Present longer than 3 months Cause of pain: Unknown Severity: Getting worse, NAS-11 at its worse: 9/10, NAS-11 at its best: 7/10, NAS-11 now: 7/10, and NAS-11 on the average: 9/10 Timing: Not influenced by the time of the day, During activity or exercise, and After activity or exercise Aggravating Factors:  nothing Alleviating Factors: Lying down and Medications Associated Problems: Depression, Dizziness, Fatigue, Weakness, and  Pain that wakes patient up Quality of Pain: Agonizing, Disabling, Distressing, Dreadful, Exhausting, Horrible, Pulsating, and Tiring Previous Examinations or Tests: Ct-Myelogram, Myelogram, and X-rays Previous Treatments: Epidural steroid injections, Facet blocks, Narcotic medications, Physical Therapy, Pool exercises, Spinal cord stimulator, Steroid treatments by mouth, Strengthening exercises, Stretching exercises, and TENS  Madison Jennings is a pleasant 80 year old female who presents with a chief complaint of mid thoracic, lumbar spine pain that radiates into bilateral lower extremities in a dermatomal fashion.  She has a history of lumbar spine surgery x5 along with lumbar spinal fusion.  She has tried physical therapy in the past, multiple epidural steroid injections, facet interventional blocks along with spinal cord stimulator which she had implanted and subsequently explanted 3 years later due to complications and lack of analgesic and functional benefit with it.  She is fairly limited in her ability to do ADLs.  She has pain and weakness when she stands up for more than a couple of minutes.  She ambulates with a cane.  Her husband has accompanied her today and states that he does most of the activities around the house.  She states that she has no quality of life and is hoping to improve that.  She is currently on Valium for anxiety and muscle spasms.  She is referred to psychiatry to find an alternative to this.  I think this is a good idea.  Of note patient has tried hydrocodone, oxycodone, tramadol in the past with lack of analgesic response.  She has not tried buprenorphine.  She is not interested in any additional injections.  She is uninterested in physical therapy as she has done many times in the past without any benefit  Historic Controlled Substance Pharmacotherapy Review  PMP and historical list of controlled substances:  N.A.  Risk Assessment Profile: Aberrant behavior: None observed  or detected today Risk factors for fatal opioid overdose: Benzodiazepine use Fatal overdose hazard ratio (HR): Calculation deferred Non-fatal overdose hazard ratio (HR): Calculation deferred Risk of opioid abuse or dependence: 0.7-3.0% with doses ? 36 MME/day and 6.1-26% with doses ? 120 MME/day. Substance use disorder (SUD) risk level: See below Personal History of Substance Abuse (SUD-Substance use disorder):  Alcohol: Negative  Illegal Drugs: Negative  Rx Drugs: Negative  ORT Risk Level calculation: Low Risk  Opioid Risk Tool - 11/30/20 1316       Family History of Substance Abuse   Alcohol Negative    Illegal Drugs Negative    Rx Drugs Negative      Personal History of Substance Abuse   Alcohol Negative    Illegal Drugs Negative    Rx Drugs Negative      Age   Age between 16-45 years  No      Psychological Disease   Psychological Disease Negative    Depression Positive      Total Score   Opioid Risk Tool Scoring 1    Opioid Risk Interpretation Low Risk            ORT Scoring interpretation table:  Score <3 = Low Risk for SUD  Score between 4-7 = Moderate Risk for SUD  Score >8 = High Risk for Opioid Abuse   PHQ-2 Depression Scale:  Total score:    PHQ-2 Scoring interpretation table: (Score and probability of major depressive disorder)  Score 0 = No depression  Score 1 = 15.4% Probability  Score 2 = 21.1% Probability  Score 3 = 38.4% Probability  Score 4 = 45.5% Probability  Score 5 = 56.4% Probability  Score 6 = 78.6% Probability   PHQ-9 Depression Scale:  Total score:    PHQ-9 Scoring interpretation table:  Score 0-4 = No depression  Score 5-9 = Mild depression  Score 10-14 = Moderate depression  Score 15-19 = Moderately severe depression  Score 20-27 = Severe depression (2.4 times higher risk of SUD and 2.89 times higher risk of overuse)   Pharmacologic Plan: As per protocol, I have not taken over any controlled substance management, pending the  results of ordered tests and/or consults.            Initial impression: Pending review of available data and ordered tests.  Meds   Current Outpatient Medications:    acetaminophen (TYLENOL) 500 MG tablet, Take 500 mg by mouth every 6 (six) hours as needed., Disp: , Rfl:    amLODipine (NORVASC) 2.5 MG tablet, Take 2.5 mg by mouth daily., Disp: , Rfl:    buprenorphine (BUTRANS) 5 MCG/HR PTWK, Place 1 patch onto the skin once a week for 28 days., Disp: 4 patch, Rfl: 0   BUPROPION HCL ER, XL, PO, Take by mouth., Disp: , Rfl:    diazepam (VALIUM) 5 MG tablet, Take 5 mg by mouth 3 (three) times daily., Disp: , Rfl:    esomeprazole (NEXIUM) 40 MG capsule, Take 40 mg by mouth daily at 12 noon., Disp: , Rfl:    hydrOXYzine (ATARAX/VISTARIL) 25 MG tablet, Take 25 mg by mouth 3 (three) times daily as needed., Disp: , Rfl:    ibuprofen (ADVIL) 200 MG tablet, Take 200 mg by mouth every 6 (six) hours as needed., Disp: , Rfl:    levothyroxine (SYNTHROID, LEVOTHROID) 75  MCG tablet, Take 75 mcg by mouth daily before breakfast., Disp: , Rfl:    lisinopril (PRINIVIL,ZESTRIL) 40 MG tablet, Take 40 mg by mouth daily., Disp: , Rfl:   Imaging Review     CT CERVICAL SPINE W CONTRAST  Narrative CLINICAL DATA:  Previous lumbosacral fusion. Previous neurostimulator. Spinal stenosis. Back and leg pain. Shoulder and arm pain.  FLUOROSCOPY TIME:  0 minutes 48 seconds. 1005.10 micro gray meter squared  PROCEDURE: LUMBAR PUNCTURE FOR CERVICAL LUMBAR AND THORACIC MYELOGRAM  CERVICAL AND LUMBAR AND THORACIC MYELOGRAM  CT CERVICAL MYELOGRAM  CT LUMBAR MYELOGRAM  CT THORACIC MYELOGRAM  Lumbar puncture and contrast injection was performed by Dr. Ellene Route.  I personally performed the  acquisition of the myelogram images.  TECHNIQUE: Contiguous axial images were obtained through the Cervical, Thoracic, and Lumbar spine after the intrathecal infusion of infusion. Coronal and sagittal reconstructions were  obtained of the axial image sets.  FINDINGS: CERVICAL AND LUMBAR MYELOGRAM FINDINGS:  Lumbar puncture was done at the L2-3 level. There is been previous discectomy and fusion from L4 to the sacrum with pedicle screws and posterior rods. Screw fractures noted on the right at S1. There is no central canal stenosis. Disc degeneration at L1-2 with vacuum phenomenon but no apparent compressive stenosis. Chronic disc space narrowing L2-3 and L3-4. 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  In the cervical region, there are anterior extradural defects from C3-4 through C6-7. Slightly diminished root sleeve filling on both sides at those levels. No critical stenosis seen.  CT CERVICAL MYELOGRAM FINDINGS:  Foramen magnum is widely patent.  C1-2 and C2-3 are normal.  C3-4: Mild bulging of the disc and mild uncovertebral prominence. No canal or foraminal stenosis.  C4-5: Mild bulging of the disc and mild uncovertebral prominence. Mild facet degeneration on the left. No compressive canal stenosis. Mild bilateral foraminal narrowing.  C5-6: Spondylosis with endplate osteophytes and bulging of the disc. Foraminal encroachment by osteophytes right more than left. The right C6 nerve could be affected.  C6-7: Spondylosis with endplate osteophytes and bulging of the disc. No compressive central canal stenosis. Mild osteophytic encroachment upon the foramina without likely compressive stenosis.  C7-T1: Facet osteoarthritis on the right which could be painful. No central canal stenosis. Mild right foraminal stenosis. Presumably benign sclerotic focus within the left side of the T1 vertebral body.  CT LUMBAR MYELOGRAM FINDINGS:  L1-2: Advanced disc degeneration of vacuum phenomenon. Endplate osteophytes and bulging of the disc. Facet and ligamentous hypertrophy more on the right. Right lateral recess stenosis that could possibly cause neural compression. Findings at this  level could certainly be associated with back pain.  L2-3: Solid fusion posteriorly. No disc pathology. No canal or foraminal stenosis.  L3-4: Solid fusion posteriorly. No disc pathology. Wide patency of the canal and foramina. Nerve root clumping suggesting arachnoiditis.  L4 to sacrum: Chronic solid fusion. Wide patency of the canal and foramina. Chronic screw fracture on the right at S1 but without evidence of regional motion. Some clumping of the nerve roots suggesting a degree of arachnoiditis.  CT THORACIC MYELOGRAM FINDINGS:  Mild curvature convex to the right. No antero or retrolisthesis. Neuro stimulators in place in the dorsal spinal canal from the T6-7 disc level to the T9-10 disc level. No significant finding at T7-8 or above. No disc bulge or herniation. Ordinary upper thoracic facet osteoarthritis.  T8-9: Disc bulge. No compressive stenosis. Mild facet degeneration.  T9-10: Pronounced disc degeneration with loss of disc height and  sclerotic endplate changes. Mild bulging of the disc. Mild narrowing of the lateral recesses. Bilateral foraminal stenosis. Findings at this level could be symptomatic.  T10-11: Disc degeneration with a left posterolateral to foraminal disc herniation. This indents the thecal sac. Left-sided neural compression possible at this level.  T11-12: Mild bulging of the disc.  No canal or foraminal stenosis.  T12-L1: Mild bulging of the disc. Mild facet and ligamentous hypertrophy. No compressive stenosis.  IMPRESSION: Cervical region: Degenerative spondylosis from C3-4 through C6-7. No compressive central canal stenosis. Osteophytic encroachment could potentially cause neural compression on the right at C5-6. Facet arthropathy on the right at C7-T1 with foraminal encroachment that could possibly affect the right C8 nerve. Foraminal narrowing at other levels appears mild to moderate at most without likely neural compression.  Thoracic  region: Neuro stimulators appear well positioned. Advanced degenerative spondylosis at T9-10 with sclerotic bone changes. Mild narrowing of the lateral recesses. Foraminal narrowing that could be symptomatic. T10-11 left posterolateral to foraminal disc herniation which could cause left-sided neural compression.  Lumbar region: Solid fusion from L2 to the sacrum with wide patency of the canal and foramina. Arachnoiditis pattern is present. Adjacent segment degenerative changes at L1-2 with degenerative spondylosis showing disc narrowing, vacuum phenomenon and chronic endplate changes. Bulging of the disc and facet osteoarthritis. Lateral recess narrowing on the right which could possibly cause neural compression. The findings at this level could be associated with lumbago.   Electronically Signed By: Nelson Chimes M.D. On: 03/31/2018 10:57   MR THORACIC SPINE WO CONTRAST  Narrative CLINICAL DATA:  Mid back pain with bilateral thoracic radiculopathy. Previous spinal cord stimulator has been removed.  EXAM: MRI THORACIC SPINE WITHOUT CONTRAST  TECHNIQUE: Multiplanar, multisequence MR imaging of the thoracic spine was performed. No intravenous contrast was administered.  COMPARISON:  Thoracic spine radiographs 07/25/2014  FINDINGS: Alignment:  Normal sagittal alignment.  Mild dextroscoliosis.  Vertebrae: Negative for fracture or mass. Bone marrow edema around the T9-10 disc space compatible with disc degeneration.  Cord:  Spinal cord signal normal.  Paraspinal and other soft tissues: Negative for paraspinous mass or edema.  Disc levels:  Mild disc degeneration throughout the upper thoracic spine with disc desiccation. Small right-sided disc protrusion at T3-4.  T8-9: Disc degeneration with mild spurring and mild facet degeneration. Negative for stenosis  T9-10: Disc degeneration with diffuse endplate spurring. Discogenic edema in the bone marrow. Bilateral facet  hypertrophy. Mild spinal stenosis and moderate foraminal stenosis bilaterally.  T10-11: Moderately large left-sided disc protrusion. Moderate facet hypertrophy bilaterally. Moderate spinal stenosis with cord flattening and moderate spinal stenosis. Marked left foraminal encroachment. Mild right foraminal encroachment due to spurring.  T11-12: Mild disc and facet degeneration without significant stenosis  T12-L1: Central and left-sided disc protrusion. Mild facet degeneration. Mild left foraminal stenosis.  IMPRESSION: Mild spinal stenosis T9-10 with moderate foraminal stenosis bilaterally  Moderately large left-sided disc protrusion at T10-11 with cord flattening and moderate spinal stenosis. Marked left foraminal encroachment.   Electronically Signed By: Franchot Gallo M.D. On: 10/25/2019 09:08   Narrative CLINICAL DATA:  Previous lumbosacral fusion. Previous neurostimulator. Spinal stenosis. Back and leg pain. Shoulder and arm pain.  FLUOROSCOPY TIME:  0 minutes 48 seconds. 1005.10 micro gray meter squared  PROCEDURE: LUMBAR PUNCTURE FOR CERVICAL LUMBAR AND THORACIC MYELOGRAM  CERVICAL AND LUMBAR AND THORACIC MYELOGRAM  CT CERVICAL MYELOGRAM  CT LUMBAR MYELOGRAM  CT THORACIC MYELOGRAM  Lumbar puncture and contrast injection was performed by Dr. Ellene Route.  I  personally performed the  acquisition of the myelogram images.  TECHNIQUE: Contiguous axial images were obtained through the Cervical, Thoracic, and Lumbar spine after the intrathecal infusion of infusion. Coronal and sagittal reconstructions were obtained of the axial image sets.  FINDINGS: CERVICAL AND LUMBAR MYELOGRAM FINDINGS:  Lumbar puncture was done at the L2-3 level. There is been previous discectomy and fusion from L4 to the sacrum with pedicle screws and posterior rods. Screw fractures noted on the right at S1. There is no central canal stenosis. Disc degeneration at L1-2 with  vacuum phenomenon but no apparent compressive stenosis. Chronic disc space narrowing L2-3 and L3-4. 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  In the cervical region, there are anterior extradural defects from C3-4 through C6-7. Slightly diminished root sleeve filling on both sides at those levels. No critical stenosis seen.  CT CERVICAL MYELOGRAM FINDINGS:  Foramen magnum is widely patent.  C1-2 and C2-3 are normal.  C3-4: Mild bulging of the disc and mild uncovertebral prominence. No canal or foraminal stenosis.  C4-5: Mild bulging of the disc and mild uncovertebral prominence. Mild facet degeneration on the left. No compressive canal stenosis. Mild bilateral foraminal narrowing.  C5-6: Spondylosis with endplate osteophytes and bulging of the disc. Foraminal encroachment by osteophytes right more than left. The right C6 nerve could be affected.  C6-7: Spondylosis with endplate osteophytes and bulging of the disc. No compressive central canal stenosis. Mild osteophytic encroachment upon the foramina without likely compressive stenosis.  C7-T1: Facet osteoarthritis on the right which could be painful. No central canal stenosis. Mild right foraminal stenosis. Presumably benign sclerotic focus within the left side of the T1 vertebral body.  CT LUMBAR MYELOGRAM FINDINGS:  L1-2: Advanced disc degeneration of vacuum phenomenon. Endplate osteophytes and bulging of the disc. Facet and ligamentous hypertrophy more on the right. Right lateral recess stenosis that could possibly cause neural compression. Findings at this level could certainly be associated with back pain.  L2-3: Solid fusion posteriorly. No disc pathology. No canal or foraminal stenosis.  L3-4: Solid fusion posteriorly. No disc pathology. Wide patency of the canal and foramina. Nerve root clumping suggesting arachnoiditis.  L4 to sacrum: Chronic solid fusion. Wide patency of the canal  and foramina. Chronic screw fracture on the right at S1 but without evidence of regional motion. Some clumping of the nerve roots suggesting a degree of arachnoiditis.  CT THORACIC MYELOGRAM FINDINGS:  Mild curvature convex to the right. No antero or retrolisthesis. Neuro stimulators in place in the dorsal spinal canal from the T6-7 disc level to the T9-10 disc level. No significant finding at T7-8 or above. No disc bulge or herniation. Ordinary upper thoracic facet osteoarthritis.  T8-9: Disc bulge. No compressive stenosis. Mild facet degeneration.  T9-10: Pronounced disc degeneration with loss of disc height and sclerotic endplate changes. Mild bulging of the disc. Mild narrowing of the lateral recesses. Bilateral foraminal stenosis. Findings at this level could be symptomatic.  T10-11: Disc degeneration with a left posterolateral to foraminal disc herniation. This indents the thecal sac. Left-sided neural compression possible at this level.  T11-12: Mild bulging of the disc.  No canal or foraminal stenosis.  T12-L1: Mild bulging of the disc. Mild facet and ligamentous hypertrophy. No compressive stenosis.  IMPRESSION: Cervical region: Degenerative spondylosis from C3-4 through C6-7. No compressive central canal stenosis. Osteophytic encroachment could potentially cause neural compression on the right at C5-6. Facet arthropathy on the right at C7-T1 with foraminal encroachment that could possibly affect the  right C8 nerve. Foraminal narrowing at other levels appears mild to moderate at most without likely neural compression.  Thoracic region: Neuro stimulators appear well positioned. Advanced degenerative spondylosis at T9-10 with sclerotic bone changes. Mild narrowing of the lateral recesses. Foraminal narrowing that could be symptomatic. T10-11 left posterolateral to foraminal disc herniation which could cause left-sided neural compression.  Lumbar region: Solid fusion  from L2 to the sacrum with wide patency of the canal and foramina. Arachnoiditis pattern is present. Adjacent segment degenerative changes at L1-2 with degenerative spondylosis showing disc narrowing, vacuum phenomenon and chronic endplate changes. Bulging of the disc and facet osteoarthritis. Lateral recess narrowing on the right which could possibly cause neural compression. The findings at this level could be associated with lumbago.   Electronically Signed By: Nelson Chimes M.D. On: 03/31/2018 10:57  Thoracic DG 2-3 views: Results for orders placed during the hospital encounter of 07/25/14  DG Thoracic Spine 2 View  Narrative CLINICAL DATA:  Degenerative disc disease. Chronic lumbar pain for many years. New onset thoracic pain and bilateral flank  EXAM: THORACIC SPINE - 2 VIEW  COMPARISON:  07/25/2014  FINDINGS: Thoracic spine stimulator leads identified. Curvature of the thoracic spine is convex towards the right. There is kyphosis and degenerative disc disease noted at the L1-2 level. There is no evidence of thoracic spine fracture. No other significant bone abnormalities are identified.  IMPRESSION: 1. Thoracic scoliosis and degenerative disc disease.   Electronically Signed By: Kerby Moors M.D. On: 07/25/2014 11:30   Narrative CLINICAL DATA:  Left T10-11 micro discectomy. Lumbar spinal cord stimulator removal.  EXAM: THORACOLUMBAR SPINE 1V  COMPARISON:  Lumbar myelogram 03/31/2018  FINDINGS: Intraoperative portable cross-table lateral views of the lower thoracic and spine are obtained. Postoperative changes with posterior rod and screw fixation from L4 to the sacrum and intervertebral disc prosthesis at L4-5 and L5-S1. Fracture of 1 of the sacral screws. Spinal stimulator with generator pack in the soft tissues over the lumbar region and lead tips extending into the midthoracic spine at least to the T8 level. A localization needle is placed over  the posterior elements at the T11 level. Degenerative changes in the lumbar spine. Normal alignment.  IMPRESSION: Intraoperative spine imaging obtained for localization purposes.   Electronically Signed By: Lucienne Capers M.D. On: 04/25/2018 00:29   Narrative CLINICAL DATA:  Low back pain radiating down both legs for 1 year. Prior lumbar surgery.  EXAM: MRI LUMBAR SPINE WITHOUT CONTRAST  TECHNIQUE: Multiplanar, multisequence MR imaging of the lumbar spine was performed. No intravenous contrast was administered.  COMPARISON:  Lumbar myelogram 03/31/2018  FINDINGS: Segmentation:  Standard.  Alignment: Lumbar spine straightening. Trace anterolisthesis of L1 on L2 and trace retrolisthesis of L5 on S1.  Vertebrae: Solid posterior fusion from L2-S1 as shown on the prior CT with pedicle screws remaining in place bilaterally from L4-S1 where there are also interbody cages with solid interbody fusion. No fracture. Mild nonspecific diffuse bone marrow heterogeneity without a destructive osseous lesion. Minimal degenerative endplate edema at L7-9.  Conus medullaris and cauda equina: Conus extends to the L2 level. Conus and cauda equina appear normal.  Paraspinal and other soft tissues: Postoperative changes in the posterior lumbar soft tissues. 1.2 x 2.9 x 5.0 cm (AP x transverse x craniocaudal) fluid collection in the laminectomy bed at L4-5 without mass effect on the thecal sac, possibly a postoperative seroma or old hematoma.  Disc levels:  T12-L1: Disc desiccation. Mild disc bulging eccentric to the  left, a shallow left central disc protrusion, and mild facet and ligamentum flavum hypertrophy without significant stenosis, unchanged.  L1-2: Disc desiccation and moderate disc space narrowing. Disc bulging and moderate facet and ligamentum flavum hypertrophy result in mild right lateral recess stenosis without spinal or neural foraminal stenosis, unchanged.  L2-3:  Posterior fusion.  No stenosis.  L3-4: Posterior decompression and fusion.  No stenosis.  L4-5: Posterior decompression and fusion.  No stenosis.  L5-S1: Posterior decompression and fusion.  No stenosis.  IMPRESSION: 1. L2-S1 fusion without residual stenosis. 2. Unchanged disc and facet degeneration at L1-2 with mild right lateral recess stenosis.   Electronically Signed By: Logan Bores M.D. On: 12/12/2019 20:13  Narrative CLINICAL DATA:  Severe low back pain radiating to the left lower extremity for 2 weeks. History of lumbar spine surgery. Spinal cord stimulator.  EXAM: CT LUMBAR SPINE WITHOUT CONTRAST  CT PELVIS WITHOUT CONTRAST  TECHNIQUE: Multidetector CT imaging of the lumbar spine and pelvis was performed without intravenous contrast administration. Multiplanar CT image reconstructions were also generated.  COMPARISON:  None.  FINDINGS: CT LUMBAR SPINE FINDINGS  Segmentation: 5 lumbar type vertebrae.  Alignment: Normal.  Vertebrae: Status post L4-S1 PLIF with posterior decompression at L4 and L5. There is a spinal cord stimulator with leads that enters the epidural space at the T12-L1 level. There is no fracture.  Paraspinal and other soft tissues: Calcific aortic atherosclerosis.  Disc levels:  T11-12: Unremarkable  T12-L1: Disc vacuum phenomenon and small bulge. No spinal canal or neural foraminal stenosis.  L1-2: Disc vacuum phenomenon and small disc bulge. No spinal canal stenosis. No neural impingement.  L2-3: Small disc bulge without neural impingement or spinal canal stenosis.  L3-4: Mild disc bulge without neural impingement or spinal canal stenosis.  L4-5: Postfusion changes with posterior decompression and widely patent spinal canal. No neural impingement.  L5-S1: Posterior decompression with widely patent spinal canal. Solid arthrodesis. No neural impingement.  CT PELVIS FINDINGS  Genitourinary: Normal appearance of the  urinary bladder. Status post hysterectomy. No adnexal mass.  Gastrointestinal: The visualized small bowel is normal. Visualized colon is normal.  Vascular/lymphatic: Mild calcific atherosclerosis. No lymphadenopathy.  Musculoskeletal: No pelvic fracture or diastasis.  Other: Spinal cord stimulator generator in the posterior left flank subcutaneous fat.  IMPRESSION: CT LUMBAR SPINE IMPRESSION  1. L4-S1 PLIF and posterior decompression without spinal canal or neural foraminal stenosis. 2. No acute abnormality.  CT PELVIS FINDINGS  1. No acute abnormality.  No pelvic fracture. 2. Mild aortic atherosclerosis (ICD10-I70.0).  These results will be called to the ordering clinician or representative by the Radiology Department at the imaging location.   Electronically Signed By: Ulyses Jarred M.D. On: 03/16/2018 15:02    Narrative CLINICAL DATA:  Previous lumbosacral fusion. Previous neurostimulator. Spinal stenosis. Back and leg pain. Shoulder and arm pain.  FLUOROSCOPY TIME:  0 minutes 48 seconds. 1005.10 micro gray meter squared  PROCEDURE: LUMBAR PUNCTURE FOR CERVICAL LUMBAR AND THORACIC MYELOGRAM  CERVICAL AND LUMBAR AND THORACIC MYELOGRAM  CT CERVICAL MYELOGRAM  CT LUMBAR MYELOGRAM  CT THORACIC MYELOGRAM  Lumbar puncture and contrast injection was performed by Dr. Ellene Route.  I personally performed the  acquisition of the myelogram images.  TECHNIQUE: Contiguous axial images were obtained through the Cervical, Thoracic, and Lumbar spine after the intrathecal infusion of infusion. Coronal and sagittal reconstructions were obtained of the axial image sets.  FINDINGS: CERVICAL AND LUMBAR MYELOGRAM FINDINGS:  Lumbar puncture was done at the L2-3 level. There is  been previous discectomy and fusion from L4 to the sacrum with pedicle screws and posterior rods. Screw fractures noted on the right at S1. There is no central canal stenosis. Disc degeneration at  L1-2 with vacuum phenomenon but no apparent compressive stenosis. Chronic disc space narrowing L2-3 and L3-4. 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  In the cervical region, there are anterior extradural defects from C3-4 through C6-7. Slightly diminished root sleeve filling on both sides at those levels. No critical stenosis seen.  CT CERVICAL MYELOGRAM FINDINGS:  Foramen magnum is widely patent.  C1-2 and C2-3 are normal.  C3-4: Mild bulging of the disc and mild uncovertebral prominence. No canal or foraminal stenosis.  C4-5: Mild bulging of the disc and mild uncovertebral prominence. Mild facet degeneration on the left. No compressive canal stenosis. Mild bilateral foraminal narrowing.  C5-6: Spondylosis with endplate osteophytes and bulging of the disc. Foraminal encroachment by osteophytes right more than left. The right C6 nerve could be affected.  C6-7: Spondylosis with endplate osteophytes and bulging of the disc. No compressive central canal stenosis. Mild osteophytic encroachment upon the foramina without likely compressive stenosis.  C7-T1: Facet osteoarthritis on the right which could be painful. No central canal stenosis. Mild right foraminal stenosis. Presumably benign sclerotic focus within the left side of the T1 vertebral body.  CT LUMBAR MYELOGRAM FINDINGS:  L1-2: Advanced disc degeneration of vacuum phenomenon. Endplate osteophytes and bulging of the disc. Facet and ligamentous hypertrophy more on the right. Right lateral recess stenosis that could possibly cause neural compression. Findings at this level could certainly be associated with back pain.  L2-3: Solid fusion posteriorly. No disc pathology. No canal or foraminal stenosis.  L3-4: Solid fusion posteriorly. No disc pathology. Wide patency of the canal and foramina. Nerve root clumping suggesting arachnoiditis.  L4 to sacrum: Chronic solid fusion. Wide patency of the canal  and foramina. Chronic screw fracture on the right at S1 but without evidence of regional motion. Some clumping of the nerve roots suggesting a degree of arachnoiditis.  CT THORACIC MYELOGRAM FINDINGS:  Mild curvature convex to the right. No antero or retrolisthesis. Neuro stimulators in place in the dorsal spinal canal from the T6-7 disc level to the T9-10 disc level. No significant finding at T7-8 or above. No disc bulge or herniation. Ordinary upper thoracic facet osteoarthritis.  T8-9: Disc bulge. No compressive stenosis. Mild facet degeneration.  T9-10: Pronounced disc degeneration with loss of disc height and sclerotic endplate changes. Mild bulging of the disc. Mild narrowing of the lateral recesses. Bilateral foraminal stenosis. Findings at this level could be symptomatic.  T10-11: Disc degeneration with a left posterolateral to foraminal disc herniation. This indents the thecal sac. Left-sided neural compression possible at this level.  T11-12: Mild bulging of the disc.  No canal or foraminal stenosis.  T12-L1: Mild bulging of the disc. Mild facet and ligamentous hypertrophy. No compressive stenosis.  IMPRESSION: Cervical region: Degenerative spondylosis from C3-4 through C6-7. No compressive central canal stenosis. Osteophytic encroachment could potentially cause neural compression on the right at C5-6. Facet arthropathy on the right at C7-T1 with foraminal encroachment that could possibly affect the right C8 nerve. Foraminal narrowing at other levels appears mild to moderate at most without likely neural compression.  Thoracic region: Neuro stimulators appear well positioned. Advanced degenerative spondylosis at T9-10 with sclerotic bone changes. Mild narrowing of the lateral recesses. Foraminal narrowing that could be symptomatic. T10-11 left posterolateral to foraminal disc herniation which could cause left-sided  neural compression.  Lumbar region: Solid fusion  from L2 to the sacrum with wide patency of the canal and foramina. Arachnoiditis pattern is present. Adjacent segment degenerative changes at L1-2 with degenerative spondylosis showing disc narrowing, vacuum phenomenon and chronic endplate changes. Bulging of the disc and facet osteoarthritis. Lateral recess narrowing on the right which could possibly cause neural compression. The findings at this level could be associated with lumbago.   Electronically Signed By: Nelson Chimes M.D. On: 03/31/2018 10:57   Narrative CLINICAL DATA:  80 year old with back pain. Sciatica in the left leg.  EXAM: LUMBAR SPINE - 2-3 VIEW  COMPARISON:  CT 03/21/2017 and lumbar radiograph 08/30/2015  FINDINGS: Again noted is posterior lumbar interbody fusion at L4, L5 and S1 with bilateral pedicle screws, rods and interbody devices. There is a chronic fracture involving the right S1 pedicle screw. Patient has a neural stimulator device extending into the thoracic spine. Alignment of the lumbar spine is unchanged. There is chronic disc space narrowing at L3-L4, L2-L3 and L1-L2. Marked sclerosis and endplate changes along the anterior aspect of L1-L2. Vertebral body heights are maintained.  IMPRESSION: No acute abnormality in the lumbar spine.  Stable appearance of the posterior lumbar interbody fusion at L4 through S1. Chronic fracture involving the right S1 pedicle screw.  Stable disc space narrowing and endplate disease in the lumbar spine as described.   Electronically Signed By: Markus Daft M.D. On: 03/15/2018 08:22  Lumbar DG (Complete) 4+V: Results for orders placed during the hospital encounter of 08/30/15  DG Lumbar Spine Complete  Narrative CLINICAL DATA:  Chronic back pain.  Prior surgery.  EXAM: LUMBAR SPINE - COMPLETE 4+ VIEW  COMPARISON:  07/25/2014 .  FINDINGS: L4-L5-L5-S1 posterior and interbody fusion. Fracture of the right S1 pedicle screw appears be present. No  change noted. Lumbar vertebra numbered as per prior lumbar spine series. Diffuse osteopenia degenerative change. Neurostimulator noted.  IMPRESSION: L4-L5-L5-S1 posterior interbody fusion. Fracture of the right S1 pedicle screw appears be present. This appears to be stable. Diffuse osteopenia degenerative change. Neurostimulator present. No acute abnormality.   Electronically Signed By: Kemmerer On: 08/30/2015 14:01   Narrative Clinical Data: Back pain. Spondylosis. LUMBAR MYELOGRAM: Technique: Lumbar puncture and injection of Omnipaque contrast was performed by Dr. Ellene Route at L3-4. Findings: There has been bilateral pedicle screw fusion and interbody fusion of L4-5 and L5-S1. Hardware appears to be well positioned. No significant spinal stenosis is identified. There is some disc degeneration at L3-4 without significant spinal stenosis. Standing flexion and extension views reveal no abnormal movement.  Impression Satisfactory pedicle screw and interbody fusion at L4-5 and L5-S1 without spinal stenosis. LUMBAR SPINE CT WITH CONTRAST: Technique: Multidetector CT imaging of the lumbar spine was performed following intravenous contrast administration. Multiplanar CT image reconstructions were also generated. Comparison: CT myelogram of 01/03/06. Findings: The prior film showed pedicle screw fusion of L2, L3 and L4. The hardware at L2 and L3 have been removed, and there is now bilateral pedicle screw and interbody fusion of L4, L5 and S1. Lumbar alignment is normal. Conus medullaris is normal. L1-2: Small lateral disc protrusion on the left is similar to the prior study. This could be affecting the left L1 nerve root. L2-3: Mild disc space narrowing and facet arthropathy are present without significant spinal stenosis. L3-4: Moderate disc degeneration with vertebral spurring. There is posterior bony fusion without spinal stenosis. L4-5: Pedicle screw and interbody fusion without  spinal stenosis. Posterior fluid collection is present  measuring 23 x 33 mm without significant mass effect on thecal sac. The fluid collection does not fill with contrast and could be due to a chronic postoperative seroma or possibly a CSF collection. L5-S1: Pedicle screw and interbody fusion without spinal stenosis. Hardware appears to be well positioned. IMPRESSION: 1. Interval removal of pedicle screws at L2 and L3. There is now pedicle screw fusion of L4, L5 and S1. 2. No significant spinal stenosis is identified with satisfactory hardware positioning.  Provider: Elvera Lennox Hip-L DG 2-3 views: Results for orders placed during the hospital encounter of 03/15/18  DG Hip Unilat W or Wo Pelvis 2-3 Views Left  Narrative CLINICAL DATA:  Pain with radicular symptoms  EXAM: DG HIP (WITH OR WITHOUT PELVIS) 2-3V LEFT  COMPARISON:  None.  FINDINGS: Frontal pelvis as well as frontal and lateral left hip joint images were obtained. There is no fracture or dislocation. Joint spaces appear normal. There is postoperative change in the lower lumbar and upper sacral regions. There is a stimulator device overlying the superior left iliac crest.  IMPRESSION: No fracture or dislocation. Joint spaces appear unremarkable. Postoperative change in lower lumbar/upper sacral region.   Electronically Signed By: Lowella Grip III M.D. On: 03/15/2018 08:16    Complexity Note: Imaging results reviewed. Results shared with [name], using Layman's terms.                         ROS  Cardiovascular: Abnormal heart rhythm, High blood pressure, and Heart catheterization Pulmonary or Respiratory: No reported pulmonary signs or symptoms such as wheezing and difficulty taking a deep full breath (Asthma), difficulty blowing air out (Emphysema), coughing up mucus (Bronchitis), persistent dry cough, or temporary stoppage of breathing during sleep Neurological: No reported neurological signs or symptoms  such as seizures, abnormal skin sensations, urinary and/or fecal incontinence, being born with an abnormal open spine and/or a tethered spinal cord Psychological-Psychiatric: Anxiousness, Depressed, and Prone to panicking Gastrointestinal: Reflux or heatburn Genitourinary: Kidney disease Hematological: No reported hematological signs or symptoms such as prolonged bleeding, low or poor functioning platelets, bruising or bleeding easily, hereditary bleeding problems, low energy levels due to low hemoglobin or being anemic Endocrine: High thyroid Rheumatologic: Generalized muscle aches (Fibromyalgia) Musculoskeletal: Negative for myasthenia gravis, muscular dystrophy, multiple sclerosis or malignant hyperthermia Work History: Retired  Allergies  Ms. Blouch has No Known Allergies.  Laboratory Chemistry Profile   Renal Lab Results  Component Value Date   BUN 11 11/08/2020   CREATININE 1.24 (H) 11/08/2020   GFRAA 60 (L) 11/11/2018   GFRNONAA 45 (L) 11/08/2020   PROTEINUR NEGATIVE 10/18/2020     Electrolytes Lab Results  Component Value Date   NA 137 11/08/2020   K 4.2 11/08/2020   CL 106 11/08/2020   CALCIUM 9.3 11/08/2020   MG 1.9 09/12/2012     Hepatic Lab Results  Component Value Date   AST 31 11/08/2020   ALT 17 11/08/2020   ALBUMIN 4.1 11/08/2020   ALKPHOS 87 11/08/2020   LIPASE 26 11/08/2020     ID Lab Results  Component Value Date   SARSCOV2NAA NEGATIVE 11/16/2020   STAPHAUREUS NEGATIVE 04/16/2018   MRSAPCR NEGATIVE 04/16/2018     Bone No results found for: VD25OH, [id number], AF7903YB3, XO3291BT6, 25OHVITD1, 25OHVITD2, 25OHVITD3, TESTOFREE, TESTOSTERONE   Endocrine Lab Results  Component Value Date   GLUCOSE 199 (H) 11/08/2020   GLUCOSEU NEGATIVE 10/18/2020   TSH 3.872 10/18/2020   FREET4 1.17 (H)  11/11/2018     Neuropathy No results found for: VITAMINB12, FOLATE, HGBA1C, HIV   CNS No results found for: COLORCSF, APPEARCSF, RBCCOUNTCSF, WBCCSF,  POLYSCSF, LYMPHSCSF, EOSCSF, PROTEINCSF, GLUCCSF, JCVIRUS, CSFOLI, IGGCSF, LABACHR, ACETBL, LABACHR, ACETBL   Inflammation (CRP: Acute  ESR: Chronic) Lab Results  Component Value Date   LATICACIDVEN 2.7 ([place]) 11/08/2020     Rheumatology No results found for: RF, ANA, LABURIC, URICUR, LYMEIGGIGMAB, LYMEABIGMQN, HLAB27   Coagulation Lab Results  Component Value Date   INR 0.96 07/07/2013   LABPROT 12.6 07/07/2013   APTT 34 07/07/2013   PLT 234 11/08/2020   DDIMER (H) 05/21/2010    0.63        AT THE INHOUSE ESTABLISHED CUTOFF VALUE OF 0.48 ug/mL FEU, THIS ASSAY HAS BEEN DOCUMENTED IN THE LITERATURE TO HAVE A SENSITIVITY AND NEGATIVE PREDICTIVE VALUE OF AT LEAST 98 TO 99%.  THE TEST RESULT SHOULD BE CORRELATED WITH AN ASSESSMENT OF THE CLINICAL PROBABILITY OF DVT / VTE.     Cardiovascular Lab Results  Component Value Date   BNP 279 (H) 09/03/2012   CKTOTAL 168 09/03/2012   CKMB 0.8 09/03/2012   TROPONINI < 0.02 09/03/2012   HGB 15.0 11/08/2020   HCT 42.9 11/08/2020     Screening Lab Results  Component Value Date   SARSCOV2NAA NEGATIVE 11/16/2020   STAPHAUREUS NEGATIVE 04/16/2018   MRSAPCR NEGATIVE 04/16/2018     Cancer No results found for: CEA, CA125, LABCA2   Allergens No results found for: ALMOND, APPLE, ASPARAGUS, AVOCADO, BANANA, BARLEY, BASIL, BAYLEAF, GREENBEAN, LIMABEAN, WHITEBEAN, BEEFIGE, REDBEET, BLUEBERRY, BROCCOLI, CABBAGE, MELON, CARROT, CASEIN, CASHEWNUT, CAULIFLOWER, CELERY     Note: Lab results reviewed.  [place]  Drug: Ms. Hamor  reports no history of drug use. Alcohol:  reports no history of alcohol use. Tobacco:  reports that she quit smoking about 34 years ago. Her smoking use included cigarettes. She smoked an average of 0.50 packs per day. She has never used smokeless tobacco. Medical:  has a past medical history of Arthritis, Asthma, Chronic back pain, Chronic back pain, Depression, Diastolic dysfunction, Diverticulosis, Dysrhythmia,  Fibromyalgia, Gastritis, GERD (gastroesophageal reflux disease), Headache(784.0), Hyperlipidemia, Hypertension, Hypothyroidism, Ischemic colitis ([place]), Joint pain, Nocturia, Obesity, and Vitamin D deficiency. Family: family history is not on file.  Past Surgical History:  Procedure Laterality Date   ABDOMINAL HYSTERECTOMY     APPENDECTOMY     BACK SURGERY     x 3   CARDIAC CATHETERIZATION  04/13/2010   normal cororanies, EF 55% ([facility]; Dr. Clayborn Bigness)   CHOLECYSTECTOMY     COLONOSCOPY     COLONOSCOPY WITH PROPOFOL N/A 01/21/2017   Procedure: COLONOSCOPY WITH PROPOFOL;  Surgeon: Lollie Sails, MD;  Location: [facility] ENDOSCOPY;  Service: Endoscopy;  Laterality: N/A;   CORONARY ANGIOPLASTY     ESOPHAGOGASTRODUODENOSCOPY  06/12/2011   ESOPHAGOGASTRODUODENOSCOPY (EGD) WITH PROPOFOL N/A 01/21/2017   Procedure: ESOPHAGOGASTRODUODENOSCOPY (EGD) WITH PROPOFOL;  Surgeon: Lollie Sails, MD;  Location: [facility] ENDOSCOPY;  Service: Endoscopy;  Laterality: N/A;   FOOT SURGERY Left    d/t neuroma   OOPHORECTOMY     radiation to thyroid     RIGHT/LEFT HEART CATH AND CORONARY ANGIOGRAPHY Bilateral 10/22/2016   Procedure: RIGHT/LEFT HEART CATH AND CORONARY ANGIOGRAPHY;  Surgeon: Yolonda Kida, MD;  Location: [place] [facility];  Service: Cardiovascular;  Laterality: Bilateral;   SPINAL CORD STIMULATOR INSERTION N/A 07/16/2013   Procedure: LUMBAR SPINAL CORD STIMULATOR INSERTION;  Surgeon: Bonna Gains, MD;  Location: [facility];  Service: Neurosurgery;  Laterality: N/A;   THORACIC DISCECTOMY Left 04/24/2018   Procedure: Left Thoracic ten- thoracic eleven Microdiscectomy, Removal of Spinal Cord Stimulator;  Surgeon: Kristeen Miss, MD;  Location: [place];  Service: Neurosurgery;  Laterality: Left;  Left Thoracic ten- thoracic eleven Microdiscectomy, Removal of Spinal Cord Stimulator   Active Ambulatory Problems    Diagnosis Date Noted   Diarrhea 05/02/2011   Generalized weakness 05/02/2011   Dehydration  05/02/2011   HTN (hypertension) 05/02/2011   Hypothyroidism 05/02/2011   GERD (gastroesophageal reflux disease) 05/02/2011   Pain in joint, shoulder region 11/22/2013   Shoulder weakness 11/22/2013   HNP (herniated nucleus pulposus), thoracic 04/24/2018   Resolved Ambulatory Problems    Diagnosis Date Noted   No Resolved Ambulatory Problems   Past Medical History:  Diagnosis Date   Arthritis    Asthma    Chronic back pain    Chronic back pain    Depression    Diastolic dysfunction    Diverticulosis    Dysrhythmia    Fibromyalgia    Gastritis    Headache(784.0)    Hyperlipidemia    Hypertension    Ischemic colitis ([place])    Joint pain    Nocturia    Obesity    Vitamin D deficiency    Constitutional Exam  General appearance: Well nourished, well developed, and well hydrated. In no apparent acute distress Vitals:   11/30/20 1301  BP: 121/69  Pulse: (!) 56  Resp: 16  Temp: (!) 97.1 F (36.2 C)  TempSrc: Temporal  SpO2: 93%  Weight: 226 lb (102.5 kg)  Height: 5' 4"  (1.626 m)   BMI Assessment: Estimated body mass index is 38.79 kg/m as calculated from the following:   Height as of this encounter: 5' 4"  (1.626 m).   Weight as of this encounter: 226 lb (102.5 kg).  BMI interpretation table: BMI level Category Range association with higher incidence of chronic pain  <18 kg/m2 Underweight   18.5-24.9 kg/m2 Ideal body weight   25-29.9 kg/m2 Overweight Increased incidence by 20%  30-34.9 kg/m2 Obese (Class I) Increased incidence by 68%  35-39.9 kg/m2 Severe obesity (Class II) Increased incidence by 136%  >40 kg/m2 Extreme obesity (Class III) Increased incidence by 254%   Patient's current BMI Ideal Body weight  Body mass index is 38.79 kg/m. Ideal body weight: 54.7 kg (120 lb 9.5 oz) Adjusted ideal body weight: 73.8 kg (162 lb 12.1 oz)   BMI Readings from Last 4 Encounters:  11/30/20 38.79 kg/m  11/16/20 34.50 kg/m  11/08/20 29.13 kg/m  10/18/20 29.13 kg/m    Wt Readings from Last 4 Encounters:  11/30/20 226 lb (102.5 kg)  11/16/20 201 lb (91.2 kg)  11/08/20 175 lb 0.7 oz (79.4 kg)  10/18/20 175 lb 0.7 oz (79.4 kg)    Psych/Mental status: Alert, oriented x 3 (person, place, & time)       Eyes: PERLA Respiratory: No evidence of acute respiratory distress  Thoracic Spine Area Exam  Skin & Axial Inspection: Well healed scar from previous spine surgery detected Alignment: Symmetrical Functional ROM: Pain restricted ROM Stability: No instability detected Muscle Tone/Strength: Functionally intact. No obvious neuro-muscular anomalies detected. Sensory (Neurological): Dermatomal pain pattern Muscle strength & Tone: No palpable anomalies Lumbar Spine Area Exam  Skin & Axial Inspection: Well healed scar from previous spine surgery detected Alignment: Symmetrical Functional ROM: Mechanically restricted ROM       Stability: No instability detected Muscle Tone/Strength: Functionally intact. No obvious neuro-muscular anomalies  detected. Sensory (Neurological): Dermatomal pain pattern  Gait & Posture Assessment  Ambulation: Limited Gait: Antalgic gait (limping) Posture: Difficulty standing up straight, due to pain  Lower Extremity Exam    Side: Right lower extremity  Side: Left lower extremity  Stability: No instability observed          Stability: No instability observed          Skin & Extremity Inspection: Skin color, temperature, and hair growth are WNL. No peripheral edema or cyanosis. No masses, redness, swelling, asymmetry, or associated skin lesions. No contractures.  Skin & Extremity Inspection: Skin color, temperature, and hair growth are WNL. No peripheral edema or cyanosis. No masses, redness, swelling, asymmetry, or associated skin lesions. No contractures.  Functional ROM: Pain restricted ROM                  Functional ROM: Pain restricted ROM                  Muscle Tone/Strength: Functionally intact. No obvious neuro-muscular  anomalies detected.  Muscle Tone/Strength: Functionally intact. No obvious neuro-muscular anomalies detected.  Sensory (Neurological): Dermatomal pain pattern        Sensory (Neurological): Dermatomal pain pattern        DTR: Patellar: 0: absent Achilles: deferred today Plantar: deferred today  DTR: Patellar: 0: absent Achilles: deferred today Plantar: deferred today  Palpation: No palpable anomalies  Palpation: No palpable anomalies    Assessment  Primary Diagnosis & Pertinent Problem List: The primary encounter diagnosis was History of lumbar fusion. Diagnoses of Failed back surgical syndrome, Chronic radicular lumbar pain, Spinal stenosis, lumbar region, with neurogenic claudication, and Chronic pain syndrome were also pertinent to this visit.  Visit Diagnosis (New problems to examiner): 1. History of lumbar fusion   2. Failed back surgical syndrome   3. Chronic radicular lumbar pain   4. Spinal stenosis, lumbar region, with neurogenic claudication   5. Chronic pain syndrome    Plan of Care   General Recommendations: The pain condition that the patient suffers from is best treated with a multidisciplinary approach that involves an increase in physical activity to prevent de-conditioning and worsening of the pain cycle, as well as psychological counseling (formal and/or informal) to address the co-morbid psychological affects of pain. Treatment will often involve judicious use of pain medications and interventional procedures to decrease the pain, allowing the patient to participate in the physical activity that will ultimately produce long-lasting pain reductions. The goal of the multidisciplinary approach is to return the patient to a higher level of overall function and to restore their ability to perform activities of daily living.  Lab Orders         Compliance Drug Analysis, Ur      Pharmacotherapy (current): Medications ordered:  Meds ordered this encounter  Medications    buprenorphine (BUTRANS) 5 MCG/HR PTWK    Sig: Place 1 patch onto the skin once a week for 28 days.    Dispense:  4 patch    Refill:  0    Chronic Pain: STOP Act (Not applicable) Fill 1 day early if closed on refill date. Avoid benzodiazepines within 8 hours of opioids   Medications administered during this visit: Ammarie C. Mare Ferrari had no medications administered during this visit.   Pharmacological management options:  Opioid Analgesics: The patient was informed that there is no guarantee that she would be a candidate for opioid analgesics. The decision will be made following CDC guidelines. This decision  will be based on the results of diagnostic studies, as well as Ms. Feutz's risk profile.   Membrane stabilizer: To be determined at a later time  Muscle relaxant: To be determined at a later time  NSAID: To be determined at a later time  Other analgesic(s): To be determined at a later time   Interventional management options: Ms. Daddona was informed that there is no guarantee that she would be a candidate for interventional therapies. The decision will be based on the results of diagnostic studies, as well as Ms. Rehm's risk profile.  Procedure(s) under consideration:  N/A    Provider-requested follow-up: Return in about 4 weeks (around 12/28/2020) for Medication Management, in person.  I spent a total of 60 minutes reviewing chart data, face-to-face evaluation with the patient, counseling and coordination of care as detailed above.   Future Appointments  Date Time Provider [place]  12/20/2020  1:00 PM Ursula Alert, MD [facility] None  01/08/2021  1:40 PM [facility]  03/01/2021  8:00 AM Madison Santa, MD [facility] None    Note by: Madison Santa, MD Date: 11/30/2020; Time: 3:01 PM

## 2020-12-01 ENCOUNTER — Telehealth: Payer: Self-pay | Admitting: Student in an Organized Health Care Education/Training Program

## 2020-12-01 NOTE — Telephone Encounter (Signed)
Insurance denied payment for Buprenorphine.

## 2020-12-01 NOTE — Telephone Encounter (Signed)
Patient states her insurance will not pay for meds that dr Holley Raring sent in yesterday. Maybe they need PA? , she cant afford to by them they are over $100. Please check and let patient know. She says maybe he could prescribe something else. I let patient know dr Holley Raring isnt in office today and it may be Monday afternoon when she finds out something.

## 2020-12-04 ENCOUNTER — Telehealth: Payer: Self-pay | Admitting: Student in an Organized Health Care Education/Training Program

## 2020-12-04 MED ORDER — TRAMADOL HCL ER 100 MG PO TB24: 100.0000 mg | ORAL_TABLET | Freq: Every day | ORAL | 0 refills | Status: DC

## 2020-12-04 NOTE — Telephone Encounter (Signed)
Patient notified of Tramadol. Instructions given.

## 2020-12-04 NOTE — Telephone Encounter (Signed)
Insurance denied payment for Buprenorphine. Will you prescribe something else?

## 2020-12-06 ENCOUNTER — Telehealth: Payer: Self-pay

## 2020-12-06 LAB — COMPLIANCE DRUG ANALYSIS, UR

## 2020-12-06 NOTE — Telephone Encounter (Signed)
Called pharmacy and it needs a PA. Called patient and informed that medication needs PA. Pharmacy sent to wrong fax#. They will resend and we will follow up accordingly. Patient called and updated.

## 2020-12-06 NOTE — Telephone Encounter (Signed)
Pt states the pharmacist will not fill the prescription until the MD states why pt needs it please reach out and call this pt

## 2020-12-12 ENCOUNTER — Telehealth: Payer: Self-pay

## 2020-12-12 NOTE — Telephone Encounter (Signed)
Pt is in A LOT of pain and says her pain meds are not working. Would like a call to advise any next steps

## 2020-12-13 NOTE — Telephone Encounter (Signed)
I spoke with this patient on yesterday about her increased pain.  She states that her back is hurting so much that it is making her sick.  She has tramadol that she can take daily.  I asked if she was taking any other analgesics such as tylenol or ibuprofen.  She states she is afraid to take anything else along with the tramadol.  Upon further review she has tylenol listed in her medication list.  I told her that she could take that as directed and as tolerated to help with her pain.  Also, encouraged comfort measures such as ice/heat and positioning with pillows etc.  Patient verbalizes u/o information.

## 2020-12-20 ENCOUNTER — Ambulatory Visit: Payer: Self-pay | Admitting: Psychiatry

## 2020-12-28 ENCOUNTER — Other Ambulatory Visit: Payer: Self-pay

## 2020-12-28 ENCOUNTER — Encounter: Payer: Self-pay | Admitting: Student in an Organized Health Care Education/Training Program

## 2020-12-28 ENCOUNTER — Ambulatory Visit
Payer: Medicare HMO | Attending: Student in an Organized Health Care Education/Training Program | Admitting: Student in an Organized Health Care Education/Training Program

## 2020-12-28 VITALS — BP 134/89 | HR 61 | Temp 97.0°F | Resp 16 | Ht 64.0 in | Wt 226.0 lb

## 2020-12-28 DIAGNOSIS — M5416 Radiculopathy, lumbar region: Secondary | ICD-10-CM | POA: Diagnosis not present

## 2020-12-28 DIAGNOSIS — M961 Postlaminectomy syndrome, not elsewhere classified: Secondary | ICD-10-CM

## 2020-12-28 DIAGNOSIS — Z981 Arthrodesis status: Secondary | ICD-10-CM

## 2020-12-28 DIAGNOSIS — M48062 Spinal stenosis, lumbar region with neurogenic claudication: Secondary | ICD-10-CM | POA: Diagnosis not present

## 2020-12-28 DIAGNOSIS — G8929 Other chronic pain: Secondary | ICD-10-CM | POA: Diagnosis present

## 2020-12-28 DIAGNOSIS — G894 Chronic pain syndrome: Secondary | ICD-10-CM

## 2020-12-28 MED ORDER — HYDROCODONE-ACETAMINOPHEN 5-325 MG PO TABS: 1.0000 | ORAL_TABLET | Freq: Three times a day (TID) | ORAL | 0 refills | Status: DC | PRN

## 2020-12-28 MED ORDER — HYDROCODONE-ACETAMINOPHEN 5-325 MG PO TABS: 1.0000 | ORAL_TABLET | Freq: Three times a day (TID) | ORAL | 0 refills | Status: AC | PRN

## 2020-12-28 NOTE — Progress Notes (Signed)
Nursing Pain Medication Assessment:  Safety precautions to be maintained throughout the outpatient stay will include: orient to surroundings, keep bed in low position, maintain call bell within reach at all times, provide assistance with transfer out of bed and ambulation.  Medication Inspection Compliance: Pill count conducted under aseptic conditions, in front of the patient. Neither the pills nor the bottle was removed from the patient's sight at any time. Once count was completed pills were immediately returned to the patient in their original bottle.  Medication: Tramadol (Ultram) Pill/Patch Count:  11 of 30 pills remain Pill/Patch Appearance: Markings consistent with prescribed medication Bottle Appearance: Standard pharmacy container. Clearly labeled. Filled Date: 64 / 12 / 2022 Last Medication intake:  Today

## 2020-12-28 NOTE — Progress Notes (Signed)
PROVIDER NOTE: Information contained herein reflects review and annotations entered in association with encounter. Interpretation of such information and data should be left to medically-trained personnel. Information provided to patient can be located elsewhere in the medical record under "Patient Instructions". Document created using STT-dictation technology, any transcriptional errors that may result from process are unintentional.    Patient: Madison Jennings  Service Category: E/M  Provider: [name], MD  DOB: Aug 04, 1940  DOS: 12/28/2020  Specialty: Interventional Pain Management  MRN: [id number]  Setting: Ambulatory outpatient  PCP: [name], MD  Type: Established Patient    Referring Provider: [name], MD  Location: Office  Delivery: Face-to-face     HPI  Madison Jennings, a 80 y.o. year old female, is here today because of her History of lumbar fusion [Z98.1]. Madison Jennings primary complain today is Back Pain (Thoracic and lumbar bilateral ) Last encounter: My last encounter with her was on 12/04/2020.  Pain Assessment: Severity of Chronic pain is reported as a 8 /10. Location: Back Mid, Lower, Left, Right/across the top of hips and occassionally down the left leg. Onset: More than a month ago. Quality: Discomfort, Constant, Aching, Throbbing. Timing: Constant. Modifying factor(s): is taking pain medications and tylenol and it is not helping at all.. Vitals:  height is 5' 4"  (1.626 m) and weight is 226 lb (102.5 kg). Her temporal temperature is 97 F (36.1 C) (abnormal). Her blood pressure is 134/89 and her pulse is 61. Her respiration is 16 and oxygen saturation is 100%.   Reason for encounter: medication management.    Patient presents today for medication management.  She is accompanied today by her daughter .  At her last clinic visit which was her initial clinic visit, she was prescribed Butrans transdermal patch for chronic pain management however this was too  expensive for her.  She was prescribed tramadol 100 mg ER which unfortunately she is not finding any benefit from.  We discussed transitioning to hydrocodone to see if that provides better analgesic benefit.   Pharmacotherapy Assessment  Analgesic: Discontinue tramadol, start hydrocodone 5 mg 3 times daily as needed as below.  Monitoring: [place] PMP: PDMP reviewed during this encounter.       Pharmacotherapy: No side-effects or adverse reactions reported. Compliance: No problems identified. Effectiveness: Clinically acceptable.  Janett Billow, RN  12/28/2020  1:11 PM  Sign when Signing Visit Nursing Pain Medication Assessment:  Safety precautions to be maintained throughout the outpatient stay will include: orient to surroundings, keep bed in low position, maintain call bell within reach at all times, provide assistance with transfer out of bed and ambulation.  Medication Inspection Compliance: Pill count conducted under aseptic conditions, in front of the patient. Neither the pills nor the bottle was removed from the patient's sight at any time. Once count was completed pills were immediately returned to the patient in their original bottle.  Medication: Tramadol (Ultram) Pill/Patch Count:  11 of 30 pills remain Pill/Patch Appearance: Markings consistent with prescribed medication Bottle Appearance: Standard pharmacy container. Clearly labeled. Filled Date: 86 / 12 / 2022 Last Medication intake:  Today   UDS:  Summary  Date Value Ref Range Status  11/30/2020 Note  Final    Comment:    ==================================================================== Compliance Drug Analysis, Ur ==================================================================== Test                             Result  Flag       Units  Drug Present and Declared for Prescription Verification   Desmethyldiazepam              349          EXPECTED   ng/mg creat   Oxazepam                       1247          EXPECTED   ng/mg creat   Temazepam                      996          EXPECTED   ng/mg creat    Desmethyldiazepam, oxazepam, and temazepam are expected metabolites    of diazepam. Desmethyldiazepam and oxazepam are also expected    metabolites of other drugs, including chlordiazepoxide, prazepam,    clorazepate, and halazepam. Oxazepam is an expected metabolite of    temazepam. Oxazepam and temazepam are also available as scheduled    prescription medications.    Bupropion                      PRESENT      EXPECTED   Hydroxybupropion               PRESENT      EXPECTED    Hydroxybupropion is an expected metabolite of bupropion.    Acetaminophen                  PRESENT      EXPECTED   Hydroxyzine                    PRESENT      EXPECTED  Drug Present not Declared for Prescription Verification   Norhydrocodone                 369          UNEXPECTED ng/mg creat    Norhydrocodone is an expected metabolite of hydrocodone.    Gabapentin                     PRESENT      UNEXPECTED  Drug Absent but Declared for Prescription Verification   Buprenorphine                  Not Detected UNEXPECTED ng/mg creat    Transdermal buprenorphine, as indicated in the declared medication    list, is not always detected even when used as directed.    Ibuprofen                      Not Detected UNEXPECTED    Ibuprofen, as indicated in the declared medication list, is not    always detected even when used as directed.  ==================================================================== Test                      Result    Flag   Units      Ref Range   Creatinine              55               mg/dL      >=20 ==================================================================== Declared Medications:  The flagging and interpretation on this report are based on the  following declared medications.  Unexpected results may arise from  inaccuracies in the declared medications.   **Note: The testing scope of  this panel includes these medications:   Bupropion  Diazepam (Valium)  Hydroxyzine (Atarax)   **Note: The testing scope of this panel does not include small to  moderate amounts of these reported medications:   Acetaminophen (Tylenol)  Buprenorphine Patch (BuTrans)  Ibuprofen (Advil)   **Note: The testing scope of this panel does not include the  following reported medications:   Amlodipine (Norvasc)  Esomeprazole (Nexium)  Levothyroxine (Synthroid)  Lisinopril (Zestril) ==================================================================== For clinical consultation, please call [phone]. ====================================================================      ROS  Constitutional: Denies any fever or chills Gastrointestinal: No reported hemesis, hematochezia, vomiting, or acute GI distress Musculoskeletal: Denies any acute onset joint swelling, redness, loss of ROM, or weakness Neurological: No reported episodes of acute onset apraxia, aphasia, dysarthria, agnosia, amnesia, paralysis, loss of coordination, or loss of consciousness  Medication Review  HYDROcodone-acetaminophen, acetaminophen, amLODipine, azelastine, clonazePAM, esomeprazole, hydrOXYzine, levothyroxine, lisinopril, sertraline, and traMADol  History Review  Allergy: Madison Jennings has No Known Allergies. Drug: Madison Jennings  reports no history of drug use. Alcohol:  reports no history of alcohol use. Tobacco:  reports that she quit smoking about 34 years ago. Her smoking use included cigarettes. She smoked an average of 0.50 packs per day. She has never used smokeless tobacco. Social: Ms. Bookbinder  reports that she quit smoking about 34 years ago. Her smoking use included cigarettes. She smoked an average of 0.50 packs per day. She has never used smokeless tobacco. She reports that she does not drink alcohol and does not use drugs. Medical:  has a past medical history of Arthritis, Asthma, Chronic back pain, Chronic  back pain, Depression, Diastolic dysfunction, Diverticulosis, Dysrhythmia, Fibromyalgia, Gastritis, GERD (gastroesophageal reflux disease), Headache(784.0), Hyperlipidemia, Hypertension, Hypothyroidism, Ischemic colitis ([place]), Joint pain, Nocturia, Obesity, and Vitamin D deficiency. Surgical: Madison Jennings  has a past surgical history that includes Abdominal hysterectomy; Cholecystectomy; Foot surgery (Left); radiation to thyroid; Colonoscopy; Cardiac catheterization (04/13/2010); Spinal cord stimulator insertion (N/A, 07/16/2013); RIGHT/LEFT HEART CATH AND CORONARY ANGIOGRAPHY (Bilateral, 10/22/2016); Appendectomy; Coronary angioplasty; Esophagogastroduodenoscopy (06/12/2011); Back surgery; Colonoscopy with propofol (N/A, 01/21/2017); Esophagogastroduodenoscopy (egd) with propofol (N/A, 01/21/2017); Oophorectomy; and Thoracic discectomy (Left, 04/24/2018). Family: family history is not on file.  Laboratory Chemistry Profile   Renal Lab Results  Component Value Date   BUN 11 11/08/2020   CREATININE 1.24 (H) 11/08/2020   GFRAA 60 (L) 11/11/2018   GFRNONAA 45 (L) 11/08/2020    Hepatic Lab Results  Component Value Date   AST 31 11/08/2020   ALT 17 11/08/2020   ALBUMIN 4.1 11/08/2020   ALKPHOS 87 11/08/2020   LIPASE 26 11/08/2020    Electrolytes Lab Results  Component Value Date   NA 137 11/08/2020   K 4.2 11/08/2020   CL 106 11/08/2020   CALCIUM 9.3 11/08/2020   MG 1.9 09/12/2012    Bone No results found for: VD25OH, VD125OH2TOT, RC7893YB0, FB5102HE5, 25OHVITD1, 25OHVITD2, 25OHVITD3, TESTOFREE, TESTOSTERONE  Inflammation (CRP: Acute Phase) (ESR: Chronic Phase) Lab Results  Component Value Date   LATICACIDVEN 2.7 ([place]) 11/08/2020         Note: Above Lab results reviewed.  Recent Imaging Review  MR BRAIN WO CONTRAST CLINICAL DATA:  Dizziness, non-specific  EXAM: MRI HEAD WITHOUT CONTRAST  TECHNIQUE: Multiplanar, multiecho pulse sequences of the brain and surrounding structures  were obtained without intravenous contrast.  COMPARISON:  CT head 11/08/2020.  MRI 09/24/2004.  FINDINGS: Brain: No acute infarction, hemorrhage, hydrocephalus, extra-axial  collection or mass lesion. Mild scattered T2 hyperintensities in the white matter, nonspecific but compatible with chronic microvascular ischemic disease that is very mild for patient age. Small remote right cerebellar lacunar infarcts.  Vascular: Major arterial flow voids are maintained at the skull base.  Skull and upper cervical spine: Normal marrow signal.  Sinuses/Orbits: Clear sinuses.  Unremarkable orbits.  Other: No sizable mastoid effusions.  IMPRESSION: 1. No evidence of acute intracranial abnormality. 2. Small remote right cerebellar lacunar infarcts.  Electronically Signed   By: Margaretha Sheffield M.D.   On: 11/16/2020 11:00 Note: Reviewed        Physical Exam  General appearance: Well nourished, well developed, and well hydrated. In no apparent acute distress Mental status: Alert, oriented x 3 (person, place, & time)       Respiratory: No evidence of acute respiratory distress Eyes: PERLA Vitals: BP 134/89 (BP Location: Left Arm, Patient Position: Sitting, Cuff Size: Large)   Pulse 61   Temp (!) 97 F (36.1 C) (Temporal)   Resp 16   Ht 5' 4"  (1.626 m)   Wt 226 lb (102.5 kg)   SpO2 100%   BMI 38.79 kg/m  BMI: Estimated body mass index is 38.79 kg/m as calculated from the following:   Height as of this encounter: 5' 4"  (1.626 m).   Weight as of this encounter: 226 lb (102.5 kg). Ideal: Ideal body weight: 54.7 kg (120 lb 9.5 oz) Adjusted ideal body weight: 73.8 kg (162 lb 12.1 oz)    Thoracic Spine Area Exam  Skin & Axial Inspection: Well healed scar from previous spine surgery detected Alignment: Symmetrical Functional ROM: Pain restricted ROM Stability: No instability detected Muscle Tone/Strength: Functionally intact. No obvious neuro-muscular anomalies detected. Sensory  (Neurological): Dermatomal pain pattern Muscle strength & Tone: No palpable anomalies Lumbar Spine Area Exam  Skin & Axial Inspection: Well healed scar from previous spine surgery detected Alignment: Symmetrical Functional ROM: Mechanically restricted ROM       Stability: No instability detected Muscle Tone/Strength: Functionally intact. No obvious neuro-muscular anomalies detected. Sensory (Neurological): Dermatomal pain pattern   Gait & Posture Assessment  Ambulation: Limited Gait: Antalgic gait (limping) Posture: Difficulty standing up straight, due to pain  Lower Extremity Exam      Side: Right lower extremity   Side: Left lower extremity  Stability: No instability observed           Stability: No instability observed          Skin & Extremity Inspection: Skin color, temperature, and hair growth are WNL. No peripheral edema or cyanosis. No masses, redness, swelling, asymmetry, or associated skin lesions. No contractures.   Skin & Extremity Inspection: Skin color, temperature, and hair growth are WNL. No peripheral edema or cyanosis. No masses, redness, swelling, asymmetry, or associated skin lesions. No contractures.  Functional ROM: Pain restricted ROM                   Functional ROM: Pain restricted ROM                  Muscle Tone/Strength: Functionally intact. No obvious neuro-muscular anomalies detected.   Muscle Tone/Strength: Functionally intact. No obvious neuro-muscular anomalies detected.  Sensory (Neurological): Dermatomal pain pattern         Sensory (Neurological): Dermatomal pain pattern        DTR: Patellar: 0: absent Achilles: deferred today Plantar: deferred today   DTR: Patellar: 0: absent Achilles: deferred today Plantar: deferred today  Palpation: No palpable anomalies   Palpation: No palpable anomalies     Assessment   Status Diagnosis  Controlled Controlled Controlled 1. History of lumbar fusion   2. Failed back surgical syndrome   3. Chronic  radicular lumbar pain   4. Spinal stenosis, lumbar region, with neurogenic claudication   5. Chronic pain syndrome       Plan of Care    Madison Jennings has a current medication list which includes the following long-term medication(s): amlodipine, azelastine, clonazepam, levothyroxine, and sertraline.  Pharmacotherapy (Medications Ordered): Meds ordered this encounter  Medications   HYDROcodone-acetaminophen (NORCO/VICODIN) 5-325 MG tablet    Sig: Take 1 tablet by mouth every 8 (eight) hours as needed for severe pain. Must last 30 days.    Dispense:  90 tablet    Refill:  0    Chronic Pain: STOP Act (Not applicable) Fill 1 day early if closed on refill date. Avoid benzodiazepines within 8 hours of opioids   HYDROcodone-acetaminophen (NORCO/VICODIN) 5-325 MG tablet    Sig: Take 1 tablet by mouth every 8 (eight) hours as needed for severe pain. Must last 30 days.    Dispense:  90 tablet    Refill:  0    Chronic Pain: STOP Act (Not applicable) Fill 1 day early if closed on refill date. Avoid benzodiazepines within 8 hours of opioids     Follow-up plan:   Return in about 7 weeks (around 02/13/2021) for Medication Management, in person.    Recent Visits Date Type Provider Dept  11/30/20 Office Visit [name], MD [facility]  Showing recent visits within past 90 days and meeting all other requirements Today's Visits Date Type Provider Dept  12/28/20 Office Visit [name], MD [facility]  Showing today's visits and meeting all other requirements Future Appointments Date Type Provider Dept  02/13/21 Appointment [name], MD [facility]  Showing future appointments within next 90 days and meeting all other requirements I discussed the assessment and treatment plan with the patient. The patient was provided an opportunity to ask questions and all were answered. The patient agreed with the plan and demonstrated an understanding of the  instructions.  Patient advised to call back or seek an in-person evaluation if the symptoms or condition worsens.  Duration of encounter: 75mnutes.  Note by: BGillis Santa MD Date: 12/28/2020; Time: 1:36 PM

## 2021-01-08 ENCOUNTER — Ambulatory Visit
Admission: RE | Admit: 2021-01-08 | Discharge: 2021-01-08 | Disposition: A | Discharge status: Home / Self Care | Payer: Medicare HMO | Source: Ambulatory Visit | Attending: Internal Medicine | Admitting: Internal Medicine

## 2021-01-08 ENCOUNTER — Other Ambulatory Visit: Payer: Self-pay

## 2021-01-08 DIAGNOSIS — Z1231 Encounter for screening mammogram for malignant neoplasm of breast: Secondary | ICD-10-CM | POA: Diagnosis not present

## 2021-01-15 NOTE — Progress Notes (Signed)
Psychiatric Initial Adult Assessment   Patient Identification: Madison Jennings MRN:  [id number] Date of Evaluation:  01/15/2021 Referral Source:  [name], MD  Chief Complaint:  "something is not right yet" Visit Diagnosis: No diagnosis found.  History of Present Illness:   Madison Jennings is a 80 y.o. year old female with a history of anxiety,  hypertension, hypothyroidism, fibromyalgia, Spinal stenosis of lumbar region without neurogenic claudication, who is referred for anxiety.   She states that she has had worsening in and anxiety since 2019.  She tried to come off from OxyContin, which she used to be taking for Madison Jennings back pain as she did not want to be on the medication.  She had significant worsening in anxiety and the panic attacks since then.  She is thinking about a variety of things all through the night.  She feels nervous, and shaking in Madison Jennings hands especially when she has exacerbation of back pain.  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  She tends to stay in the house most of the time due to anxiety.  She was able to go to church the other day.  She loved the visit there; she feels good connection with others, who pray for Madison Jennings.  She is hoping not to take many medication, and she will continue to see Dr. Zollie Scale for pain management.  She reports great support from Madison Jennings daughter, and has good connection with Madison Jennings 2 children.   She has depressive symptoms as in PHQ-9.  She thinks she has anhedonia even when she does not have pain.  She denies SI.  Although she used to have panic attacks very frequently, it is happening once in a few weeks.  She tends to have intense anxiety with palpitation, and fear of death.  She feels irritable at times.    Substance-she denies alcohol use or drug use.   Medication- sertraline 50 mg daily, clonazepam 0.5 mg TID (not on bupropion anymore due to headache)   Madison Jennings husband  presents to the interview.  Although he has nothing to add, he is concerned about the pain she experiences.   Daily routine: goes to church Exercise: n/a Employment: retired, used to work at [employer] until 52's Support: daughter, husband Household: husband Marital status: married Number of children: 3 (one son died from [place]) She was born in [place]. Pboth parents remarried, mother deceaed two days after surgery   Wt Readings from Last 3 Encounters:  01/22/21 223 lb 6.4 oz (101.3 kg)  12/28/20 226 lb (102.5 kg)  11/30/20 226 lb (102.5 kg)     Associated Signs/Symptoms: Depression Symptoms:  depressed mood, anhedonia, insomnia, fatigue, anxiety, panic attacks, (Hypo) Manic Symptoms:   denies decreased need for sleep, euphoria Anxiety Symptoms:  Excessive Worry, Panic Symptoms, Psychotic Symptoms:   denies AH, VH, paranoia PTSD Symptoms: Negative  Past Psychiatric History:  Outpatient: saw a psychiatrist in 2021 Psychiatry admission: denies Previous suicide attempt: denies  Past trials of medication: bupropion (headache), duloxetine (limited benefit), valium History of violence:    Previous Psychotropic Medications: Yes   Substance Abuse History in the last 12 months:  No.  Consequences of Substance Abuse: NA  Past Medical History:  Past Medical History:  Diagnosis Date   Arthritis    Asthma    Chronic back pain    Chronic back pain    scoliosis and 2 buldging disc  Depression    Diastolic dysfunction    Diverticulosis    Dysrhythmia    PALPITATIONS   Fibromyalgia    Gastritis    GERD (gastroesophageal reflux disease)    takes Protonix daily   Headache(784.0)    sinus related   Hyperlipidemia    just diagnosed;will follow up in 6wks to discuss meds   Hypertension    takes Lisinopril daily   Hypothyroidism    takes Synthroid daily   Ischemic colitis ([place])    Joint pain    Nocturia    Obesity    Vitamin D deficiency    just placed on Vit D this week     Past Surgical History:  Procedure Laterality Date   ABDOMINAL HYSTERECTOMY     APPENDECTOMY     BACK SURGERY     x 3   CARDIAC CATHETERIZATION  04/13/2010   normal cororanies, EF 55% ([facility]; Dr. Clayborn Bigness)   CHOLECYSTECTOMY     COLONOSCOPY     COLONOSCOPY WITH PROPOFOL N/A 01/21/2017   Procedure: COLONOSCOPY WITH PROPOFOL;  Surgeon: Lollie Sails, MD;  Location: [facility] ENDOSCOPY;  Service: Endoscopy;  Laterality: N/A;   CORONARY ANGIOPLASTY     ESOPHAGOGASTRODUODENOSCOPY  06/12/2011   ESOPHAGOGASTRODUODENOSCOPY (EGD) WITH PROPOFOL N/A 01/21/2017   Procedure: ESOPHAGOGASTRODUODENOSCOPY (EGD) WITH PROPOFOL;  Surgeon: Lollie Sails, MD;  Location: [facility] ENDOSCOPY;  Service: Endoscopy;  Laterality: N/A;   FOOT SURGERY Left    d/t neuroma   OOPHORECTOMY     radiation to thyroid     RIGHT/LEFT HEART CATH AND CORONARY ANGIOGRAPHY Bilateral 10/22/2016   Procedure: RIGHT/LEFT HEART CATH AND CORONARY ANGIOGRAPHY;  Surgeon: Yolonda Kida, MD;  Location: [place] CV LAB;  Service: Cardiovascular;  Laterality: Bilateral;   SPINAL CORD STIMULATOR INSERTION N/A 07/16/2013   Procedure: LUMBAR SPINAL CORD STIMULATOR INSERTION;  Surgeon: Bonna Gains, MD;  Location: [place];  Service: Neurosurgery;  Laterality: N/A;   THORACIC DISCECTOMY Left 04/24/2018   Procedure: Left Thoracic ten- thoracic eleven Microdiscectomy, Removal of Spinal Cord Stimulator;  Surgeon: Kristeen Miss, MD;  Location: [place];  Service: Neurosurgery;  Laterality: Left;  Left Thoracic ten- thoracic eleven Microdiscectomy, Removal of Spinal Cord Stimulator    Family Psychiatric History:  As below  Family History:  Family History  Problem Relation Age of Onset   Breast cancer Neg Hx     Social History:   Social History   Socioeconomic History   Marital status: Married    Spouse name: Not on file   Number of children: Not on file   Years of education: Not on file   Highest education level: Not on file   Occupational History   Not on file  Tobacco Use   Smoking status: Former    Packs/day: 0.50    Years: 0.00    Pack years: 0.00    Types: Cigarettes    Quit date: 06/20/1986    Years since quitting: 34.5   Smokeless tobacco: Never  Vaping Use   Vaping Use: Never used  Substance and Sexual Activity   Alcohol use: No   Drug use: No   Sexual activity: Yes  Other Topics Concern   Not on file  Social History Narrative   Not on file   Social Determinants of Health   Financial Resource Strain: Not on file  Food Insecurity: Not on file  Transportation Needs: Not on file  Physical Activity: Not on file  Stress: Not on file  Social Connections:  Not on file    Additional Social History: as above  Allergies:  No Known Allergies  Metabolic Disorder Labs: No results found for: HGBA1C, MPG No results found for: PROLACTIN Lab Results  Component Value Date   CHOL 182 09/04/2012   TRIG 137 09/04/2012   HDL 63 (H) 09/04/2012   VLDL 27 09/04/2012   LDLCALC 92 09/04/2012   Lab Results  Component Value Date   TSH 3.872 10/18/2020    Therapeutic Level Labs: No results found for: LITHIUM No results found for: CBMZ No results found for: VALPROATE  Current Medications: Current Outpatient Medications  Medication Sig Dispense Refill   acetaminophen (TYLENOL) 500 MG tablet Take 500 mg by mouth every 6 (six) hours as needed.     amLODipine (NORVASC) 2.5 MG tablet Take 2.5 mg by mouth daily.     azelastine (ASTELIN) 0.1 % nasal spray Place 1 spray into both nostrils as needed.     clonazePAM (KLONOPIN) 0.5 MG tablet Take 0.5 mg by mouth in the morning, at noon, and at bedtime.     esomeprazole (NEXIUM) 40 MG capsule Take 40 mg by mouth daily at 12 noon.     HYDROcodone-acetaminophen (NORCO/VICODIN) 5-325 MG tablet Take 1 tablet by mouth every 8 (eight) hours as needed for severe pain. Must last 30 days. 90 tablet 0   [START ON 01/27/2021] HYDROcodone-acetaminophen (NORCO/VICODIN)  5-325 MG tablet Take 1 tablet by mouth every 8 (eight) hours as needed for severe pain. Must last 30 days. 90 tablet 0   hydrOXYzine (ATARAX/VISTARIL) 25 MG tablet Take 25 mg by mouth at bedtime.     levothyroxine (SYNTHROID, LEVOTHROID) 75 MCG tablet Take 75 mcg by mouth daily before breakfast.     lisinopril (PRINIVIL,ZESTRIL) 40 MG tablet Take 40 mg by mouth daily.     sertraline (ZOLOFT) 50 MG tablet Take 1 tablet by mouth daily.     traMADol (ULTRAM-ER) 100 MG 24 hr tablet Take 1 tablet (100 mg total) by mouth daily. 30 tablet 0   No current facility-administered medications for this visit.    Musculoskeletal: Strength & Muscle Tone: within normal limits Gait & Station:  walks with a cane Patient leans: N/A  Psychiatric Specialty Exam: Review of Systems  Psychiatric/Behavioral:  Positive for dysphoric mood. Negative for agitation, behavioral problems, confusion, decreased concentration, hallucinations, self-injury, sleep disturbance and suicidal ideas. The patient is nervous/anxious. The patient is not hyperactive.   All other systems reviewed and are negative.  There were no vitals taken for this visit.There is no height or weight on file to calculate BMI.  General Appearance: Fairly Groomed  Eye Contact:  Good  Speech:  Clear and Coherent  Volume:  Normal  Mood:  Anxious  Affect:  Appropriate, Congruent, and tense  Thought Process:  Coherent  Orientation:  Full (Time, Place, and Person)  Thought Content:  Logical  Suicidal Thoughts:  No  Homicidal Thoughts:  No  Memory:  Immediate;   Good  Judgement:  Good  Insight:  Good  Psychomotor Activity:  Normal, no resting tremors, no rigidity. +postural tremors on bilateral hands  Concentration:  Concentration: Good and Attention Span: Good  Recall:  Good  Fund of Knowledge:Good  Language: Good  Akathisia:  No  Handed:  Right  AIMS (if indicated):  not done  Assets:  Communication Skills Desire for Improvement  ADL's:   Intact  Cognition: WNL  Sleep:  Fair   Screenings: [place] [facility] from 11/16/2020 in [place]  [place] [facility] from 11/08/2020 in [place] [facility] from 10/18/2020 in [place]  C-SSRS RISK CATEGORY No Risk No Risk No Risk       Assessment and Plan:  DANEISHA SURGES is a 80 y.o. year old female with a history of anxiety,  hypertension, hypothyroidism, fibromyalgia, Spinal stenosis of lumbar region without neurogenic claudication, who is referred for anxiety.   1. GAD (generalized anxiety disorder) 2. MDD (major depressive disorder), recurrent episode, moderate ([place]) She reports anxiety and depressive symptoms over the past few years in the context of pain, although it has been improving a little since being started on sertraline by Madison Jennings PCP.  Psychosocial stressors includes demoralization due to pain/limited activity and related to this condition.  She reports great support from Madison Jennings husband and the church.  Will do further up titration of sertraline to optimize treatment for depression and anxiety.  She agrees to taper down clonazepam to avoid potential risk of drowsiness/fall, dependence, tolerance, and respiratory suppression especially with concomitant use of opioid.  Although she will greatly benefit from CBT, she is not interested in this option at this time.   Plan Increase sertraline 100 mg daily Decrease clonazepam 0.5 mg twice a day (was taking 0.5 mg TID) Next appointment- 1/16 at 2 PM for 30 mins, in person I have utilized the [place] Controlled Substances Reporting System (PMP AWARxE) to confirm adherence regarding the patient's medication. My review reveals appropriate prescription fills.    The patient demonstrates the following risk factors for suicide: Chronic risk factors for suicide include: psychiatric disorder of anxiety and chronic pain. Acute risk factors for suicide  include: unemployment. Protective factors for this patient include: positive social support, responsibility to others (children, family), coping skills, and hope for the future. Considering these factors, the overall suicide risk at this point appears to be low. Patient is appropriate for outpatient follow up.   Norman Clay, MD 11/21/202212:47 PM

## 2021-01-22 ENCOUNTER — Other Ambulatory Visit: Payer: Self-pay

## 2021-01-22 ENCOUNTER — Ambulatory Visit (INDEPENDENT_AMBULATORY_CARE_PROVIDER_SITE_OTHER): Payer: Medicare HMO | Admitting: Psychiatry

## 2021-01-22 ENCOUNTER — Encounter: Payer: Self-pay | Admitting: Psychiatry

## 2021-01-22 VITALS — BP 119/79 | HR 85 | Temp 97.3°F | Wt 223.4 lb

## 2021-01-22 DIAGNOSIS — F411 Generalized anxiety disorder: Secondary | ICD-10-CM | POA: Diagnosis not present

## 2021-01-22 DIAGNOSIS — F331 Major depressive disorder, recurrent, moderate: Secondary | ICD-10-CM | POA: Diagnosis not present

## 2021-01-22 MED ORDER — SERTRALINE HCL 100 MG PO TABS: 100.0000 mg | ORAL_TABLET | Freq: Every day | ORAL | 1 refills | Status: DC

## 2021-01-22 MED ORDER — CLONAZEPAM 0.5 MG PO TABS: 0.5000 mg | ORAL_TABLET | Freq: Two times a day (BID) | ORAL | 0 refills | Status: DC | PRN

## 2021-01-22 NOTE — Patient Instructions (Signed)
Increase sertraline 100 mg daily Decrease clonazepam 0.5 mg twice a day Next appointment- 1/16 at 2 PM

## 2021-01-30 ENCOUNTER — Telehealth: Payer: Self-pay

## 2021-01-30 ENCOUNTER — Other Ambulatory Visit: Payer: Self-pay | Admitting: Psychiatry

## 2021-01-30 MED ORDER — CLONAZEPAM 0.5 MG PO TABS: 0.5000 mg | ORAL_TABLET | Freq: Two times a day (BID) | ORAL | 0 refills | Status: DC | PRN

## 2021-01-30 NOTE — Telephone Encounter (Signed)
Discussed with the patient.  She has been feeling sick, and a headache since taking sertraline 100 mg daily.  She has been also taking clonazepam twice a day instead of taking 3 times a day.  She denies SI.  She agrees with the following.  -Decrease sertraline 50 mg daily.  After her physical symptoms resolves, she will increase sertraline to 75 mg daily -She will continue clonazepam 0.5 mg twice a day as needed for anxiety -She will contact the office if any nor improvement in her physical symptoms

## 2021-01-30 NOTE — Telephone Encounter (Signed)
pt states she needs to speak with you she states that she is sick from the medication changes and that something needs to be done.

## 2021-02-05 NOTE — Telephone Encounter (Signed)
Discussed with the patient.  She states that her headache subsided after reducing sertraline back to 50 mg.  She has not tried 75 mg yet.  She denies SI.  She agrees with the following.  -Increase sertraline 75 mg daily; she thinks she has enough meds to last until the next visit -Keeps her appointment in January

## 2021-02-05 NOTE — Telephone Encounter (Signed)
pt states she still not doing well she still nervous and feeling anxiety and she wanted to speak with you

## 2021-02-12 ENCOUNTER — Telehealth: Payer: Self-pay

## 2021-02-12 DIAGNOSIS — F411 Generalized anxiety disorder: Secondary | ICD-10-CM

## 2021-02-12 DIAGNOSIS — F331 Major depressive disorder, recurrent, moderate: Secondary | ICD-10-CM

## 2021-02-12 MED ORDER — SERTRALINE HCL 50 MG PO TABS
25.0000 mg | ORAL_TABLET | Freq: Every day | ORAL | 0 refills | Status: DC
Start: 2021-02-12 — End: 2021-03-12

## 2021-02-12 MED ORDER — MIRTAZAPINE 7.5 MG PO TABS: 7.5000 mg | ORAL_TABLET | Freq: Every day | ORAL | 1 refills | Status: DC

## 2021-02-12 NOTE — Telephone Encounter (Signed)
Returned call to patient.  Patient reports she feels sicker since being on this Zoloft.  She feels since going up on the dose to 75 mg she feels much worse.  She reports she has not been sleeping and feels nauseous all the time.  We will reduce sertraline to 25 mg daily for 5 days then advised patient to stop taking it.  Start mirtazapine 7.5 mg p.o. nightly for mood and sleep.  Advised patient to give  gap between taking her bedtime medication as well as Klonopin so that she is not taking them all together at the same time.  Provided education about mirtazapine, I have sent information for her to read.  Patient advised to go to the nearest emergency department if she has any worsening side effects or her symptoms gets worse.  I have reviewed medical records and I have also spoken to Dr. Leonard Schwartz primary provider.

## 2021-02-12 NOTE — Telephone Encounter (Signed)
pt states that the medication that dr. Modesta Messing has her on is not working for her. she feels out of control and worse. she wants to speak with some one today.

## 2021-02-13 ENCOUNTER — Other Ambulatory Visit: Payer: Self-pay

## 2021-02-13 ENCOUNTER — Ambulatory Visit
Payer: Medicare HMO | Attending: Student in an Organized Health Care Education/Training Program | Admitting: Student in an Organized Health Care Education/Training Program

## 2021-02-13 ENCOUNTER — Encounter: Payer: Self-pay | Admitting: Student in an Organized Health Care Education/Training Program

## 2021-02-13 VITALS — BP 129/70 | HR 61 | Temp 98.1°F | Resp 18 | Ht 65.0 in | Wt 202.0 lb

## 2021-02-13 DIAGNOSIS — M5416 Radiculopathy, lumbar region: Secondary | ICD-10-CM | POA: Diagnosis not present

## 2021-02-13 DIAGNOSIS — G8929 Other chronic pain: Secondary | ICD-10-CM | POA: Diagnosis present

## 2021-02-13 DIAGNOSIS — G894 Chronic pain syndrome: Secondary | ICD-10-CM | POA: Diagnosis present

## 2021-02-13 DIAGNOSIS — M961 Postlaminectomy syndrome, not elsewhere classified: Secondary | ICD-10-CM

## 2021-02-13 DIAGNOSIS — Z981 Arthrodesis status: Secondary | ICD-10-CM

## 2021-02-13 DIAGNOSIS — M48062 Spinal stenosis, lumbar region with neurogenic claudication: Secondary | ICD-10-CM | POA: Diagnosis not present

## 2021-02-13 MED ORDER — BUPRENORPHINE 5 MCG/HR TD PTWK
1.0000 | MEDICATED_PATCH | TRANSDERMAL | 1 refills | Status: DC
Start: 2021-02-13 — End: 2021-03-22

## 2021-02-13 NOTE — Patient Instructions (Signed)
Buprenorphine Patches What is this medication? BUPRENORPHINE (byoo pre NOR feen) treats severe, chronic pain. It is prescribed when other pain medications have not worked or cannot be tolerated. It works by blocking pain signals in the brain. It belongs to a group of medications called opioids. This medication is long-acting. Do not use it to treat sudden pain. This medicine may be used for other purposes; ask your health care provider or pharmacist if you have questions. COMMON BRAND NAME(S): Butrans What should I tell my care team before I take this medication? They need to know if you have any of these conditions: Brain tumor Drug abuse or addiction Gallbladder disease Head injury Heart disease If you often drink alcohol Irregular heartbeat or rhythm Liver disease Low adrenal gland function Lung disease, asthma, or breathing problem Pancreatic disease Seizures Stomach or intestine problems Taken an MAOI like Marplan, Nardil, or Parnate in the last 14 days An unusual or allergic reaction to buprenorphine, other medications, foods, dyes, or preservatives Pregnant or trying to get pregnant Breast-feeding How should I use this medication? This medication is for external use only. Apply the patch to your skin. Select a clean, dry area of skin on your upper outer arm, upper chest, upper back, or the side of the chest. The upper back is a good spot to put the patch on people who are confused because it will be hard for them to remove the patch. Do not apply the patch to oily, broken, burned, cut, or irritated skin. Use only water to clean the area. Do not use soap or alcohol to clean the skin because this can increase the effects of the medication. If the area is hairy, clip the hair with scissors, but do not shave. Do not cut or damage the patch. A cut or damaged patch can be very dangerous because you may get too much medication. Take the patch out of its wrapper, and take off the protective  strip over the sticky part. Do not use a patch if the packaging or backing is damaged. Do not touch the sticky part with your fingers. Press the sticky surface to the skin using the palm of your hand. Press the patch to the skin for 15 seconds. Wash your hands at once with soap and water. Keep patches far away from children. Do not let children see you apply the patch and do not apply it where children can see it. Do not call the patch a sticker, tattoo, or bandage, as this could encourage the child to mimic your actions. Used patches still contain medication. Children or pets can have serious side effects or die from putting used patches in their mouth or on their bodies. Take off the old patch before putting on a new patch. Apply each new patch to a different area of skin. If a patch comes off or causes irritation, remove it and apply a new patch to different site. If the edges of the patch start to loosen, apply first aid tape to the edges of the patch. If problems with the patch not sticking continue, cover the patch with a see-through adhesive dressing (like Bioclusive or Tegaderm). Never cover the patch with any other bandage or tape. A special MedGuide will be given to you by the pharmacist with each prescription and refill. Be sure to read this information carefully each time. This medication comes with INSTRUCTIONS FOR USE. Ask your pharmacist for directions on how to use this medication. Read the information carefully. Talk to  your pharmacist or care team if you have questions. Talk to your care team about the use of this medication in children. Special care may be needed. Overdosage: If you think you have taken too much of this medicine contact a poison control center or emergency room at once. NOTE: This medicine is only for you. Do not share this medicine with others. What if I miss a dose? If you miss a dose, use it as soon as you can. If it is almost time for your next dose, use only that  dose. Do not use double or extra doses. What may interact with this medication? Do not take this medication with any of the following: Cisapride Dronedarone Pimozide Safinamide Samidorphan Thioridazine This medication may also interact with the following: Alcohol Antihistamines for allergy, cough and cold Certain antibiotics like clarithromycin, erythromycin Certain antivirals for hepatitis or HIV Certain medications for anxiety or sleep Certain medications for bladder problems like oxybutynin, tolterodine Certain medications for depression or psychotic disorders Certain medications for fungal infections like fluconazole, ketoconazole, posaconazole Certain medications for migraine headache like almotriptan, eletriptan, frovatriptan, naratriptan, rizatriptan, sumatriptan, zolmitriptan Certain medications for nausea or vomiting like dolasetron, ondansetron, palonosetron Certain medications for Parkinson's disease like benztropine, trihexyphenidyl Certain medications for seizures like carbamazepine, phenobarbital, phenytoin Certain medications for stomach problems like dicyclomine, hyoscyamine Certain medications for travel sickness like scopolamine Diuretics General anesthetics like halothane, isoflurane, methoxyflurane, propofol Ipratropium Linezolid MAOIs like Carbex, Eldepryl, Marplan, Nardil, and Parnate Medications that relax muscles like cyclobenzaprine, metaxalone Methylene blue (injected into a vein) Other medications that prolong the QT interval (cause an abnormal heart rhythm) Other narcotic medications for pain or cough Phenothiazines like chlorpromazine, prochlorperazine Rifampin This list may not describe all possible interactions. Give your health care provider a list of all the medicines, herbs, non-prescription drugs, or dietary supplements you use. Also tell them if you smoke, drink alcohol, or use illegal drugs. Some items may interact with your medicine. What  should I watch for while using this medication? Tell your care team if your pain does not go away, if it gets worse, or if you have new or a different type of pain. You may develop tolerance to this medication. Tolerance means that you will need a higher dose of the medication for pain relief. Tolerance is normal and is expected if you take this medication for a long time. Do not suddenly stop taking your medication because you may develop a severe reaction. Your body becomes used to the medication. This does NOT mean you are addicted. Addiction is a behavior related to getting and using a medication for a nonmedical reason. If you have pain, you have a medical reason to take pain medication. Your care team will tell you how much medication to take. If your care team wants you to stop the medication, the dose will be slowly lowered over time to avoid any side effects. If you take other medications that also cause drowsiness such as other narcotic pain medications, benzodiazepines, or other medications for sleep, you may have more side effects. Give your care team a list of all medications you use. They will tell you how much medication to take. Do not take more medication than directed. Call emergency services if you have trouble breathing or are unusually tired or sleepy. Talk to your care team about naloxone and how to get it. Naloxone is an emergency medication used for an opioid overdose. An overdose can happen if you take too much opioid. It can  also happen if an opioid is taken with some other medications or substances, such as alcohol. Know the symptoms of an overdose, such as trouble breathing, being unusually tired or sleepy, or not being able to respond or wake up. Make sure to tell caregivers and close contacts where it is stored. Make sure they know how to use it. After naloxone is given, you must call emergency services. Naloxone is a temporary treatment. Repeat doses may be needed. You may get  drowsy or dizzy. Do not drive, use machinery, or do anything that needs mental alertness until you know how this medication affects you. Do not stand up or sit up quickly, especially if you are an older patient. This reduces the risk of dizzy or fainting spells. Alcohol may interfere with the effect of this medication. Avoid alcoholic drinks. This medication will cause constipation. If you do not have a bowel movement for 3 days, call your care team. Your mouth may get dry. Chewing sugarless gum or sucking hard candy and drinking plenty of water may help. Contact your care team if the problem does not go away or is severe. This patch is sensitive to body heat changes. If your skin gets too hot, more medication will come out of the patch and can cause a deadly overdose. Call your care team if you get a fever. Do not take hot baths. Do not sunbathe. Do not use hot tubs, saunas, hairdryers, heating pads, electric blankets, heated waterbeds, or tanning lamps. Do not do exercise that increases your body temperature. If you are going to need surgery, an MRI, CT scan, or other procedure, tell your care team that you are using this medication. You may need to remove the patch before the procedure. What side effects may I notice from receiving this medication? Side effects that you should report to your care team as soon as possible: Allergic reactions--skin rash, itching, hives, swelling of the face, lips, tongue, or throat CNS depression--slow or shallow breathing, shortness of breath, feeling faint, dizziness, confusion, trouble staying awake Heart rhythm changes--fast or irregular heartbeat, dizziness, feeling faint or lightheaded, chest pain, trouble breathing Liver injury--right upper belly pain, loss of appetite, nausea, light-colored stool, dark yellow or brown urine, yellowing skin or eyes, unusual weakness or fatigue Low adrenal gland function--nausea, vomiting, loss of appetite, unusual weakness or  fatigue, dizziness Low blood pressure--dizziness, feeling faint or lightheaded, blurry vision Side effects that usually do not require medical attention (report to your care team if they continue or are bothersome): Constipation Dizziness Drowsiness Dry mouth Headache Irritation at application site Nausea Vomiting This list may not describe all possible side effects. Call your doctor for medical advice about side effects. You may report side effects to FDA at 1-800-FDA-1088. Where should I keep my medication? Keep out of the reach of children and pets. This medication can be abused. Keep it in a safe place to protect it from theft. Do not share it with anyone. It is only for you. Selling or giving away this medication is dangerous and against the law. Store at room temperature at 25 degrees C (77 degrees F). Keep this medication in the original packaging until you are ready to take it. Get rid of any unused medication after the expiration date. This medication may cause harm and death if it is taken by other adults, children, or pets. It is important to get rid of the medication as soon as you no longer need it, or it is expired. You  can do this in two ways: Take the medication to a medication take-back program. Check with your pharmacy or law enforcement to find a location. If you cannot return the medication, remove it from the packaging and flush it down the toilet. NOTE: This sheet is a summary. It may not cover all possible information. If you have questions about this medicine, talk to your doctor, pharmacist, or health care provider.  2022 [employer] (2020-09-08 00:00:00)

## 2021-02-13 NOTE — Progress Notes (Signed)
PROVIDER NOTE: Information contained herein reflects review and annotations entered in association with encounter. Interpretation of such information and data should be left to medically-trained personnel. Information provided to patient can be located elsewhere in the medical record under "Patient Instructions". Document created using STT-dictation technology, any transcriptional errors that may result from process are unintentional.    Patient: Madison Jennings  Service Category: E/M  Provider: [name], MD  DOB: December 14, 1940  DOS: 02/13/2021  Specialty: Interventional Pain Management  MRN: [id number]  Setting: Ambulatory outpatient  PCP: [name], MD  Type: Established Patient    Referring Provider: [name], MD  Location: Office  Delivery: Face-to-face     HPI  Madison Jennings, a 80 y.o. year old female, is here today because of her History of lumbar fusion [Z98.1]. Madison Jennings primary complain today is Back Pain (low)  Last encounter: My last encounter with her was on 12/28/20  Pain Assessment: Severity of Chronic pain is reported as a 7 /10. Location: Back Lower/denies. Onset: More than a month ago. Quality: Aching, Discomfort. Timing: Constant. Modifying factor(s): nothing. Vitals:  height is 5' 5"  (1.651 m) and weight is 202 lb (91.6 kg). Her temperature is 98.1 F (36.7 C). Her blood pressure is 129/70 and her pulse is 61. Her respiration is 18 and oxygen saturation is 98%.   Reason for encounter: medication management.    Patient presents today for medication management.  She is having increased pain and anxiety.  She states that her last dose of hydrocodone was this last weekend.  She is being followed by psychiatry.  Her Zoloft dose was reduced to 25 mg and then discontinued and she was instructed to start mirtazapine 7.5 mg for mood and sleep.  Recommend trial of buprenorphine as tramadol and hydrocodone were not very effective.  Previously her Haze Rushing was not  approved by insurance but the patient has attempted to try tramadol and hydrocodone with limited response.  From a side effect profile, I believe the patient will do better with buprenorphine transdermal patch as well.   Pharmacotherapy Assessment  Analgesic: Trial of Butrans at 5 mcg an hour.  Monitoring: [place] PMP: PDMP reviewed during this encounter.       Pharmacotherapy: No side-effects or adverse reactions reported. Compliance: No problems identified. Effectiveness: Clinically acceptable.  Dewayne Shorter, RN  02/13/2021  1:08 PM  Sign when Signing Visit Nursing Pain Medication Assessment:  Safety precautions to be maintained throughout the outpatient stay will include: orient to surroundings, keep bed in low position, maintain call bell within reach at all times, provide assistance with transfer out of bed and ambulation.  Medication Inspection Compliance: Pill count conducted under aseptic conditions, in front of the patient. Neither the pills nor the bottle was removed from the patient's sight at any time. Once count was completed pills were immediately returned to the patient in their original bottle.  Medication: Hydrocodone/APAP Pill/Patch Count:  18 of 90 pills remain Pill/Patch Appearance: Markings consistent with prescribed medication Bottle Appearance: Standard pharmacy container. Clearly labeled. Filled Date: 51 / 3 / 2022 Last Medication intake:  Today     UDS:  Summary  Date Value Ref Range Status  11/30/2020 Note  Final    Comment:    ==================================================================== Compliance Drug Analysis, Ur ==================================================================== Test                             Result  Flag       Units  Drug Present and Declared for Prescription Verification   Desmethyldiazepam              349          EXPECTED   ng/mg creat   Oxazepam                       1247         EXPECTED   ng/mg creat   Temazepam                       996          EXPECTED   ng/mg creat    Desmethyldiazepam, oxazepam, and temazepam are expected metabolites    of diazepam. Desmethyldiazepam and oxazepam are also expected    metabolites of other drugs, including chlordiazepoxide, prazepam,    clorazepate, and halazepam. Oxazepam is an expected metabolite of    temazepam. Oxazepam and temazepam are also available as scheduled    prescription medications.    Bupropion                      PRESENT      EXPECTED   Hydroxybupropion               PRESENT      EXPECTED    Hydroxybupropion is an expected metabolite of bupropion.    Acetaminophen                  PRESENT      EXPECTED   Hydroxyzine                    PRESENT      EXPECTED  Drug Present not Declared for Prescription Verification   Norhydrocodone                 369          UNEXPECTED ng/mg creat    Norhydrocodone is an expected metabolite of hydrocodone.    Gabapentin                     PRESENT      UNEXPECTED  Drug Absent but Declared for Prescription Verification   Buprenorphine                  Not Detected UNEXPECTED ng/mg creat    Transdermal buprenorphine, as indicated in the declared medication    list, is not always detected even when used as directed.    Ibuprofen                      Not Detected UNEXPECTED    Ibuprofen, as indicated in the declared medication list, is not    always detected even when used as directed.  ==================================================================== Test                      Result    Flag   Units      Ref Range   Creatinine              55               mg/dL      >=20 ==================================================================== Declared Medications:  The flagging and interpretation on this report are based on the  following declared medications.  Unexpected results may arise from  inaccuracies in the declared medications.   **Note: The testing scope of this panel includes these  medications:   Bupropion  Diazepam (Valium)  Hydroxyzine (Atarax)   **Note: The testing scope of this panel does not include small to  moderate amounts of these reported medications:   Acetaminophen (Tylenol)  Buprenorphine Patch (BuTrans)  Ibuprofen (Advil)   **Note: The testing scope of this panel does not include the  following reported medications:   Amlodipine (Norvasc)  Esomeprazole (Nexium)  Levothyroxine (Synthroid)  Lisinopril (Zestril) ==================================================================== For clinical consultation, please call [phone]. ====================================================================      ROS  Constitutional: Denies any fever or chills Gastrointestinal: No reported hemesis, hematochezia, vomiting, or acute GI distress Musculoskeletal: Denies any acute onset joint swelling, redness, loss of ROM, or weakness Neurological: No reported episodes of acute onset apraxia, aphasia, dysarthria, agnosia, amnesia, paralysis, loss of coordination, or loss of consciousness  Medication Review  HYDROcodone-acetaminophen, Hyoscyamine Sulfate SL, acetaminophen, amLODipine, azelastine, buprenorphine, clonazePAM, esomeprazole, hydrOXYzine, levothyroxine, lisinopril, mirtazapine, and sertraline  History Review  Allergy: Madison Jennings has No Known Allergies. Drug: Madison Jennings  reports no history of drug use. Alcohol:  reports no history of alcohol use. Tobacco:  reports that she quit smoking about 34 years ago. Her smoking use included cigarettes. She smoked an average of 0.50 packs per day. She has never used smokeless tobacco. Social: Madison Jennings  reports that she quit smoking about 34 years ago. Her smoking use included cigarettes. She smoked an average of 0.50 packs per day. She has never used smokeless tobacco. She reports that she does not drink alcohol and does not use drugs. Medical:  has a past medical history of Anxiety, Arthritis, Asthma,  Chronic back pain, Chronic back pain, Depression, Diastolic dysfunction, Diverticulosis, Dysrhythmia, Fibromyalgia, Gastritis, GERD (gastroesophageal reflux disease), Headache(784.0), Hyperlipidemia, Hypertension, Hypothyroidism, Ischemic colitis ([place]), Joint pain, Nocturia, Obesity, and Vitamin D deficiency. Surgical: Madison Jennings  has a past surgical history that includes Abdominal hysterectomy; Cholecystectomy; Foot surgery (Left); radiation to thyroid; Colonoscopy; Cardiac catheterization (04/13/2010); Spinal cord stimulator insertion (N/A, 07/16/2013); RIGHT/LEFT HEART CATH AND CORONARY ANGIOGRAPHY (Bilateral, 10/22/2016); Appendectomy; Coronary angioplasty; Esophagogastroduodenoscopy (06/12/2011); Back surgery; Colonoscopy with propofol (N/A, 01/21/2017); Esophagogastroduodenoscopy (egd) with propofol (N/A, 01/21/2017); Oophorectomy; and Thoracic discectomy (Left, 04/24/2018). Family: family history includes Anxiety disorder in her mother.  Laboratory Chemistry Profile   Renal Lab Results  Component Value Date   BUN 11 11/08/2020   CREATININE 1.24 (H) 11/08/2020   GFRAA 60 (L) 11/11/2018   GFRNONAA 45 (L) 11/08/2020    Hepatic Lab Results  Component Value Date   AST 31 11/08/2020   ALT 17 11/08/2020   ALBUMIN 4.1 11/08/2020   ALKPHOS 87 11/08/2020   LIPASE 26 11/08/2020    Electrolytes Lab Results  Component Value Date   NA 137 11/08/2020   K 4.2 11/08/2020   CL 106 11/08/2020   CALCIUM 9.3 11/08/2020   MG 1.9 09/12/2012    Bone No results found for: VD25OH, VD125OH2TOT, SV7793JQ3, ES9233AQ7, 25OHVITD1, 25OHVITD2, 25OHVITD3, TESTOFREE, TESTOSTERONE  Inflammation (CRP: Acute Phase) (ESR: Chronic Phase) Lab Results  Component Value Date   LATICACIDVEN 2.7 ([place]) 11/08/2020         Note: Above Lab results reviewed.  Recent Imaging Review  MM 3D SCREEN BREAST BILATERAL CLINICAL DATA:  Screening.  EXAM: DIGITAL SCREENING BILATERAL MAMMOGRAM WITH TOMOSYNTHESIS AND  CAD  TECHNIQUE: Bilateral screening digital craniocaudal and mediolateral oblique mammograms were obtained. Bilateral screening digital breast tomosynthesis was performed. The images were evaluated with  computer-aided detection.  COMPARISON:  Previous exam(s).  ACR Breast Density Category b: There are scattered areas of fibroglandular density.  FINDINGS: There are no findings suspicious for malignancy.  IMPRESSION: No mammographic evidence of malignancy. A result letter of this screening mammogram will be mailed directly to the patient.  RECOMMENDATION: Screening mammogram in one year. (Code:SM-B-01Y)  BI-RADS CATEGORY  1: Negative.  Electronically Signed   By: Lillia Mountain M.D.   On: 01/08/2021 16:25  Note: Reviewed        Physical Exam  General appearance: Well nourished, well developed, and well hydrated. In no apparent acute distress Mental status: Alert, oriented x 3 (person, place, & time)       Respiratory: No evidence of acute respiratory distress Eyes: PERLA Vitals: BP 129/70    Pulse 61    Temp 98.1 F (36.7 C)    Resp 18    Ht 5' 5"  (1.651 m)    Wt 202 lb (91.6 kg)    SpO2 98%    BMI 33.61 kg/m  BMI: Estimated body mass index is 33.61 kg/m as calculated from the following:   Height as of this encounter: 5' 5"  (1.651 m).   Weight as of this encounter: 202 lb (91.6 kg). Ideal: Ideal body weight: 57 kg (125 lb 10.6 oz) Adjusted ideal body weight: 70.9 kg (156 lb 3.2 oz)    Thoracic Spine Area Exam  Skin & Axial Inspection: Well healed scar from previous spine surgery detected Alignment: Symmetrical Functional ROM: Pain restricted ROM Stability: No instability detected Muscle Tone/Strength: Functionally intact. No obvious neuro-muscular anomalies detected. Sensory (Neurological): Dermatomal pain pattern Muscle strength & Tone: No palpable anomalies Lumbar Spine Area Exam  Skin & Axial Inspection: Well healed scar from previous spine surgery  detected Alignment: Symmetrical Functional ROM: Mechanically restricted ROM       Stability: No instability detected Muscle Tone/Strength: Functionally intact. No obvious neuro-muscular anomalies detected. Sensory (Neurological): Dermatomal pain pattern   Gait & Posture Assessment  Ambulation: Limited Gait: Antalgic gait (limping) Posture: Difficulty standing up straight, due to pain  Lower Extremity Exam      Side: Right lower extremity   Side: Left lower extremity  Stability: No instability observed           Stability: No instability observed          Skin & Extremity Inspection: Skin color, temperature, and hair growth are WNL. No peripheral edema or cyanosis. No masses, redness, swelling, asymmetry, or associated skin lesions. No contractures.   Skin & Extremity Inspection: Skin color, temperature, and hair growth are WNL. No peripheral edema or cyanosis. No masses, redness, swelling, asymmetry, or associated skin lesions. No contractures.  Functional ROM: Pain restricted ROM                   Functional ROM: Pain restricted ROM                  Muscle Tone/Strength: Functionally intact. No obvious neuro-muscular anomalies detected.   Muscle Tone/Strength: Functionally intact. No obvious neuro-muscular anomalies detected.  Sensory (Neurological): Dermatomal pain pattern         Sensory (Neurological): Dermatomal pain pattern        DTR: Patellar: 0: absent Achilles: deferred today Plantar: deferred today   DTR: Patellar: 0: absent Achilles: deferred today Plantar: deferred today  Palpation: No palpable anomalies   Palpation: No palpable anomalies     Assessment   Status Diagnosis  Controlled Controlled Controlled  1. History of lumbar fusion   2. Failed back surgical syndrome   3. Spinal stenosis, lumbar region, with neurogenic claudication   4. Chronic radicular lumbar pain   5. Chronic pain syndrome       Plan of Care    Madison Jennings has a current medication  list which includes the following long-term medication(s): amlodipine, azelastine, clonazepam, hyoscyamine sulfate sl, levothyroxine, mirtazapine, sertraline, and clonazepam.  Pharmacotherapy (Medications Ordered): Meds ordered this encounter  Medications   buprenorphine (BUTRANS) 5 MCG/HR PTWK    Sig: Place 1 patch onto the skin once a week.    Dispense:  4 patch    Refill:  1    Chronic Pain: STOP Act (Not applicable) Fill 1 day early if closed on refill date. Avoid benzodiazepines within 8 hours of opioids     Follow-up plan:   Return in about 8 weeks (around 04/10/2021) for Medication Management, in person.    Recent Visits Date Type Provider Dept  12/28/20 Office Visit [name], MD [facility]  11/30/20 Office Visit [name], MD [facility]  Showing recent visits within past 90 days and meeting all other requirements Today's Visits Date Type Provider Dept  02/13/21 Office Visit [name], MD [facility]  Showing today's visits and meeting all other requirements Future Appointments Date Type Provider Dept  04/05/21 Appointment [name], MD [facility]  Showing future appointments within next 90 days and meeting all other requirements  I discussed the assessment and treatment plan with the patient. The patient was provided an opportunity to ask questions and all were answered. The patient agreed with the plan and demonstrated an understanding of the instructions.  Patient advised to call back or seek an in-person evaluation if the symptoms or condition worsens.  Duration of encounter: 1mnutes.  Note by: BGillis Santa MD Date: 02/13/2021; Time: 2:00 PM

## 2021-02-13 NOTE — Progress Notes (Signed)
Nursing Pain Medication Assessment:  Safety precautions to be maintained throughout the outpatient stay will include: orient to surroundings, keep bed in low position, maintain call bell within reach at all times, provide assistance with transfer out of bed and ambulation.  Medication Inspection Compliance: Pill count conducted under aseptic conditions, in front of the patient. Neither the pills nor the bottle was removed from the patient's sight at any time. Once count was completed pills were immediately returned to the patient in their original bottle.  Medication: Hydrocodone/APAP Pill/Patch Count:  18 of 90 pills remain Pill/Patch Appearance: Markings consistent with prescribed medication Bottle Appearance: Standard pharmacy container. Clearly labeled. Filled Date: 53 / 3 / 2022 Last Medication intake:  Today

## 2021-02-14 ENCOUNTER — Telehealth: Payer: Self-pay

## 2021-02-14 NOTE — Telephone Encounter (Signed)
Appointment - Telephone call with patient after she left a message stating she had spoken with Dr.Eappn, covering for Dr. Modesta Messing on 02/13/21 about some medication changes but did want to do as she suggested and to make and earlier appt with Dr. Modesta Messing than currently scheduled for 03/12/21.  Patient stated she has decreased her sertraline to 25 mg a day and started the mirtazapine 7.5 mg at night. Patient reported no problems with these changes but wanted to see if could go ahead and set up an earlier appointment for January with Dr. Modesta Messing.  Agreed to send request to our admin staff to assist with rescheduling if an earlier appointment time as available.

## 2021-02-20 NOTE — Telephone Encounter (Signed)
Medication management - Telephone call with patient, after she left a message, she was trying to get an earlier appt with Dr. Modesta Messing.  Pt stated understanding there were none available prior to 03/12/21 already scheduled but would send Dr. Modesta Messing a message she would like to speak to her upon her return on 02/27/21.  Patient denied any current crisis or emergent issue and will call the office back this week if any worsening of symptoms or concerns. Agreed to leave a message for Dr. Modesta Messing that patient would like to speak with her when she return in January.

## 2021-02-27 NOTE — Telephone Encounter (Signed)
Discussed with the patient.  She continues to feel anxiety, and has occasional chest pain.  She also reports back pain as she is unable to take Butrans patch as she is concerned about is potential interaction with Klonopin.  She has started mirtazapine about 2 weeks ago without any side effect.  She denies SI.  Provided psychoeducation that it takes 4 to 6 weeks for antidepressant to be effective.  Recommended the following, and she agrees with the plans.  -Continue mirtazapine 7.5 mg at night -Continue clonazepam 0.5 mg twice a day as needed for anxiety.  She may consider taking this medication less frequently (given her concern of interaction with opioid) -Although it was discussed to contact her pain provider so that she can use opioid with clonazepam, she states that she did already talk with the provider, and she does not want to take both opioid and the clonazepam, although provider did prescribe her the medication.

## 2021-03-01 ENCOUNTER — Encounter: Payer: Medicare HMO | Admitting: Student in an Organized Health Care Education/Training Program

## 2021-03-08 NOTE — Progress Notes (Signed)
[place] [facility] MD/PA/NP OP Progress Note  03/12/2021 2:55 PM Madison Jennings  MRN:  [id number]  Chief Complaint:  Chief Complaint   Depression; Follow-up; Anxiety    HPI:  This is a follow-up appointment for depression and anxiety.  She states that she does not feel good.  She feels bad, and has stopped going to church.  She wishes to feel better to do things more.  She feels tired of feeling this way, although she denies SI.  She started to have vivid dreams since being on mirtazapine.  She also thinks her increasing in appetite might be attributable to mirtazapine.  Her pain medication was switched from patch to hydrocodone.  She occasionally has had "smothering feeling" with anxiety when she takes this medication.  She has depressive symptoms as in PHQ-9.  She has occasional panic attacks.  She is willing to try the medication as below.   Her husband presents to the interview.  He states that she is not doing well.  He states that there is something going on when she is unable to go to church as they usually go there regularly.   Exercise: n/a Employment: retired, used to work at [employer] until 52's Support: daughter, husband Household: husband Marital status: married Number of children: 3 (one son died from [place]) She was born in [place]. Pboth parents remarried, mother deceased two days after surgery  Wt Readings from Last 3 Encounters:  03/12/21 222 lb 12.8 oz (101.1 kg)  02/13/21 202 lb (91.6 kg)  01/22/21 223 lb 6.4 oz (101.3 kg)     Visit Diagnosis:    ICD-10-CM   1. GAD (generalized anxiety disorder)  F41.1     2. MDD (major depressive disorder), recurrent episode, moderate ([place])  F33.1       Past Psychiatric History: Please see initial evaluation for full details. I have reviewed the history. No updates at this time.     Past Medical History:  Past Medical History:  Diagnosis Date   Anxiety    Arthritis    Asthma    Chronic back pain    Chronic back pain    scoliosis and 2 buldging  disc   Depression    Diastolic dysfunction    Diverticulosis    Dysrhythmia    PALPITATIONS   Fibromyalgia    Gastritis    GERD (gastroesophageal reflux disease)    takes Protonix daily   Headache(784.0)    sinus related   Hyperlipidemia    just diagnosed;will follow up in 6wks to discuss meds   Hypertension    takes Lisinopril daily   Hypothyroidism    takes Synthroid daily   Ischemic colitis ([place])    Joint pain    Nocturia    Obesity    Vitamin D deficiency    just placed on Vit D this week    Past Surgical History:  Procedure Laterality Date   ABDOMINAL HYSTERECTOMY     APPENDECTOMY     BACK SURGERY     x 3   CARDIAC CATHETERIZATION  04/13/2010   normal cororanies, EF 55% ([facility]; Dr. Clayborn Bigness)   CHOLECYSTECTOMY     COLONOSCOPY     COLONOSCOPY WITH PROPOFOL N/A 01/21/2017   Procedure: COLONOSCOPY WITH PROPOFOL;  Surgeon: Lollie Sails, MD;  Location: [facility] ENDOSCOPY;  Service: Endoscopy;  Laterality: N/A;   CORONARY ANGIOPLASTY     ESOPHAGOGASTRODUODENOSCOPY  06/12/2011   ESOPHAGOGASTRODUODENOSCOPY (EGD) WITH PROPOFOL N/A 01/21/2017   Procedure: ESOPHAGOGASTRODUODENOSCOPY (EGD) WITH PROPOFOL;  Surgeon: Lollie Sails, MD;  Location: [facility] ENDOSCOPY;  Service: Endoscopy;  Laterality: N/A;   FOOT SURGERY Left    d/t neuroma   OOPHORECTOMY     radiation to thyroid     RIGHT/LEFT HEART CATH AND CORONARY ANGIOGRAPHY Bilateral 10/22/2016   Procedure: RIGHT/LEFT HEART CATH AND CORONARY ANGIOGRAPHY;  Surgeon: Yolonda Kida, MD;  Location: [place] CV LAB;  Service: Cardiovascular;  Laterality: Bilateral;   SPINAL CORD STIMULATOR INSERTION N/A 07/16/2013   Procedure: LUMBAR SPINAL CORD STIMULATOR INSERTION;  Surgeon: Bonna Gains, MD;  Location: [place];  Service: Neurosurgery;  Laterality: N/A;   THORACIC DISCECTOMY Left 04/24/2018   Procedure: Left Thoracic ten- thoracic eleven Microdiscectomy, Removal of Spinal Cord Stimulator;  Surgeon: Kristeen Miss, MD;   Location: [place];  Service: Neurosurgery;  Laterality: Left;  Left Thoracic ten- thoracic eleven Microdiscectomy, Removal of Spinal Cord Stimulator    Family Psychiatric History: Please see initial evaluation for full details. I have reviewed the history. No updates at this time.     Family History:  Family History  Problem Relation Age of Onset   Anxiety disorder Mother    Breast cancer Neg Hx     Social History:  Social History   Socioeconomic History   Marital status: Married    Spouse name: Not on file   Number of children: 4   Years of education: Not on file   Highest education level: Not on file  Occupational History   Not on file  Tobacco Use   Smoking status: Former    Packs/day: 0.50    Years: 0.00    Pack years: 0.00    Types: Cigarettes    Quit date: 06/20/1986    Years since quitting: 34.7   Smokeless tobacco: Never  Vaping Use   Vaping Use: Never used  Substance and Sexual Activity   Alcohol use: No   Drug use: No   Sexual activity: Yes  Other Topics Concern   Not on file  Social History Narrative   Not on file   Social Determinants of Health   Financial Resource Strain: Not on file  Food Insecurity: Not on file  Transportation Needs: Not on file  Physical Activity: Not on file  Stress: Not on file  Social Connections: Not on file    Allergies: No Known Allergies  Metabolic Disorder Labs: No results found for: HGBA1C, MPG No results found for: PROLACTIN Lab Results  Component Value Date   CHOL 182 09/04/2012   TRIG 137 09/04/2012   HDL 63 (H) 09/04/2012   VLDL 27 09/04/2012   LDLCALC 92 09/04/2012   Lab Results  Component Value Date   TSH 3.872 10/18/2020   TSH 2.560 11/11/2018    Therapeutic Level Labs: No results found for: LITHIUM No results found for: VALPROATE No components found for:  CBMZ  Current Medications: Current Outpatient Medications  Medication Sig Dispense Refill   venlafaxine XR (EFFEXOR-XR) 37.5 MG 24 hr  capsule Take 1 capsule (37.5 mg total) by mouth daily with breakfast for 7 days. 7 capsule 0   [START ON 03/19/2021] venlafaxine XR (EFFEXOR-XR) 75 MG 24 hr capsule Take 1 capsule (75 mg total) by mouth daily with breakfast. Start after completing 37.5 mg daily for 7 days 30 capsule 0   acetaminophen (TYLENOL) 500 MG tablet Take 500 mg by mouth every 6 (six) hours as needed.     amLODipine (NORVASC) 2.5 MG tablet Take 2.5 mg by mouth daily.  azelastine (ASTELIN) 0.1 % nasal spray Place 1 spray into both nostrils as needed.     buprenorphine (BUTRANS) 5 MCG/HR PTWK Place 1 patch onto the skin once a week. 4 patch 1   clonazePAM (KLONOPIN) 0.5 MG tablet Take by mouth. (Patient not taking: Reported on 02/13/2021)     clonazePAM (KLONOPIN) 0.5 MG tablet Take 1 tablet (0.5 mg total) by mouth 2 (two) times daily as needed for anxiety. 60 tablet 2   esomeprazole (NEXIUM) 40 MG capsule Take 40 mg by mouth daily at 12 noon.     hydrOXYzine (ATARAX/VISTARIL) 25 MG tablet Take 25 mg by mouth at bedtime.     Hyoscyamine Sulfate SL 0.125 MG SUBL Place under the tongue.     levothyroxine (SYNTHROID, LEVOTHROID) 75 MCG tablet Take 75 mcg by mouth daily before breakfast.     lisinopril (PRINIVIL,ZESTRIL) 40 MG tablet Take 40 mg by mouth daily.     No current facility-administered medications for this visit.     Musculoskeletal: Strength & Muscle Tone:  N/A Gait & Station:  N/A Patient leans: N/A  Psychiatric Specialty Exam: Review of Systems  Psychiatric/Behavioral:  Positive for decreased concentration, dysphoric mood and sleep disturbance. Negative for agitation, behavioral problems, confusion, hallucinations, self-injury and suicidal ideas. The patient is nervous/anxious. The patient is not hyperactive.   All other systems reviewed and are negative.  Blood pressure 124/78, pulse 61, temperature (!) 96.6 F (35.9 C), temperature source Temporal, height 5\' 5"  (1.651 m), weight 222 lb 12.8 oz (101.1  kg).Body mass index is 37.08 kg/m.  General Appearance: Fairly Groomed  Eye Contact:  Good  Speech:  Clear and Coherent  Volume:  Normal  Mood:  Depressed  Affect:  Appropriate, Congruent, and down  Thought Process:  Coherent  Orientation:  Full (Time, Place, and Person)  Thought Content: Logical   Suicidal Thoughts:  No  Homicidal Thoughts:  No  Memory:  Immediate;   Good  Judgement:  Good  Insight:  Good  Psychomotor Activity:  Normal  Concentration:  Concentration: Good and Attention Span: Good  Recall:  Good  Fund of Knowledge: Good  Language: Good  Akathisia:  No  Handed:  Right  AIMS (if indicated): not done  Assets:  Communication Skills Desire for Improvement  ADL's:  Intact  Cognition: WNL  Sleep:  Poor   Screenings: GAD-7    Flowsheet Row Office Visit from 03/12/2021 in [place]  Total GAD-7 Score 12      PHQ2-9    [place] Visit from 03/12/2021 in [place] Office Visit from 02/13/2021 in [place] Office Visit from 01/22/2021 in [facility]  PHQ-2 Total Score 4 4 5   PHQ-9 Total Score 12 9 11       Flowsheet Row ED from 11/16/2020 in [facility] from 11/08/2020 in [facility] from 10/18/2020 in [facility] No Risk No Risk No Risk        Assessment and Plan:  Madison Jennings is a 81 y.o. year old female with a history of anxiety,  hypertension, hypothyroidism, fibromyalgia, Spinal stenosis of lumbar region without neurogenic claudication,, who presents for follow up appointment for below.     1. GAD (generalized anxiety disorder) 2. MDD (major depressive disorder), recurrent episode, moderate ([place]) She continues to report depressive symptoms and anxiety  since the last visit.  Psychosocial stressors includes pain.  She reports good support from her husband.  She could not tolerate sertraline due to adverse reaction, and has had adverse reaction of vivid dreams and increasing in appetite from mirtazapine as well.  We will start venlafaxine to target anxiety and depression.  Discussed potential risk of headache and hypertension.  Will continue clonazepam as needed for anxiety.  Reviewed the risk, which includes but not limited to respiratory suppression and drowsiness especially with concomitant use of opioid.  She is not interested in therapy at this time.   Plan Discontinue mirtazapine Start venlafaxine 37.5 mg daily for one week, then 75 mg daily Continue clonazepam 0.5 mg twice a day (was taking 0.5 mg TID) Next appointment- 2/20 at 2:30 for 30 mins, in person I have utilized the [place] Controlled Substances Reporting System (PMP AWARxE) to confirm adherence regarding the patient's medication. My review reveals appropriate prescription fills.    Past trials of medication: sertraline, bupropion (headache), duloxetine (limited benefit), mirtazapine (dream, increase in appetite), valium  The patient demonstrates the following risk factors for suicide: Chronic risk factors for suicide include: psychiatric disorder of anxiety and chronic pain. Acute risk factors for suicide include: unemployment. Protective factors for this patient include: positive social support, responsibility to others (children, family), coping skills, and hope for the future. Considering these factors, the overall suicide risk at this point appears to be low. Patient is appropriate for outpatient follow up.   Norman Clay, MD 03/12/2021, 2:55 PM

## 2021-03-12 ENCOUNTER — Ambulatory Visit (INDEPENDENT_AMBULATORY_CARE_PROVIDER_SITE_OTHER): Payer: Medicare Other | Admitting: Psychiatry

## 2021-03-12 ENCOUNTER — Encounter: Payer: Self-pay | Admitting: Psychiatry

## 2021-03-12 ENCOUNTER — Other Ambulatory Visit: Payer: Self-pay

## 2021-03-12 VITALS — BP 124/78 | HR 61 | Temp 96.6°F | Ht 65.0 in | Wt 222.8 lb

## 2021-03-12 DIAGNOSIS — F331 Major depressive disorder, recurrent, moderate: Secondary | ICD-10-CM | POA: Diagnosis not present

## 2021-03-12 DIAGNOSIS — F411 Generalized anxiety disorder: Secondary | ICD-10-CM

## 2021-03-12 MED ORDER — VENLAFAXINE HCL ER 37.5 MG PO CP24: 37.5000 mg | ORAL_CAPSULE | Freq: Every day | ORAL | 0 refills | Status: DC

## 2021-03-12 MED ORDER — VENLAFAXINE HCL ER 75 MG PO CP24: 75.0000 mg | ORAL_CAPSULE | Freq: Every day | ORAL | 0 refills | Status: DC

## 2021-03-12 MED ORDER — CLONAZEPAM 0.5 MG PO TABS: 0.5000 mg | ORAL_TABLET | Freq: Two times a day (BID) | ORAL | 2 refills | Status: DC | PRN

## 2021-03-12 NOTE — Patient Instructions (Addendum)
Discontinue mirtazapine Start venlafaxine 37.5 mg daily for one week, then 75 mg daily Continue clonazepam 0.5 mg twice a day  Next appointment- 2/20 at 2:30, in person

## 2021-03-13 ENCOUNTER — Telehealth: Payer: Self-pay

## 2021-03-13 NOTE — Telephone Encounter (Signed)
Medication management - Telephone call with pt, after she left a messge she did not understand why Dr. Modesta Messing only gave her a 7 day supply of Effexor XR 37.5 mg.  Reviewed Dr. Ivor Reining instructions from chart review 03/12/21 appt and patient stated she now understands she takes this dosage for 1 week and then goes up to Effexor XR 75 mg, one a day with breakfast then.  Patient stated understanding and verbalized instructions back to nurse.  Patient to call if any problems with starting and titration up to 75 mg of the Effexor XR.  Patient reported no problems with medication start of 37.5 mg this date.

## 2021-03-15 ENCOUNTER — Other Ambulatory Visit: Payer: Self-pay | Admitting: Psychiatry

## 2021-03-22 ENCOUNTER — Ambulatory Visit
Payer: Medicare Other | Attending: Student in an Organized Health Care Education/Training Program | Admitting: Student in an Organized Health Care Education/Training Program

## 2021-03-22 ENCOUNTER — Other Ambulatory Visit: Payer: Self-pay

## 2021-03-22 ENCOUNTER — Encounter: Payer: Self-pay | Admitting: Student in an Organized Health Care Education/Training Program

## 2021-03-22 VITALS — BP 150/61 | HR 51 | Temp 97.2°F | Ht 65.0 in | Wt 223.0 lb

## 2021-03-22 DIAGNOSIS — M5416 Radiculopathy, lumbar region: Secondary | ICD-10-CM | POA: Diagnosis not present

## 2021-03-22 DIAGNOSIS — M48062 Spinal stenosis, lumbar region with neurogenic claudication: Secondary | ICD-10-CM | POA: Diagnosis not present

## 2021-03-22 DIAGNOSIS — M961 Postlaminectomy syndrome, not elsewhere classified: Secondary | ICD-10-CM | POA: Diagnosis not present

## 2021-03-22 DIAGNOSIS — G8929 Other chronic pain: Secondary | ICD-10-CM | POA: Diagnosis present

## 2021-03-22 DIAGNOSIS — G894 Chronic pain syndrome: Secondary | ICD-10-CM | POA: Insufficient documentation

## 2021-03-22 DIAGNOSIS — Z981 Arthrodesis status: Secondary | ICD-10-CM | POA: Diagnosis present

## 2021-03-22 MED ORDER — PREGABALIN 25 MG PO CAPS
ORAL_CAPSULE | ORAL | 0 refills | Status: DC
Start: 2021-03-22 — End: 2021-03-22

## 2021-03-22 MED ORDER — PREGABALIN 25 MG PO CAPS: ORAL_CAPSULE | ORAL | 0 refills | Status: AC

## 2021-03-22 NOTE — Progress Notes (Signed)
PROVIDER NOTE: Information contained herein reflects review and annotations entered in association with encounter. Interpretation of such information and data should be left to medically-trained personnel. Information provided to patient can be located elsewhere in the medical record under "Patient Instructions". Document created using STT-dictation technology, any transcriptional errors that may result from process are unintentional.    Patient: Madison Jennings  Service Category: E/M  Provider: [name], MD  DOB: 12-Dec-1940  DOS: 03/22/2021  Specialty: Interventional Pain Management  MRN: [id number]  Setting: Ambulatory outpatient  PCP: [name], MD  Type: Established Patient    Referring Provider: [name], MD  Location: Office  Delivery: Face-to-face     HPI  Madison Jennings, a 81 y.o. year old female, is here today because of her History of lumbar fusion [Z98.1]. Ms. Hajjar primary complain today is Back Pain (Lumbar bilateral )  Last encounter: My last encounter with her was on 03/22/20  Pain Assessment: Severity of Chronic pain is reported as a 9 /10. Location: Back Lower, Left, Right/lumbar pain during the night radiates up towards the ribcage.. Onset: More than a month ago. Quality: Discomfort, Constant, Radiating, Aching. Timing: Constant. Modifying factor(s): patches caused N/V and skin irrritation.  hydrocodone does not provide adequate relief.. Vitals:  height is 5' 5"  (1.651 m) and weight is 223 lb (101.2 kg). Her temporal temperature is 97.2 F (36.2 C) (abnormal). Her blood pressure is 150/61 (abnormal) and her pulse is 51 (abnormal). Her oxygen saturation is 97%.   Reason for encounter: medication management.    Patient presents today for medication management.  She is having increased pain and anxiety.  Unfortunately, she has failed various medication trials in the including hydrocodone, B which resulted in side effects of nausea along with extended release  which was not effective.  This point recommend Lyrica as below.  Follow-up for injections as needed.     ROS  Constitutional: Denies any fever or chills Gastrointestinal: No reported hemesis, hematochezia, vomiting, or acute GI distress Musculoskeletal: Denies any acute onset joint swelling, redness, loss of ROM, or weakness Neurological: No reported episodes of acute onset apraxia, aphasia, dysarthria, agnosia, amnesia, paralysis, loss of coordination, or loss of consciousness  Medication Review  Hyoscyamine Sulfate SL, acetaminophen, amLODipine, clonazePAM, esomeprazole, levothyroxine, lisinopril, pregabalin, and venlafaxine XR  History Review  Allergy: Madison Jennings has No Known Allergies. Drug: Madison Jennings  reports no history of drug use. Alcohol:  reports no history of alcohol use. Tobacco:  reports that she quit smoking about 34 years ago. Her smoking use included cigarettes. She smoked an average of 0.50 packs per day. She has never used smokeless tobacco. Social: Madison Jennings  reports that she quit smoking about 34 years ago. Her smoking use included cigarettes. She smoked an average of 0.50 packs per day. She has never used smokeless tobacco. She reports that she does not drink alcohol and does not use drugs. Medical:  has a past medical history of Anxiety, Arthritis, Asthma, Chronic back pain, Chronic back pain, Depression, Diastolic dysfunction, Diverticulosis, Dysrhythmia, Fibromyalgia, Gastritis, GERD (gastroesophageal reflux disease), Headache(784.0), Hyperlipidemia, Hypertension, Hypothyroidism, Ischemic colitis ([place]), Joint pain, Nocturia, Obesity, and Vitamin D deficiency. Surgical: Madison Jennings  has a past surgical history that includes Abdominal hysterectomy; Cholecystectomy; Foot surgery (Left); radiation to thyroid; Colonoscopy; Cardiac catheterization (04/13/2010); Spinal cord stimulator insertion (N/A, 07/16/2013); RIGHT/LEFT HEART CATH AND CORONARY ANGIOGRAPHY (Bilateral, 10/22/2016);  Appendectomy; Coronary angioplasty; Esophagogastroduodenoscopy (06/12/2011); Back surgery; Colonoscopy with propofol (N/A, 01/21/2017); Esophagogastroduodenoscopy (egd) with propofol (  N/A, 01/21/2017); Oophorectomy; and Thoracic discectomy (Left, 04/24/2018). Family: family history includes Anxiety disorder in her mother.  Laboratory Chemistry Profile   Renal Lab Results  Component Value Date   BUN 11 11/08/2020   CREATININE 1.24 (H) 11/08/2020   GFRAA 60 (L) 11/11/2018   GFRNONAA 45 (L) 11/08/2020    Hepatic Lab Results  Component Value Date   AST 31 11/08/2020   ALT 17 11/08/2020   ALBUMIN 4.1 11/08/2020   ALKPHOS 87 11/08/2020   LIPASE 26 11/08/2020    Electrolytes Lab Results  Component Value Date   NA 137 11/08/2020   K 4.2 11/08/2020   CL 106 11/08/2020   CALCIUM 9.3 11/08/2020   MG 1.9 09/12/2012    Bone No results found for: VD25OH, VD125OH2TOT, [id number], YT0160FU9, 25OHVITD1, 25OHVITD2, 25OHVITD3, TESTOFREE, TESTOSTERONE  Inflammation (CRP: Acute Phase) (ESR: Chronic Phase) Lab Results  Component Value Date   LATICACIDVEN 2.7 ([place]) 11/08/2020         Note: Above Lab results reviewed.  Recent Imaging Review  MM 3D SCREEN BREAST BILATERAL CLINICAL DATA:  Screening.  EXAM: DIGITAL SCREENING BILATERAL MAMMOGRAM WITH TOMOSYNTHESIS AND CAD  TECHNIQUE: Bilateral screening digital craniocaudal and mediolateral oblique mammograms were obtained. Bilateral screening digital breast tomosynthesis was performed. The images were evaluated with computer-aided detection.  COMPARISON:  Previous exam(s).  ACR Breast Density Category b: There are scattered areas of fibroglandular density.  FINDINGS: There are no findings suspicious for malignancy.  IMPRESSION: No mammographic evidence of malignancy. A result letter of this screening mammogram will be mailed directly to the patient.  RECOMMENDATION: Screening mammogram in one year. (Code:SM-B-01Y)  BI-RADS  CATEGORY  1: Negative.  Electronically Signed   By: Lillia Mountain M.D.   On: 01/08/2021 16:25  Note: Reviewed        Physical Exam  General appearance: Well nourished, well developed, and well hydrated. In no apparent acute distress Mental status: Alert, oriented x 3 (person, place, & time)       Respiratory: No evidence of acute respiratory distress Eyes: PERLA Vitals: BP (!) 150/61    Pulse (!) 51    Temp (!) 97.2 F (36.2 C) (Temporal)    Ht 5' 5"  (1.651 m)    Wt 223 lb (101.2 kg)    SpO2 97%    BMI 37.11 kg/m  BMI: Estimated body mass index is 37.11 kg/m as calculated from the following:   Height as of this encounter: 5' 5"  (1.651 m).   Weight as of this encounter: 223 lb (101.2 kg). Ideal: Ideal body weight: 57 kg (125 lb 10.6 oz) Adjusted ideal body weight: 74.7 kg (164 lb 9.6 oz)    Thoracic Spine Area Exam  Skin & Axial Inspection: Well healed scar from previous spine surgery detected Alignment: Symmetrical Functional ROM: Pain restricted ROM Stability: No instability detected Muscle Tone/Strength: Functionally intact. No obvious neuro-muscular anomalies detected. Sensory (Neurological): Dermatomal pain pattern Muscle strength & Tone: No palpable anomalies  Lumbar Spine Area Exam  Skin & Axial Inspection: Well healed scar from previous spine surgery detected Alignment: Symmetrical Functional ROM: Mechanically restricted ROM       Stability: No instability detected Muscle Tone/Strength: Functionally intact. No obvious neuro-muscular anomalies detected. Sensory (Neurological): Dermatomal pain pattern   Gait & Posture Assessment  Ambulation: Limited Gait: Antalgic gait (limping) Posture: Difficulty standing up straight, due to pain   Lower Extremity Exam      Side: Right lower extremity   Side: Left lower extremity  Stability:  No instability observed           Stability: No instability observed          Skin & Extremity Inspection: Skin color, temperature, and hair  growth are WNL. No peripheral edema or cyanosis. No masses, redness, swelling, asymmetry, or associated skin lesions. No contractures.   Skin & Extremity Inspection: Skin color, temperature, and hair growth are WNL. No peripheral edema or cyanosis. No masses, redness, swelling, asymmetry, or associated skin lesions. No contractures.  Functional ROM: Pain restricted ROM                   Functional ROM: Pain restricted ROM                  Muscle Tone/Strength: Functionally intact. No obvious neuro-muscular anomalies detected.   Muscle Tone/Strength: Functionally intact. No obvious neuro-muscular anomalies detected.  Sensory (Neurological): Dermatomal pain pattern         Sensory (Neurological): Dermatomal pain pattern        DTR: Patellar: 0: absent Achilles: deferred today Plantar: deferred today   DTR: Patellar: 0: absent Achilles: deferred today Plantar: deferred today  Palpation: No palpable anomalies   Palpation: No palpable anomalies     Assessment   Status Diagnosis  Persistent Persistent Persistent 1. History of lumbar fusion   2. Failed back surgical syndrome   3. Spinal stenosis, lumbar region, with neurogenic claudication   4. Chronic radicular lumbar pain   5. Chronic pain syndrome       Plan of Care    Ms. JOHNELLA CRUMM has a current medication list which includes the following long-term medication(s): amlodipine, clonazepam, hyoscyamine sulfate sl, levothyroxine, venlafaxine xr, and venlafaxine xr.  Pharmacotherapy (Medications Ordered): Meds ordered this encounter  Medications   DISCONTD: pregabalin (LYRICA) 25 MG capsule    Sig: Take 1 capsule (25 mg total) by mouth 3 (three) times daily for 15 days, THEN 2 capsules (50 mg total) 3 (three) times daily for 15 days.    Dispense:  135 capsule    Refill:  0   pregabalin (LYRICA) 25 MG capsule    Sig: Take 1 capsule (25 mg total) by mouth at bedtime for 15 days, THEN 2 capsules (50 mg total) at bedtime.     Dispense:  75 capsule    Refill:  0     Follow-up plan:   Return for PRN.    Recent Visits Date Type Provider Dept  02/13/21 Office Visit [name], MD [facility]  12/28/20 Office Visit [name], MD [facility]  Showing recent visits within past 90 days and meeting all other requirements Today's Visits Date Type Provider Dept  03/22/21 Office Visit [name], MD [facility]  Showing today's visits and meeting all other requirements Future Appointments No visits were found meeting these conditions. Showing future appointments within next 90 days and meeting all other requirements  I discussed the assessment and treatment plan with the patient. The patient was provided an opportunity to ask questions and all were answered. The patient agreed with the plan and demonstrated an understanding of the instructions.  Patient advised to call back or seek an in-person evaluation if the symptoms or condition worsens.  Duration of encounter: 21mnutes.  Note by: BGillis Santa MD Date: 03/22/2021; Time: 8:30 AM

## 2021-03-22 NOTE — Progress Notes (Signed)
Nursing Pain Medication Assessment:  Safety precautions to be maintained throughout the outpatient stay will include: orient to surroundings, keep bed in low position, maintain call bell within reach at all times, provide assistance with transfer out of bed and ambulation.  Medication Inspection Compliance: Pill count conducted under aseptic conditions, in front of the patient. Neither the pills nor the bottle was removed from the patient's sight at any time. Once count was completed pills were immediately returned to the patient in their original bottle.  Medication: Buprenorphine (Suboxone) Pill/Patch Count:  3 of 4 patches remain Pill/Patch Appearance: Markings consistent with prescribed medication Bottle Appearance: Standard pharmacy container. Clearly labeled. Filled Date: 16 / 23 / 2022 Last Medication intake:   applied first patch the day she filled.  Nausea through the night and when she removed skin irritation.

## 2021-03-28 ENCOUNTER — Telehealth: Payer: Self-pay

## 2021-03-28 NOTE — Telephone Encounter (Signed)
pt left message that medication not going to work

## 2021-03-28 NOTE — Telephone Encounter (Signed)
left a message for pt to call back needed more info

## 2021-03-28 NOTE — Telephone Encounter (Signed)
pt called left message that she having twitching in her leg she thanks it the effexor she like for you to call her

## 2021-03-28 NOTE — Telephone Encounter (Addendum)
Called the patient. She states that she is not feeling well, which she attributes to chronic pain. She was told by her pain provider that she cannot be on pain medication as she is on clonazepam. (According to the chart review, Lyrica was started by this provider.) She does not want to taper down clonazepam as it makes her feel worse. She denies SI. She has not noticed any side effect from venlafaxine. She verbalized understanding to stay on the current medication regimen with the ope that venlafaxine becomes more effective for her mood.

## 2021-04-05 ENCOUNTER — Encounter: Payer: Medicare HMO | Admitting: Student in an Organized Health Care Education/Training Program

## 2021-04-09 ENCOUNTER — Other Ambulatory Visit: Payer: Self-pay | Admitting: Psychiatry

## 2021-04-09 DIAGNOSIS — F331 Major depressive disorder, recurrent, moderate: Secondary | ICD-10-CM

## 2021-04-09 DIAGNOSIS — F411 Generalized anxiety disorder: Secondary | ICD-10-CM

## 2021-04-09 NOTE — Telephone Encounter (Signed)
Dr.Hisada's patient

## 2021-04-12 ENCOUNTER — Other Ambulatory Visit: Payer: Self-pay | Admitting: Internal Medicine

## 2021-04-12 ENCOUNTER — Other Ambulatory Visit: Payer: Self-pay | Admitting: Psychiatry

## 2021-04-12 DIAGNOSIS — R221 Localized swelling, mass and lump, neck: Secondary | ICD-10-CM

## 2021-04-12 NOTE — Progress Notes (Signed)
BH MD/PA/NP OP Progress Note  04/16/2021 3:08 PM Madison Jennings  MRN:  [id number]  Chief Complaint:  Chief Complaint  Patient presents with   Follow-up   HPI:  This is a follow-up appointment for depression and anxiety.  She states that she has been feeling anxious.  She was having shortness of breath, and she felt her heart were to quit before coming to the appointment (Sat 96%, HR 84).  She cannot think of any triggers.  She has not been able to enjoy things, and reports depressive symptoms as in PHQ-9.  She has been taking clonazepam once a day.  She believes she was doing better when she used to be taking twice a day.  She was prescribed mirtazapine 7.5 mg by her PCP to target her sleep.  Although she previously reported vivid dreams and increasing in appetite, she has not noticed much this time.  She feels comfortable to stay at the current dose at this time.  She has not noticed any side effect from venlafaxine, and is willing to try higher dose.    Wt Readings from Last 3 Encounters:  04/16/21 224 lb 6.4 oz (101.8 kg)  03/22/21 223 lb (101.2 kg)  03/12/21 222 lb 12.8 oz (101.1 kg)     Visit Diagnosis:    ICD-10-CM   1. GAD (generalized anxiety disorder)  F41.1     2. MDD (major depressive disorder), recurrent episode, moderate ([place])  F33.1       Past Psychiatric History: Please see initial evaluation for full details. I have reviewed the history. No updates at this time.     Past Medical History:  Past Medical History:  Diagnosis Date   Anxiety    Arthritis    Asthma    Chronic back pain    Chronic back pain    scoliosis and 2 buldging disc   Depression    Diastolic dysfunction    Diverticulosis    Dysrhythmia    PALPITATIONS   Fibromyalgia    Gastritis    GERD (gastroesophageal reflux disease)    takes Protonix daily   Headache(784.0)    sinus related   Hyperlipidemia    just diagnosed;will follow up in 6wks to discuss meds   Hypertension    takes  Lisinopril daily   Hypothyroidism    takes Synthroid daily   Ischemic colitis ([place])    Joint pain    Nocturia    Obesity    Vitamin D deficiency    just placed on Vit D this week    Past Surgical History:  Procedure Laterality Date   ABDOMINAL HYSTERECTOMY     APPENDECTOMY     BACK SURGERY     x 3   CARDIAC CATHETERIZATION  04/13/2010   normal cororanies, EF 55% ([facility]; Dr. Clayborn Bigness)   CHOLECYSTECTOMY     COLONOSCOPY     COLONOSCOPY WITH PROPOFOL N/A 01/21/2017   Procedure: COLONOSCOPY WITH PROPOFOL;  Surgeon: Lollie Sails, MD;  Location: [facility] ENDOSCOPY;  Service: Endoscopy;  Laterality: N/A;   CORONARY ANGIOPLASTY     ESOPHAGOGASTRODUODENOSCOPY  06/12/2011   ESOPHAGOGASTRODUODENOSCOPY (EGD) WITH PROPOFOL N/A 01/21/2017   Procedure: ESOPHAGOGASTRODUODENOSCOPY (EGD) WITH PROPOFOL;  Surgeon: Lollie Sails, MD;  Location: [facility] ENDOSCOPY;  Service: Endoscopy;  Laterality: N/A;   FOOT SURGERY Left    d/t neuroma   OOPHORECTOMY     radiation to thyroid     RIGHT/LEFT HEART CATH AND CORONARY ANGIOGRAPHY Bilateral 10/22/2016   Procedure: RIGHT/LEFT  HEART CATH AND CORONARY ANGIOGRAPHY;  Surgeon: Yolonda Kida, MD;  Location: [place] [facility];  Service: Cardiovascular;  Laterality: Bilateral;   SPINAL CORD STIMULATOR INSERTION N/A 07/16/2013   Procedure: LUMBAR SPINAL CORD STIMULATOR INSERTION;  Surgeon: Bonna Gains, MD;  Location: [place];  Service: Neurosurgery;  Laterality: N/A;   THORACIC DISCECTOMY Left 04/24/2018   Procedure: Left Thoracic ten- thoracic eleven Microdiscectomy, Removal of Spinal Cord Stimulator;  Surgeon: Kristeen Miss, MD;  Location: [place];  Service: Neurosurgery;  Laterality: Left;  Left Thoracic ten- thoracic eleven Microdiscectomy, Removal of Spinal Cord Stimulator    Family Psychiatric History: Please see initial evaluation for full details. I have reviewed the history. No updates at this time.     Family History:  Family History  Problem  Relation Age of Onset   Anxiety disorder Mother    Breast cancer Neg Hx     Social History:  Social History   Socioeconomic History   Marital status: Married    Spouse name: Not on file   Number of children: 4   Years of education: Not on file   Highest education level: Not on file  Occupational History   Not on file  Tobacco Use   Smoking status: Former    Packs/day: 0.50    Years: 0.00    Pack years: 0.00    Types: Cigarettes    Quit date: 06/20/1986    Years since quitting: 34.8   Smokeless tobacco: Never  Vaping Use   Vaping Use: Never used  Substance and Sexual Activity   Alcohol use: No   Drug use: No   Sexual activity: Yes  Other Topics Concern   Not on file  Social History Narrative   Not on file   Social Determinants of Health   Financial Resource Strain: Not on file  Food Insecurity: Not on file  Transportation Needs: Not on file  Physical Activity: Not on file  Stress: Not on file  Social Connections: Not on file    Allergies: No Known Allergies  Metabolic Disorder Labs: No results found for: HGBA1C, MPG No results found for: PROLACTIN Lab Results  Component Value Date   CHOL 182 09/04/2012   TRIG 137 09/04/2012   HDL 63 (H) 09/04/2012   VLDL 27 09/04/2012   LDLCALC 92 09/04/2012   Lab Results  Component Value Date   TSH 3.872 10/18/2020   TSH 2.560 11/11/2018    Therapeutic Level Labs: No results found for: LITHIUM No results found for: VALPROATE No components found for:  CBMZ  Current Medications: Current Outpatient Medications  Medication Sig Dispense Refill   acetaminophen (TYLENOL) 500 MG tablet Take 500 mg by mouth every 6 (six) hours as needed.     amLODipine (NORVASC) 2.5 MG tablet Take 2.5 mg by mouth daily.     clonazePAM (KLONOPIN) 0.5 MG tablet Take 1 tablet (0.5 mg total) by mouth 2 (two) times daily as needed for anxiety. 60 tablet 2   esomeprazole (NEXIUM) 40 MG capsule Take 40 mg by mouth daily at 12 noon.      Hyoscyamine Sulfate SL 0.125 MG SUBL Place under the tongue.     levothyroxine (SYNTHROID, LEVOTHROID) 75 MCG tablet Take 75 mcg by mouth daily before breakfast.     lisinopril (PRINIVIL,ZESTRIL) 40 MG tablet Take 40 mg by mouth daily.     [START ON 05/10/2021] mirtazapine (REMERON) 7.5 MG tablet Take 1 tablet (7.5 mg total) by mouth at bedtime. 30 tablet  0   [START ON 04/17/2021] venlafaxine XR (EFFEXOR-XR) 150 MG 24 hr capsule Take 1 capsule (150 mg total) by mouth daily with breakfast. 30 capsule 0   pregabalin (LYRICA) 25 MG capsule Take 1 capsule (25 mg total) by mouth at bedtime for 15 days, THEN 2 capsules (50 mg total) at bedtime. (Patient not taking: Reported on 04/16/2021) 75 capsule 0   No current facility-administered medications for this visit.     Musculoskeletal: Strength & Muscle Tone: within normal limits Gait & Station: normal Patient leans: N/A  Psychiatric Specialty Exam: Review of Systems  Psychiatric/Behavioral:  Positive for decreased concentration, dysphoric mood and sleep disturbance. Negative for agitation, behavioral problems, confusion, hallucinations, self-injury and suicidal ideas. The patient is nervous/anxious. The patient is not hyperactive.   All other systems reviewed and are negative.  Blood pressure (!) 143/83, pulse 84, temperature 97.8 F (36.6 C), temperature source Temporal, weight 224 lb 6.4 oz (101.8 kg), SpO2 96 %.Body mass index is 37.34 kg/m.  General Appearance: Fairly Groomed  Eye Contact:  Good  Speech:  Clear and Coherent  Volume:  Normal  Mood:  Anxious  Affect:  Appropriate, Congruent, and anxious  Thought Process:  Coherent  Orientation:  Full (Time, Place, and Person)  Thought Content: Logical   Suicidal Thoughts:  No  Homicidal Thoughts:  No  Memory:  Immediate;   Good  Judgement:  Good  Insight:  Good  Psychomotor Activity:  Normal  Concentration:  Concentration: Good and Attention Span: Good  Recall:  Good  Fund of  Knowledge: Good  Language: Good  Akathisia:  No  Handed:  Right  AIMS (if indicated): not done  Assets:  Communication Skills Desire for Improvement  ADL's:  Intact  Cognition: WNL  Sleep:  Poor   Screenings: GAD-7    Flowsheet Row Office Visit from 03/12/2021 in [place]  Total GAD-7 Score 12      PHQ2-9    [place] Visit from 04/16/2021 in [place] Office Visit from 03/12/2021 in [place] Office Visit from 02/13/2021 in [place] Office Visit from 01/22/2021 in [place]  PHQ-2 Total Score 5 4 4 5   PHQ-9 Total Score 16 12 9 11       Flowsheet Row ED from 11/16/2020 in [place] [facility] from 11/08/2020 in [place] [facility] from 10/18/2020 in [place] No Risk No Risk No Risk        Assessment and Plan:  NALIAH EDDINGTON is a 82 y.o. year old female with a history of anxiety,  hypertension, hypothyroidism, fibromyalgia, Spinal stenosis of lumbar region without neurogenic claudication, who presents for follow up appointment for below.    1. GAD (generalized anxiety disorder) 2. MDD (major depressive disorder), recurrent episode, moderate ([place]) She reports depressive symptoms and anxiety, and having panic attacks since the last visit which coincided with taking clonazepam less frequently since her last visit.  Psychosocial stressors includes pain.  Will uptitrate venlafaxine to target anxiety and depression.  Discussed potential risk of headache and hypertension.  Noted that she has been started on mirtazapine again by her PCP, and now denies any noticeable side effect.  Will continue current dose as adjunctive treatment for depression, anxiety and also to target  insomnia.  She was advised that she can take clonazepam up to twice  a day while monitoring for any drowsiness/confusion.  She is not interested in therapy at this time.  Of note, she was recommended to go to the ED if  she experiences any significant physical symptoms to rule out any medical cause.   Plan Increase venlafaxine 150 mg daily Continue mirtazapine 7.5 mg at night Continue clonazepam 0.5 mg twice a day Next appointment: 3/20 at 3:30 for 30 mins, in person    Past trials of medication: sertraline, bupropion (headache), duloxetine (limited benefit), mirtazapine (dream, increase in appetite), gabapentin (increased in appetite), valium   The patient demonstrates the following risk factors for suicide: Chronic risk factors for suicide include: psychiatric disorder of anxiety and chronic pain. Acute risk factors for suicide include: unemployment. Protective factors for this patient include: positive social support, responsibility to others (children, family), coping skills, and hope for the future. Considering these factors, the overall suicide risk at this point appears to be low. Patient is appropriate for outpatient follow up.     Collaboration of Care: Collaboration of Care: Other N/A  Consent: Patient/Guardian gives verbal consent for treatment and assignment of benefits for services provided during this visit. Patient/Guardian expressed understanding and agreed to proceed.   I have utilized the [place] Controlled Substances Reporting System (PMP AWARxE) to confirm adherence regarding the patient's medication. My review reveals appropriate prescription fills.     Norman Clay, MD 04/16/2021, 3:08 PM

## 2021-04-16 ENCOUNTER — Other Ambulatory Visit: Payer: Self-pay

## 2021-04-16 ENCOUNTER — Encounter: Payer: Self-pay | Admitting: Psychiatry

## 2021-04-16 ENCOUNTER — Ambulatory Visit (INDEPENDENT_AMBULATORY_CARE_PROVIDER_SITE_OTHER): Payer: Medicare Other | Admitting: Psychiatry

## 2021-04-16 VITALS — BP 143/83 | HR 84 | Temp 97.8°F | Wt 224.4 lb

## 2021-04-16 DIAGNOSIS — F411 Generalized anxiety disorder: Secondary | ICD-10-CM

## 2021-04-16 DIAGNOSIS — F331 Major depressive disorder, recurrent, moderate: Secondary | ICD-10-CM

## 2021-04-16 MED ORDER — VENLAFAXINE HCL ER 150 MG PO CP24: 150.0000 mg | ORAL_CAPSULE | Freq: Every day | ORAL | 0 refills | Status: DC

## 2021-04-16 MED ORDER — MIRTAZAPINE 7.5 MG PO TABS: 7.5000 mg | ORAL_TABLET | Freq: Every day | ORAL | 0 refills | Status: DC

## 2021-04-26 ENCOUNTER — Ambulatory Visit
Admission: RE | Admit: 2021-04-26 | Discharge: 2021-04-26 | Disposition: A | Discharge status: Home / Self Care | Payer: Medicare Other | Source: Ambulatory Visit | Attending: Internal Medicine | Admitting: Internal Medicine

## 2021-04-26 ENCOUNTER — Other Ambulatory Visit: Payer: Self-pay

## 2021-04-26 DIAGNOSIS — R221 Localized swelling, mass and lump, neck: Secondary | ICD-10-CM | POA: Diagnosis present

## 2021-04-26 MED ORDER — IOHEXOL 300 MG/ML  SOLN
75.0000 mL | Freq: Once | INTRAMUSCULAR | Status: AC | PRN
  Administered 2021-04-26: 60 mL via INTRAVENOUS

## 2021-05-10 NOTE — Progress Notes (Signed)
BH MD/PA/NP OP Progress Note ? ?05/14/2021 4:05 PM ?Madison Jennings  ?MRN:  [id number] ? ?Chief Complaint:  ?Chief Complaint  ?Patient presents with  ? Follow-up  ? ?HPI:  ?This is a follow-up appointment for depression and anxiety.  ?She states that she continues to have anxiety.  She has panic attacks once a day.  She agrees that it has become less frequent since the last visit.  She has not been able to go to church yet due to her pain.  She has occasional irregular heartbeat.  She has not seen her cardiologist for a while. She agrees to contact her cardiologist.  She has insomnia.  She feels fatigued at times.  She denies change in her appetite.  She denies SI. She takes clonazepam once every day for anxiety. She also noticed occasional hand tremors. She agrees that it tends to happen when she tries to hold cups or other things.  She denies family history of tremors.  She discontinued mirtazapine a few weeks ago.  She states that she does not want to be on the medication which she could be dependent on.  Provided psychoeducation that mirtazapine is not habit-forming.  She is willing to stay on the current dose of venlafaxine at this time to see if it would be more helpful for depression/anxiety.  ? ?Wt Readings from Last 3 Encounters:  ?05/14/21 222 lb 9.6 oz (101 kg)  ?04/16/21 224 lb 6.4 oz (101.8 kg)  ?03/22/21 223 lb (101.2 kg)  ?  ? ?Visit Diagnosis:  ?  ICD-10-CM   ?1. GAD (generalized anxiety disorder)  F41.1   ?  ?2. MDD (major depressive disorder), recurrent episode, mild ([facility])  F33.0   ?  ? ? ?Past Psychiatric History: Please see initial evaluation for full details. I have reviewed the history. No updates at this time.  ?  ? ?Past Medical History:  ?Past Medical History:  ?Diagnosis Date  ? Anxiety   ? Arthritis   ? Asthma   ? Chronic back pain   ? Chronic back pain   ? scoliosis and 2 buldging disc  ? Depression   ? Diastolic dysfunction   ? Diverticulosis   ? Dysrhythmia   ? PALPITATIONS  ? Fibromyalgia    ? Gastritis   ? GERD (gastroesophageal reflux disease)   ? takes Protonix daily  ? Headache(784.0)   ? sinus related  ? Hyperlipidemia   ? just diagnosed;will follow up in 6wks to discuss meds  ? Hypertension   ? takes Lisinopril daily  ? Hypothyroidism   ? takes Synthroid daily  ? Ischemic colitis ([place])   ? Joint pain   ? Nocturia   ? Obesity   ? Vitamin D deficiency   ? just placed on Vit D this week  ?  ?Past Surgical History:  ?Procedure Laterality Date  ? ABDOMINAL HYSTERECTOMY    ? APPENDECTOMY    ? BACK SURGERY    ? x 3  ? CARDIAC CATHETERIZATION  04/13/2010  ? normal cororanies, EF 55% ([facility]; Dr. Clayborn Bigness)  ? CHOLECYSTECTOMY    ? COLONOSCOPY    ? COLONOSCOPY WITH PROPOFOL N/A 01/21/2017  ? Procedure: COLONOSCOPY WITH PROPOFOL;  Surgeon: Lollie Sails, MD;  Location: [facility] ENDOSCOPY;  Service: Endoscopy;  Laterality: N/A;  ? CORONARY ANGIOPLASTY    ? ESOPHAGOGASTRODUODENOSCOPY  06/12/2011  ? ESOPHAGOGASTRODUODENOSCOPY (EGD) WITH PROPOFOL N/A 01/21/2017  ? Procedure: ESOPHAGOGASTRODUODENOSCOPY (EGD) WITH PROPOFOL;  Surgeon: Lollie Sails, MD;  Location: [facility] ENDOSCOPY;  Service: Endoscopy;  Laterality: N/A;  ? FOOT SURGERY Left   ? d/t neuroma  ? OOPHORECTOMY    ? radiation to thyroid    ? RIGHT/LEFT HEART CATH AND CORONARY ANGIOGRAPHY Bilateral 10/22/2016  ? Procedure: RIGHT/LEFT HEART CATH AND CORONARY ANGIOGRAPHY;  Surgeon: Yolonda Kida, MD;  Location: [place] [facility];  Service: Cardiovascular;  Laterality: Bilateral;  ? SPINAL CORD STIMULATOR INSERTION N/A 07/16/2013  ? Procedure: LUMBAR SPINAL CORD STIMULATOR INSERTION;  Surgeon: Bonna Gains, MD;  Location: [place];  Service: Neurosurgery;  Laterality: N/A;  ? THORACIC DISCECTOMY Left 04/24/2018  ? Procedure: Left Thoracic ten- thoracic eleven Microdiscectomy, Removal of Spinal Cord Stimulator;  Surgeon: Kristeen Miss, MD;  Location: [place];  Service: Neurosurgery;  Laterality: Left;  Left Thoracic ten- thoracic eleven Microdiscectomy,  Removal of Spinal Cord Stimulator  ? ? ?Family Psychiatric History: Please see initial evaluation for full details. I have reviewed the history. No updates at this time.  ? ? ?Family History:  ?Family History  ?Problem Relation Age of Onset  ? Anxiety disorder Mother   ? Breast cancer Neg Hx   ? ? ?Social History:  ?Social History  ? ?Socioeconomic History  ? Marital status: Married  ?  Spouse name: Not on file  ? Number of children: 4  ? Years of education: Not on file  ? Highest education level: Not on file  ?Occupational History  ? Not on file  ?Tobacco Use  ? Smoking status: Former  ?  Packs/day: 0.50  ?  Years: 0.00  ?  Pack years: 0.00  ?  Types: Cigarettes  ?  Quit date: 06/20/1986  ?  Years since quitting: 34.9  ? Smokeless tobacco: Never  ?Vaping Use  ? Vaping Use: Never used  ?Substance and Sexual Activity  ? Alcohol use: No  ? Drug use: No  ? Sexual activity: Yes  ?Other Topics Concern  ? Not on file  ?Social History Narrative  ? Not on file  ? ?Social Determinants of Health  ? ?Financial Resource Strain: Not on file  ?Food Insecurity: Not on file  ?Transportation Needs: Not on file  ?Physical Activity: Not on file  ?Stress: Not on file  ?Social Connections: Not on file  ? ? ?Allergies: No Known Allergies ? ?Metabolic Disorder Labs: ?No results found for: HGBA1C, MPG ?No results found for: PROLACTIN ?Lab Results  ?Component Value Date  ? CHOL 182 09/04/2012  ? TRIG 137 09/04/2012  ? HDL 63 (H) 09/04/2012  ? VLDL 27 09/04/2012  ? [place] 92 09/04/2012  ? ?Lab Results  ?Component Value Date  ? TSH 3.872 10/18/2020  ? TSH 2.560 11/11/2018  ? ? ?Therapeutic Level Labs: ?No results found for: LITHIUM ?No results found for: VALPROATE ?No components found for:  CBMZ ? ?Current Medications: ?Current Outpatient Medications  ?Medication Sig Dispense Refill  ? acetaminophen (TYLENOL) 500 MG tablet Take 500 mg by mouth every 6 (six) hours as needed.    ? amLODipine (NORVASC) 2.5 MG tablet Take 2.5 mg by mouth daily.     ? clonazePAM (KLONOPIN) 0.5 MG tablet Take 1 tablet (0.5 mg total) by mouth 2 (two) times daily as needed for anxiety. 60 tablet 2  ? esomeprazole (NEXIUM) 40 MG capsule Take 40 mg by mouth daily at 12 noon.    ? Hyoscyamine Sulfate SL 0.125 MG SUBL Place under the tongue.    ? levothyroxine (SYNTHROID, LEVOTHROID) 75 MCG tablet Take 75 mcg by mouth daily before  breakfast.    ? lisinopril (PRINIVIL,ZESTRIL) 40 MG tablet Take 40 mg by mouth daily.    ? [START ON 05/18/2021] venlafaxine XR (EFFEXOR-XR) 150 MG 24 hr capsule Take 1 capsule (150 mg total) by mouth daily with breakfast. 30 capsule 1  ? ?No current facility-administered medications for this visit.  ? ? ? ?Musculoskeletal: ?Strength & Muscle Tone: within normal limits ?Gait & Station: normal (uses a cane) ?Patient leans: N/A ? ?Psychiatric Specialty Exam: ?Review of Systems  ?Psychiatric/Behavioral:  Positive for decreased concentration, dysphoric mood and sleep disturbance. Negative for agitation, behavioral problems, confusion, hallucinations, self-injury and suicidal ideas. The patient is nervous/anxious. The patient is not hyperactive.   ?All other systems reviewed and are negative.  ?Blood pressure (!) 142/82, pulse 66, temperature 98.2 ?F (36.8 ?C), temperature source Temporal, weight 222 lb 9.6 oz (101 kg).Body mass index is 37.04 kg/m?.  ?General Appearance: Fairly Groomed  ?Eye Contact:  Good  ?Speech:  Clear and Coherent  ?Volume:  Normal  ?Mood:  Anxious  ?Affect:  Appropriate, Congruent, and calmer  ?Thought Process:  Coherent  ?Orientation:  Full (Time, Place, and Person)  ?Thought Content: Logical   ?Suicidal Thoughts:  No  ?Homicidal Thoughts:  No  ?Memory:  Immediate;   Good  ?Judgement:  Good  ?Insight:  Good  ?Psychomotor Activity:  Normal, postural tremors on bilateral hands  ?Concentration:  Concentration: Good and Attention Span: Good  ?Recall:  Good  ?Fund of Knowledge: Good  ?Language: Good  ?Akathisia:  No  ?Handed:  Right  ?AIMS (if  indicated): not done  ?Assets:  Communication Skills ?Desire for Improvement  ?ADL's:  Intact  ?Cognition: WNL  ?Sleep:  Poor  ? ?Screenings: ?GAD-7   ? ?[place] Office Visit from 03/12/2021 in

## 2021-05-11 ENCOUNTER — Other Ambulatory Visit: Payer: Self-pay | Admitting: Internal Medicine

## 2021-05-11 DIAGNOSIS — K3184 Gastroparesis: Secondary | ICD-10-CM

## 2021-05-12 ENCOUNTER — Other Ambulatory Visit: Payer: Self-pay | Admitting: Psychiatry

## 2021-05-14 ENCOUNTER — Other Ambulatory Visit: Payer: Self-pay

## 2021-05-14 ENCOUNTER — Encounter: Payer: Self-pay | Admitting: Psychiatry

## 2021-05-14 ENCOUNTER — Ambulatory Visit (INDEPENDENT_AMBULATORY_CARE_PROVIDER_SITE_OTHER): Payer: Medicare Other | Admitting: Psychiatry

## 2021-05-14 VITALS — BP 142/82 | HR 66 | Temp 98.2°F | Wt 222.6 lb

## 2021-05-14 DIAGNOSIS — F330 Major depressive disorder, recurrent, mild: Secondary | ICD-10-CM | POA: Diagnosis not present

## 2021-05-14 DIAGNOSIS — F411 Generalized anxiety disorder: Secondary | ICD-10-CM | POA: Diagnosis not present

## 2021-05-14 MED ORDER — VENLAFAXINE HCL ER 150 MG PO CP24: 150.0000 mg | ORAL_CAPSULE | Freq: Every day | ORAL | 1 refills | Status: DC

## 2021-05-14 NOTE — Patient Instructions (Signed)
Continue venlafaxine 150 mg daily ?Hold mirtazapine  ?Continue clonazepam 0.5 mg twice a day as needed for anxiety  ?Next appointment: 5/15 at 2:30 ?

## 2021-06-01 ENCOUNTER — Ambulatory Visit: Payer: Medicare Other

## 2021-06-14 ENCOUNTER — Telehealth: Payer: Self-pay | Admitting: Psychiatry

## 2021-06-14 MED ORDER — VENLAFAXINE HCL ER 75 MG PO CP24: 75.0000 mg | ORAL_CAPSULE | Freq: Every day | ORAL | 0 refills | Status: DC

## 2021-06-14 NOTE — Telephone Encounter (Signed)
I called pt and she states that she believes it is the effexor causing the issue.  I advised to go to the ER or go the [facility] in [place] and she declined  ?

## 2021-06-14 NOTE — Telephone Encounter (Signed)
Patient called requesting to be seen by provider earlier than next scheduled appointment 07/09/2021 due to issue with medication. States that when she takes Venlafaxine she starts to "jerk all over" and has to lay down. "It's tearing my nerves up." Provider is out of office. Referring call to covering provider and CMA. ?

## 2021-07-02 ENCOUNTER — Other Ambulatory Visit: Payer: Self-pay | Admitting: Psychiatry

## 2021-07-02 ENCOUNTER — Telehealth: Payer: Self-pay

## 2021-07-02 MED ORDER — FLUOXETINE HCL 10 MG PO CAPS: 10.0000 mg | ORAL_CAPSULE | Freq: Every day | ORAL | 0 refills | Status: DC

## 2021-07-02 NOTE — Telephone Encounter (Signed)
luo ?

## 2021-07-02 NOTE — Telephone Encounter (Signed)
Discussed with the patient.  She states that she had a panic attack with intense anxiety, hypertension, palpitation, shortness of breath. She contacted our office and venlafaxine was lowered to 75 mg daily by Dr. De Nurse. She was seen by cardiologist and pulmonologist. (According to the chart review, she was recommended for 72-hour holter. CXR, spirometry without significant findings). She continues to feel anxious, and she discontinued venlafaxine four days ago, and now struggling with discontinuation symptoms. She denies SI. Discussed the following.  Given her ambivalence of starting another medication, she was informed that the medication to be ordered if she is interested.  ?- Start fluoxetine 10 mg daily  ?

## 2021-07-02 NOTE — Telephone Encounter (Signed)
pt state she can not take the venlafaxine she states she needs to speak with you as soon as you can.Marland Kitchen   ?

## 2021-07-06 NOTE — Progress Notes (Addendum)
[facility] MD/PA/NP OP Progress Note ? ?07/09/2021 3:06 PM ?Madison Jennings  ?MRN:  [id number] ? ?Chief Complaint:  ?Chief Complaint  ?Patient presents with  ? Follow-up  ? ?HPI:  ?- since the last visit, the following phone visit was done.  ?"she states that she had a panic attack with intense anxiety, hypertension, palpitation, shortness of breath. She contacted our office and venlafaxine was lowered to 75 mg daily by Dr. De Nurse. She was seen by cardiologist and pulmonologist. (According to the chart review, she was recommended for 72-hour holter. CXR, spirometry without significant findings). She continues to feel anxious, and she discontinued venlafaxine four days ago, and now struggling with discontinuation symptoms. She denies SI. Discussed the following.  Given her ambivalence of starting another medication, she was informed that the medication to be ordered if she is interested.  ?- Start fluoxetine 10 mg daily" ? ?This is a follow-up appointment for depression and anxiety.  She states that she has not started fluoxetine.  She was concerned of a side effect after looking up the medication.  She feels sick in the stomach, feels anxious.  She has not been doing well for the past several months.  Although she wants to try something, she is scared of this.  She states that she was having headache, wheezing when she was on venlafaxine.  She thinks the loss has improved since discontinuation of venlafaxine.  She does not want to try any medication which can cause weight gain, or the medication which works similarly to venlafaxine.  She has depressive symptoms as in PHQ-9.  She has a weird dreams at night.  She denies SI.  She feels anxious sometimes.  She takes clonazepam up to twice a day , although she does not take this medication every day.  She has an upcoming appointment with her cardiologist and pulmonologist .  She agrees to discuss regarding her  "upset stomach" with her PCP. After discussing treatment options, she  is willing to try BuSpar at this time.  ? ?Wt Readings from Last 3 Encounters:  ?07/09/21 227 lb 3.2 oz (103.1 kg)  ?05/14/21 222 lb 9.6 oz (101 kg)  ?04/16/21 224 lb 6.4 oz (101.8 kg)  ?  ? ?Visit Diagnosis:  ?  ICD-10-CM   ?1. GAD (generalized anxiety disorder)  F41.1   ?  ?2. MDD (major depressive disorder), recurrent episode, mild (HCC)  F33.0   ?  ? ? ?Past Psychiatric History: Please see initial evaluation for full details. I have reviewed the history. No updates at this time.  ?  ? ?Past Medical History:  ?Past Medical History:  ?Diagnosis Date  ? Anxiety   ? Arthritis   ? Asthma   ? Chronic back pain   ? Chronic back pain   ? scoliosis and 2 buldging disc  ? Depression   ? Diastolic dysfunction   ? Diverticulosis   ? Dysrhythmia   ? PALPITATIONS  ? Fibromyalgia   ? Gastritis   ? GERD (gastroesophageal reflux disease)   ? takes Protonix daily  ? Headache(784.0)   ? sinus related  ? Hyperlipidemia   ? just diagnosed;will follow up in 6wks to discuss meds  ? Hypertension   ? takes Lisinopril daily  ? Hypothyroidism   ? takes Synthroid daily  ? Ischemic colitis ([place])   ? Joint pain   ? Nocturia   ? Obesity   ? Vitamin D deficiency   ? just placed on Vit D this week  ?  ?  Past Surgical History:  ?Procedure Laterality Date  ? ABDOMINAL HYSTERECTOMY    ? APPENDECTOMY    ? BACK SURGERY    ? x 3  ? CARDIAC CATHETERIZATION  04/13/2010  ? normal cororanies, EF 55% ([facility]; Dr. Clayborn Bigness)  ? CHOLECYSTECTOMY    ? COLONOSCOPY    ? COLONOSCOPY WITH PROPOFOL N/A 01/21/2017  ? Procedure: COLONOSCOPY WITH PROPOFOL;  Surgeon: Lollie Sails, MD;  Location: [facility] ENDOSCOPY;  Service: Endoscopy;  Laterality: N/A;  ? CORONARY ANGIOPLASTY    ? ESOPHAGOGASTRODUODENOSCOPY  06/12/2011  ? ESOPHAGOGASTRODUODENOSCOPY (EGD) WITH PROPOFOL N/A 01/21/2017  ? Procedure: ESOPHAGOGASTRODUODENOSCOPY (EGD) WITH PROPOFOL;  Surgeon: Lollie Sails, MD;  Location: [facility] ENDOSCOPY;  Service: Endoscopy;  Laterality: N/A;  ? FOOT SURGERY Left   ? d/t  neuroma  ? OOPHORECTOMY    ? radiation to thyroid    ? RIGHT/LEFT HEART CATH AND CORONARY ANGIOGRAPHY Bilateral 10/22/2016  ? Procedure: RIGHT/LEFT HEART CATH AND CORONARY ANGIOGRAPHY;  Surgeon: Yolonda Kida, MD;  Location: [place] CV LAB;  Service: Cardiovascular;  Laterality: Bilateral;  ? SPINAL CORD STIMULATOR INSERTION N/A 07/16/2013  ? Procedure: LUMBAR SPINAL CORD STIMULATOR INSERTION;  Surgeon: Bonna Gains, MD;  Location: [place];  Service: Neurosurgery;  Laterality: N/A;  ? THORACIC DISCECTOMY Left 04/24/2018  ? Procedure: Left Thoracic ten- thoracic eleven Microdiscectomy, Removal of Spinal Cord Stimulator;  Surgeon: Kristeen Miss, MD;  Location: [place];  Service: Neurosurgery;  Laterality: Left;  Left Thoracic ten- thoracic eleven Microdiscectomy, Removal of Spinal Cord Stimulator  ? ? ?Family Psychiatric History: Please see initial evaluation for full details. I have reviewed the history. No updates at this time.  ?  ? ?Family History:  ?Family History  ?Problem Relation Age of Onset  ? Anxiety disorder Mother   ? Breast cancer Neg Hx   ? ? ?Social History:  ?Social History  ? ?Socioeconomic History  ? Marital status: Married  ?  Spouse name: Not on file  ? Number of children: 4  ? Years of education: Not on file  ? Highest education level: Not on file  ?Occupational History  ? Not on file  ?Tobacco Use  ? Smoking status: Former  ?  Packs/day: 0.50  ?  Years: 0.00  ?  Pack years: 0.00  ?  Types: Cigarettes  ?  Quit date: 06/20/1986  ?  Years since quitting: 35.0  ? Smokeless tobacco: Never  ?Vaping Use  ? Vaping Use: Never used  ?Substance and Sexual Activity  ? Alcohol use: No  ? Drug use: No  ? Sexual activity: Yes  ?Other Topics Concern  ? Not on file  ?Social History Narrative  ? Not on file  ? ?Social Determinants of Health  ? ?Financial Resource Strain: Not on file  ?Food Insecurity: Not on file  ?Transportation Needs: Not on file  ?Physical Activity: Not on file  ?Stress: Not on file   ?Social Connections: Not on file  ? ? ?Allergies: No Known Allergies ? ?Metabolic Disorder Labs: ?No results found for: HGBA1C, MPG ?No results found for: PROLACTIN ?Lab Results  ?Component Value Date  ? CHOL 182 09/04/2012  ? TRIG 137 09/04/2012  ? HDL 63 (H) 09/04/2012  ? VLDL 27 09/04/2012  ? [place] 92 09/04/2012  ? ?Lab Results  ?Component Value Date  ? TSH 3.872 10/18/2020  ? TSH 2.560 11/11/2018  ? ? ?Therapeutic Level Labs: ?No results found for: LITHIUM ?No results found for: VALPROATE ?No components found for:  CBMZ ? ?Current Medications: ?Current Outpatient Medications  ?Medication Sig Dispense Refill  ? acetaminophen (TYLENOL) 500 MG tablet Take 500 mg by mouth every 6 (six) hours as needed.    ? albuterol (VENTOLIN HFA) 108 (90 Base) MCG/ACT inhaler Inhale into the lungs.    ? amLODipine (NORVASC) 2.5 MG tablet Take 2.5 mg by mouth daily.    ? busPIRone (BUSPAR) 5 MG tablet Take 1 tablet (5 mg total) by mouth 2 (two) times daily. 60 tablet 0  ? dicyclomine (BENTYL) 10 MG capsule Take by mouth.    ? esomeprazole (NEXIUM) 40 MG capsule Take 40 mg by mouth daily at 12 noon.    ? Hyoscyamine Sulfate SL 0.125 MG SUBL Place under the tongue.    ? levothyroxine (SYNTHROID, LEVOTHROID) 75 MCG tablet Take 75 mcg by mouth daily before breakfast.    ? lisinopril (PRINIVIL,ZESTRIL) 40 MG tablet Take 40 mg by mouth daily.    ? lisinopril (ZESTRIL) 40 MG tablet Take 1 tablet by mouth daily.    ? pantoprazole (PROTONIX) 40 MG tablet Take by mouth.    ? clonazePAM (KLONOPIN) 0.5 MG tablet Take 1 tablet (0.5 mg total) by mouth 2 (two) times daily as needed for anxiety. 60 tablet 2  ? ?No current facility-administered medications for this visit.  ? ? ? ?Musculoskeletal: ?Strength & Muscle Tone: within normal limits ?Gait & Station: normal ?Patient leans: N/A ? ?Psychiatric Specialty Exam: ?Review of Systems  ?Psychiatric/Behavioral:  Positive for decreased concentration, dysphoric mood and sleep disturbance. Negative  for agitation, behavioral problems, confusion, hallucinations, self-injury and suicidal ideas. The patient is nervous/anxious. The patient is not hyperactive.   ?All other systems reviewed and are negative.

## 2021-07-09 ENCOUNTER — Ambulatory Visit (INDEPENDENT_AMBULATORY_CARE_PROVIDER_SITE_OTHER): Payer: Medicare Other | Admitting: Psychiatry

## 2021-07-09 ENCOUNTER — Encounter: Payer: Self-pay | Admitting: Psychiatry

## 2021-07-09 VITALS — BP 132/80 | HR 60 | Temp 98.3°F | Wt 227.2 lb

## 2021-07-09 DIAGNOSIS — F411 Generalized anxiety disorder: Secondary | ICD-10-CM | POA: Diagnosis not present

## 2021-07-09 DIAGNOSIS — F330 Major depressive disorder, recurrent, mild: Secondary | ICD-10-CM | POA: Diagnosis not present

## 2021-07-09 MED ORDER — CLONAZEPAM 0.5 MG PO TABS: 0.5000 mg | ORAL_TABLET | Freq: Two times a day (BID) | ORAL | 1 refills | Status: DC | PRN

## 2021-07-09 MED ORDER — BUSPIRONE HCL 5 MG PO TABS: 5.0000 mg | ORAL_TABLET | Freq: Two times a day (BID) | ORAL | 0 refills | Status: DC

## 2021-07-10 ENCOUNTER — Other Ambulatory Visit: Payer: Self-pay | Admitting: Psychiatry

## 2021-07-27 ENCOUNTER — Other Ambulatory Visit: Payer: Self-pay | Admitting: Gastroenterology

## 2021-07-27 DIAGNOSIS — G8929 Other chronic pain: Secondary | ICD-10-CM

## 2021-07-27 DIAGNOSIS — K219 Gastro-esophageal reflux disease without esophagitis: Secondary | ICD-10-CM

## 2021-08-02 ENCOUNTER — Ambulatory Visit (INDEPENDENT_AMBULATORY_CARE_PROVIDER_SITE_OTHER): Payer: Medicare Other | Admitting: Psychiatry

## 2021-08-02 ENCOUNTER — Encounter: Payer: Self-pay | Admitting: Psychiatry

## 2021-08-02 VITALS — BP 123/73 | HR 60 | Temp 97.9°F | Wt 224.4 lb

## 2021-08-02 DIAGNOSIS — F411 Generalized anxiety disorder: Secondary | ICD-10-CM | POA: Diagnosis not present

## 2021-08-02 DIAGNOSIS — F331 Major depressive disorder, recurrent, moderate: Secondary | ICD-10-CM | POA: Diagnosis not present

## 2021-08-02 MED ORDER — BUSPIRONE HCL 5 MG PO TABS: 5.0000 mg | ORAL_TABLET | Freq: Two times a day (BID) | ORAL | 0 refills | Status: DC

## 2021-08-02 NOTE — Progress Notes (Signed)
[place] [facility] MD/PA/NP OP Progress Note  08/02/2021 11:37 AM Madison Jennings  MRN:  [id number]  Chief Complaint:  Chief Complaint  Patient presents with   Follow-up   HPI:  According to the chart review, she was seen by her pulmonologist. Dx includes Reactive airways. Albuterol and Qvar was added She was seen by her cardiologist; Dx includes Paroxysmal atrial fibrillation, bradycardia. Eliquis was started She was seen by gastroenterologist; diagnosis includes GERD.  She is scheduled for endoscopy, and she may had abdominal CT for abdominal pain.  This is a follow-up appointment for depression and anxiety.  She states that she continues to have back pain.  She has been started on medication by her pulmonologist and cardiologist.  She discontinued the BuSpar as she felt smothering, which was the similar feeling she had when she was on venlafaxine.  She occasionally feels this way despite being off BuSpar.  She also wonders if she might be having side effect from clonazepam.  She occasionally takes up to twice a day for anxiety, which has been helpful.  She would like to try anything which would help her.  She has depressive symptoms as in PHQ-9.  She denies SI.  She is willing to try BuSpar at this time.     Wt Readings from Last 3 Encounters:  08/02/21 224 lb 6.4 oz (101.8 kg)  07/09/21 227 lb 3.2 oz (103.1 kg)  05/14/21 222 lb 9.6 oz (101 kg)     Visit Diagnosis:    ICD-10-CM   1. GAD (generalized anxiety disorder)  F41.1     2. MDD (major depressive disorder), recurrent episode, moderate ([place])  F33.1       Past Psychiatric History: Please see initial evaluation for full details. I have reviewed the history. No updates at this time.     Past Medical History:  Past Medical History:  Diagnosis Date   Anxiety    Arthritis    Asthma    Chronic back pain    Chronic back pain    scoliosis and 2 buldging disc   Depression    Diastolic dysfunction    Diverticulosis    Dysrhythmia     PALPITATIONS   Fibromyalgia    Gastritis    GERD (gastroesophageal reflux disease)    takes Protonix daily   Headache(784.0)    sinus related   Hyperlipidemia    just diagnosed;will follow up in 6wks to discuss meds   Hypertension    takes Lisinopril daily   Hypothyroidism    takes Synthroid daily   Ischemic colitis ([place])    Joint pain    Nocturia    Obesity    Vitamin D deficiency    just placed on Vit D this week    Past Surgical History:  Procedure Laterality Date   ABDOMINAL HYSTERECTOMY     APPENDECTOMY     BACK SURGERY     x 3   CARDIAC CATHETERIZATION  04/13/2010   normal cororanies, EF 55% ([facility]; Dr. Clayborn Bigness)   CHOLECYSTECTOMY     COLONOSCOPY     COLONOSCOPY WITH PROPOFOL N/A 01/21/2017   Procedure: COLONOSCOPY WITH PROPOFOL;  Surgeon: Lollie Sails, MD;  Location: [facility] ENDOSCOPY;  Service: Endoscopy;  Laterality: N/A;   CORONARY ANGIOPLASTY     ESOPHAGOGASTRODUODENOSCOPY  06/12/2011   ESOPHAGOGASTRODUODENOSCOPY (EGD) WITH PROPOFOL N/A 01/21/2017   Procedure: ESOPHAGOGASTRODUODENOSCOPY (EGD) WITH PROPOFOL;  Surgeon: Lollie Sails, MD;  Location: [facility] ENDOSCOPY;  Service: Endoscopy;  Laterality: N/A;   FOOT  SURGERY Left    d/t neuroma   OOPHORECTOMY     radiation to thyroid     RIGHT/LEFT HEART CATH AND CORONARY ANGIOGRAPHY Bilateral 10/22/2016   Procedure: RIGHT/LEFT HEART CATH AND CORONARY ANGIOGRAPHY;  Surgeon: Yolonda Kida, MD;  Location: [place] CV LAB;  Service: Cardiovascular;  Laterality: Bilateral;   SPINAL CORD STIMULATOR INSERTION N/A 07/16/2013   Procedure: LUMBAR SPINAL CORD STIMULATOR INSERTION;  Surgeon: Bonna Gains, MD;  Location: [place];  Service: Neurosurgery;  Laterality: N/A;   THORACIC DISCECTOMY Left 04/24/2018   Procedure: Left Thoracic ten- thoracic eleven Microdiscectomy, Removal of Spinal Cord Stimulator;  Surgeon: Kristeen Miss, MD;  Location: [employer];  Service: Neurosurgery;  Laterality: Left;  Left Thoracic ten-  thoracic eleven Microdiscectomy, Removal of Spinal Cord Stimulator    Family Psychiatric History: Please see initial evaluation for full details. I have reviewed the history. No updates at this time.     Family History:  Family History  Problem Relation Age of Onset   Anxiety disorder Mother    Breast cancer Neg Hx     Social History:  Social History   Socioeconomic History   Marital status: Married    Spouse name: Not on file   Number of children: 4   Years of education: Not on file   Highest education level: Not on file  Occupational History   Not on file  Tobacco Use   Smoking status: Former    Packs/day: 0.50    Years: 0.00    Total pack years: 0.00    Types: Cigarettes    Quit date: 06/20/1986    Years since quitting: 35.1   Smokeless tobacco: Never  Vaping Use   Vaping Use: Never used  Substance and Sexual Activity   Alcohol use: No   Drug use: No   Sexual activity: Yes  Other Topics Concern   Not on file  Social History Narrative   Not on file   Social Determinants of Health   Financial Resource Strain: Not on file  Food Insecurity: Not on file  Transportation Needs: Not on file  Physical Activity: Not on file  Stress: Not on file  Social Connections: Not on file    Allergies: No Known Allergies  Metabolic Disorder Labs: No results found for: "HGBA1C", "MPG" No results found for: "PROLACTIN" Lab Results  Component Value Date   CHOL 182 09/04/2012   TRIG 137 09/04/2012   HDL 63 (H) 09/04/2012   VLDL 27 09/04/2012   LDLCALC 92 09/04/2012   Lab Results  Component Value Date   TSH 3.872 10/18/2020   TSH 2.560 11/11/2018    Therapeutic Level Labs: No results found for: "LITHIUM" No results found for: "VALPROATE" No results found for: "CBMZ"  Current Medications: Current Outpatient Medications  Medication Sig Dispense Refill   albuterol (VENTOLIN HFA) 108 (90 Base) MCG/ACT inhaler Inhale into the lungs.     amLODipine (NORVASC) 2.5 MG  tablet Take 2.5 mg by mouth daily.     apixaban (ELIQUIS) 5 MG TABS tablet Take by mouth.     beclomethasone (QVAR) 40 MCG/ACT inhaler Inhale into the lungs.     clonazePAM (KLONOPIN) 0.5 MG tablet Take 1 tablet (0.5 mg total) by mouth 2 (two) times daily as needed for anxiety. 60 tablet 1   dicyclomine (BENTYL) 10 MG capsule Take by mouth.     esomeprazole (NEXIUM) 40 MG capsule Take 40 mg by mouth daily at 12 noon.  Hyoscyamine Sulfate SL 0.125 MG SUBL Place under the tongue.     levothyroxine (SYNTHROID, LEVOTHROID) 75 MCG tablet Take 75 mcg by mouth daily before breakfast.     lisinopril (ZESTRIL) 40 MG tablet Take 1 tablet by mouth daily.     pantoprazole (PROTONIX) 40 MG tablet Take by mouth.     pantoprazole (PROTONIX) 40 MG tablet Take by mouth.     busPIRone (BUSPAR) 5 MG tablet Take 1 tablet (5 mg total) by mouth 2 (two) times daily. 60 tablet 0   No current facility-administered medications for this visit.     Musculoskeletal: Strength & Muscle Tone: within normal limits Gait & Station: normal (using a cane) Patient leans: N/A   Psychiatric Specialty Exam: Review of Systems  Psychiatric/Behavioral:  Positive for decreased concentration, dysphoric mood and sleep disturbance. Negative for agitation, behavioral problems, confusion, hallucinations, self-injury and suicidal ideas. The patient is nervous/anxious. The patient is not hyperactive.   All other systems reviewed and are negative.   Blood pressure 123/73, pulse 60, temperature 97.9 F (36.6 C), temperature source Temporal, weight 224 lb 6.4 oz (101.8 kg).Body mass index is 37.34 kg/m.  General Appearance: Fairly Groomed  Eye Contact:  Good  Speech:  Clear and Coherent  Volume:  Normal  Mood:  Anxious  Affect:  Appropriate, Congruent, and anxious  Thought Process:  Coherent  Orientation:  Full (Time, Place, and Person)  Thought Content: Logical   Suicidal Thoughts:  No  Homicidal Thoughts:  No  Memory:   Immediate;   Good  Judgement:  Good  Insight:  Good  Psychomotor Activity:  Normal  Concentration:  Concentration: Good and Attention Span: Good  Recall:  Good  Fund of Knowledge: Good  Language: Good  Akathisia:  No  Handed:  Right  AIMS (if indicated): not done  Assets:  Communication Skills Desire for Improvement  ADL's:  Intact  Cognition: WNL  Sleep:  Poor   Screenings: GAD-7    Flowsheet Row Office Visit from 03/12/2021 in [place]  Total GAD-7 Score 12      PHQ2-9    [place] Office Visit from 08/02/2021 in [place] Office Visit from 07/09/2021 in [place] Office Visit from 05/14/2021 in [place] Office Visit from 04/16/2021 in [place] Office Visit from 03/12/2021 in [place]  PHQ-2 Total Score '5 2 3 5 4  '$ PHQ-9 Total Score '13 8 7 16 12      '$ [place] Office Visit from 08/02/2021 in [place] Office Visit from 07/09/2021 in [place] Office Visit from 05/14/2021 in [place] No Risk No Risk Error: Question 6 not populated        Assessment and Plan:  Madison Jennings is a 81 y.o. year old female with a history of  anxiety,  hypertension, hypothyroidism, pAfib, bradycardia, reactive airway, GERD, fibromyalgia, Spinal stenosis of lumbar region without neurogenic claudication, who presents for follow up appointment for below.   1. GAD (generalized anxiety disorder) 2. MDD (major depressive disorder), recurrent episode, moderate ([place]) Exam is notable for rumination on her somatic symptoms of "smothering".  Although she discontinued BuSpar due to her concern of adverse reaction, she continues to experience similar physical symptoms of shortness of breath.  After discussing  treatment options, she is amenable to restart BuSpar for anxiety.  Noted that she has been seen by  cardiologist, pulmonologist and GI provider for her physical symptoms.  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 Will continue clonazepam as needed for anxiety. Although she will greatly benefit from CBT, she is not interested in this. .    # r/o essential tremor Her tremor is consistent with essential tremor, although anxiety can contribute to her symptoms to certain extent.  She is advised to discuss with her PCP if any worsening.     Plan Restart Buspar 5 mg twice a day Next appointment: 7/6 at 2:30  for 30 mins, in person     Past trials of medication: sertraline, bupropion (headache), duloxetine (limited benefit), venlafaxine (palpitation, wheezing according to the patient), mirtazapine (dream, increase in appetite), gabapentin (increased in appetite), valium          Collaboration of Care: Collaboration of Care: Other reviewed notes from other providers as above  Patient/Guardian was advised Release of Information must be obtained prior to any record release in order to collaborate their care with an outside provider. Patient/Guardian was advised if they have not already done so to contact the registration department to sign all necessary forms in order for [place] to release information regarding their care.   Consent: Patient/Guardian gives verbal consent for treatment and assignment of benefits for services provided during this visit. Patient/Guardian expressed understanding and agreed to proceed.    Norman Clay, MD 08/02/2021, 11:37 AM

## 2021-08-04 ENCOUNTER — Other Ambulatory Visit: Payer: Self-pay | Admitting: Psychiatry

## 2021-08-06 ENCOUNTER — Ambulatory Visit: Payer: Medicare Other | Admitting: Psychiatry

## 2021-08-06 ENCOUNTER — Ambulatory Visit
Admission: RE | Admit: 2021-08-06 | Discharge: 2021-08-06 | Disposition: A | Discharge status: Home / Self Care | Payer: Medicare Other | Source: Ambulatory Visit | Attending: Gastroenterology | Admitting: Gastroenterology

## 2021-08-06 DIAGNOSIS — K219 Gastro-esophageal reflux disease without esophagitis: Secondary | ICD-10-CM | POA: Diagnosis present

## 2021-08-06 DIAGNOSIS — R109 Unspecified abdominal pain: Secondary | ICD-10-CM | POA: Diagnosis present

## 2021-08-06 DIAGNOSIS — G8929 Other chronic pain: Secondary | ICD-10-CM

## 2021-08-06 LAB — POCT I-STAT CREATININE: Creatinine, Ser: 1.1 mg/dL — ABNORMAL HIGH (ref 0.44–1.00)

## 2021-08-06 MED ORDER — IOHEXOL 300 MG/ML  SOLN
100.0000 mL | Freq: Once | INTRAMUSCULAR | Status: AC | PRN
  Administered 2021-08-06: 100 mL via INTRAVENOUS

## 2021-08-08 ENCOUNTER — Encounter: Payer: Self-pay | Admitting: Gastroenterology

## 2021-08-09 ENCOUNTER — Encounter: Payer: Self-pay | Admitting: Gastroenterology

## 2021-08-09 ENCOUNTER — Encounter
Admission: RE | Disposition: A | Discharge status: Home / Self Care | Payer: Self-pay | Source: Home / Self Care | Attending: Gastroenterology

## 2021-08-09 ENCOUNTER — Ambulatory Visit: Payer: Medicare Other | Admitting: Anesthesiology

## 2021-08-09 ENCOUNTER — Ambulatory Visit
Admission: RE | Admit: 2021-08-09 | Discharge: 2021-08-09 | Disposition: A | Discharge status: Home / Self Care | Payer: Medicare Other | Attending: Gastroenterology | Admitting: Gastroenterology

## 2021-08-09 DIAGNOSIS — K2970 Gastritis, unspecified, without bleeding: Secondary | ICD-10-CM | POA: Insufficient documentation

## 2021-08-09 DIAGNOSIS — Z6837 Body mass index (BMI) 37.0-37.9, adult: Secondary | ICD-10-CM | POA: Insufficient documentation

## 2021-08-09 DIAGNOSIS — K2289 Other specified disease of esophagus: Secondary | ICD-10-CM | POA: Diagnosis not present

## 2021-08-09 DIAGNOSIS — M797 Fibromyalgia: Secondary | ICD-10-CM | POA: Diagnosis not present

## 2021-08-09 DIAGNOSIS — J45909 Unspecified asthma, uncomplicated: Secondary | ICD-10-CM | POA: Insufficient documentation

## 2021-08-09 DIAGNOSIS — Z8673 Personal history of transient ischemic attack (TIA), and cerebral infarction without residual deficits: Secondary | ICD-10-CM | POA: Diagnosis not present

## 2021-08-09 DIAGNOSIS — L570 Actinic keratosis: Secondary | ICD-10-CM | POA: Insufficient documentation

## 2021-08-09 DIAGNOSIS — R1013 Epigastric pain: Secondary | ICD-10-CM | POA: Diagnosis present

## 2021-08-09 DIAGNOSIS — Z79899 Other long term (current) drug therapy: Secondary | ICD-10-CM | POA: Diagnosis not present

## 2021-08-09 DIAGNOSIS — G8929 Other chronic pain: Secondary | ICD-10-CM | POA: Insufficient documentation

## 2021-08-09 DIAGNOSIS — E039 Hypothyroidism, unspecified: Secondary | ICD-10-CM | POA: Insufficient documentation

## 2021-08-09 DIAGNOSIS — N183 Chronic kidney disease, stage 3 unspecified: Secondary | ICD-10-CM | POA: Insufficient documentation

## 2021-08-09 DIAGNOSIS — Z87891 Personal history of nicotine dependence: Secondary | ICD-10-CM | POA: Diagnosis not present

## 2021-08-09 DIAGNOSIS — I480 Paroxysmal atrial fibrillation: Secondary | ICD-10-CM | POA: Diagnosis not present

## 2021-08-09 DIAGNOSIS — I129 Hypertensive chronic kidney disease with stage 1 through stage 4 chronic kidney disease, or unspecified chronic kidney disease: Secondary | ICD-10-CM | POA: Insufficient documentation

## 2021-08-09 DIAGNOSIS — E669 Obesity, unspecified: Secondary | ICD-10-CM | POA: Insufficient documentation

## 2021-08-09 DIAGNOSIS — K224 Dyskinesia of esophagus: Secondary | ICD-10-CM | POA: Diagnosis not present

## 2021-08-09 HISTORY — PX: ESOPHAGOGASTRODUODENOSCOPY: SHX5428

## 2021-08-09 SURGERY — EGD (ESOPHAGOGASTRODUODENOSCOPY)
Anesthesia: General

## 2021-08-09 MED ORDER — FENTANYL CITRATE (PF) 100 MCG/2ML IJ SOLN
INTRAMUSCULAR | Status: AC
  Filled 2021-08-09: qty 2

## 2021-08-09 MED ORDER — LIDOCAINE HCL (CARDIAC) PF 100 MG/5ML IV SOSY
PREFILLED_SYRINGE | INTRAVENOUS | Status: DC | PRN
  Administered 2021-08-09: 80 mg via INTRAVENOUS

## 2021-08-09 MED ORDER — PROPOFOL 500 MG/50ML IV EMUL
INTRAVENOUS | Status: DC | PRN
  Administered 2021-08-09: 75 ug/kg/min via INTRAVENOUS

## 2021-08-09 MED ORDER — GLYCOPYRROLATE 0.2 MG/ML IJ SOLN
INTRAMUSCULAR | Status: DC | PRN
  Administered 2021-08-09: .2 mg via INTRAVENOUS

## 2021-08-09 MED ORDER — PROPOFOL 10 MG/ML IV BOLUS
INTRAVENOUS | Status: DC | PRN
  Administered 2021-08-09: 20 mg via INTRAVENOUS
  Administered 2021-08-09: 30 mg via INTRAVENOUS

## 2021-08-09 MED ORDER — SODIUM CHLORIDE 0.9 % IV SOLN: INTRAVENOUS | Status: DC

## 2021-08-09 NOTE — Transfer of Care (Signed)
Immediate Anesthesia Transfer of Care Note  Patient: Madison Jennings  Procedure(s) Performed: ESOPHAGOGASTRODUODENOSCOPY (EGD)  Patient Location: PACU  Anesthesia Type:General  Level of Consciousness: sedated  Airway & Oxygen Therapy: Patient Spontanous Breathing and Patient connected to nasal cannula oxygen  Post-op Assessment: Report given to RN and Post -op Vital signs reviewed and stable  Post vital signs: Reviewed and stable  Last Vitals:  Vitals Value Taken Time  BP 102/49 08/09/21 0936  Temp 36 C 08/09/21 0936  Pulse 52 08/09/21 0936  Resp 18 08/09/21 0936  SpO2 94 % 08/09/21 0936  Vitals shown include unvalidated device data.  Last Pain:  Vitals:   08/09/21 0936  TempSrc: Temporal  PainSc:          Complications: No notable events documented.

## 2021-08-09 NOTE — H&P (Signed)
Pre-Procedure H&P   Patient ID: Madison Jennings is a 81 y.o. female.  Gastroenterology Provider: Annamaria Helling, DO  Referring Provider: Laurine Blazer, PA PCP: [name], MD  Date: 08/09/2021  HPI Ms. Madison Jennings is a 81 y.o. female who presents today for Esophagogastroduodenoscopy for dysphagia, abdominal pain, bloating, nausea, early satiety.  Pt on eliquis which has been held for procedure. Last dose Friday or Saturday Has had abdominal pain postprandially and at night 1-2x per week. Notes bloating and early satiety accompanied by nausea but no vomiting. Has solid food dysphagia but no issues with liquids, pills or odynophagia.  S/p radioablation of thyroids.  Protonix was increased to '40mg'$  twice daily.  EGD in 12/2016 with gastritis; 2015 and 2013 egd wnl. CT only demonstrated sigmoid diverticulosis Cr 1.2 hgb 15 mcv 94 plt 301 12/2016- csy redundant colon; negative microscopic colitis; also underwent 2013 2014 S/p chole   Past Medical History:  Diagnosis Date   Anxiety    Arthritis    Asthma    Chronic back pain    Chronic back pain    scoliosis and 2 buldging disc   Depression    Diastolic dysfunction    Diverticulosis    Dysrhythmia    PALPITATIONS   Fibromyalgia    Gastritis    GERD (gastroesophageal reflux disease)    takes Protonix daily   Headache(784.0)    sinus related   Hyperlipidemia    just diagnosed;will follow up in 6wks to discuss meds   Hypertension    takes Lisinopril daily   Hypothyroidism    takes Synthroid daily   Ischemic colitis ([place])    Joint pain    Nocturia    Obesity    Vitamin D deficiency    just placed on Vit D this week    Past Surgical History:  Procedure Laterality Date   ABDOMINAL HYSTERECTOMY     APPENDECTOMY     BACK SURGERY     x 3   CARDIAC CATHETERIZATION  04/13/2010   normal cororanies, EF 55% ([facility]; Dr. Clayborn Bigness)   CHOLECYSTECTOMY     COLONOSCOPY     COLONOSCOPY WITH PROPOFOL N/A  01/21/2017   Procedure: COLONOSCOPY WITH PROPOFOL;  Surgeon: Lollie Sails, MD;  Location: [facility] ENDOSCOPY;  Service: Endoscopy;  Laterality: N/A;   CORONARY ANGIOPLASTY     ESOPHAGOGASTRODUODENOSCOPY  06/12/2011   ESOPHAGOGASTRODUODENOSCOPY (EGD) WITH PROPOFOL N/A 01/21/2017   Procedure: ESOPHAGOGASTRODUODENOSCOPY (EGD) WITH PROPOFOL;  Surgeon: Lollie Sails, MD;  Location: [facility] ENDOSCOPY;  Service: Endoscopy;  Laterality: N/A;   FOOT SURGERY Left    d/t neuroma   OOPHORECTOMY     radiation to thyroid     RIGHT/LEFT HEART CATH AND CORONARY ANGIOGRAPHY Bilateral 10/22/2016   Procedure: RIGHT/LEFT HEART CATH AND CORONARY ANGIOGRAPHY;  Surgeon: Yolonda Kida, MD;  Location: [facility];  Service: Cardiovascular;  Laterality: Bilateral;   SPINAL CORD STIMULATOR INSERTION N/A 07/16/2013   Procedure: LUMBAR SPINAL CORD STIMULATOR INSERTION;  Surgeon: Bonna Gains, MD;  Location: [place];  Service: Neurosurgery;  Laterality: N/A;   THORACIC DISCECTOMY Left 04/24/2018   Procedure: Left Thoracic ten- thoracic eleven Microdiscectomy, Removal of Spinal Cord Stimulator;  Surgeon: Kristeen Miss, MD;  Location: [place];  Service: Neurosurgery;  Laterality: Left;  Left Thoracic ten- thoracic eleven Microdiscectomy, Removal of Spinal Cord Stimulator    Family History No h/o GI disease or malignancy  Review of Systems  Constitutional:  Positive for appetite change.  Negative for activity change, chills, diaphoresis, fatigue, fever and unexpected weight change.  HENT:  Positive for trouble swallowing (solids). Negative for voice change.   Respiratory:  Negative for shortness of breath and wheezing.   Cardiovascular:  Negative for chest pain, palpitations and leg swelling.  Gastrointestinal:  Positive for abdominal pain (post prandial). Negative for abdominal distention, anal bleeding, blood in stool, constipation, diarrhea, nausea, rectal pain and vomiting.       +bloating   Musculoskeletal:  Negative for arthralgias and myalgias.  Skin:  Negative for color change and pallor.  Neurological:  Negative for dizziness, syncope and weakness.  Psychiatric/Behavioral:  Negative for confusion.   All other systems reviewed and are negative.    Medications No current facility-administered medications on file prior to encounter.   Current Outpatient Medications on File Prior to Encounter  Medication Sig Dispense Refill   levothyroxine (SYNTHROID, LEVOTHROID) 75 MCG tablet Take 75 mcg by mouth daily before breakfast.     propranolol (INDERAL) 10 MG tablet Take 10 mg by mouth 3 (three) times daily.     amLODipine (NORVASC) 2.5 MG tablet Take 2.5 mg by mouth daily.     esomeprazole (NEXIUM) 40 MG capsule Take 40 mg by mouth daily at 12 noon. (Patient not taking: Reported on 08/09/2021)     Hyoscyamine Sulfate SL 0.125 MG SUBL Place under the tongue.      Pertinent medications related to GI and procedure were reviewed by me with the patient prior to the procedure   Current Facility-Administered Medications:    0.9 %  sodium chloride infusion, , Intravenous, Continuous, Annamaria Helling, DO  sodium chloride         No Known Allergies Allergies were reviewed by me prior to the procedure  Objective   Body mass index is 37.11 kg/m. Vitals:   08/09/21 0852  BP: (!) 163/70  Pulse: (!) 44  Resp: 18  Temp: (!) 96.8 F (36 C)  TempSrc: Temporal  SpO2: 99%  Weight: 101.2 kg  Height: '5\' 5"'$  (1.651 m)     Physical Exam Vitals and nursing note reviewed.  Constitutional:      General: She is not in acute distress.    Appearance: Normal appearance. She is obese. She is not ill-appearing, toxic-appearing or diaphoretic.  HENT:     Head: Normocephalic and atraumatic.     Nose: Nose normal.     Mouth/Throat:     Mouth: Mucous membranes are moist.     Pharynx: Oropharynx is clear.  Eyes:     General: No scleral icterus.    Extraocular Movements:  Extraocular movements intact.  Cardiovascular:     Rate and Rhythm: Regular rhythm. Bradycardia present.     Heart sounds: Normal heart sounds. No murmur heard.    No friction rub. No gallop.  Pulmonary:     Effort: Pulmonary effort is normal. No respiratory distress.     Breath sounds: Normal breath sounds. No wheezing, rhonchi or rales.  Abdominal:     General: Bowel sounds are normal. There is no distension.     Palpations: Abdomen is soft.     Tenderness: There is no abdominal tenderness. There is no guarding or rebound.  Musculoskeletal:     Cervical back: Neck supple.     Right lower leg: Edema present.     Left lower leg: Edema present.  Skin:    General: Skin is warm and dry.     Coloration: Skin is not jaundiced or  pale.  Neurological:     General: No focal deficit present.     Mental Status: She is alert and oriented to person, place, and time. Mental status is at baseline.  Psychiatric:        Mood and Affect: Mood normal.        Behavior: Behavior normal.        Thought Content: Thought content normal.        Judgment: Judgment normal.      Assessment:  Ms. Madison Jennings is a 81 y.o. female  who presents today for Esophagogastroduodenoscopy for dysphagia, abdominal pain, bloating, nausea, early satiety.  Plan:  Esophagogastroduodenoscopy with possible intervention today  Esophagogastroduodenoscopy with possible biopsy, control of bleeding, polypectomy, and interventions as necessary has been discussed with the patient/patient representative. Informed consent was obtained from the patient/patient representative after explaining the indication, nature, and risks of the procedure including but not limited to death, bleeding, perforation, missed neoplasm/lesions, cardiorespiratory compromise, and reaction to medications. Opportunity for questions was given and appropriate answers were provided. Patient/patient representative has verbalized understanding is amenable to  undergoing the procedure.   Annamaria Helling, DO  [facility] Gastroenterology  Portions of the record may have been created with voice recognition software. Occasional wrong-word or 'sound-a-like' substitutions may have occurred due to the inherent limitations of voice recognition software.  Read the chart carefully and recognize, using context, where substitutions may have occurred.

## 2021-08-09 NOTE — Anesthesia Preprocedure Evaluation (Addendum)
Anesthesia Evaluation  Patient identified by MRN, date of birth, ID band Patient awake    Reviewed: Allergy & Precautions, NPO status , Patient's Chart, lab work & pertinent test results  History of Anesthesia Complications Negative for: history of anesthetic complications  Airway Mallampati: IV   Neck ROM: Full    Dental  (+) Missing Crown :   Pulmonary asthma , former smoker (quit 1988),    Pulmonary exam normal breath sounds clear to auscultation       Cardiovascular hypertension, Normal cardiovascular exam+ dysrhythmias (a fib on Eliquis)  Rhythm:Regular Rate:Normal  Echo 07/19/21:  NORMAL LEFT VENTRICULAR SYSTOLIC FUNCTION WITH MODERATE LVH NORMAL RIGHT VENTRICULAR SYSTOLIC FUNCTION MILD VALVULAR REGURGITATION  NO VALVULAR STENOSIS ESTIMATED LVEF >55% Aortic: TRACE AI Mitral: MILD MR Tricuspid: TRIVIAL TR (PREV. EXAM BETTER VISUALIZED: MILD TR; 3.64ms) Pulmonic: MILD PI  ECG 06/19/21:  Normal sinus rhythm  T wave abnormality, consider lateral ischemia  Abnormal ECG  When compared with ECG of 20-Sep-2020 11:15,  No significant change was found   Myocardial perfusion 10/31/17:   Blood pressure demonstrated a normal response to exercise.   There was no ST segment deviation noted during stress.   The study is normal.   This is a low risk study.   The left ventricular ejection fraction is normal (55-65%).   Neuro/Psych  Headaches, PSYCHIATRIC DISORDERS Anxiety Depression Chronic back pain    GI/Hepatic GERD  ,  Endo/Other  Hypothyroidism Obesity   Renal/GU Renal disease (stage III CKD)     Musculoskeletal  (+) Arthritis , Fibromyalgia -  Abdominal   Peds  Hematology negative hematology ROS (+)   Anesthesia Other Findings Cardiology note 07/16/21:  Paroxysmal atrial fibrillation Bradycardia Reasonably controlled. The patient was previously experiencing dizziness. Confirmed by recent Holter  monitor.The Holter nature revealed a predominant rhythm of sinus bradycardia to sinus tachycardia with frequent supraventricular ectopy and one episode of atrial fibrillation with occurrences of RVR. The overall average heart rate 71 bpm, the PVC burden was 0.44%, there were 463 occurrences of supraventricular tachycardia and the atrial fibrillation burden was 2.38% with the longest episode lasting 1 hour and 28 minutes with the fastest ventricular rate being 132 bpm. The patient has a CHA2DS2-VASc score of 6. -Start Eliquis 5 mg by mouth twice daily. -The patient was thoroughly educated on the importance of following bleeding precautions and to report any signs or symptoms of abnormal bleeding to our office. She verbalized understanding of all information. - rate control not needed at this time due to the patient's underlying bradycardia. - -The patient was thoroughly educated on the importance of monitoring for signs and symptoms of bradycardia and to report if any occur to our office. He verbalized understanding of all information.  3. Dyspnea on exertion  4. Diastolic dysfunction 5. Chronic fatigue Chronic dyspnea on exertion likely secondary to underlying obesity and poor conditioning. The patient has a history of diastolic dysfunction. Her echocardiogram from 07/19/2021 revealed normal LV systolic function with moderate LVH, grade 1 diastolic dysfunction, mild valvular regurgitation and estimated EF of >55%. -Dyspnea management per pulmonary. Sleep study pending. -Continue lisinopril. -Consider adding low-dose diuretic therapy. -Recommend alternating between periods of activity and peers of rest.  6. History of stroke -Management per neurology. -A-fib management as above. - As part of secondary stroke prevention recommendations as follows:  -Blood pressure target range Systolic <<128(<<786in diabetics) / Diastolic 776-72 - Cholesterol range - fasting LDL < 70 mg/dL and checked every 6  months, -Exercise 30 minutes daily as tolerated. -Have aggressive oral care / maintain good teeth cleaning and good gum health.  7. Primary hypertension Chronic, well controlled. -Continue lisinopril and amlodipine. - -Recommend following DASH diet and monitoring blood pressure daily at home.   8. CKD stage III A -Management per PCP. - -Recommend avoiding OTC nephrotoxic agents including NSAIDs.  9. Obesity class II - -Counseling discussion included heart healthy lifestyle inclusive of avoidance of tobacco products, weight control, blood pressure and blood sugar control, and regular exercise regimen to include daily aerobic exercise for least 150 minutes per week, as tolerated, at a moderate intensity.  Return in about 1 month (around 08/16/2021). Discussed return precautions with patient for acute and chronic cardiovascular issues.   Reproductive/Obstetrics                            Anesthesia Physical Anesthesia Plan  ASA: 3  Anesthesia Plan: General   Post-op Pain Management:    Induction: Intravenous  PONV Risk Score and Plan: 3 and Propofol infusion, TIVA and Treatment may vary due to age or medical condition  Airway Management Planned: Natural Airway  Additional Equipment:   Intra-op Plan:   Post-operative Plan:   Informed Consent: I have reviewed the patients History and Physical, chart, labs and discussed the procedure including the risks, benefits and alternatives for the proposed anesthesia with the patient or authorized representative who has indicated his/her understanding and acceptance.       Plan Discussed with: CRNA  Anesthesia Plan Comments: (LMA/GETA backup discussed.  Patient consented for risks of anesthesia including but not limited to:  - adverse reactions to medications - damage to eyes, teeth, lips or other oral mucosa - nerve damage due to positioning  - sore throat or hoarseness - damage to heart, brain, nerves,  lungs, other parts of body or loss of life  Informed patient about role of CRNA in peri- and intra-operative care.  Patient voiced understanding.)       Anesthesia Quick Evaluation

## 2021-08-09 NOTE — Op Note (Signed)
[facility] Gastroenterology Patient Name: Madison Jennings Procedure Date: 08/09/2021 9:13 AM MRN: [id number] Account #: [id number] Date of Birth: 06/10/1940 Admit Type: Outpatient Age: 81 Room: [facility] ENDO ROOM 1 Gender: Female Note Status: Finalized Instrument Name: Upper Endoscope [id number] Procedure:             Upper GI endoscopy Indications:           Epigastric abdominal pain Providers:             Annamaria Helling DO, DO Referring MD:          [name], MD (Referring MD) Medicines:             Monitored Anesthesia Care Complications:         No immediate complications. Estimated blood loss:                         Minimal. Procedure:             Pre-Anesthesia Assessment:                        - Prior to the procedure, a History and Physical was                         performed, and patient medications and allergies were                         reviewed. The patient is competent. The risks and                         benefits of the procedure and the sedation options and                         risks were discussed with the patient. All questions                         were answered and informed consent was obtained.                         Patient identification and proposed procedure were                         verified by the physician, the nurse, the anesthetist                         and the technician in the endoscopy suite. Mental                         Status Examination: alert and oriented. Airway                         Examination: normal oropharyngeal airway and neck                         mobility. Respiratory Examination: clear to                         auscultation. CV Examination: RRR, no murmurs, no S3  or S4. Prophylactic Antibiotics: The patient does not                         require prophylactic antibiotics. Prior                         Anticoagulants: The patient has taken Eliquis                          (apixaban), last dose was 5 days prior to procedure.                         ASA Grade Assessment: III - A patient with severe                         systemic disease. After reviewing the risks and                         benefits, the patient was deemed in satisfactory                         condition to undergo the procedure. The anesthesia                         plan was to use monitored anesthesia care (MAC).                         Immediately prior to administration of medications,                         the patient was re-assessed for adequacy to receive                         sedatives. The heart rate, respiratory rate, oxygen                         saturations, blood pressure, adequacy of pulmonary                         ventilation, and response to care were monitored                         throughout the procedure. The physical status of the                         patient was re-assessed after the procedure.                        After obtaining informed consent, the endoscope was                         passed under direct vision. Throughout the procedure,                         the patient's blood pressure, pulse, and oxygen                         saturations were monitored continuously. The Endoscope  was introduced through the mouth, and advanced to the                         second part of duodenum. The upper GI endoscopy was                         accomplished without difficulty. The patient tolerated                         the procedure well. Findings:      The duodenal bulb, first portion of the duodenum and second portion of       the duodenum were normal. Estimated blood loss: none.      Localized mild inflammation characterized by erythema was found in the       gastric antrum. Biopsies were taken with a cold forceps for histology.       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       Estimated blood loss was minimal.      The Z-line was regular. Estimated blood loss: none.      Esophagogastric landmarks were identified: the gastroesophageal junction       was found at 35 cm from the incisors.      A single 1 to 2 mm mucosal nodule was found in the lower third of the       esophagus, 33 cm from the incisors. Biopsies were taken with a cold       forceps for histology. Estimated blood loss was minimal.      Normal mucosa was found in the entire esophagus. The scope was       withdrawn. Dilation was performed with a Maloney dilator with no       resistance at 52 Fr. The dilation site was examined following endoscope       reinsertion and showed no change. Estimated blood loss: none.      Abnormal motility was noted in the esophagus. The cricopharyngeus was       normal. There is spasticity of the esophageal body. The distal       esophagus/lower esophageal sphincter is open. Tertiary peristaltic waves       are noted.      The exam of the esophagus was otherwise normal. Impression:            - Normal duodenal bulb, first portion of the duodenum                         and second portion of the duodenum.                        - Gastritis. Biopsied.                        - Z-line regular.                        - Esophagogastric landmarks identified.                        - Mucosal nodule found in the esophagus. Biopsied.                        -  Normal mucosa was found in the entire esophagus.                         Dilated.                        - Abnormal esophageal motility, suspicious for                         presbyesophagus. Recommendation:        - Discharge patient to home.                        - Soft diet today.                        - Continue present medications.                        - No aspirin, ibuprofen, naproxen, or other                         non-steroidal anti-inflammatory  drugs for 5 days after                         biopsy.                        - Resume Eliquis (apixaban) at prior dose in 2 days.                         Refer to managing physician for further adjustment of                         therapy.                        - Await pathology results.                        - Return to GI clinic as previously scheduled.                        - Consider manometry/motility study if continuing to                         have issues with dysphagia.                        Can decrease pantoprazole to once daily if you do not                         notice a difference after increase to twice daily                        - The findings and recommendations were discussed with                         the patient. Procedure Code(s):     --- Professional ---  06770, Esophagogastroduodenoscopy, flexible,                         transoral; with biopsy, single or multiple                        43450, Dilation of esophagus, by unguided sound or                         bougie, single or multiple passes Diagnosis Code(s):     --- Professional ---                        K29.70, Gastritis, unspecified, without bleeding                        K22.8, Other specified diseases of esophagus                        K22.4, Dyskinesia of esophagus                        R10.13, Epigastric pain CPT copyright 2019 [employer]. All rights reserved. The codes documented in this report are preliminary and upon coder review may  be revised to meet current compliance requirements. Attending Participation:      I personally performed the entire procedure. Volney American, DO Annamaria Helling DO, DO 08/09/2021 9:38:38 AM This report has been signed electronically. Number of Addenda: 0 Note Initiated On: 08/09/2021 9:13 AM Estimated Blood Loss:  Estimated blood loss was minimal.      [facility]

## 2021-08-09 NOTE — Interval H&P Note (Signed)
History and Physical Interval Note: Preprocedure H&P from 08/09/21  was reviewed and there was no interval change after seeing and examining the patient.  Written consent was obtained from the patient after discussion of risks, benefits, and alternatives. Patient has consented to proceed with Esophagogastroduodenoscopy with possible intervention   08/09/2021 9:13 AM  Madison Jennings  has presented today for surgery, with the diagnosis of Chronic abdominal pain, unspecified (R10.9,G89.29) Gastroesophageal reflux disease, unspecified whether esophagitis present (K21.9) Dysphagia, unspecified type (R13.10).  The various methods of treatment have been discussed with the patient and family. After consideration of risks, benefits and other options for treatment, the patient has consented to  Procedure(s): ESOPHAGOGASTRODUODENOSCOPY (EGD) (N/A) as a surgical intervention.  The patient's history has been reviewed, patient examined, no change in status, stable for surgery.  I have reviewed the patient's chart and labs.  Questions were answered to the patient's satisfaction.     Annamaria Helling

## 2021-08-10 ENCOUNTER — Emergency Department
Admission: EM | Admit: 2021-08-10 | Discharge: 2021-08-10 | Disposition: A | Discharge status: Home / Self Care | Payer: Medicare Other | Attending: Emergency Medicine | Admitting: Emergency Medicine

## 2021-08-10 ENCOUNTER — Encounter: Payer: Self-pay | Admitting: Intensive Care

## 2021-08-10 ENCOUNTER — Emergency Department: Payer: Medicare Other

## 2021-08-10 ENCOUNTER — Other Ambulatory Visit: Payer: Self-pay

## 2021-08-10 DIAGNOSIS — R110 Nausea: Secondary | ICD-10-CM | POA: Diagnosis not present

## 2021-08-10 DIAGNOSIS — R002 Palpitations: Secondary | ICD-10-CM | POA: Diagnosis present

## 2021-08-10 DIAGNOSIS — E039 Hypothyroidism, unspecified: Secondary | ICD-10-CM | POA: Diagnosis not present

## 2021-08-10 DIAGNOSIS — N3000 Acute cystitis without hematuria: Secondary | ICD-10-CM

## 2021-08-10 DIAGNOSIS — R531 Weakness: Secondary | ICD-10-CM | POA: Insufficient documentation

## 2021-08-10 DIAGNOSIS — J45909 Unspecified asthma, uncomplicated: Secondary | ICD-10-CM | POA: Insufficient documentation

## 2021-08-10 DIAGNOSIS — R001 Bradycardia, unspecified: Secondary | ICD-10-CM | POA: Diagnosis not present

## 2021-08-10 DIAGNOSIS — I10 Essential (primary) hypertension: Secondary | ICD-10-CM | POA: Insufficient documentation

## 2021-08-10 LAB — URINALYSIS, ROUTINE W REFLEX MICROSCOPIC
Bilirubin Urine: NEGATIVE
Glucose, UA: NEGATIVE mg/dL
Hgb urine dipstick: NEGATIVE
Ketones, ur: NEGATIVE mg/dL
Nitrite: NEGATIVE
Protein, ur: NEGATIVE mg/dL
Specific Gravity, Urine: 1.008 (ref 1.005–1.030)
pH: 5 (ref 5.0–8.0)

## 2021-08-10 LAB — BASIC METABOLIC PANEL
Anion gap: 8 (ref 5–15)
BUN: 16 mg/dL (ref 8–23)
CO2: 22 mmol/L (ref 22–32)
Calcium: 9.1 mg/dL (ref 8.9–10.3)
Chloride: 111 mmol/L (ref 98–111)
Creatinine, Ser: 1.03 mg/dL — ABNORMAL HIGH (ref 0.44–1.00)
GFR, Estimated: 55 mL/min — ABNORMAL LOW (ref >60)
Glucose, Bld: 95 mg/dL (ref 70–99)
Potassium: 3.6 mmol/L (ref 3.5–5.1)
Sodium: 141 mmol/L (ref 135–145)

## 2021-08-10 LAB — LIPASE, BLOOD: Lipase: 23 U/L (ref 11–51)

## 2021-08-10 LAB — CBC
HCT: 43.8 % (ref 36.0–46.0)
Hemoglobin: 14.3 g/dL (ref 12.0–15.0)
MCH: 30.8 pg (ref 26.0–34.0)
MCHC: 32.6 g/dL (ref 30.0–36.0)
MCV: 94.4 fL (ref 80.0–100.0)
Platelets: 236 10*3/uL (ref 150–400)
RBC: 4.64 MIL/uL (ref 3.87–5.11)
RDW: 13.1 % (ref 11.5–15.5)
WBC: 7.7 10*3/uL (ref 4.0–10.5)
nRBC: 0 % (ref 0.0–0.2)

## 2021-08-10 LAB — TROPONIN I (HIGH SENSITIVITY): Troponin I (High Sensitivity): 7 ng/L (ref <18)

## 2021-08-10 LAB — PROTIME-INR
INR: 1 (ref 0.8–1.2)
Prothrombin Time: 13.1 seconds (ref 11.4–15.2)

## 2021-08-10 MED ORDER — CEPHALEXIN 500 MG PO CAPS
500.0000 mg | ORAL_CAPSULE | Freq: Once | ORAL | Status: AC
  Administered 2021-08-10: 500 mg via ORAL
  Filled 2021-08-10 (×2): qty 1

## 2021-08-10 MED ORDER — ONDANSETRON 4 MG PO TBDP
4.0000 mg | ORAL_TABLET | Freq: Once | ORAL | Status: AC | PRN
  Administered 2021-08-10: 4 mg via ORAL
  Filled 2021-08-10: qty 1

## 2021-08-10 MED ORDER — CEPHALEXIN 250 MG PO CAPS: 250.0000 mg | ORAL_CAPSULE | Freq: Four times a day (QID) | ORAL | 0 refills | Status: AC

## 2021-08-10 NOTE — ED Triage Notes (Addendum)
Patient reports she has been nauseas, weakness and feeling heart palpitations.

## 2021-08-10 NOTE — ED Provider Notes (Signed)
[facility] Provider Note    Event Date/Time   First MD Initiated Contact with Patient 08/10/21 1647     (approximate)   History   Nausea and Palpitations   HPI  Madison Jennings is a 81 y.o. female with significant past medical history of hypertension, asthma, hypothyroidism presents to the emergency department for treatment and evaluation of nausea, weakness, and palpitations.  She states that she had an EGD yesterday and her heart rate dropped to 34.  She has an appointment scheduled with cardiology but he is out of town until the 27th.  She has recently been changed from amlodipine to metoprolol.  Otherwise, no changes to her medications.  Past Medical History:  Diagnosis Date   Anxiety    Arthritis    Asthma    Chronic back pain    Chronic back pain    scoliosis and 2 buldging disc   Depression    Diastolic dysfunction    Diverticulosis    Dysrhythmia    PALPITATIONS   Fibromyalgia    Gastritis    GERD (gastroesophageal reflux disease)    takes Protonix daily   Headache(784.0)    sinus related   Hyperlipidemia    just diagnosed;will follow up in 6wks to discuss meds   Hypertension    takes Lisinopril daily   Hypothyroidism    takes Synthroid daily   Ischemic colitis ([place])    Joint pain    Nocturia    Obesity    Vitamin D deficiency    just placed on Vit D this week     Physical Exam   Triage Vital Signs: ED Triage Vitals  Enc Vitals Group     BP 08/10/21 1605 (!) 130/52     Pulse Rate 08/10/21 1605 (!) 50     Resp 08/10/21 1605 (!) 22     Temp 08/10/21 1605 98.2 F (36.8 C)     Temp Source 08/10/21 1605 Oral     SpO2 08/10/21 1605 95 %     Weight 08/10/21 1606 223 lb (101.2 kg)     Height 08/10/21 1606 '5\' 5"'$  (1.651 m)     Head Circumference --      Peak Flow --      Pain Score 08/10/21 1606 0     Pain Loc --      Pain Edu? --      Excl. in [place]? --     Most recent vital signs: Vitals:   08/10/21 1830 08/10/21 1845   BP: (!) 126/55   Pulse: (!) 51 (!) 52  Resp: (!) 22 16  Temp:    SpO2: 92% 96%    General: Awake, no distress.  CV:  Good peripheral perfusion.  Resp:  Normal effort.  Abd:  No distention.  Other:  Normal neuro exam.   ED Results / Procedures / Treatments   Labs (all labs ordered are listed, but only abnormal results are displayed) Labs Reviewed  BASIC METABOLIC PANEL - Abnormal; Notable for the following components:      Result Value   Creatinine, Ser 1.03 (*)    GFR, Estimated 55 (*)    All other components within normal limits  URINALYSIS, ROUTINE W REFLEX MICROSCOPIC - Abnormal; Notable for the following components:   Color, Urine YELLOW (*)    APPearance CLEAR (*)    Leukocytes,Ua MODERATE (*)    Bacteria, UA RARE (*)    All other components within normal limits  CBC  PROTIME-INR  LIPASE, BLOOD  TROPONIN I (HIGH SENSITIVITY)     EKG  Sinus bradycardia first-degree AV block with a rate of 56.  No ST changes.   RADIOLOGY  Chest x-ray negative for acute cardiopulmonary abnormality.  I have independently reviewed and interpreted imaging as well as reviewed report from radiology.  PROCEDURES:  Critical Care performed: No  Procedures   MEDICATIONS ORDERED IN ED:  Medications  cephALEXin (KEFLEX) capsule 500 mg (has no administration in time range)  ondansetron (ZOFRAN-ODT) disintegrating tablet 4 mg (4 mg Oral Given 08/10/21 1613)     IMPRESSION / MDM / ASSESSMENT AND PLAN / ED COURSE   I reviewed the triage vital signs and the nursing notes.  Differential diagnosis includes, but is not limited to: Bradycardia, CAD, electrolyte disturbance, UTI, medication side effect  Patient's presentation is most consistent with acute presentation with potential threat to life or bodily function.  The patient is on the cardiac monitor to evaluate for evidence of arrhythmia and/or significant heart rate changes.  81 year old female presenting to the emergency  department for treatment and evaluation of generalized weakness and palpitations that have become worse over the past week.  See HPI for further details.  Review of outpatient office visits patient has been evaluated by cardiology and pulmonology within the past few weeks as well as by her primary care provider.  She has had medication changes.  Most recently, she was taken off the amlodipine and has started propranolol.  Yesterday, she had an EGD and her heart rate was noted to be in the 30s.  She has scheduled appointment with cardiology but Dr. Clayborn Bigness is currently out of town.  Imaging and labs are all reassuring.  She does have moderate leukocytes and rare bacteria in the urine.  This may well be the cause of her weakness.  She has remained on the cardiac monitor while here and is noted to be bradycardic into the 50s.  She will be advised to hold the propranolol until she is able to follow-up with either primary care or cardiology.  Admission was considered but because she has appropriate close outpatient follow-up available plan will be to discharge her home.  Patient is comfortable with this plan.  ER return precautions were discussed.     FINAL CLINICAL IMPRESSION(S) / ED DIAGNOSES   Final diagnoses:  Acute cystitis without hematuria  Bradycardia  Palpitations     Rx / DC Orders   ED Discharge Orders          Ordered    cephALEXin (KEFLEX) 250 MG capsule  4 times daily        08/10/21 1930    Ambulatory referral to Cardiology       Comments: If you have not heard from the Cardiology office within the next 72 hours please call [phone].   08/10/21 1932             Note:  This document was prepared using Dragon voice recognition software and may include unintentional dictation errors.   Victorino Dike, FNP 08/10/21 Lattie Corns    Lavonia Drafts, MD 08/10/21 2133

## 2021-08-10 NOTE — Anesthesia Postprocedure Evaluation (Signed)
Anesthesia Post Note  Patient: LYNANNE DELGRECO  Procedure(s) Performed: ESOPHAGOGASTRODUODENOSCOPY (EGD)  Patient location during evaluation: PACU Anesthesia Type: General Level of consciousness: awake and alert, oriented and patient cooperative Pain management: pain level controlled Vital Signs Assessment: post-procedure vital signs reviewed and stable Respiratory status: spontaneous breathing, nonlabored ventilation and respiratory function stable Cardiovascular status: blood pressure returned to baseline and stable Postop Assessment: adequate PO intake Anesthetic complications: no   No notable events documented.   Last Vitals:  Vitals:   08/09/21 0946 08/09/21 0956  BP: 111/62 138/60  Pulse: (!) 56 (!) 50  Resp: 14 20  Temp:    SpO2: 93% 95%    Last Pain:  Vitals:   08/10/21 0728  TempSrc:   PainSc: 0-No pain                 Darrin Nipper

## 2021-08-10 NOTE — ED Notes (Signed)
This RN offered in and out cath to pt for urine specimen collection. Pt. Decided shw would attempt to get up to toilet to urinate. Pt. Up to in room toilet with stand by assist, urine specimen obtained

## 2021-08-10 NOTE — Discharge Instructions (Addendum)
Do not restart the propranolol.  Follow-up with cardiology and your primary care provider.  Return to the emergency department for symptoms of change or worsen if unable to schedule an appointment.

## 2021-08-10 NOTE — ED Notes (Signed)
ED Provider at bedside. 

## 2021-08-13 ENCOUNTER — Encounter: Payer: Self-pay | Admitting: Gastroenterology

## 2021-08-14 LAB — SURGICAL PATHOLOGY

## 2021-08-27 NOTE — Progress Notes (Unsigned)
BH MD/PA/NP OP Progress Note  08/30/2021 3:53 PM Madison Jennings  MRN:  [id number]  Chief Complaint:  Chief Complaint  Patient presents with   Depression   Follow-up   Anxiety   HPI:  - she was found to have HR of 34 She states that she discontinued BuSpar due to headache and migraine.  She needs something for her mood.  She does not feel like doing anything, and wants to cry most of the time. She also struggles with back pain.  She had a good day on July 4 with her children, doing grill out.  She has middle insomnia, which she partly attributes to pain.  She has depressive symptoms as being PHQ-9 .  She has good appetite.  She denies SI.  She has intense anxiety and panic attacks without any triggers.  She states that she is not on any pain medication.  She did not want to be addicted on opioid, and her pain specialist did not prescribe opioid as she is on clonazepam.  Although it was discussed if she is interested in tapering off clonazepam so that she can be back on pain medication, she feels hesitant to be off clonazepam.   Her daughter presents to the interview.  She thinks Madison Jennings was doing better when she was on pain medication. Her daughter thinks she needs some medication for anxiety.   Wt Readings from Last 3 Encounters:  08/30/21 224 lb (101.6 kg)  08/10/21 223 lb (101.2 kg)  08/09/21 223 lb (101.2 kg)     Visit Diagnosis:    ICD-10-CM   1. GAD (generalized anxiety disorder)  F41.1     2. MDD (major depressive disorder), recurrent episode, mild ([place])  F33.0       Past Psychiatric History: Please see initial evaluation for full details. I have reviewed the history. No updates at this time.     Past Medical History:  Past Medical History:  Diagnosis Date   Anxiety    Arthritis    Asthma    Chronic back pain    Chronic back pain    scoliosis and 2 buldging disc   Depression    Diastolic dysfunction    Diverticulosis    Dysrhythmia    PALPITATIONS   Fibromyalgia     Gastritis    GERD (gastroesophageal reflux disease)    takes Protonix daily   Headache(784.0)    sinus related   Hyperlipidemia    just diagnosed;will follow up in 6wks to discuss meds   Hypertension    takes Lisinopril daily   Hypothyroidism    takes Synthroid daily   Ischemic colitis ([place])    Joint pain    Nocturia    Obesity    Vitamin D deficiency    just placed on Vit D this week    Past Surgical History:  Procedure Laterality Date   ABDOMINAL HYSTERECTOMY     APPENDECTOMY     BACK SURGERY     x 3   CARDIAC CATHETERIZATION  04/13/2010   normal cororanies, EF 55% ([facility]; Dr. Clayborn Bigness)   CHOLECYSTECTOMY     COLONOSCOPY     COLONOSCOPY WITH PROPOFOL N/A 01/21/2017   Procedure: COLONOSCOPY WITH PROPOFOL;  Surgeon: Lollie Sails, MD;  Location[facility] ENDOSCOPY;  Service: Endoscopy;  Laterality: N/A;   CORONARY ANGIOPLASTY     ESOPHAGOGASTRODUODENOSCOPY  06/12/2011   ESOPHAGOGASTRODUODENOSCOPY N/A 08/09/2021   Procedure: ESOPHAGOGASTRODUODENOSCOPY (EGD);  Surgeon: Annamaria Helling, DO;  Location: [facility] ENDOSCOPY;  Service: Gastroenterology;  Laterality: N/A;   ESOPHAGOGASTRODUODENOSCOPY (EGD) WITH PROPOFOL N/A 01/21/2017   Procedure: ESOPHAGOGASTRODUODENOSCOPY (EGD) WITH PROPOFOL;  Surgeon: Lollie Sails, MD;  Location: [facility] ENDOSCOPY;  Service: Endoscopy;  Laterality: N/A;   FOOT SURGERY Left    d/t neuroma   OOPHORECTOMY     radiation to thyroid     RIGHT/LEFT HEART CATH AND CORONARY ANGIOGRAPHY Bilateral 10/22/2016   Procedure: RIGHT/LEFT HEART CATH AND CORONARY ANGIOGRAPHY;  Surgeon: Yolonda Kida, MD;  Location: [place] CV LAB;  Service: Cardiovascular;  Laterality: Bilateral;   SPINAL CORD STIMULATOR INSERTION N/A 07/16/2013   Procedure: LUMBAR SPINAL CORD STIMULATOR INSERTION;  Surgeon: Bonna Gains, MD;  Location: [place];  Service: Neurosurgery;  Laterality: N/A;   THORACIC DISCECTOMY Left 04/24/2018   Procedure: Left Thoracic ten- thoracic  eleven Microdiscectomy, Removal of Spinal Cord Stimulator;  Surgeon: Kristeen Miss, MD;  Location: [place];  Service: Neurosurgery;  Laterality: Left;  Left Thoracic ten- thoracic eleven Microdiscectomy, Removal of Spinal Cord Stimulator    Family Psychiatric History: Please see initial evaluation for full details. I have reviewed the history. No updates at this time.     Family History:  Family History  Problem Relation Age of Onset   Anxiety disorder Mother    Breast cancer Neg Hx     Social History:  Social History   Socioeconomic History   Marital status: Married    Spouse name: Not on file   Number of children: 4   Years of education: Not on file   Highest education level: Not on file  Occupational History   Not on file  Tobacco Use   Smoking status: Former    Packs/day: 0.50    Years: 0.00    Total pack years: 0.00    Types: Cigarettes    Quit date: 06/20/1986    Years since quitting: 35.2   Smokeless tobacco: Never  Vaping Use   Vaping Use: Never used  Substance and Sexual Activity   Alcohol use: No   Drug use: No   Sexual activity: Yes  Other Topics Concern   Not on file  Social History Narrative   Not on file   Social Determinants of Health   Financial Resource Strain: Not on file  Food Insecurity: Not on file  Transportation Needs: Not on file  Physical Activity: Not on file  Stress: Not on file  Social Connections: Not on file    Allergies: No Known Allergies  Metabolic Disorder Labs: No results found for: "HGBA1C", "MPG" No results found for: "PROLACTIN" Lab Results  Component Value Date   CHOL 182 09/04/2012   TRIG 137 09/04/2012   HDL 63 (H) 09/04/2012   VLDL 27 09/04/2012   LDLCALC 92 09/04/2012   Lab Results  Component Value Date   TSH 3.872 10/18/2020   TSH 2.560 11/11/2018    Therapeutic Level Labs: No results found for: "LITHIUM" No results found for: "VALPROATE" No results found for: "CBMZ"  Current Medications: Current  Outpatient Medications  Medication Sig Dispense Refill   albuterol (VENTOLIN HFA) 108 (90 Base) MCG/ACT inhaler Inhale into the lungs.     amLODipine (NORVASC) 2.5 MG tablet Take 2.5 mg by mouth daily.     apixaban (ELIQUIS) 5 MG TABS tablet Take 5 mg by mouth 2 (two) times daily.     beclomethasone (QVAR) 40 MCG/ACT inhaler Inhale 2 puffs into the lungs 2 (two) times daily.     clonazePAM (KLONOPIN) 0.5 MG tablet  Take 1 tablet (0.5 mg total) by mouth 2 (two) times daily as needed for anxiety. 60 tablet 1   dicyclomine (BENTYL) 10 MG capsule Take by mouth.     esomeprazole (NEXIUM) 40 MG capsule Take 40 mg by mouth daily at 12 noon.     Hyoscyamine Sulfate SL 0.125 MG SUBL Place under the tongue.     levothyroxine (SYNTHROID, LEVOTHROID) 75 MCG tablet Take 75 mcg by mouth daily before breakfast.     lisinopril (ZESTRIL) 40 MG tablet Take 1 tablet by mouth daily.     pantoprazole (PROTONIX) 40 MG tablet Take 40 mg by mouth daily.     pantoprazole (PROTONIX) 40 MG tablet Take by mouth.     propranolol (INDERAL) 10 MG tablet Take 10 mg by mouth 3 (three) times daily.     Vilazodone HCl (VIIBRYD) 10 MG TABS Take 0.5 tablets (5 mg total) by mouth daily. 15 tablet 0   No current facility-administered medications for this visit.     Musculoskeletal: Strength & Muscle Tone: within normal limits Gait & Station: normal Patient leans: N/A  Psychiatric Specialty Exam: Review of Systems  Psychiatric/Behavioral:  Positive for dysphoric mood and sleep disturbance. Negative for agitation, behavioral problems, confusion, decreased concentration, hallucinations, self-injury and suicidal ideas. The patient is nervous/anxious. The patient is not hyperactive.   All other systems reviewed and are negative.   Blood pressure 131/82, pulse (!) 55, height '5\' 5"'$  (1.651 m), weight 224 lb (101.6 kg), SpO2 96 %.Body mass index is 37.28 kg/m.  General Appearance: Fairly Groomed  Eye Contact:  Good  Speech:   Clear and Coherent  Volume:  Normal  Mood:  Anxious  Affect:  Appropriate, Congruent, and down  Thought Process:  Coherent  Orientation:  Full (Time, Place, and Person)  Thought Content: Logical   Suicidal Thoughts:  No  Homicidal Thoughts:  No  Memory:  Immediate;   Good  Judgement:  Good  Insight:  Good  Psychomotor Activity:  Normal  Concentration:  Concentration: Good and Attention Span: Good  Recall:  Good  Fund of Knowledge: Good  Language: Good  Akathisia:  No  Handed:  Right  AIMS (if indicated): not done  Assets:  Communication Skills Desire for Improvement  ADL's:  Intact  Cognition: WNL  Sleep:  Poor   Screenings: GAD-7    Flowsheet Row Office Visit from 03/12/2021 in [place]  Total GAD-7 Score 12      PHQ2-9    [place] Visit from 08/30/2021 in [place] Visit from 08/02/2021 in [place] Visit from 07/09/2021 in [place] Visit from 05/14/2021 in [place] Office Visit from 04/16/2021 in [place]  PHQ-2 Total Score '3 5 2 3 5  '$ PHQ-9 Total Score '9 13 8 7 16      '$ [place] Office Visit from 08/30/2021 in [place] ED from 08/10/2021 in [place] Admission (Discharged) from 08/09/2021 in [place] No Risk No Risk Error: Question 6 not populated        Assessment and Plan:  KAIMANA NEUZIL is a 81 y.o. year old female with a history of anxiety,  hypertension, hypothyroidism, pAfib, bradycardia, reactive airway, GERD, fibromyalgia, Spinal stenosis of lumbar region without neurogenic claudication , who presents for follow up appointment for below.    1. GAD (  generalized anxiety disorder) 2. MDD (major depressive disorder),  recurrent episode, mild ([place]) She continues to experience anxiety, depression in the context of unbearable back pain. She could not tolerate BuSpar due to adverse reaction of headache.  Will try Viibryd to target depression and anxiety.  Will continue clonazepam as needed for anxiety. Although she will greatly benefit from CBT, she is not interested in this.  She was advised to contact her PCP for any options for her pain.     Plan Discontinue buspar Start Viibryd 5 mg daily Next appointment: 8/1 at 3:30 for 30 mins, in person     Past trials of medication: sertraline, bupropion (headache), duloxetine (limited benefit), venlafaxine (palpitation, wheezing according to the patient), mirtazapine (dream, increase in appetite), buspar (headache, migraine),  gabapentin (increased in appetite), valium    Collaboration of Care: Collaboration of Care: Other N/A  Patient/Guardian was advised Release of Information must be obtained prior to any record release in order to collaborate their care with an outside provider. Patient/Guardian was advised if they have not already done so to contact the registration department to sign all necessary forms in order for [place] to release information regarding their care.   Consent: Patient/Guardian gives verbal consent for treatment and assignment of benefits for services provided during this visit. Patient/Guardian expressed understanding and agreed to proceed.    Norman Clay, MD 08/30/2021, 3:53 PM

## 2021-08-28 ENCOUNTER — Other Ambulatory Visit: Payer: Self-pay | Admitting: Psychiatry

## 2021-08-30 ENCOUNTER — Encounter: Payer: Self-pay | Admitting: Psychiatry

## 2021-08-30 ENCOUNTER — Ambulatory Visit (INDEPENDENT_AMBULATORY_CARE_PROVIDER_SITE_OTHER): Payer: Medicare Other | Admitting: Psychiatry

## 2021-08-30 VITALS — BP 131/82 | HR 55 | Ht 65.0 in | Wt 224.0 lb

## 2021-08-30 DIAGNOSIS — F411 Generalized anxiety disorder: Secondary | ICD-10-CM

## 2021-08-30 DIAGNOSIS — F330 Major depressive disorder, recurrent, mild: Secondary | ICD-10-CM | POA: Diagnosis not present

## 2021-08-30 MED ORDER — VILAZODONE HCL 10 MG PO TABS
5.0000 mg | ORAL_TABLET | Freq: Every day | ORAL | 0 refills | Status: DC
Start: 2021-08-30 — End: 2021-09-25

## 2021-08-31 ENCOUNTER — Telehealth: Payer: Self-pay | Admitting: *Deleted

## 2021-08-31 NOTE — Telephone Encounter (Signed)
Pt called stated ---Dr Modesta Messing prescribe new med. Rx Vilazodone HCl (VIIBRYD) 10 MG  (0.5 tab) --cause diarrhea, heart palpitation. Pt stated been sick all night after taking the medication.

## 2021-08-31 NOTE — Telephone Encounter (Signed)
Notified pt- to d/c Rx Viibyrd and instructions by Dr. Modesta Messing. Pt voiced understanding.

## 2021-08-31 NOTE — Telephone Encounter (Signed)
Please contact the patient to discontinue Viibryd. Please advise her to be seen by her primary care if any worsening in her symptoms despite discontinuation of the medication. I would advise that she continues clonazepam only at this time given she had side effect from other medication/antidepressant.

## 2021-09-23 NOTE — Progress Notes (Unsigned)
[facility] MD/PA/NP OP Progress Note  09/25/2021 4:04 PM Madison Jennings  MRN:  [id number]  Chief Complaint:  Chief Complaint  Patient presents with   Follow-up   HPI:  This is a follow-up appointment for depression and anxiety.  She states that she feels like crying over anything.  She is worried that something is going to happen.  She canceled appointment with pulmonologist.  Although she has occasional wheezing, it is not bothering her, and she does not think she needs inhaler.  There is worsening in her back pain.  She took hydrocodone in the morning.  She has an appointment with her pain provider.  She may consider being back on this medication.  She is going to church when she can/when she has less pain.  She has insomnia, mainly due to pain.  She has depressive symptoms as a PHQ-9.  She has good appetite.  She denies SI.  She takes clonazepam twice a day for anxiety.  She had palpitation from [place].  She agrees to hold this medication and stays on clonazepam only to see if her mood improves after intervention for pain.   Wt Readings from Last 3 Encounters:  09/25/21 225 lb (102.1 kg)  08/30/21 224 lb (101.6 kg)  08/10/21 223 lb (101.2 kg)      Visit Diagnosis:    ICD-10-CM   1. GAD (generalized anxiety disorder)  F41.1     2. MDD (major depressive disorder), recurrent episode, mild ([place])  F33.0       Past Psychiatric History: Please see initial evaluation for full details. I have reviewed the history. No updates at this time.     Past Medical History:  Past Medical History:  Diagnosis Date   Anxiety    Arthritis    Asthma    Chronic back pain    Chronic back pain    scoliosis and 2 buldging disc   Depression    Diastolic dysfunction    Diverticulosis    Dysrhythmia    PALPITATIONS   Fibromyalgia    Gastritis    GERD (gastroesophageal reflux disease)    takes Protonix daily   Headache(784.0)    sinus related   Hyperlipidemia    just diagnosed;will follow up in 6wks to  discuss meds   Hypertension    takes Lisinopril daily   Hypothyroidism    takes Synthroid daily   Ischemic colitis ([place])    Joint pain    Nocturia    Obesity    Vitamin D deficiency    just placed on Vit D this week    Past Surgical History:  Procedure Laterality Date   ABDOMINAL HYSTERECTOMY     APPENDECTOMY     BACK SURGERY     x 3   CARDIAC CATHETERIZATION  04/13/2010   normal cororanies, EF 55% ([facility]; Dr. Clayborn Bigness)   CHOLECYSTECTOMY     COLONOSCOPY     COLONOSCOPY WITH PROPOFOL N/A 01/21/2017   Procedure: COLONOSCOPY WITH PROPOFOL;  Surgeon: Lollie Sails, MD;  Location: [facility] ENDOSCOPY;  Service: Endoscopy;  Laterality: N/A;   CORONARY ANGIOPLASTY     ESOPHAGOGASTRODUODENOSCOPY  06/12/2011   ESOPHAGOGASTRODUODENOSCOPY N/A 08/09/2021   Procedure: ESOPHAGOGASTRODUODENOSCOPY (EGD);  Surgeon: Annamaria Helling, DO;  Location[facility] ENDOSCOPY;  Service: Gastroenterology;  Laterality: N/A;   ESOPHAGOGASTRODUODENOSCOPY (EGD) WITH PROPOFOL N/A 01/21/2017   Procedure: ESOPHAGOGASTRODUODENOSCOPY (EGD) WITH PROPOFOL;  Surgeon: Lollie Sails, MD;  Location: [facility] ENDOSCOPY;  Service: Endoscopy;  Laterality: N/A;   FOOT SURGERY  Left    d/t neuroma   OOPHORECTOMY     radiation to thyroid     RIGHT/LEFT HEART CATH AND CORONARY ANGIOGRAPHY Bilateral 10/22/2016   Procedure: RIGHT/LEFT HEART CATH AND CORONARY ANGIOGRAPHY;  Surgeon: Yolonda Kida, MD;  Location: [place] CV LAB;  Service: Cardiovascular;  Laterality: Bilateral;   SPINAL CORD STIMULATOR INSERTION N/A 07/16/2013   Procedure: LUMBAR SPINAL CORD STIMULATOR INSERTION;  Surgeon: Bonna Gains, MD;  Location: [place];  Service: Neurosurgery;  Laterality: N/A;   THORACIC DISCECTOMY Left 04/24/2018   Procedure: Left Thoracic ten- thoracic eleven Microdiscectomy, Removal of Spinal Cord Stimulator;  Surgeon: Kristeen Miss, MD;  Location: [place];  Service: Neurosurgery;  Laterality: Left;  Left Thoracic ten- thoracic  eleven Microdiscectomy, Removal of Spinal Cord Stimulator    Family Psychiatric History: Please see initial evaluation for full details. I have reviewed the history. No updates at this time.     Family History:  Family History  Problem Relation Age of Onset   Anxiety disorder Mother    Breast cancer Neg Hx     Social History:  Social History   Socioeconomic History   Marital status: Married    Spouse name: Not on file   Number of children: 4   Years of education: Not on file   Highest education level: Not on file  Occupational History   Not on file  Tobacco Use   Smoking status: Former    Packs/day: 0.50    Years: 0.00    Total pack years: 0.00    Types: Cigarettes    Quit date: 06/20/1986    Years since quitting: 35.2   Smokeless tobacco: Never  Vaping Use   Vaping Use: Never used  Substance and Sexual Activity   Alcohol use: No   Drug use: No   Sexual activity: Yes  Other Topics Concern   Not on file  Social History Narrative   Not on file   Social Determinants of Health   Financial Resource Strain: Not on file  Food Insecurity: Not on file  Transportation Needs: Not on file  Physical Activity: Not on file  Stress: Not on file  Social Connections: Not on file    Allergies: No Known Allergies  Metabolic Disorder Labs: No results found for: "HGBA1C", "MPG" No results found for: "PROLACTIN" Lab Results  Component Value Date   CHOL 182 09/04/2012   TRIG 137 09/04/2012   HDL 63 (H) 09/04/2012   VLDL 27 09/04/2012   LDLCALC 92 09/04/2012   Lab Results  Component Value Date   TSH 3.872 10/18/2020   TSH 2.560 11/11/2018    Therapeutic Level Labs: No results found for: "LITHIUM" No results found for: "VALPROATE" No results found for: "CBMZ"  Current Medications: Current Outpatient Medications  Medication Sig Dispense Refill   albuterol (VENTOLIN HFA) 108 (90 Base) MCG/ACT inhaler Inhale into the lungs.     amLODipine (NORVASC) 2.5 MG tablet  Take 2.5 mg by mouth daily.     apixaban (ELIQUIS) 5 MG TABS tablet Take 5 mg by mouth 2 (two) times daily.     beclomethasone (QVAR) 40 MCG/ACT inhaler Inhale 2 puffs into the lungs 2 (two) times daily.     dicyclomine (BENTYL) 10 MG capsule Take by mouth.     esomeprazole (NEXIUM) 40 MG capsule Take 40 mg by mouth daily at 12 noon.     Hyoscyamine Sulfate SL 0.125 MG SUBL Place under the tongue.     levothyroxine (  SYNTHROID, LEVOTHROID) 75 MCG tablet Take 75 mcg by mouth daily before breakfast.     lisinopril (ZESTRIL) 40 MG tablet Take 1 tablet by mouth daily.     pantoprazole (PROTONIX) 40 MG tablet Take by mouth.     propranolol (INDERAL) 10 MG tablet Take 10 mg by mouth 3 (three) times daily.     [START ON 10/22/2021] clonazePAM (KLONOPIN) 0.5 MG tablet Take 1 tablet (0.5 mg total) by mouth 2 (two) times daily as needed for anxiety. 60 tablet 0   nitroGLYCERIN (NITROSTAT) 0.4 MG SL tablet Place under the tongue.     Vitamin D, Ergocalciferol, (DRISDOL) 1.25 MG (50000 UNIT) CAPS capsule Take 50,000 Units by mouth once a week.     No current facility-administered medications for this visit.     Musculoskeletal: Strength & Muscle Tone: within normal limits Gait & Station:  uses a cane Patient leans: N/A  Psychiatric Specialty Exam: Review of Systems  Psychiatric/Behavioral:  Positive for dysphoric mood and sleep disturbance. Negative for agitation, behavioral problems, confusion, decreased concentration, hallucinations, self-injury and suicidal ideas. The patient is nervous/anxious. The patient is not hyperactive.   All other systems reviewed and are negative.   Blood pressure 111/66, pulse (!) 54, temperature 97.9 F (36.6 C), temperature source Temporal, weight 225 lb (102.1 kg).Body mass index is 37.44 kg/m.  General Appearance: Fairly Groomed  Eye Contact:  Good  Speech:  Clear and Coherent  Volume:  Normal  Mood:  Anxious  Affect:  Appropriate, Congruent, and slightly  anxious, but reactive  Thought Process:  Coherent  Orientation:  Full (Time, Place, and Person)  Thought Content: Logical   Suicidal Thoughts:  No  Homicidal Thoughts:  No  Memory:  Immediate;   Good  Judgement:  Good  Insight:  Good  Psychomotor Activity:  Normal  Concentration:  Concentration: Good and Attention Span: Good  Recall:  Good  Fund of Knowledge: Good  Language: Good  Akathisia:  No  Handed:  Right  AIMS (if indicated): not done  Assets:  Communication Skills Desire for Improvement  ADL's:  Intact  Cognition: WNL  Sleep:  Poor   Screenings: GAD-7    Flowsheet Row Office Visit from 03/12/2021 in [place]  Total GAD-7 Score 12      PHQ2-9    [place] Visit from 09/25/2021 in [place] Office Visit from 08/30/2021 in [place] Office Visit from 08/02/2021 in [place] Office Visit from 07/09/2021 in [place] Office Visit from 05/14/2021 in [place]  PHQ-2 Total Score '3 3 5 2 3  '$ PHQ-9 Total Score '9 9 13 8 7      '$ [place] Office Visit from 09/25/2021 in [place] Office Visit from 08/30/2021 in [facility] [place] ED from 08/10/2021 in [place] No Risk No Risk No Risk        Assessment and Plan:  DAJUANA PALEN is a 81 y.o. year old female with a history of anxiety,  hypertension, hypothyroidism, pAfib, bradycardia, reactive airway, GERD, fibromyalgia, Spinal stenosis of lumbar region without neurogenic claudication, who presents for follow up appointment for below.     1. GAD (generalized anxiety disorder) 2. MDD (major depressive disorder), recurrent episode, mild ([place]) She continues to report anxiety, depressive symptoms in the context of worsening  in back pain. She had an adverse reaction of  palpitation from [place].  Will discontinue this medication.  Given her reaction to other antidepressants and her next appointment for pain evaluation, will hold any other medication change at this time.  Will continue clonazepam as needed for anxiety.  Discussed potential risk of dependence, fall and over sedation. Although she will greatly benefit from CBT, she is not interested in this.   Plan Discontinue Viibryd  Continue clonazepam 0.5 mg twice a day as needed for anxiety Next appointment: 9/5 at 3:30 for 30 mins, in person     Past trials of medication: sertraline, bupropion (headache), duloxetine (limited benefit), venlafaxine (palpitation, wheezing according to the patient), mirtazapine (dream, increase in appetite), Buspar (headache, migraine),  gabapentin (increased in appetite), valium         Collaboration of Care: Collaboration of Care: Other N/A  Patient/Guardian was advised Release of Information must be obtained prior to any record release in order to collaborate their care with an outside provider. Patient/Guardian was advised if they have not already done so to contact the registration department to sign all necessary forms in order for [place] to release information regarding their care.   Consent: Patient/Guardian gives verbal consent for treatment and assignment of benefits for services provided during this visit. Patient/Guardian expressed understanding and agreed to proceed.    Norman Clay, MD 09/25/2021, 4:04 PM

## 2021-09-25 ENCOUNTER — Ambulatory Visit (INDEPENDENT_AMBULATORY_CARE_PROVIDER_SITE_OTHER): Payer: Medicare Other | Admitting: Psychiatry

## 2021-09-25 ENCOUNTER — Encounter: Payer: Self-pay | Admitting: Psychiatry

## 2021-09-25 ENCOUNTER — Other Ambulatory Visit: Payer: Self-pay | Admitting: Psychiatry

## 2021-09-25 ENCOUNTER — Telehealth: Payer: Self-pay | Admitting: Psychiatry

## 2021-09-25 VITALS — BP 111/66 | HR 54 | Temp 97.9°F | Wt 225.0 lb

## 2021-09-25 DIAGNOSIS — F330 Major depressive disorder, recurrent, mild: Secondary | ICD-10-CM

## 2021-09-25 DIAGNOSIS — F411 Generalized anxiety disorder: Secondary | ICD-10-CM

## 2021-09-25 MED ORDER — CLONAZEPAM 0.5 MG PO TABS: 0.5000 mg | ORAL_TABLET | Freq: Two times a day (BID) | ORAL | 0 refills | Status: DC | PRN

## 2021-09-25 NOTE — Telephone Encounter (Signed)
Patient reportedly cannot keep appointment today at 1530. Called to reschedule and left voicemail requesting call back.

## 2021-09-29 ENCOUNTER — Emergency Department
Admission: EM | Admit: 2021-09-29 | Discharge: 2021-09-29 | Disposition: A | Discharge status: Home / Self Care | Payer: Medicare Other | Attending: Emergency Medicine | Admitting: Emergency Medicine

## 2021-09-29 ENCOUNTER — Emergency Department: Payer: Medicare Other

## 2021-09-29 ENCOUNTER — Other Ambulatory Visit: Payer: Self-pay

## 2021-09-29 ENCOUNTER — Encounter: Payer: Self-pay | Admitting: Emergency Medicine

## 2021-09-29 DIAGNOSIS — Z20822 Contact with and (suspected) exposure to covid-19: Secondary | ICD-10-CM | POA: Insufficient documentation

## 2021-09-29 DIAGNOSIS — I509 Heart failure, unspecified: Secondary | ICD-10-CM | POA: Diagnosis not present

## 2021-09-29 DIAGNOSIS — R519 Headache, unspecified: Secondary | ICD-10-CM | POA: Diagnosis not present

## 2021-09-29 DIAGNOSIS — J45909 Unspecified asthma, uncomplicated: Secondary | ICD-10-CM | POA: Insufficient documentation

## 2021-09-29 DIAGNOSIS — I110 Hypertensive heart disease with heart failure: Secondary | ICD-10-CM | POA: Diagnosis not present

## 2021-09-29 DIAGNOSIS — N3000 Acute cystitis without hematuria: Secondary | ICD-10-CM | POA: Diagnosis not present

## 2021-09-29 DIAGNOSIS — E039 Hypothyroidism, unspecified: Secondary | ICD-10-CM | POA: Insufficient documentation

## 2021-09-29 DIAGNOSIS — Z79899 Other long term (current) drug therapy: Secondary | ICD-10-CM | POA: Insufficient documentation

## 2021-09-29 DIAGNOSIS — R001 Bradycardia, unspecified: Secondary | ICD-10-CM | POA: Insufficient documentation

## 2021-09-29 DIAGNOSIS — R1030 Lower abdominal pain, unspecified: Secondary | ICD-10-CM | POA: Diagnosis present

## 2021-09-29 LAB — CBC WITH DIFFERENTIAL/PLATELET
Abs Immature Granulocytes: 0.01 10*3/uL (ref 0.00–0.07)
Basophils Absolute: 0 10*3/uL (ref 0.0–0.1)
Basophils Relative: 1 %
Eosinophils Absolute: 0.3 10*3/uL (ref 0.0–0.5)
Eosinophils Relative: 4 %
HCT: 44 % (ref 36.0–46.0)
Hemoglobin: 14.4 g/dL (ref 12.0–15.0)
Immature Granulocytes: 0 %
Lymphocytes Relative: 61 %
Lymphs Abs: 4.1 10*3/uL — ABNORMAL HIGH (ref 0.7–4.0)
MCH: 30.7 pg (ref 26.0–34.0)
MCHC: 32.7 g/dL (ref 30.0–36.0)
MCV: 93.8 fL (ref 80.0–100.0)
Monocytes Absolute: 0.6 10*3/uL (ref 0.1–1.0)
Monocytes Relative: 9 %
Neutro Abs: 1.7 10*3/uL (ref 1.7–7.7)
Neutrophils Relative %: 25 %
Platelets: 238 10*3/uL (ref 150–400)
RBC: 4.69 MIL/uL (ref 3.87–5.11)
RDW: 13.1 % (ref 11.5–15.5)
WBC: 6.7 10*3/uL (ref 4.0–10.5)
nRBC: 0 % (ref 0.0–0.2)

## 2021-09-29 LAB — SARS CORONAVIRUS 2 BY RT PCR: SARS Coronavirus 2 by RT PCR: NEGATIVE

## 2021-09-29 LAB — URINALYSIS, ROUTINE W REFLEX MICROSCOPIC
Bilirubin Urine: NEGATIVE
Glucose, UA: NEGATIVE mg/dL
Hgb urine dipstick: NEGATIVE
Ketones, ur: NEGATIVE mg/dL
Nitrite: POSITIVE — AB
Protein, ur: NEGATIVE mg/dL
Specific Gravity, Urine: 1.015 (ref 1.005–1.030)
WBC, UA: >50 WBC/hpf — ABNORMAL HIGH (ref 0–5)
pH: 5 (ref 5.0–8.0)

## 2021-09-29 LAB — COMPREHENSIVE METABOLIC PANEL
ALT: 16 U/L (ref 0–44)
AST: 15 U/L (ref 15–41)
Albumin: 3.8 g/dL (ref 3.5–5.0)
Alkaline Phosphatase: 96 U/L (ref 38–126)
Anion gap: 8 (ref 5–15)
BUN: 13 mg/dL (ref 8–23)
CO2: 22 mmol/L (ref 22–32)
Calcium: 8.9 mg/dL (ref 8.9–10.3)
Chloride: 111 mmol/L (ref 98–111)
Creatinine, Ser: 1.04 mg/dL — ABNORMAL HIGH (ref 0.44–1.00)
GFR, Estimated: 55 mL/min — ABNORMAL LOW (ref >60)
Glucose, Bld: 102 mg/dL — ABNORMAL HIGH (ref 70–99)
Potassium: 4.2 mmol/L (ref 3.5–5.1)
Sodium: 141 mmol/L (ref 135–145)
Total Bilirubin: 0.7 mg/dL (ref 0.3–1.2)
Total Protein: 6.6 g/dL (ref 6.5–8.1)

## 2021-09-29 LAB — TSH: TSH: 3.06 u[IU]/mL (ref 0.350–4.500)

## 2021-09-29 LAB — LIPASE, BLOOD: Lipase: 36 U/L (ref 11–51)

## 2021-09-29 LAB — TROPONIN I (HIGH SENSITIVITY): Troponin I (High Sensitivity): 8 ng/L (ref <18)

## 2021-09-29 MED ORDER — CEPHALEXIN 500 MG PO CAPS: 500.0000 mg | ORAL_CAPSULE | Freq: Two times a day (BID) | ORAL | 0 refills | Status: AC

## 2021-09-29 MED ORDER — CEPHALEXIN 500 MG PO CAPS
500.0000 mg | ORAL_CAPSULE | Freq: Once | ORAL | Status: AC
  Administered 2021-09-29: 500 mg via ORAL
  Filled 2021-09-29: qty 1

## 2021-09-29 MED ORDER — SODIUM CHLORIDE 0.9 % IV SOLN: 1.0000 g | Freq: Once | INTRAVENOUS | Status: DC

## 2021-09-29 NOTE — ED Provider Notes (Signed)
[facility] Provider Note    Event Date/Time   First MD Initiated Contact with Patient 09/29/21 2034     (approximate)   History   Weakness   HPI  Madison Jennings is a 81 y.o. female with a past medical history of HTN, hypothyroidism, GERD, fibromyalgia, depression, CHF, chronic back pain, anxiety, thyroiditis, asthma, obesity, ischemic colitis and previous UTIs who presents for evaluation of some crampiness in her suprapubic region starting yesterday as well as "buzzing" in her head.  She states this always happens when she gets UTIs.  She denies any change in her chronic back pain in the upper abdominal pain, fevers, cough, chest pain, shortness of breath, nausea, vomiting, diarrhea or constipation.  She denies any abnormal vaginal bleeding or discharge.  States she has had a mild headache today.  She took some Azo the other day but denies any other medications for symptoms.    Past Medical History:  Diagnosis Date   Anxiety    Arthritis    Asthma    Chronic back pain    Chronic back pain    scoliosis and 2 buldging disc   Depression    Diastolic dysfunction    Diverticulosis    Dysrhythmia    PALPITATIONS   Fibromyalgia    Gastritis    GERD (gastroesophageal reflux disease)    takes Protonix daily   Headache(784.0)    sinus related   Hyperlipidemia    just diagnosed;will follow up in 6wks to discuss meds   Hypertension    takes Lisinopril daily   Hypothyroidism    takes Synthroid daily   Ischemic colitis ([place])    Joint pain    Nocturia    Obesity    Vitamin D deficiency    just placed on Vit D this week     Physical Exam  Triage Vital Signs: ED Triage Vitals  Enc Vitals Group     BP 09/29/21 1802 (!) 150/65     Pulse Rate 09/29/21 1802 (!) 50     Resp 09/29/21 1802 18     Temp 09/29/21 1802 98.2 F (36.8 C)     Temp Source 09/29/21 1802 Oral     SpO2 09/29/21 1802 95 %     Weight 09/29/21 1802 222 lb (100.7 kg)     Height  09/29/21 1802 '5\' 5"'$  (1.651 m)     Head Circumference --      Peak Flow --      Pain Score 09/29/21 1805 5     Pain Loc --      Pain Edu? --      Excl. in [place]? --     Most recent vital signs: Vitals:   09/29/21 1802 09/29/21 2045  BP: (!) 150/65 (!) 144/104  Pulse: (!) 50 (!) 49  Resp: 18 17  Temp: 98.2 F (36.8 C)   SpO2: 95% 100%    General: Awake, no distress.  CV:  Good peripheral perfusion.  2+ radial pulses.  Slightly bradycardic. Resp:  Normal effort.  Clear bilaterally Abd:  No distention.  Soft throughout with some mild tenderness in the suprapubic region Other:  Some very mild bilateral CVA tenderness patient states that this is baseline secondary to her chronic back pain.  Cranial nerves II through XII grossly intact.  No pronator drift.  No finger dysmetria.  Symmetric 5/5 strength of all extremities.  Sensation intact to light touch in all extremities.     ED  Results / Procedures / Treatments  Labs (all labs ordered are listed, but only abnormal results are displayed) Labs Reviewed  CBC WITH DIFFERENTIAL/PLATELET - Abnormal; Notable for the following components:      Result Value   Lymphs Abs 4.1 (*)    All other components within normal limits  URINALYSIS, ROUTINE W REFLEX MICROSCOPIC - Abnormal; Notable for the following components:   Color, Urine AMBER (*)    APPearance HAZY (*)    Nitrite POSITIVE (*)    Leukocytes,Ua LARGE (*)    WBC, UA >50 (*)    Bacteria, UA RARE (*)    All other components within normal limits  COMPREHENSIVE METABOLIC PANEL - Abnormal; Notable for the following components:   Glucose, Bld 102 (*)    Creatinine, Ser 1.04 (*)    GFR, Estimated 55 (*)    All other components within normal limits  SARS CORONAVIRUS 2 BY RT PCR  URINE CULTURE  LIPASE, BLOOD  TSH  TROPONIN I (HIGH SENSITIVITY)     EKG  EKG is remarkable for sinus bradycardia with first-degree block with a PR interval of 222, nonspecific ST change versus  artifact in lead III and aVF as well as aVL without other clear evidence of acute ischemia or significant arrhythmia.   RADIOLOGY CT head on my interpretation without evidence of hemorrhage, ischemia, edema, mass effect or other acute intracranial process.  Also reviewed radiology's interpretation.   PROCEDURES:  Critical Care performed: No  Procedures  The patient is on the cardiac monitor to evaluate for evidence of arrhythmia and/or significant heart rate changes.   MEDICATIONS ORDERED IN ED: Medications  cefTRIAXone (ROCEPHIN) 1 g in sodium chloride 0.9 % 100 mL IVPB (has no administration in time range)     IMPRESSION / MDM / ASSESSMENT AND PLAN / ED COURSE  I reviewed the triage vital signs and the nursing notes. Patient's presentation is most consistent with acute presentation with potential threat to life or bodily function.                               Differential diagnosis includes, but is not limited to status, pyelonephritis, kidney stone, electrolyte derangements.  No exam features to suggest a CVA.  EKG is remarkable for sinus bradycardia with first-degree block with a PR interval of 222, nonspecific ST change versus artifact in lead III and aVF as well as aVL without other clear evidence of acute ischemia or significant arrhythmia.  Troponin is nonelevated and given this was obtained greater than 3 hours after symptom onset Evalose patient for ACS  CT head on my interpretation without evidence of hemorrhage, ischemia, edema, mass effect or other acute intracranial process.  Also reviewed radiology's interpretation.  CT abdomen pelvis on my interpretation without evidence of perinephric stranding, kidney stone, diverticulitis or other clear process.  I reviewed radiology interpretation and agree to findings of atrophic left kidney and sigmoid diverticulosis as well as aortic atherosclerosis without diverticulitis or other acute abdominal or pelvic process.  CMP  without any significant electrolyte or metabolic derangements.  CBC without leukocytosis or acute anemia.  Lipase not suggestive of pancreatitis.  TSH WNL.  I suspect UTI is likely the source of patient's symptoms.  I do not think she is septic.  I think her bradycardia is currently asymptomatic.  I will treat this with a course of Keflex.Marland Kitchen  Discharged in stable condition.  Strict turn precautions advised and  discussed      FINAL CLINICAL IMPRESSION(S) / ED DIAGNOSES   Final diagnoses:  Acute cystitis without hematuria     Rx / DC Orders   ED Discharge Orders     None        Note:  This document was prepared using Dragon voice recognition software and may include unintentional dictation errors.   Lucrezia Starch, MD 09/29/21 [phone]

## 2021-09-29 NOTE — ED Notes (Signed)
ED Provider at bedside. 

## 2021-09-29 NOTE — ED Provider Triage Note (Signed)
Emergency Medicine Provider Triage Evaluation Note  Madison Jennings , a 81 y.o. female  was evaluated in triage.  Pt complains of weakness, dysuria, and headache. Has been wanting to sleep all day. Headache began gradually. Reports LLQ and RLQ pain. No fever/chills. No vomiting. Reports that she has a headache with ears ringing which comes and goes.  Review of Systems  Positive: Headache, dysuria, fatigue Negative: N/v/d  Physical Exam  There were no vitals taken for this visit. Gen:   Awake, no distress   Resp:  Normal effort  MSK:   Moves extremities without difficulty  Other:    Medical Decision Making  Medically screening exam initiated at 6:01 PM.  Appropriate orders placed.  MARGARUITE TOP was informed that the remainder of the evaluation will be completed by another provider, this initial triage assessment does not replace that evaluation, and the importance of remaining in the ED until their evaluation is complete.     Marquette Old, PA-C 09/29/21 2119

## 2021-09-29 NOTE — ED Triage Notes (Addendum)
Patient to ED for generalized weakness with headache and pain bilateral lower abd pain. Patient denies urinary difficulties but states her "vagina feels sticky." Overall just not feeling well. Patient states she also has intermittent heads and ringing in her ears "like my ear is stopped up."

## 2021-09-29 NOTE — ED Notes (Signed)
Pt brought to ED rm 9 at this time, this RN now assuming care. 

## 2021-09-29 NOTE — ED Notes (Signed)
Pt taken to radiology

## 2021-10-01 LAB — URINE CULTURE

## 2021-10-04 ENCOUNTER — Encounter: Payer: Self-pay | Admitting: Student in an Organized Health Care Education/Training Program

## 2021-10-04 ENCOUNTER — Telehealth: Payer: Self-pay

## 2021-10-04 ENCOUNTER — Ambulatory Visit
Payer: Medicare Other | Attending: Student in an Organized Health Care Education/Training Program | Admitting: Student in an Organized Health Care Education/Training Program

## 2021-10-04 VITALS — BP 153/74 | HR 48 | Temp 97.2°F | Resp 16 | Ht 65.0 in | Wt 222.0 lb

## 2021-10-04 DIAGNOSIS — M5416 Radiculopathy, lumbar region: Secondary | ICD-10-CM

## 2021-10-04 DIAGNOSIS — G8929 Other chronic pain: Secondary | ICD-10-CM | POA: Diagnosis present

## 2021-10-04 DIAGNOSIS — M961 Postlaminectomy syndrome, not elsewhere classified: Secondary | ICD-10-CM | POA: Diagnosis present

## 2021-10-04 DIAGNOSIS — Z981 Arthrodesis status: Secondary | ICD-10-CM

## 2021-10-04 DIAGNOSIS — G894 Chronic pain syndrome: Secondary | ICD-10-CM | POA: Diagnosis present

## 2021-10-04 DIAGNOSIS — M7918 Myalgia, other site: Secondary | ICD-10-CM | POA: Diagnosis present

## 2021-10-04 DIAGNOSIS — M48062 Spinal stenosis, lumbar region with neurogenic claudication: Secondary | ICD-10-CM | POA: Diagnosis present

## 2021-10-04 MED ORDER — MAGNESIUM 500 MG PO CAPS: 500.0000 mg | ORAL_CAPSULE | Freq: Every day | ORAL | 0 refills | Status: AC

## 2021-10-04 MED ORDER — HYDROCODONE-ACETAMINOPHEN 5-325 MG PO TABS: 1.0000 | ORAL_TABLET | Freq: Two times a day (BID) | ORAL | 0 refills | Status: AC | PRN

## 2021-10-04 NOTE — Telephone Encounter (Signed)
Discussed with the patient.  She states that she was able to see her PCP.  She did recommend to stay on Klonopin.  Madison Jennings denies any concern of continuing this medication.

## 2021-10-04 NOTE — Progress Notes (Signed)
Safety precautions to be maintained throughout the outpatient stay will include: orient to surroundings, keep bed in low position, maintain call bell within reach at all times, provide assistance with transfer out of bed and ambulation.  

## 2021-10-04 NOTE — Patient Instructions (Signed)
Does not need to stop Eliquis for TPI

## 2021-10-04 NOTE — Progress Notes (Signed)
PROVIDER NOTE: Information contained herein reflects review and annotations entered in association with encounter. Interpretation of such information and data should be left to medically-trained personnel. Information provided to patient can be located elsewhere in the medical record under "Patient Instructions". Document created using STT-dictation technology, any transcriptional errors that may result from process are unintentional.    Patient: Madison Jennings  Service Category: E/M  Provider: [name], MD  DOB: 08-Oct-1940  DOS: 10/04/2021  Referring Provider: [name], MD  MRN: [id number]  Specialty: Interventional Pain Management  PCP: [name], MD  Type: Established Patient  Setting: Ambulatory outpatient    Location: Office  Delivery: Face-to-face     HPI  Madison Jennings, a 81 y.o. 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 Madison Jennings primary complain today is Back Pain (lower)  Pain Assessment: Severity of Chronic pain is reported as a 7 /10. Location: Back Lower/states pain goes into ribs. Onset: More than a month ago. Quality: Aching. Timing: Constant. Modifying factor(s): Hydrocodone, lying down, heat. Vitals:  height is 5' 5"  (1.651 m) and weight is 222 lb (100.7 kg). Her temporal temperature is 97.2 F (36.2 C) (abnormal). Her blood pressure is 153/74 (abnormal) and her pulse is 48 (abnormal). Her respiration is 16 and oxygen saturation is 99%.   Reason for encounter: evaluation of worsening, or previously known (established) problem.  Madison Jennings is a pleasant 81 year old female who presents today accompanied by her husband with increased lumbar spine pain.  She has a history of lumbar spinal fusion. She is having aches in her mid spine.  They usually radiate up to her inferior intercostal border.  It is worse when she is standing. Seems musculoskeletal in nature.  We discussed trigger point injections of the  multifidus, latissimus dorsi, erector spinae. Patient's last hydrocodone prescription was in November.  She takes this medication very sparingly.  She is requesting a refill. I also recommend magnesium for muscle cramps that she is experiencing in her mid spine.  I encouraged adequate water intake.  Pharmacotherapy Assessment  Analgesic:  12/28/2020  12/28/2020   1  Hydrocodone-Acetamin 5-325 Mg 90.00  30  Bi Lat  [id number]   Har ([id number])  0/0  15.00 MME  Comm Ins  [place]     Monitoring: [place] PMP: PDMP reviewed during this encounter.       Pharmacotherapy: No side-effects or adverse reactions reported. Compliance: No problems identified. Effectiveness: Clinically acceptable.  Madison Martins, RN  10/04/2021  8:12 AM  Sign when Signing Visit Safety precautions to be maintained throughout the outpatient stay will include: orient to surroundings, keep bed in low position, maintain call bell within reach at all times, provide assistance with transfer out of bed and ambulation.   UDS:  Summary  Date Value Ref Range Status  11/30/2020 Note  Final    Comment:    ==================================================================== Compliance Drug Analysis, Ur ==================================================================== Test                             Result       Flag       Units  Drug Present and Declared for Prescription Verification   Desmethyldiazepam              349          EXPECTED   ng/mg creat   Oxazepam  1247         EXPECTED   ng/mg creat   Temazepam                      996          EXPECTED   ng/mg creat    Desmethyldiazepam, oxazepam, and temazepam are expected metabolites    of diazepam. Desmethyldiazepam and oxazepam are also expected    metabolites of other drugs, including chlordiazepoxide, prazepam,    clorazepate, and halazepam. Oxazepam is an expected metabolite of    temazepam. Oxazepam and temazepam are also available as scheduled     prescription medications.    Bupropion                      PRESENT      EXPECTED   Hydroxybupropion               PRESENT      EXPECTED    Hydroxybupropion is an expected metabolite of bupropion.    Acetaminophen                  PRESENT      EXPECTED   Hydroxyzine                    PRESENT      EXPECTED  Drug Present not Declared for Prescription Verification   Norhydrocodone                 369          UNEXPECTED ng/mg creat    Norhydrocodone is an expected metabolite of hydrocodone.    Gabapentin                     PRESENT      UNEXPECTED  Drug Absent but Declared for Prescription Verification   Buprenorphine                  Not Detected UNEXPECTED ng/mg creat    Transdermal buprenorphine, as indicated in the declared medication    list, is not always detected even when used as directed.    Ibuprofen                      Not Detected UNEXPECTED    Ibuprofen, as indicated in the declared medication list, is not    always detected even when used as directed.  ==================================================================== Test                      Result    Flag   Units      Ref Range   Creatinine              55               mg/dL      >=20 ==================================================================== Declared Medications:  The flagging and interpretation on this report are based on the  following declared medications.  Unexpected results may arise from  inaccuracies in the declared medications.   **Note: The testing scope of this panel includes these medications:   Bupropion  Diazepam (Valium)  Hydroxyzine (Atarax)   **Note: The testing scope of this panel does not include small to  moderate amounts of these reported medications:   Acetaminophen (Tylenol)  Buprenorphine Patch (BuTrans)  Ibuprofen (Advil)   **Note: The testing scope of this panel does not include the  following reported medications:   Amlodipine (Norvasc)  Esomeprazole (Nexium)   Levothyroxine (Synthroid)  Lisinopril (Zestril) ==================================================================== For clinical consultation, please call [phone]. ====================================================================      ROS  Constitutional: Denies any fever or chills Gastrointestinal: No reported hemesis, hematochezia, vomiting, or acute GI distress Musculoskeletal:  lumbar spine pain Neurological: No reported episodes of acute onset apraxia, aphasia, dysarthria, agnosia, amnesia, paralysis, loss of coordination, or loss of consciousness  Medication Review  HYDROcodone-acetaminophen, Hyoscyamine Sulfate SL, Magnesium, Vitamin D (Ergocalciferol), apixaban, cephALEXin, clonazePAM, dicyclomine, levothyroxine, lisinopril, nitroGLYCERIN, and pantoprazole  History Review  Allergy: Ms. Bevacqua has No Known Allergies. Drug: Ms. Rief  reports no history of drug use. Alcohol:  reports no history of alcohol use. Tobacco:  reports that she quit smoking about 35 years ago. Her smoking use included cigarettes. She smoked an average of 0.50 packs per day. She has never used smokeless tobacco. Social: Ms. Huron  reports that she quit smoking about 35 years ago. Her smoking use included cigarettes. She smoked an average of 0.50 packs per day. She has never used smokeless tobacco. She reports that she does not drink alcohol and does not use drugs. Medical:  has a past medical history of Anxiety, Arthritis, Asthma, Chronic back pain, Chronic back pain, Depression, Diastolic dysfunction, Diverticulosis, Dysrhythmia, Fibromyalgia, Gastritis, GERD (gastroesophageal reflux disease), Headache(784.0), Hyperlipidemia, Hypertension, Hypothyroidism, Ischemic colitis ([place]), Joint pain, Nocturia, Obesity, and Vitamin D deficiency. Surgical: Ms. Sterbenz  has a past surgical history that includes Abdominal hysterectomy; Cholecystectomy; Foot surgery (Left); radiation to thyroid; Colonoscopy;  Cardiac catheterization (04/13/2010); Spinal cord stimulator insertion (N/A, 07/16/2013); RIGHT/LEFT HEART CATH AND CORONARY ANGIOGRAPHY (Bilateral, 10/22/2016); Appendectomy; Coronary angioplasty; Esophagogastroduodenoscopy (06/12/2011); Back surgery; Colonoscopy with propofol (N/A, 01/21/2017); Esophagogastroduodenoscopy (egd) with propofol (N/A, 01/21/2017); Oophorectomy; Thoracic discectomy (Left, 04/24/2018); and Esophagogastroduodenoscopy (N/A, 08/09/2021). Family: family history includes Anxiety disorder in her mother.  Laboratory Chemistry Profile   Renal Lab Results  Component Value Date   BUN 13 09/29/2021   CREATININE 1.04 (H) 09/29/2021   GFRAA 60 (L) 11/11/2018   GFRNONAA 55 (L) 09/29/2021    Hepatic Lab Results  Component Value Date   AST 15 09/29/2021   ALT 16 09/29/2021   ALBUMIN 3.8 09/29/2021   ALKPHOS 96 09/29/2021   LIPASE 36 09/29/2021    Electrolytes Lab Results  Component Value Date   NA 141 09/29/2021   K 4.2 09/29/2021   CL 111 09/29/2021   CALCIUM 8.9 09/29/2021   MG 1.9 09/12/2012    Bone No results found for: "VD25OH", "VD125OH2TOT", "LP3790WI0", "XB3532DJ2", "25OHVITD1", "25OHVITD2", "25OHVITD3", "TESTOFREE", "TESTOSTERONE"  Inflammation (CRP: Acute Phase) (ESR: Chronic Phase) Lab Results  Component Value Date   LATICACIDVEN 2.7 ([place]) 11/08/2020         Note: Above Lab results reviewed.  Recent Imaging Review  CT Renal Stone Study CLINICAL DATA:  Lower abdominal and flank pain, dysuria headache  EXAM: CT ABDOMEN AND PELVIS WITHOUT CONTRAST  TECHNIQUE: Multidetector CT imaging of the abdomen and pelvis was performed following the standard protocol without IV contrast.  RADIATION DOSE REDUCTION: This exam was performed according to the departmental dose-optimization program which includes automated exposure control, adjustment of the mA and/or kV according to patient size and/or use of iterative reconstruction technique.  COMPARISON:   08/06/2021  FINDINGS: Lower chest: No acute abnormality. Dependent bibasilar scarring and or atelectasis.  Hepatobiliary: No focal liver abnormality is seen. Status post cholecystectomy. No biliary dilatation.  Pancreas: Unremarkable. No pancreatic ductal dilatation or surrounding inflammatory changes.  Spleen: Normal in size without significant abnormality.  Adrenals/Urinary Tract: Adrenal glands are unremarkable. Atrophic left kidney. The right kidney is normal, without renal calculi, solid lesion, or hydronephrosis. Bladder is unremarkable.  Stomach/Bowel: Stomach is within normal limits. Diverticulum of the descending duodenum. Appendix is not clearly visualized and may be surgically absent. No evidence of bowel wall thickening, distention, or inflammatory changes. Sigmoid diverticula.  Vascular/Lymphatic: Aortic atherosclerosis. No enlarged abdominal or pelvic lymph nodes.  Reproductive: Status post hysterectomy.  Other: No abdominal wall hernia or abnormality. No ascites.  Musculoskeletal: No acute or significant osseous findings.  IMPRESSION: 1. No acute noncontrast CT findings of the abdomen or pelvis. No evidence of urinary tract calculi or hydronephrosis. 2. Atrophic left kidney in keeping with prior obstructive, infectious, or ischemic insult. 3. Sigmoid diverticulosis without evidence of acute diverticulitis.  Aortic Atherosclerosis (ICD10-I70.0).  Electronically Signed   By: Delanna Ahmadi M.D.   On: 09/29/2021 21:11 CT Head Wo Contrast CLINICAL DATA:  Headache, new or worsening (Age >= 50y)  EXAM: CT HEAD WITHOUT CONTRAST  TECHNIQUE: Contiguous axial images were obtained from the base of the skull through the vertex without intravenous contrast.  RADIATION DOSE REDUCTION: This exam was performed according to the departmental dose-optimization program which includes automated exposure control, adjustment of the mA and/or kV according to patient  size and/or use of iterative reconstruction technique.  COMPARISON:  11/08/2020  FINDINGS: Brain: No acute intracranial abnormality. Specifically, no hemorrhage, hydrocephalus, mass lesion, acute infarction, or significant intracranial injury.  Vascular: No hyperdense vessel or unexpected calcification.  Skull: No acute calvarial abnormality.  Sinuses/Orbits: No acute findings  Other: None  IMPRESSION: No acute intracranial abnormality.  Electronically Signed   By: Rolm Baptise M.D.   On: 09/29/2021 19:15 Note: Reviewed        Physical Exam  General appearance: Well nourished, well developed, and well hydrated. In no apparent acute distress Mental status: Alert, oriented x 3 (person, place, & time)       Respiratory: No evidence of acute respiratory distress Eyes: PERLA Vitals: BP (!) 153/74   Pulse (!) 48   Temp (!) 97.2 F (36.2 C) (Temporal)   Resp 16   Ht 5' 5"  (1.651 m)   Wt 222 lb (100.7 kg)   SpO2 99%   BMI 36.94 kg/m  BMI: Estimated body mass index is 36.94 kg/m as calculated from the following:   Height as of this encounter: 5' 5"  (1.651 m).   Weight as of this encounter: 222 lb (100.7 kg). Ideal: Ideal body weight: 57 kg (125 lb 10.6 oz) Adjusted ideal body weight: 74.5 kg (164 lb 3.2 oz)  Thoracic Spine Area Exam  Skin & Axial Inspection: Well healed scar from previous spine surgery detected Alignment: Symmetrical Functional ROM: Pain restricted ROM Stability: No instability detected Muscle Tone/Strength: Functionally intact. No obvious neuro-muscular anomalies detected. Sensory (Neurological): Dermatomal pain pattern Muscle strength & Tone: No palpable anomalies Lumbar Spine Area Exam  Skin & Axial Inspection: Well healed scar from previous spine surgery detected Alignment: Symmetrical Functional ROM: Mechanically restricted ROM       Stability: No instability detected Muscle Tone/Strength: Functionally intact. No obvious neuro-muscular  anomalies detected. Sensory (Neurological): Dermatomal pain pattern   Gait & Posture Assessment  Ambulation: Limited Gait: Antalgic gait (limping) Posture: Difficulty standing up straight, due to pain  Lower Extremity Exam      Side: Right lower extremity   Side: Left lower extremity  Stability: No instability observed  Stability: No instability observed          Skin & Extremity Inspection: Skin color, temperature, and hair growth are WNL. No peripheral edema or cyanosis. No masses, redness, swelling, asymmetry, or associated skin lesions. No contractures.   Skin & Extremity Inspection: Skin color, temperature, and hair growth are WNL. No peripheral edema or cyanosis. No masses, redness, swelling, asymmetry, or associated skin lesions. No contractures.  Functional ROM: Pain restricted ROM                   Functional ROM: Pain restricted ROM                  Muscle Tone/Strength: Functionally intact. No obvious neuro-muscular anomalies detected.   Muscle Tone/Strength: Functionally intact. No obvious neuro-muscular anomalies detected.  Sensory (Neurological): Dermatomal pain pattern         Sensory (Neurological): Dermatomal pain pattern        DTR: Patellar: 0: absent Achilles: deferred today Plantar: deferred today   DTR: Patellar: 0: absent Achilles: deferred today Plantar: deferred today  Palpation: No palpable anomalies   Palpation: No palpable anomalies       Assessment   Diagnosis Status  1. Myofascial pain syndrome of lumbar spine   2. History of lumbar fusion   3. Failed back surgical syndrome   4. Spinal stenosis, lumbar region, with neurogenic claudication   5. Chronic radicular lumbar pain   6. Chronic pain syndrome    Having a Flare-up Persistent Persistent   Plan of Care    Madison Jennings has a current medication list which includes the following long-term medication(s): apixaban, [START ON 10/22/2021] clonazepam, dicyclomine, hyoscyamine sulfate  sl, and levothyroxine.  Pharmacotherapy (Medications Ordered): Meds ordered this encounter  Medications   HYDROcodone-acetaminophen (NORCO) 5-325 MG tablet    Sig: Take 1 tablet by mouth every 12 (twelve) hours as needed for moderate pain.    Dispense:  60 tablet    Refill:  0   Magnesium 500 MG CAPS    Sig: Take 1 capsule (500 mg total) by mouth at bedtime.    Dispense:  180 capsule    Refill:  0    Fill one day early if pharmacy is closed on scheduled refill date. May substitute for generic, or similar, if available.   Orders:  Orders Placed This Encounter  Procedures   TRIGGER POINT INJECTION    Standing Status:   Future    Standing Expiration Date:   01/04/2022    Scheduling Instructions:     Lumbar myofascial TPI    Order Specific Question:   Where will this procedure be performed?    Answer:   [facility]   Follow-up plan:   Return in about 4 days (around 10/08/2021) for Lumbar TPI (fluoro table), in clinic [facility].    Recent Visits No visits were found meeting these conditions. Showing recent visits within past 90 days and meeting all other requirements Today's Visits Date Type Provider Dept  10/04/21 Office Visit [name], MD [facility]  Showing today's visits and meeting all other requirements Future Appointments Date Type Provider Dept  10/10/21 Appointment [name], MD [facility]  Showing future appointments within next 90 days and meeting all other requirements  I discussed the assessment and treatment plan with the patient. The patient was provided an opportunity to ask questions and all were answered. The patient agreed with the plan and demonstrated an understanding of the  instructions.  Patient advised to call back or seek an in-person evaluation if the symptoms or condition worsens.  Duration of encounter:79mnutes.  Total time on encounter, as per AMA guidelines included both the face-to-face and non-face-to-face time  personally spent by the physician and/or other qualified health care professional(s) on the day of the encounter (includes time in activities that require the physician or other qualified health care professional and does not include time in activities normally performed by clinical staff). Physician's time may include the following activities when performed: preparing to see the patient (eg, review of tests, pre-charting review of records) obtaining and/or reviewing separately obtained history performing a medically appropriate examination and/or evaluation counseling and educating the patient/family/caregiver ordering medications, tests, or procedures referring and communicating with other health care professionals (when not separately reported) documenting clinical information in the electronic or other health record independently interpreting results (not separately reported) and communicating results to the patient/ family/caregiver care coordination (not separately reported)  Note by: BGillis Santa MD Date: 10/04/2021; Time: 9:36 AM

## 2021-10-04 NOTE — Telephone Encounter (Signed)
pt called she states that she needs to speak with Dr. Modesta Messing about medication causing ringing in her ears. she states she went to her primary and was told that it maybe her medication she takes.

## 2021-10-10 ENCOUNTER — Ambulatory Visit
Payer: Medicare Other | Attending: Student in an Organized Health Care Education/Training Program | Admitting: Student in an Organized Health Care Education/Training Program

## 2021-10-10 ENCOUNTER — Encounter: Payer: Self-pay | Admitting: Student in an Organized Health Care Education/Training Program

## 2021-10-10 VITALS — BP 157/76 | Temp 97.2°F | Resp 21 | Ht 65.0 in | Wt 222.0 lb

## 2021-10-10 DIAGNOSIS — Z981 Arthrodesis status: Secondary | ICD-10-CM | POA: Diagnosis present

## 2021-10-10 DIAGNOSIS — M7918 Myalgia, other site: Secondary | ICD-10-CM | POA: Diagnosis present

## 2021-10-10 DIAGNOSIS — M961 Postlaminectomy syndrome, not elsewhere classified: Secondary | ICD-10-CM | POA: Diagnosis present

## 2021-10-10 MED ORDER — ROPIVACAINE HCL 2 MG/ML IJ SOLN
INTRAMUSCULAR | Status: AC
  Filled 2021-10-10: qty 20

## 2021-10-10 MED ORDER — ROPIVACAINE HCL 2 MG/ML IJ SOLN
9.0000 mL | Freq: Once | INTRAMUSCULAR | Status: AC
  Administered 2021-10-10: 9 mL via PERINEURAL

## 2021-10-10 NOTE — Progress Notes (Signed)
Upon d/c patient reports that he did not inject the area that was giving her the most pain which is thoracic.  Communicated with BL and he asked for me to mark the areas and get her back on table so that he can inject those areas.

## 2021-10-10 NOTE — Progress Notes (Signed)
Safety precautions to be maintained throughout the outpatient stay will include: orient to surroundings, keep bed in low position, maintain call bell within reach at all times, provide assistance with transfer out of bed and ambulation.  

## 2021-10-10 NOTE — Progress Notes (Signed)
PROVIDER NOTE: Interpretation of information contained herein should be left to medically-trained personnel. Specific patient instructions are provided elsewhere under "Patient Instructions" section of medical record. This document was created in part using STT-dictation technology, any transcriptional errors that may result from this process are unintentional.  Patient: Madison Jennings Type: Established DOB: 25-Nov-1940 MRN: [id number] PCP: Madison Lighter, MD  Service: Procedure DOS: 10/10/2021 Setting: Ambulatory Location: Ambulatory outpatient facility Delivery: Face-to-face Provider: [name], MD Specialty: Interventional Pain Management Specialty designation: 09 Location: Outpatient facility Ref. Prov.: Madison Lighter, MD    Primary Reason for Visit: Interventional Pain Management Treatment. CC: Back Pain    Procedure:          Anesthesia, Analgesia, Anxiolysis:  Trigger point injections of the thoracic and lumbar paraspinal region involving the erector spinae, latissimus dorsi, and longissimus     1. Myofascial pain syndrome of lumbar spine   2. History of lumbar fusion   3. Failed back surgical syndrome    NAS-11 Pain score:   Pre-procedure: 8 /10   Post-procedure: 0-No pain (pain is much better.)/10    Pre-op H&P Assessment:  Madison Jennings is a 81 y.o. (year old), female patient, seen today for interventional treatment. She  has a past surgical history that includes Abdominal hysterectomy; Cholecystectomy; Foot surgery (Left); radiation to thyroid; Colonoscopy; Cardiac catheterization (04/13/2010); Spinal cord stimulator insertion (N/A, 07/16/2013); RIGHT/LEFT HEART CATH AND CORONARY ANGIOGRAPHY (Bilateral, 10/22/2016); Appendectomy; Coronary angioplasty; Esophagogastroduodenoscopy (06/12/2011); Back surgery; Colonoscopy with propofol (N/A, 01/21/2017); Esophagogastroduodenoscopy (egd) with propofol (N/A, 01/21/2017); Oophorectomy; Thoracic discectomy (Left, 04/24/2018);  and Esophagogastroduodenoscopy (N/A, 08/09/2021). Madison Jennings has a current medication list which includes the following prescription(s): [START ON 10/22/2021] clonazepam, dicyclomine, hydrocodone-acetaminophen, hyoscyamine sulfate sl, levothyroxine, lisinopril, magnesium, nitroglycerin, pantoprazole, vitamin d (ergocalciferol), and apixaban. Her primarily concern today is the Back Pain  Initial Vital Signs:  Pulse/HCG Rate:   ECG Heart Rate: (!) 57 Temp: (!) 97.2 F (36.2 C) Resp: 16 BP: (!) 154/70 SpO2: 95 %  BMI: Estimated body mass index is 36.94 kg/m as calculated from the following:   Height as of this encounter: '5\' 5"'$  (1.651 m).   Weight as of this encounter: 222 lb (100.7 kg).  Risk Assessment: Allergies: Reviewed. She has No Known Allergies.  Allergy Precautions: None required Coagulopathies: Reviewed. None identified.  Blood-thinner therapy: None at this time Active Infection(s): Reviewed. None identified. Madison Jennings is afebrile  Site Confirmation: Madison Jennings was asked to confirm the procedure and laterality before marking the site Procedure checklist: Completed Consent: Before the procedure and under the influence of no sedative(s), amnesic(s), or anxiolytics, the patient was informed of the treatment options, risks and possible complications. To fulfill our ethical and legal obligations, as recommended by the American Medical Association's Code of Ethics, I have informed the patient of my clinical impression; the nature and purpose of the treatment or procedure; the risks, benefits, and possible complications of the intervention; the alternatives, including doing nothing; the risk(s) and benefit(s) of the alternative treatment(s) or procedure(s); and the risk(s) and benefit(s) of doing nothing. The patient was provided information about the general risks and possible complications associated with the procedure. These may include, but are not limited to: failure to achieve desired  goals, infection, bleeding, organ or nerve damage, allergic reactions, paralysis, and death. In addition, the patient was informed of those risks and complications associated to the procedure, such as failure to decrease pain; infection; bleeding; organ or nerve damage with subsequent damage to sensory, motor, and/or autonomic  systems, resulting in permanent pain, numbness, and/or weakness of one or several areas of the body; allergic reactions; (i.e.: anaphylactic reaction); and/or death. Furthermore, the patient was informed of those risks and complications associated with the medications. These include, but are not limited to: allergic reactions (i.e.: anaphylactic or anaphylactoid reaction(s)); adrenal axis suppression; blood sugar elevation that in diabetics may result in ketoacidosis or comma; water retention that in patients with history of congestive heart failure may result in shortness of breath, pulmonary edema, and decompensation with resultant heart failure; weight gain; swelling or edema; medication-induced neural toxicity; particulate matter embolism and blood vessel occlusion with resultant organ, and/or nervous system infarction; and/or aseptic necrosis of one or more joints. Finally, the patient was informed that Medicine is not an exact science; therefore, there is also the possibility of unforeseen or unpredictable risks and/or possible complications that may result in a catastrophic outcome. The patient indicated having understood very clearly. We have given the patient no guarantees and we have made no promises. Enough time was given to the patient to ask questions, all of which were answered to the patient's satisfaction. Madison Jennings has indicated that she wanted to continue with the procedure. Attestation: I, the ordering provider, attest that I have discussed with the patient the benefits, risks, side-effects, alternatives, likelihood of achieving goals, and potential problems during  recovery for the procedure that I have provided informed consent. Date  Time: 10/10/2021  8:56 AM  Pre-Procedure Preparation:  Monitoring: As per clinic protocol. Respiration, ETCO2, SpO2, BP, heart rate and rhythm monitor placed and checked for adequate function Safety Precautions: Patient was assessed for positional comfort and pressure points before starting the procedure. Time-out: I initiated and conducted the "Time-out" before starting the procedure, as per protocol. The patient was asked to participate by confirming the accuracy of the "Time Out" information. Verification of the correct person, site, and procedure were performed and confirmed by me, the nursing staff, and the patient. "Time-out" conducted as per Joint Commission's Universal Protocol (UP.01.01.01). Time: [phone] (1009)  Description of Procedure:          Area Prepped: Entire             Region DuraPrep (Iodine Povacrylex [0.7% available iodine] and Isopropyl Alcohol, 74% w/w) Safety Precautions: Aspiration looking for blood return was conducted prior to all injections. At no point did we inject any substances, as a needle was being advanced. No attempts were made at seeking any paresthesias. Safe injection practices and needle disposal techniques used. Medications properly checked for expiration dates. SDV (single dose vial) medications used. Description of the Procedure: Protocol guidelines were followed. The patient was placed in position over the fluoroscopy table. The target area was identified and the area prepped in the usual manner. Skin & deeper tissues infiltrated with local anesthetic. Appropriate amount of time allowed to pass for local anesthetics to take effect. The procedure needles were then advanced to the target area. Proper needle placement secured. Negative aspiration confirmed. Solution injected in intermittent fashion, asking for systemic symptoms every 0.5cc of injectate. The needles were then removed and the  area cleansed, making sure to leave some of the prepping solution back to take advantage of its long term bactericidal properties.  Vitals:   10/10/21 0948 10/10/21 0950 10/10/21 1000 10/10/21 1011  BP: 135/87 (!) 157/76    Resp: 15 (!) 21    Temp:      TempSrc:      SpO2: 95% 95% 95% 96%  Weight:      Height:        Start Time: 0947 (1009) hrs. End Time: 0951 (1010) hrs. Materials:    Approximately 12 trigger points injected with 0.5 to 1 cc of 0.2% ropivacaine with dry needling performed  Post-operative Assessment:  Post-procedure Vital Signs:  Pulse/HCG Rate:   (!) 51 Temp:  (!) 97.2 F (36.2 C) Resp:  (!) 21 BP:  (!) 157/76 SpO2: 96 %  EBL: None  Complications: No immediate post-treatment complications observed by team, or reported by patient.  Note: The patient tolerated the entire procedure well. A repeat set of vitals were taken after the procedure and the patient was kept under observation following institutional policy, for this type of procedure. Post-procedural neurological assessment was performed, showing return to baseline, prior to discharge. The patient was provided with post-procedure discharge instructions, including a section on how to identify potential problems. Should any problems arise concerning this procedure, the patient was given instructions to immediately contact us, at any time, without hesitation. In any case, we plan to contact the patient by telephone for a follow-up status report regarding this interventional procedure.  Comments:  No additional relevant information.  Plan of Care  Orders:  No orders of the defined types were placed in this encounter.    Medications ordered for procedure: Meds ordered this encounter  Medications   ropivacaine (PF) 2 mg/mL (0.2%) (NAROPIN) injection 9 mL   Medications administered: We administered ropivacaine (PF) 2 mg/mL (0.2%).  See the medical record for exact dosing, route, and time of  administration.  Follow-up plan:   Return in about 4 weeks (around 11/07/2021) for Post Procedure Evaluation, virtual.      Recent Visits Date Type Provider Dept  10/04/21 Office Visit [name], MD [facility]  Showing recent visits within past 90 days and meeting all other requirements Today's Visits Date Type Provider Dept  10/10/21 Procedure visit [name], MD [facility]  Showing today's visits and meeting all other requirements Future Appointments Date Type Provider Dept  11/07/21 Appointment [name], MD [facility]  Showing future appointments within next 90 days and meeting all other requirements  Disposition: Discharge home  Discharge (Date  Time): 10/10/2021; 1014 hrs.   Primary Care Physician: Madison Lighter, MD Location: [facility] Note by: [name], MD Date: 10/10/2021; Time: 11:59 AM  Disclaimer:  Medicine is not an exact science. The only guarantee in medicine is that nothing is guaranteed. It is important to note that the decision to proceed with this intervention was based on the information collected from the patient. The Data and conclusions were drawn from the patient's questionnaire, the interview, and the physical examination. Because the information was provided in large part by the patient, it cannot be guaranteed that it has not been purposely or unconsciously manipulated. Every effort has been made to obtain as much relevant data as possible for this evaluation. It is important to note that the conclusions that lead to this procedure are derived in large part from the available data. Always take into account that the treatment will also be dependent on availability of resources and existing treatment guidelines, considered by other Pain Management Practitioners as being common knowledge and practice, at the time of the intervention. For Medico-Legal purposes, it is also important to  point out that variation in procedural techniques and pharmacological choices are the acceptable norm. The indications, contraindications, technique, and results of the above procedure  should only be interpreted and judged by a Board-Certified Interventional Pain Specialist with extensive familiarity and expertise in the same exact procedure and technique.

## 2021-10-10 NOTE — Patient Instructions (Signed)

## 2021-10-22 ENCOUNTER — Telehealth: Payer: Self-pay

## 2021-10-22 NOTE — Telephone Encounter (Signed)
left message that pharmacy had rx on hold and i told them to go ahead and get it ready for her. Asked her to check with pharmacy later today.

## 2021-10-22 NOTE — Telephone Encounter (Signed)
pharmacy states that a rx was on hold.  asked them to go ahead and fill for patient.

## 2021-10-22 NOTE — Telephone Encounter (Signed)
pt called states she needs a refill on the clonazepam

## 2021-10-24 ENCOUNTER — Other Ambulatory Visit: Payer: Self-pay | Admitting: Family Medicine

## 2021-10-24 ENCOUNTER — Other Ambulatory Visit: Payer: Self-pay | Admitting: Internal Medicine

## 2021-10-24 ENCOUNTER — Ambulatory Visit
Admission: RE | Admit: 2021-10-24 | Discharge: 2021-10-24 | Disposition: A | Discharge status: Home / Self Care | Payer: Medicare Other | Source: Ambulatory Visit | Attending: Family Medicine | Admitting: Family Medicine

## 2021-10-24 DIAGNOSIS — R1084 Generalized abdominal pain: Secondary | ICD-10-CM

## 2021-10-24 DIAGNOSIS — R140 Abdominal distension (gaseous): Secondary | ICD-10-CM

## 2021-10-24 DIAGNOSIS — R197 Diarrhea, unspecified: Secondary | ICD-10-CM | POA: Diagnosis present

## 2021-10-24 DIAGNOSIS — R1031 Right lower quadrant pain: Secondary | ICD-10-CM | POA: Diagnosis present

## 2021-10-24 MED ORDER — IOHEXOL 300 MG/ML  SOLN
100.0000 mL | Freq: Once | INTRAMUSCULAR | Status: AC | PRN
  Administered 2021-10-24: 100 mL via INTRAVENOUS

## 2021-10-31 NOTE — Progress Notes (Signed)
[place] MD/PA/NP OP Progress Note  11/01/2021 9:12 AM RAYCHEL DOWLER  MRN:  [id number]  Chief Complaint:  Chief Complaint  Patient presents with  . Follow-up  . Medication Refill   HPI:  This is a follow-up appointment for depression and anxiety.  She states that she is not doing well.  Pain causes her depression and anxiety.  Other she was prescribed hydrocodone 5 mg twice a day, she has limited benefit from this.  Although she occasionally goes to church at times, it has been difficult for her to stand up.  She tries to find a way to do things better.  She does not think psychotherapy will be helpful.  She has good support from lots of friends, her son and her daughter.  She denies any palpitations or shortness of breath since the last visit.  She denies any dizziness or fall.  She feels comfortable to stay on Klonopin at this time.    Visit Diagnosis:    ICD-10-CM   1. GAD (generalized anxiety disorder)  F41.1     2. MDD (major depressive disorder), recurrent episode, mild ([place])  F33.0       Past Psychiatric History: Please see initial evaluation for full details. I have reviewed the history. No updates at this time.     Past Medical History:  Past Medical History:  Diagnosis Date  . Anxiety   . Arthritis   . Asthma   . Chronic back pain   . Chronic back pain    scoliosis and 2 buldging disc  . Depression   . Diastolic dysfunction   . Diverticulosis   . Dysrhythmia    PALPITATIONS  . Fibromyalgia   . Gastritis   . GERD (gastroesophageal reflux disease)    takes Protonix daily  . Headache(784.0)    sinus related  . Hyperlipidemia    just diagnosed;will follow up in 6wks to discuss meds  . Hypertension    takes Lisinopril daily  . Hypothyroidism    takes Synthroid daily  . Ischemic colitis ([place])   . Joint pain   . Nocturia   . Obesity   . Vitamin D deficiency    just placed on Vit D this week    Past Surgical History:  Procedure Laterality Date  . ABDOMINAL  HYSTERECTOMY    . APPENDECTOMY    . BACK SURGERY     x 3  . CARDIAC CATHETERIZATION  04/13/2010   normal cororanies, EF 55% ([facility]; Dr. Clayborn Bigness)  . CHOLECYSTECTOMY    . COLONOSCOPY    . COLONOSCOPY WITH PROPOFOL N/A 01/21/2017   Procedure: COLONOSCOPY WITH PROPOFOL;  Surgeon: Lollie Sails, MD;  Location: [facility] ENDOSCOPY;  Service: Endoscopy;  Laterality: N/A;  . CORONARY ANGIOPLASTY    . ESOPHAGOGASTRODUODENOSCOPY  06/12/2011  . ESOPHAGOGASTRODUODENOSCOPY N/A 08/09/2021   Procedure: ESOPHAGOGASTRODUODENOSCOPY (EGD);  Surgeon: Annamaria Helling, DO;  Location: [facility] ENDOSCOPY;  Service: Gastroenterology;  Laterality: N/A;  . ESOPHAGOGASTRODUODENOSCOPY (EGD) WITH PROPOFOL N/A 01/21/2017   Procedure: ESOPHAGOGASTRODUODENOSCOPY (EGD) WITH PROPOFOL;  Surgeon: Lollie Sails, MD;  Location: [facility] ENDOSCOPY;  Service: Endoscopy;  Laterality: N/A;  . FOOT SURGERY Left    d/t neuroma  . OOPHORECTOMY    . radiation to thyroid    . RIGHT/LEFT HEART CATH AND CORONARY ANGIOGRAPHY Bilateral 10/22/2016   Procedure: RIGHT/LEFT HEART CATH AND CORONARY ANGIOGRAPHY;  Surgeon: Yolonda Kida, MD;  Location: [place] CV LAB;  Service: Cardiovascular;  Laterality: Bilateral;  . SPINAL CORD STIMULATOR INSERTION  N/A 07/16/2013   Procedure: LUMBAR SPINAL CORD STIMULATOR INSERTION;  Surgeon: Bonna Gains, MD;  Location: [place];  Service: Neurosurgery;  Laterality: N/A;  . THORACIC DISCECTOMY Left 04/24/2018   Procedure: Left Thoracic ten- thoracic eleven Microdiscectomy, Removal of Spinal Cord Stimulator;  Surgeon: Kristeen Miss, MD;  Location: [place];  Service: Neurosurgery;  Laterality: Left;  Left Thoracic ten- thoracic eleven Microdiscectomy, Removal of Spinal Cord Stimulator    Family Psychiatric History: Please see initial evaluation for full details. I have reviewed the history. No updates at this time.     Family History:  Family History  Problem Relation Age of Onset  . Anxiety  disorder Mother   . Breast cancer Neg Hx     Social History:  Social History   Socioeconomic History  . Marital status: Married    Spouse name: Not on file  . Number of children: 4  . Years of education: Not on file  . Highest education level: Not on file  Occupational History  . Not on file  Tobacco Use  . Smoking status: Former    Packs/day: 0.50    Years: 0.00    Total pack years: 0.00    Types: Cigarettes    Quit date: 06/20/1986    Years since quitting: 35.3  . Smokeless tobacco: Never  Vaping Use  . Vaping Use: Never used  Substance and Sexual Activity  . Alcohol use: No  . Drug use: No  . Sexual activity: Yes  Other Topics Concern  . Not on file  Social History Narrative  . Not on file   Social Determinants of Health   Financial Resource Strain: Not on file  Food Insecurity: Not on file  Transportation Needs: Not on file  Physical Activity: Not on file  Stress: Not on file  Social Connections: Not on file    Allergies: No Known Allergies  Metabolic Disorder Labs: No results found for: "HGBA1C", "MPG" No results found for: "PROLACTIN" Lab Results  Component Value Date   CHOL 182 09/04/2012   TRIG 137 09/04/2012   HDL 63 (H) 09/04/2012   VLDL 27 09/04/2012   LDLCALC 92 09/04/2012   Lab Results  Component Value Date   TSH 3.060 09/29/2021   TSH 3.872 10/18/2020    Therapeutic Level Labs: No results found for: "LITHIUM" No results found for: "VALPROATE" No results found for: "CBMZ"  Current Medications: Current Outpatient Medications  Medication Sig Dispense Refill  . amLODipine (NORVASC) 2.5 MG tablet Take 2.5 mg by mouth daily.    Marland Kitchen apixaban (ELIQUIS) 5 MG TABS tablet Take 2.5 mg by mouth 2 (two) times daily.    Marland Kitchen dicyclomine (BENTYL) 10 MG capsule Take by mouth.    Marland Kitchen HYDROcodone-acetaminophen (NORCO) 5-325 MG tablet Take 1 tablet by mouth every 12 (twelve) hours as needed for moderate pain. 60 tablet 0  . Hyoscyamine Sulfate SL 0.125 MG  SUBL Place under the tongue.    Marland Kitchen levothyroxine (SYNTHROID, LEVOTHROID) 75 MCG tablet Take 75 mcg by mouth daily before breakfast.    . lisinopril (ZESTRIL) 40 MG tablet Take 20 mg by mouth.    . Magnesium 500 MG CAPS Take 1 capsule (500 mg total) by mouth at bedtime. 180 capsule 0  . nitroGLYCERIN (NITROSTAT) 0.4 MG SL tablet Place under the tongue.    . pantoprazole (PROTONIX) 40 MG tablet Take by mouth.    . Vitamin D, Ergocalciferol, (DRISDOL) 1.25 MG (50000 UNIT) CAPS capsule Take 50,000 Units by  mouth once a week.    Derrill Memo ON 11/22/2021] clonazePAM (KLONOPIN) 0.5 MG tablet Take 1 tablet (0.5 mg total) by mouth 2 (two) times daily as needed for anxiety. 60 tablet 1   No current facility-administered medications for this visit.     Musculoskeletal: Strength & Muscle Tone: within normal limits Gait & Station: normal- uses a cane Patient leans: N/A  Psychiatric Specialty Exam: Review of Systems  Psychiatric/Behavioral:  Positive for dysphoric mood. Negative for agitation, behavioral problems, confusion, decreased concentration, hallucinations, self-injury, sleep disturbance and suicidal ideas. The patient is nervous/anxious. The patient is not hyperactive.   All other systems reviewed and are negative.   Blood pressure (!) 140/66, pulse 62, temperature 98.1 F (36.7 C), temperature source Temporal, weight 226 lb 3.2 oz (102.6 kg).Body mass index is 37.64 kg/m.  General Appearance: Fairly Groomed  Eye Contact:  Good  Speech:  Clear and Coherent  Volume:  Normal  Mood:  Depressed  Affect:  Appropriate, Congruent, and slightly down  Thought Process:  Coherent  Orientation:  Full (Time, Place, and Person)  Thought Content: Logical   Suicidal Thoughts:  No  Homicidal Thoughts:  No  Memory:  Immediate;   Good  Judgement:  Good  Insight:  Good  Psychomotor Activity:  Normal  Concentration:  Concentration: Good and Attention Span: Good  Recall:  Good  Fund of Knowledge: Good   Language: Good  Akathisia:  No  Handed:  Right  AIMS (if indicated): not done  Assets:  Communication Skills Desire for Improvement  ADL's:  Intact  Cognition: WNL  Sleep:  Fair   Screenings: GAD-7    Flowsheet Row Office Visit from 11/01/2021 in [place] Visit from 03/12/2021 in [place]  Total GAD-7 Score 8 12      PHQ2-9    [place] Visit from 11/01/2021 in [place] Office Visit from 10/04/2021 in [place] Office Visit from 09/25/2021 in [place] Office Visit from 08/30/2021 in [place] Office Visit from 08/02/2021 in [place]  PHQ-2 Total Score 2 0 '3 3 5  '$ PHQ-9 Total Score 9 -- '9 9 13      '$ [place] Office Visit from 11/01/2021 in [place] ED from 09/29/2021 in [place] Office Visit from 09/25/2021 in [place] No Risk No Risk No Risk        Assessment and Plan:  LOVELY KERINS is a 81 y.o. year old female with a history of anxiety,  hypertension, hypothyroidism, pAfib, bradycardia, reactive airway, GERD, fibromyalgia, Spinal stenosis of lumbar region without neurogenic claudication , who presents for follow up appointment for below.   1. GAD (generalized anxiety disorder) 2. MDD (major depressive disorder), recurrent episode, mild ([place]) Although she continues to report anxiety and depressive symptoms in the context of worsening in back pain, there has been overall improvement in other somatic symptoms such as palpitation and shortness of breath.  Will hold starting any antidepressant at this time due to her history of adverse reaction in the past.  Will continue clonazepam only as needed for anxiety.   Discussed risk of dependence, fall and over sedation, respiratory suppression especially with concomitant use of opioid.  Other she will greatly benefit from CBT/ACT, she declined a referral.    Plan Continue clonazepam 0.5 mg twice a  day as needed for anxiety Next appointment: 11/2 at 11 AM for 30 mins, in person - on Hydrocodone 5 mg twice a day   Past trials of medication: sertraline, bupropion (headache), duloxetine (limited benefit), venlafaxine (palpitation, wheezing according to the patient), viibryd, mirtazapine (dream, increase in appetite), Buspar (headache, migraine),  gabapentin (increased in appetite), pregbalin (some neuralga in her leg), valium      I have utilized the [place] Controlled Substances Reporting System (PMP AWARxE) to confirm adherence regarding the patient's medication. My review reveals appropriate prescription fills.   This clinician has discussed the side effect associated with medication prescribed during this encounter. Please refer to notes in the previous encounters for more details.      Collaboration of Care: Collaboration of Care: Other N/A  Patient/Guardian was advised Release of Information must be obtained prior to any record release in order to collaborate their care with an outside provider. Patient/Guardian was advised if they have not already done so to contact the registration department to sign all necessary forms in order for [place] to release information regarding their care.   Consent: Patient/Guardian gives verbal consent for treatment and assignment of benefits for services provided during this visit. Patient/Guardian expressed understanding and agreed to proceed.    Norman Clay, MD 11/01/2021, 9:12 AM

## 2021-11-01 ENCOUNTER — Ambulatory Visit (INDEPENDENT_AMBULATORY_CARE_PROVIDER_SITE_OTHER): Payer: Medicare Other | Admitting: Psychiatry

## 2021-11-01 ENCOUNTER — Encounter: Payer: Self-pay | Admitting: Psychiatry

## 2021-11-01 VITALS — BP 140/66 | HR 62 | Temp 98.1°F | Wt 226.2 lb

## 2021-11-01 DIAGNOSIS — F330 Major depressive disorder, recurrent, mild: Secondary | ICD-10-CM

## 2021-11-01 DIAGNOSIS — F419 Anxiety disorder, unspecified: Secondary | ICD-10-CM | POA: Insufficient documentation

## 2021-11-01 DIAGNOSIS — F411 Generalized anxiety disorder: Secondary | ICD-10-CM

## 2021-11-01 MED ORDER — CLONAZEPAM 0.5 MG PO TABS
0.5000 mg | ORAL_TABLET | Freq: Two times a day (BID) | ORAL | 1 refills | Status: DC | PRN
Start: 2021-11-22 — End: 2021-12-27

## 2021-11-07 ENCOUNTER — Ambulatory Visit
Payer: Medicare Other | Attending: Student in an Organized Health Care Education/Training Program | Admitting: Student in an Organized Health Care Education/Training Program

## 2021-11-07 ENCOUNTER — Encounter: Payer: Self-pay | Admitting: Student in an Organized Health Care Education/Training Program

## 2021-11-07 DIAGNOSIS — M961 Postlaminectomy syndrome, not elsewhere classified: Secondary | ICD-10-CM | POA: Diagnosis not present

## 2021-11-07 DIAGNOSIS — M7918 Myalgia, other site: Secondary | ICD-10-CM | POA: Diagnosis not present

## 2021-11-07 DIAGNOSIS — M5416 Radiculopathy, lumbar region: Secondary | ICD-10-CM

## 2021-11-07 DIAGNOSIS — Z981 Arthrodesis status: Secondary | ICD-10-CM | POA: Diagnosis not present

## 2021-11-07 DIAGNOSIS — G8929 Other chronic pain: Secondary | ICD-10-CM

## 2021-11-07 DIAGNOSIS — M48062 Spinal stenosis, lumbar region with neurogenic claudication: Secondary | ICD-10-CM

## 2021-11-07 MED ORDER — HYDROCODONE-ACETAMINOPHEN 7.5-325 MG PO TABS: 1.0000 | ORAL_TABLET | Freq: Two times a day (BID) | ORAL | 0 refills | Status: DC | PRN

## 2021-11-07 MED ORDER — GABAPENTIN 100 MG PO CAPS
ORAL_CAPSULE | ORAL | 0 refills | Status: DC
Start: 2021-11-07 — End: 2021-12-06

## 2021-11-07 NOTE — Progress Notes (Signed)
Patient: Madison Jennings  Service Category: E/M  Provider: [name], MD  DOB: 09/28/1940  DOS: 11/07/2021  Location: Office  MRN: [id number]  Setting: Ambulatory outpatient  Referring Provider: [name], MD  Type: Established Patient  Specialty: Interventional Pain Management  PCP: [name], MD  Location: Remote location  Delivery: TeleHealth     Virtual Encounter - Pain Management PROVIDER NOTE: Information contained herein reflects review and annotations entered in association with encounter. Interpretation of such information and data should be left to medically-trained personnel. Information provided to patient can be located elsewhere in the medical record under "Patient Instructions". Document created using STT-dictation technology, any transcriptional errors that may result from process are unintentional.    Contact & Pharmacy Preferred: [place]: [phone] (home) Mobile: [phone] (mobile) E-mail: No e-mail address on record  [facility] [id number] [place], [place] - [address] [place] [address] Phone: [phone] Fax: [phone]   Pre-screening  [name] offered "in-person" vs "virtual" encounter. She indicated preferring virtual for this encounter.   Reason COVID-19*  Social distancing based on CDC and AMA recommendations.   I contacted Madison Jennings on 11/07/2021 via telephone.      I clearly identified myself as [name], MD. I verified that I was speaking with the correct person using two identifiers (Name: [name], and date of birth: [birth year]).  Consent I sought verbal advanced consent from Madison Jennings for virtual visit interactions. I informed [name] of possible security and privacy concerns, risks, and limitations associated with providing "not-in-person" medical evaluation and management services. I also informed [name] of the availability of "in-person" appointments. Finally, I  informed her that there would be a charge for the virtual visit and that she could be  personally, fully or partially, financially responsible for it. Ms. Garro expressed understanding and agreed to proceed.   Historic Elements   Madison Jennings is a 81 y.o. year old, female patient evaluated today after our last contact on 10/10/2021. Ms. Tool  has a past medical history of Anxiety, Arthritis, Asthma, Chronic back pain, Chronic back pain, Depression, Diastolic dysfunction, Diverticulosis, Dysrhythmia, Fibromyalgia, Gastritis, GERD (gastroesophageal reflux disease), Headache(784.0), Hyperlipidemia, Hypertension, Hypothyroidism, Ischemic colitis ([place]), Joint pain, Nocturia, Obesity, and Vitamin D deficiency. She also  has a past surgical history that includes Abdominal hysterectomy; Cholecystectomy; Foot surgery (Left); radiation to thyroid; Colonoscopy; Cardiac catheterization (04/13/2010); Spinal cord stimulator insertion (N/A, 07/16/2013); RIGHT/LEFT HEART CATH AND CORONARY ANGIOGRAPHY (Bilateral, 10/22/2016); Appendectomy; Coronary angioplasty; Esophagogastroduodenoscopy (06/12/2011); Back surgery; Colonoscopy with propofol (N/A, 01/21/2017); Esophagogastroduodenoscopy (egd) with propofol (N/A, 01/21/2017); Oophorectomy; Thoracic discectomy (Left, 04/24/2018); and Esophagogastroduodenoscopy (N/A, 08/09/2021). [name] has a current medication list which includes the following prescription(s): amlodipine, apixaban, [START ON 11/22/2021] clonazepam, dicyclomine, gabapentin, hydrocodone-acetaminophen, hyoscyamine sulfate sl, levothyroxine, lisinopril, magnesium, nitroglycerin, pantoprazole, and vitamin d (ergocalciferol). She  reports that she quit smoking about 35 years ago. Her smoking use included cigarettes. She smoked an average of 0.50 packs per day. She has never used smokeless tobacco. She reports that she does not drink alcohol and does not use drugs. [name] has No Known Allergies.   HPI   Today, she is being contacted for both, medication management and a post-procedure assessment.  Unfortunately, no significant benefit with trigger point injections.  She is mild analgesic benefit with hydrocodone at 5 mg twice a day.  She is still struggling to manage her pain.  We discussed increasing hydrocodone to  7.5 mg twice a day to see if that results in better analgesic response.  Risk and benefits reviewed and patient would like to proceed.  I also recommend that she consider retrialing gabapentin again.   Pharmacotherapy Assessment  Analgesic: Hydrocodone 5 mg twice daily as needed, will increase to 7.5 mg twice daily as needed  Monitoring: [place] PMP: PDMP not reviewed this encounter.       Pharmacotherapy: No side-effects or adverse reactions reported. Compliance: No problems identified. Effectiveness: Clinically acceptable.  UDS:  Summary  Date Value Ref Range Status  11/30/2020 Note  Final    Comment:    ==================================================================== Compliance Drug Analysis, Ur ==================================================================== Test                             Result       Flag       Units  Drug Present and Declared for Prescription Verification   Desmethyldiazepam              349          EXPECTED   ng/mg creat   Oxazepam                       1247         EXPECTED   ng/mg creat   Temazepam                      996          EXPECTED   ng/mg creat    Desmethyldiazepam, oxazepam, and temazepam are expected metabolites    of diazepam. Desmethyldiazepam and oxazepam are also expected    metabolites of other drugs, including chlordiazepoxide, prazepam,    clorazepate, and halazepam. Oxazepam is an expected metabolite of    temazepam. Oxazepam and temazepam are also available as scheduled    prescription medications.    Bupropion                      PRESENT      EXPECTED   Hydroxybupropion               PRESENT      EXPECTED     Hydroxybupropion is an expected metabolite of bupropion.    Acetaminophen                  PRESENT      EXPECTED   Hydroxyzine                    PRESENT      EXPECTED  Drug Present not Declared for Prescription Verification   Norhydrocodone                 369          UNEXPECTED ng/mg creat    Norhydrocodone is an expected metabolite of hydrocodone.    Gabapentin                     PRESENT      UNEXPECTED  Drug Absent but Declared for Prescription Verification   Buprenorphine                  Not Detected UNEXPECTED ng/mg creat    Transdermal buprenorphine, as indicated in the declared medication    list, is not always detected even when used as directed.  Ibuprofen                      Not Detected UNEXPECTED    Ibuprofen, as indicated in the declared medication list, is not    always detected even when used as directed.  ==================================================================== Test                      Result    Flag   Units      Ref Range   Creatinine              55               mg/dL      >=20 ==================================================================== Declared Medications:  The flagging and interpretation on this report are based on the  following declared medications.  Unexpected results may arise from  inaccuracies in the declared medications.   **Note: The testing scope of this panel includes these medications:   Bupropion  Diazepam (Valium)  Hydroxyzine (Atarax)   **Note: The testing scope of this panel does not include small to  moderate amounts of these reported medications:   Acetaminophen (Tylenol)  Buprenorphine Patch (BuTrans)  Ibuprofen (Advil)   **Note: The testing scope of this panel does not include the  following reported medications:   Amlodipine (Norvasc)  Esomeprazole (Nexium)  Levothyroxine (Synthroid)  Lisinopril (Zestril) ==================================================================== For clinical  consultation, please call [phone]. ====================================================================       Laboratory Chemistry Profile   Renal Lab Results  Component Value Date   BUN 13 09/29/2021   CREATININE 1.04 (H) 09/29/2021   GFRAA 60 (L) 11/11/2018   GFRNONAA 55 (L) 09/29/2021    Hepatic Lab Results  Component Value Date   AST 15 09/29/2021   ALT 16 09/29/2021   ALBUMIN 3.8 09/29/2021   ALKPHOS 96 09/29/2021   LIPASE 36 09/29/2021    Electrolytes Lab Results  Component Value Date   NA 141 09/29/2021   K 4.2 09/29/2021   CL 111 09/29/2021   CALCIUM 8.9 09/29/2021   MG 1.9 09/12/2012    Bone No results found for: "VD25OH", "VD125OH2TOT", "JO8786VE7", "MC9470JG2", "25OHVITD1", "25OHVITD2", "25OHVITD3", "TESTOFREE", "TESTOSTERONE"  Inflammation (CRP: Acute Phase) (ESR: Chronic Phase) Lab Results  Component Value Date   LATICACIDVEN 2.7 ([place]) 11/08/2020         Note: Above Lab results reviewed.  Imaging  CT ABDOMEN PELVIS W CONTRAST CLINICAL DATA:  Right lower quadrant pain and diarrhea  EXAM: CT ABDOMEN AND PELVIS WITH CONTRAST  TECHNIQUE: Multidetector CT imaging of the abdomen and pelvis was performed using the standard protocol following bolus administration of intravenous contrast.  RADIATION DOSE REDUCTION: This exam was performed according to the departmental dose-optimization program which includes automated exposure control, adjustment of the mA and/or kV according to patient size and/or use of iterative reconstruction technique.  CONTRAST:  140m OMNIPAQUE IOHEXOL 300 MG/ML  SOLN  COMPARISON:  CT 09/29/2021  FINDINGS: Lower chest: Bibasilar hypoventilatory changes. No acute abnormality.  Hepatobiliary: No focal liver abnormality is seen. Prior cholecystectomy.  Pancreas: Unremarkable. No pancreatic ductal dilatation or surrounding inflammatory changes.  Spleen: Normal in size without focal abnormality.  Adrenals/Urinary  Tract: Adrenal glands are unremarkable. 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 Normal right kidney without hydronephrosis or nephrolithiasis. Subcentimeter simple right upper pole renal cyst which requires no follow-up imaging. The bladder is mildly distended.  Stomach/Bowel: The stomach is within normal limits. There is no evidence of bowel  obstruction.Prior appendectomy. Sigmoid diverticulosis. No acute diverticulitis.  Vascular/Lymphatic: Aorta iliac atherosclerosis. No AAA. No lymphadenopathy.  Reproductive: Prior hysterectomy.  Other: No abdominal wall hernia or abnormality. No abdominopelvic ascites.  Musculoskeletal: No acute osseous abnormality. No suspicious osseous lesion. Prior decompression and fusion from L4-S1. Multilevel degenerative changes of the spine. Chronic deformity of the right posterior iliac bone superiorly.  IMPRESSION: No acute findings in the abdomen or pelvis.  Chronic findings as described above.  Electronically Signed   By: Maurine Simmering M.D.   On: 10/24/2021 12:17  Assessment  The primary encounter diagnosis was Myofascial pain syndrome of lumbar spine. Diagnoses of History of lumbar fusion, Failed back surgical syndrome, Spinal stenosis, lumbar region, with neurogenic claudication, and Chronic radicular lumbar pain were also pertinent to this visit.  Plan of Care    Ms. CAITLAIN TWEED has a current medication list which includes the following long-term medication(s): apixaban, [START ON 11/22/2021] clonazepam, dicyclomine, gabapentin, hyoscyamine sulfate sl, and levothyroxine.  Pharmacotherapy (Medications Ordered): Meds ordered this encounter  Medications   HYDROcodone-acetaminophen (NORCO) 7.5-325 MG tablet    Sig: Take 1 tablet by mouth 2 (two) times daily as needed for severe pain. Must last 30 days.    Dispense:  60 tablet    Refill:  0    Chronic Pain: STOP Act (Not applicable) Fill 1 day early if closed on  refill date. Avoid benzodiazepines within 8 hours of opioids   gabapentin (NEURONTIN) 100 MG capsule    Sig: Take 1 capsule (100 mg total) by mouth at bedtime for 7 days, THEN 2 capsules (200 mg total) at bedtime for 7 days, THEN 3 capsules (300 mg total) at bedtime for 16 days. Follow written titration schedule.    Dispense:  69 capsule    Refill:  0    Fill one day early if pharmacy is closed on scheduled refill date. May substitute for generic if available.    Follow-up plan:   Return in about 29 days (around 12/06/2021) for Medication Management, in person.    I discussed the assessment and treatment plan with the patient. The patient was provided an opportunity to ask questions and all were answered. The patient agreed with the plan and demonstrated an understanding of the instructions.  Patient advised to call back or seek an in-person evaluation if the symptoms or condition worsens.  Duration of encounter: 40mnutes.  Note by: BGillis Santa MD Date: 11/07/2021; Time: 3:47 PM

## 2021-11-27 ENCOUNTER — Other Ambulatory Visit: Payer: Self-pay | Admitting: Student in an Organized Health Care Education/Training Program

## 2021-12-03 ENCOUNTER — Other Ambulatory Visit: Payer: Self-pay | Admitting: Internal Medicine

## 2021-12-03 DIAGNOSIS — Z1231 Encounter for screening mammogram for malignant neoplasm of breast: Secondary | ICD-10-CM

## 2021-12-06 ENCOUNTER — Ambulatory Visit
Payer: Medicare Other | Attending: Student in an Organized Health Care Education/Training Program | Admitting: Student in an Organized Health Care Education/Training Program

## 2021-12-06 ENCOUNTER — Encounter: Payer: Self-pay | Admitting: Student in an Organized Health Care Education/Training Program

## 2021-12-06 VITALS — BP 137/70 | HR 57 | Temp 97.7°F | Resp 18 | Ht 64.0 in | Wt 222.0 lb

## 2021-12-06 DIAGNOSIS — G8929 Other chronic pain: Secondary | ICD-10-CM | POA: Diagnosis present

## 2021-12-06 DIAGNOSIS — M961 Postlaminectomy syndrome, not elsewhere classified: Secondary | ICD-10-CM | POA: Insufficient documentation

## 2021-12-06 DIAGNOSIS — M48062 Spinal stenosis, lumbar region with neurogenic claudication: Secondary | ICD-10-CM | POA: Insufficient documentation

## 2021-12-06 DIAGNOSIS — M5416 Radiculopathy, lumbar region: Secondary | ICD-10-CM | POA: Insufficient documentation

## 2021-12-06 DIAGNOSIS — G894 Chronic pain syndrome: Secondary | ICD-10-CM | POA: Insufficient documentation

## 2021-12-06 DIAGNOSIS — Z981 Arthrodesis status: Secondary | ICD-10-CM | POA: Diagnosis not present

## 2021-12-06 DIAGNOSIS — M7918 Myalgia, other site: Secondary | ICD-10-CM | POA: Diagnosis present

## 2021-12-06 MED ORDER — HYDROCODONE-ACETAMINOPHEN 7.5-325 MG PO TABS: 1.0000 | ORAL_TABLET | Freq: Two times a day (BID) | ORAL | 0 refills | Status: AC | PRN

## 2021-12-06 NOTE — Progress Notes (Signed)
PROVIDER NOTE: Information contained herein reflects review and annotations entered in association with encounter. Interpretation of such information and data should be left to medically-trained personnel. Information provided to patient can be located elsewhere in the medical record under "Patient Instructions". Document created using STT-dictation technology, any transcriptional errors that may result from process are unintentional.    Patient: Madison Jennings  Service Category: E/M  Provider: [name], MD  DOB: 06/20/1940  DOS: 12/06/2021  Referring Provider: [name], MD  MRN: [id number]  Specialty: Interventional Pain Management  PCP: [name], MD  Type: Established Patient  Setting: Ambulatory outpatient    Location: Office  Delivery: Face-to-face     HPI  Ms. Madison Jennings, a 81 y.o. 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 Ms. Madison Jennings primary complain today is Back Pain (low)  Pain Assessment: Severity of Chronic pain is reported as a 7 /10. Location: Back Lower/sometimes radiates down leg. Onset: More than a month ago. Quality: Aching. Timing: Constant. Modifying factor(s): meds. Vitals:  height is 5' 4"  (1.626 m) and weight is 222 lb (100.7 kg). Her temperature is 97.7 F (36.5 C). Her blood pressure is 137/70 and her pulse is 57 (abnormal). Her respiration is 18 and oxygen saturation is 99%.   Reason for encounter: medication management.  No change in medical history since last visit.  Patient's pain is at baseline.  Patient continues multimodal pain regimen as prescribed.  States that it provides pain relief and improvement in functional status.   Pharmacotherapy Assessment  Analgesic:  12/28/2020  12/28/2020   1  Hydrocodone-Acetamin 5-325 Mg 90.00  30  Bi Lat  [id number]   Har ([id number])  0/0  15.00 MME  Comm Ins  [place]     Monitoring: Madison Jennings PMP: PDMP reviewed during this encounter.       Pharmacotherapy: No  side-effects or adverse reactions reported. Compliance: No problems identified. Effectiveness: Clinically acceptable.  Madison Shorter, RN  12/06/2021  8:58 AM  Sign when Signing Visit Nursing Pain Medication Assessment:  Safety precautions to be maintained throughout the outpatient stay will include: orient to surroundings, keep bed in low position, maintain call bell within reach at all times, provide assistance with transfer out of bed and ambulation.  Medication Inspection Compliance: Pill count conducted under aseptic conditions, in front of the patient. Neither the pills nor the bottle was removed from the patient's sight at any time. Once count was completed pills were immediately returned to the patient in their original bottle.  Medication: Hydrocodone/APAP Pill/Patch Count:  27 of 60 pills remain Pill/Patch Appearance: Markings consistent with prescribed medication Bottle Appearance: Standard pharmacy container. Clearly labeled. Filled Date: 09 / 13 / 2023 Last Medication intake:  Yesterday  UDS:  Summary  Date Value Ref Range Status  11/30/2020 Note  Final    Comment:    ==================================================================== Compliance Drug Analysis, Ur ==================================================================== Test                             Result       Flag       Units  Drug Present and Declared for Prescription Verification   Desmethyldiazepam              349          EXPECTED   ng/mg creat   Oxazepam  1247         EXPECTED   ng/mg creat   Temazepam                      996          EXPECTED   ng/mg creat    Desmethyldiazepam, oxazepam, and temazepam are expected metabolites    of diazepam. Desmethyldiazepam and oxazepam are also expected    metabolites of other drugs, including chlordiazepoxide, prazepam,    clorazepate, and halazepam. Oxazepam is an expected metabolite of    temazepam. Oxazepam and temazepam are also  available as scheduled    prescription medications.    Bupropion                      PRESENT      EXPECTED   Hydroxybupropion               PRESENT      EXPECTED    Hydroxybupropion is an expected metabolite of bupropion.    Acetaminophen                  PRESENT      EXPECTED   Hydroxyzine                    PRESENT      EXPECTED  Drug Present not Declared for Prescription Verification   Norhydrocodone                 369          UNEXPECTED ng/mg creat    Norhydrocodone is an expected metabolite of hydrocodone.    Gabapentin                     PRESENT      UNEXPECTED  Drug Absent but Declared for Prescription Verification   Buprenorphine                  Not Detected UNEXPECTED ng/mg creat    Transdermal buprenorphine, as indicated in the declared medication    list, is not always detected even when used as directed.    Ibuprofen                      Not Detected UNEXPECTED    Ibuprofen, as indicated in the declared medication list, is not    always detected even when used as directed.  ==================================================================== Test                      Result    Flag   Units      Ref Range   Creatinine              55               mg/dL      >=20 ==================================================================== Declared Medications:  The flagging and interpretation on this report are based on the  following declared medications.  Unexpected results may arise from  inaccuracies in the declared medications.   **Note: The testing scope of this panel includes these medications:   Bupropion  Diazepam (Valium)  Hydroxyzine (Atarax)   **Note: The testing scope of this panel does not include small to  moderate amounts of these reported medications:   Acetaminophen (Tylenol)  Buprenorphine Patch (BuTrans)  Ibuprofen (Advil)   **Note: The testing scope of this panel does not include the  following reported medications:   Amlodipine  (Norvasc)  Esomeprazole (Nexium)  Levothyroxine (Synthroid)  Lisinopril (Zestril) ==================================================================== For clinical consultation, please call [phone]. ====================================================================      ROS  Constitutional: Denies any fever or chills Gastrointestinal: No reported hemesis, hematochezia, vomiting, or acute GI distress Musculoskeletal:  lumbar spine pain Neurological: No reported episodes of acute onset apraxia, aphasia, dysarthria, agnosia, amnesia, paralysis, loss of coordination, or loss of consciousness  Medication Review  HYDROcodone-acetaminophen, Hyoscyamine Sulfate SL, Magnesium, Vitamin D (Ergocalciferol), amLODipine, apixaban, clonazePAM, dicyclomine, levothyroxine, lisinopril, nitroGLYCERIN, and pantoprazole  History Review  Allergy: Ms. Madison Jennings has No Known Allergies. Drug: Ms. Madison Jennings  reports no history of drug use. Alcohol:  reports no history of alcohol use. Tobacco:  reports that she quit smoking about 35 years ago. Her smoking use included cigarettes. She smoked an average of 0.50 packs per day. She has never used smokeless tobacco. Social: Ms. Madison Jennings  reports that she quit smoking about 35 years ago. Her smoking use included cigarettes. She smoked an average of 0.50 packs per day. She has never used smokeless tobacco. She reports that she does not drink alcohol and does not use drugs. Medical:  has a past medical history of Anxiety, Arthritis, Asthma, Chronic back pain, Chronic back pain, Depression, Diastolic dysfunction, Diverticulosis, Dysrhythmia, Fibromyalgia, Gastritis, GERD (gastroesophageal reflux disease), Headache(784.0), Hyperlipidemia, Hypertension, Hypothyroidism, Ischemic colitis ([place]), Joint pain, Nocturia, Obesity, and Vitamin D deficiency. Surgical: Ms. Madison Jennings  has a past surgical history that includes Abdominal hysterectomy; Cholecystectomy; Foot surgery (Left);  radiation to thyroid; Colonoscopy; Cardiac catheterization (04/13/2010); Spinal cord stimulator insertion (N/A, 07/16/2013); RIGHT/LEFT HEART CATH AND CORONARY ANGIOGRAPHY (Bilateral, 10/22/2016); Appendectomy; Coronary angioplasty; Esophagogastroduodenoscopy (06/12/2011); Back surgery; Colonoscopy with propofol (N/A, 01/21/2017); Esophagogastroduodenoscopy (egd) with propofol (N/A, 01/21/2017); Oophorectomy; Thoracic discectomy (Left, 04/24/2018); and Esophagogastroduodenoscopy (N/A, 08/09/2021). Family: family history includes Anxiety disorder in her mother.  Laboratory Chemistry Profile   Renal Lab Results  Component Value Date   BUN 13 09/29/2021   CREATININE 1.04 (H) 09/29/2021   GFRAA 60 (L) 11/11/2018   GFRNONAA 55 (L) 09/29/2021    Hepatic Lab Results  Component Value Date   AST 15 09/29/2021   ALT 16 09/29/2021   ALBUMIN 3.8 09/29/2021   ALKPHOS 96 09/29/2021   LIPASE 36 09/29/2021    Electrolytes Lab Results  Component Value Date   NA 141 09/29/2021   K 4.2 09/29/2021   CL 111 09/29/2021   CALCIUM 8.9 09/29/2021   MG 1.9 09/12/2012    Bone No results found for: "VD25OH", "VD125OH2TOT", "FU9323FT7", "DU2025KY7", "25OHVITD1", "25OHVITD2", "25OHVITD3", "TESTOFREE", "TESTOSTERONE"  Inflammation (CRP: Acute Phase) (ESR: Chronic Phase) Lab Results  Component Value Date   LATICACIDVEN 2.7 ([place]) 11/08/2020         Note: Above Lab results reviewed.  Recent Imaging Review  CT ABDOMEN PELVIS W CONTRAST CLINICAL DATA:  Right lower quadrant pain and diarrhea  EXAM: CT ABDOMEN AND PELVIS WITH CONTRAST  TECHNIQUE: Multidetector CT imaging of the abdomen and pelvis was performed using the standard protocol following bolus administration of intravenous contrast.  RADIATION DOSE REDUCTION: This exam was performed according to the departmental dose-optimization program which includes automated exposure control, adjustment of the mA and/or kV according to patient size  and/or use of iterative reconstruction technique.  CONTRAST:  16m OMNIPAQUE IOHEXOL 300 MG/ML  SOLN  COMPARISON:  CT 09/29/2021  FINDINGS: Lower chest: Bibasilar hypoventilatory changes. No acute abnormality.  Hepatobiliary: No focal liver abnormality is seen. Prior cholecystectomy.  Pancreas: Unremarkable. No pancreatic  ductal dilatation or surrounding inflammatory changes.  Spleen: Normal in size without focal abnormality.  Adrenals/Urinary Tract: Adrenal glands are unremarkable. 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 Normal right kidney without hydronephrosis or nephrolithiasis. Subcentimeter simple right upper pole renal cyst which requires no follow-up imaging. The bladder is mildly distended.  Stomach/Bowel: The stomach is within normal limits. There is no evidence of bowel obstruction.Prior appendectomy. Sigmoid diverticulosis. No acute diverticulitis.  Vascular/Lymphatic: Aorta iliac atherosclerosis. No AAA. No lymphadenopathy.  Reproductive: Prior hysterectomy.  Other: No abdominal wall hernia or abnormality. No abdominopelvic ascites.  Musculoskeletal: No acute osseous abnormality. No suspicious osseous lesion. Prior decompression and fusion from L4-S1. Multilevel degenerative changes of the spine. Chronic deformity of the right posterior iliac bone superiorly.  IMPRESSION: No acute findings in the abdomen or pelvis.  Chronic findings as described above.  Electronically Signed   By: Maurine Simmering M.D.   On: 10/24/2021 12:17 Note: Reviewed        Physical Exam  General appearance: Well nourished, well developed, and well hydrated. In no apparent acute distress Mental status: Alert, oriented x 3 (person, place, & time)       Respiratory: No evidence of acute respiratory distress Eyes: PERLA Vitals: BP 137/70   Pulse (!) 57   Temp 97.7 F (36.5 C)   Resp 18   Ht 5' 4"  (1.626 m)   Wt 222 lb (100.7 kg)   SpO2 99%   BMI 38.11  kg/m  BMI: Estimated body mass index is 38.11 kg/m as calculated from the following:   Height as of this encounter: 5' 4"  (1.626 m).   Weight as of this encounter: 222 lb (100.7 kg). Ideal: Ideal body weight: 54.7 kg (120 lb 9.5 oz) Adjusted ideal body weight: 73.1 kg (161 lb 2.5 oz)  Thoracic Spine Area Exam  Skin & Axial Inspection: Well healed scar from previous spine surgery detected Alignment: Symmetrical Functional ROM: Pain restricted ROM Stability: No instability detected Muscle Tone/Strength: Functionally intact. No obvious neuro-muscular anomalies detected. Sensory (Neurological): Dermatomal pain pattern Muscle strength & Tone: No palpable anomalies Lumbar Spine Area Exam  Skin & Axial Inspection: Well healed scar from previous spine surgery detected Alignment: Symmetrical Functional ROM: Mechanically restricted ROM       Stability: No instability detected Muscle Tone/Strength: Functionally intact. No obvious neuro-muscular anomalies detected. Sensory (Neurological): Dermatomal pain pattern   Gait & Posture Assessment  Ambulation: Limited Gait: Antalgic gait (limping) Posture: Difficulty standing up straight, due to pain  Lower Extremity Exam      Side: Right lower extremity   Side: Left lower extremity  Stability: No instability observed           Stability: No instability observed          Skin & Extremity Inspection: Skin color, temperature, and hair growth are WNL. No peripheral edema or cyanosis. No masses, redness, swelling, asymmetry, or associated skin lesions. No contractures.   Skin & Extremity Inspection: Skin color, temperature, and hair growth are WNL. No peripheral edema or cyanosis. No masses, redness, swelling, asymmetry, or associated skin lesions. No contractures.  Functional ROM: Pain restricted ROM                   Functional ROM: Pain restricted ROM                  Muscle Tone/Strength: Functionally intact. No obvious neuro-muscular anomalies  detected.   Muscle Tone/Strength: Functionally intact. No obvious neuro-muscular anomalies  detected.  Sensory (Neurological): Dermatomal pain pattern         Sensory (Neurological): Dermatomal pain pattern        DTR: Patellar: 0: absent Achilles: deferred today Plantar: deferred today   DTR: Patellar: 0: absent Achilles: deferred today Plantar: deferred today  Palpation: No palpable anomalies   Palpation: No palpable anomalies       Assessment   Diagnosis Status  1. Myofascial pain syndrome of lumbar spine   2. History of lumbar fusion   3. Failed back surgical syndrome   4. Spinal stenosis, lumbar region, with neurogenic claudication   5. Chronic radicular lumbar pain   6. Chronic pain syndrome    Persistent Persistent Persistent   Plan of Care    Ms. NAILANI FULL has a current medication list which includes the following long-term medication(s): apixaban, clonazepam, dicyclomine, hyoscyamine sulfate sl, and levothyroxine.  Pharmacotherapy (Medications Ordered): Meds ordered this encounter  Medications   HYDROcodone-acetaminophen (NORCO) 7.5-325 MG tablet    Sig: Take 1 tablet by mouth 2 (two) times daily as needed for severe pain. Must last 30 days.    Dispense:  60 tablet    Refill:  0    Chronic Pain: STOP Act (Not applicable) Fill 1 day early if closed on refill date. Avoid benzodiazepines within 8 hours of opioids   HYDROcodone-acetaminophen (NORCO) 7.5-325 MG tablet    Sig: Take 1 tablet by mouth 2 (two) times daily as needed for severe pain. Must last 30 days.    Dispense:  60 tablet    Refill:  0    Chronic Pain: STOP Act (Not applicable) Fill 1 day early if closed on refill date. Avoid benzodiazepines within 8 hours of opioids   HYDROcodone-acetaminophen (NORCO) 7.5-325 MG tablet    Sig: Take 1 tablet by mouth 2 (two) times daily as needed for severe pain. Must last 30 days.    Dispense:  60 tablet    Refill:  0    Chronic Pain: STOP Act (Not applicable)  Fill 1 day early if closed on refill date. Avoid benzodiazepines within 8 hours of opioids   Orders:  Orders Placed This Encounter  Procedures   ToxASSURE Select 13 (MW), Urine    Volume: 30 ml(s). Minimum 3 ml of urine is needed. Document temperature of fresh sample. Indications: Long term (current) use of opiate analgesic [id number])    Order Specific Question:   Release to patient    Answer:   Immediate   Follow-up plan:   Return in about 14 weeks (around 03/14/2022) for Medication Management, in person.    Recent Visits Date Type Provider Dept  11/07/21 Office Visit [name], MD [facility]  10/10/21 Procedure visit [name], MD [facility]  10/04/21 Office Visit [name], MD [facility]  Showing recent visits within past 90 days and meeting all other requirements Today's Visits Date Type Provider Dept  12/06/21 Office Visit [name], MD [facility]  Showing today's visits and meeting all other requirements Future Appointments Date Type Provider Dept  03/05/22 Appointment [name], MD [facility]  Showing future appointments within next 90 days and meeting all other requirements  I discussed the assessment and treatment plan with the patient. The patient was provided an opportunity to ask questions and all were answered. The patient agreed with the plan and demonstrated an understanding of the instructions.  Patient advised to call back or seek an in-person evaluation if the  symptoms or condition worsens.  Duration of encounter:18mnutes.  Total time on encounter, as per AMA guidelines included both the face-to-face and non-face-to-face time personally spent by the physician and/or other qualified health care professional(s) on the day of the encounter (includes time in activities that require the physician or other qualified health care professional and does not include time in activities normally  performed by clinical staff). Physician's time may include the following activities when performed: preparing to see the patient (eg, review of tests, pre-charting review of records) obtaining and/or reviewing separately obtained history performing a medically appropriate examination and/or evaluation counseling and educating the patient/family/caregiver ordering medications, tests, or procedures referring and communicating with other health care professionals (when not separately reported) documenting clinical information in the electronic or other health record independently interpreting results (not separately reported) and communicating results to the patient/ family/caregiver care coordination (not separately reported)  Note by: BGillis Santa MD Date: 12/06/2021; Time: 9:51 AM

## 2021-12-06 NOTE — Progress Notes (Signed)
Nursing Pain Medication Assessment:  Safety precautions to be maintained throughout the outpatient stay will include: orient to surroundings, keep bed in low position, maintain call bell within reach at all times, provide assistance with transfer out of bed and ambulation.  Medication Inspection Compliance: Pill count conducted under aseptic conditions, in front of the patient. Neither the pills nor the bottle was removed from the patient's sight at any time. Once count was completed pills were immediately returned to the patient in their original bottle.  Medication: Hydrocodone/APAP Pill/Patch Count:  27 of 60 pills remain Pill/Patch Appearance: Markings consistent with prescribed medication Bottle Appearance: Standard pharmacy container. Clearly labeled. Filled Date: 09 / 13 / 2023 Last Medication intake:  Yesterday

## 2021-12-08 LAB — TOXASSURE SELECT 13 (MW), URINE

## 2021-12-10 ENCOUNTER — Other Ambulatory Visit: Payer: Self-pay | Admitting: Family Medicine

## 2021-12-10 DIAGNOSIS — R300 Dysuria: Secondary | ICD-10-CM

## 2021-12-13 ENCOUNTER — Ambulatory Visit
Admission: RE | Admit: 2021-12-13 | Discharge: 2021-12-13 | Disposition: A | Discharge status: Home / Self Care | Payer: Medicare Other | Source: Ambulatory Visit | Attending: Family Medicine | Admitting: Family Medicine

## 2021-12-13 DIAGNOSIS — R300 Dysuria: Secondary | ICD-10-CM

## 2021-12-24 ENCOUNTER — Emergency Department (HOSPITAL_COMMUNITY): Payer: Medicare Other

## 2021-12-24 ENCOUNTER — Emergency Department (HOSPITAL_COMMUNITY)
Admission: EM | Admit: 2021-12-24 | Discharge: 2021-12-24 | Disposition: A | Discharge status: Home / Self Care | Payer: Medicare Other | Attending: Emergency Medicine | Admitting: Emergency Medicine

## 2021-12-24 ENCOUNTER — Encounter (HOSPITAL_COMMUNITY): Payer: Self-pay

## 2021-12-24 ENCOUNTER — Other Ambulatory Visit: Payer: Self-pay

## 2021-12-24 DIAGNOSIS — R001 Bradycardia, unspecified: Secondary | ICD-10-CM | POA: Diagnosis not present

## 2021-12-24 DIAGNOSIS — Z7901 Long term (current) use of anticoagulants: Secondary | ICD-10-CM | POA: Insufficient documentation

## 2021-12-24 DIAGNOSIS — Z79899 Other long term (current) drug therapy: Secondary | ICD-10-CM | POA: Diagnosis not present

## 2021-12-24 DIAGNOSIS — M25511 Pain in right shoulder: Secondary | ICD-10-CM | POA: Diagnosis not present

## 2021-12-24 DIAGNOSIS — I10 Essential (primary) hypertension: Secondary | ICD-10-CM | POA: Diagnosis not present

## 2021-12-24 DIAGNOSIS — E039 Hypothyroidism, unspecified: Secondary | ICD-10-CM | POA: Diagnosis not present

## 2021-12-24 DIAGNOSIS — N644 Mastodynia: Secondary | ICD-10-CM | POA: Insufficient documentation

## 2021-12-24 DIAGNOSIS — R9431 Abnormal electrocardiogram [ECG] [EKG]: Secondary | ICD-10-CM

## 2021-12-24 LAB — COMPREHENSIVE METABOLIC PANEL
ALT: 13 U/L (ref 0–44)
AST: 16 U/L (ref 15–41)
Albumin: 3.5 g/dL (ref 3.5–5.0)
Alkaline Phosphatase: 75 U/L (ref 38–126)
Anion gap: 12 (ref 5–15)
BUN: 10 mg/dL (ref 8–23)
CO2: 21 mmol/L — ABNORMAL LOW (ref 22–32)
Calcium: 9.8 mg/dL (ref 8.9–10.3)
Chloride: 109 mmol/L (ref 98–111)
Creatinine, Ser: 0.93 mg/dL (ref 0.44–1.00)
GFR, Estimated: >60 mL/min (ref >60)
Glucose, Bld: 121 mg/dL — ABNORMAL HIGH (ref 70–99)
Potassium: 3.4 mmol/L — ABNORMAL LOW (ref 3.5–5.1)
Sodium: 142 mmol/L (ref 135–145)
Total Bilirubin: 0.7 mg/dL (ref 0.3–1.2)
Total Protein: 6.3 g/dL — ABNORMAL LOW (ref 6.5–8.1)

## 2021-12-24 LAB — CBC WITH DIFFERENTIAL/PLATELET
Abs Immature Granulocytes: 0.02 10*3/uL (ref 0.00–0.07)
Basophils Absolute: 0 10*3/uL (ref 0.0–0.1)
Basophils Relative: 0 %
Eosinophils Absolute: 0.2 10*3/uL (ref 0.0–0.5)
Eosinophils Relative: 3 %
HCT: 40.4 % (ref 36.0–46.0)
Hemoglobin: 13.6 g/dL (ref 12.0–15.0)
Immature Granulocytes: 0 %
Lymphocytes Relative: 54 %
Lymphs Abs: 3.5 10*3/uL (ref 0.7–4.0)
MCH: 31.3 pg (ref 26.0–34.0)
MCHC: 33.7 g/dL (ref 30.0–36.0)
MCV: 92.9 fL (ref 80.0–100.0)
Monocytes Absolute: 0.7 10*3/uL (ref 0.1–1.0)
Monocytes Relative: 11 %
Neutro Abs: 2.1 10*3/uL (ref 1.7–7.7)
Neutrophils Relative %: 32 %
Platelets: 220 10*3/uL (ref 150–400)
RBC: 4.35 MIL/uL (ref 3.87–5.11)
RDW: 13.8 % (ref 11.5–15.5)
WBC: 6.6 10*3/uL (ref 4.0–10.5)
nRBC: 0 % (ref 0.0–0.2)

## 2021-12-24 LAB — CK: Total CK: 39 U/L (ref 38–234)

## 2021-12-24 LAB — TROPONIN I (HIGH SENSITIVITY): Troponin I (High Sensitivity): 7 ng/L (ref <18)

## 2021-12-24 LAB — MAGNESIUM: Magnesium: 2 mg/dL (ref 1.7–2.4)

## 2021-12-24 MED ORDER — POTASSIUM CHLORIDE CRYS ER 20 MEQ PO TBCR
40.0000 meq | EXTENDED_RELEASE_TABLET | Freq: Once | ORAL | Status: AC
  Administered 2021-12-24: 40 meq via ORAL
  Filled 2021-12-24: qty 2

## 2021-12-24 NOTE — Discharge Instructions (Addendum)
It is unclear what is causing your shoulder and breast pain.  Follow-up with your primary care physician.  Take your medicines as prescribed.  I do not think this is related, but on your EKG today you had abnormal interval called a prolonged QT.  Sometimes this can cause arrhythmias or cause you to have dizziness or pass out.  If you develop the symptoms she should call 911 or return to the ER.  Otherwise follow-up with your cardiologist.

## 2021-12-24 NOTE — ED Provider Triage Note (Signed)
Emergency Medicine Provider Triage Evaluation  Madison Jennings , a 81 y.o. female  was evaluated in triage.  Pt complains of Note Right-sided breast pain x1 week.  Painful with movement of the right shoulder.  No rashes.  No history of the same, no redness heat or chest pain noted.  Review of Systems  Positive: No obvious redness or rashes Negative: Fever  Physical Exam  BP (!) 154/54 (BP Location: Left Arm)   Pulse (!) 49   Temp 97.9 F (36.6 C)   Resp 16   Ht '5\' 4"'$  (1.626 m)   Wt 100.7 kg   SpO2 97%   BMI 38.11 kg/m  Gen:   Awake, no distress   Resp:  Normal effort  MSK:   Moves extremities without difficulty  Other:  No obvious rashes  Medical Decision Making  Medically screening exam initiated at 11:39 AM.  Appropriate orders placed.  Madison Jennings was informed that the remainder of the evaluation will be completed by another provider, this initial triage assessment does not replace that evaluation, and the importance of remaining in the ED until their evaluation is complete.  Work-up initiated   Margarita Mail, PA-C 12/24/21 1142

## 2021-12-24 NOTE — ED Provider Notes (Signed)
[facility] EMERGENCY DEPARTMENT Provider Note   CSN: [id number] Arrival date & time: 12/24/21  6195     History  Chief Complaint  Patient presents with   Back Pain    Madison Jennings is a 81 y.o. female.  HPI 81 year old female with a history of multiple comorbidities including chronic back pain, hypothyroidism, hypertension, and chronic abdominal discomfort presents with right shoulder pain and right breast pain.  Overall the symptoms started about 9 days ago.  Before that she felt symptoms on the left side.  No trauma associated with this.  It hurts to lay on that side and move her shoulder.  She has pain across her upper back/neck but no midline or bony neck pain.  She has chronic thoracic and lumbar pain.  She endorses some on and off chest tightness during these 9 days.  No significant shortness of breath.  No fevers,.  She has chronic abdominal pain and diarrhea associated with this but no new symptoms.  No new leg symptoms or weakness in any of her extremities.  No numbness.  She has taken hydrocodone occasionally but does not like how it makes her feel and so she does not take it often.  Home Medications Prior to Admission medications   Medication Sig Start Date End Date Taking? Authorizing Provider  amLODipine (NORVASC) 2.5 MG tablet Take 2.5 mg by mouth daily. 10/31/21   [provider]  apixaban (ELIQUIS) 5 MG TABS tablet Take 2.5 mg by mouth 2 (two) times daily. 07/16/21   [provider]  clonazePAM (KLONOPIN) 0.5 MG tablet Take 1 tablet (0.5 mg total) by mouth 2 (two) times daily as needed for anxiety. 11/22/21 01/21/22  Norman Clay, MD  dicyclomine (BENTYL) 10 MG capsule Take by mouth. 06/27/21   [provider]  HYDROcodone-acetaminophen (NORCO) 7.5-325 MG tablet Take 1 tablet by mouth 2 (two) times daily as needed for severe pain. Must last 30 days. 12/06/21 01/05/22  [name], MD  HYDROcodone-acetaminophen (NORCO) 7.5-325 MG  tablet Take 1 tablet by mouth 2 (two) times daily as needed for severe pain. Must last 30 days. 01/05/22 02/04/22  [name], MD  HYDROcodone-acetaminophen (NORCO) 7.5-325 MG tablet Take 1 tablet by mouth 2 (two) times daily as needed for severe pain. Must last 30 days. 02/04/22 03/06/22  [name], MD  Hyoscyamine Sulfate SL 0.125 MG SUBL Place under the tongue. 01/01/21   [provider]  levothyroxine (SYNTHROID, LEVOTHROID) 75 MCG tablet Take 75 mcg by mouth daily before breakfast.    [provider]  lisinopril (ZESTRIL) 40 MG tablet Take 20 mg by mouth. 06/04/21   [provider]  Magnesium 500 MG CAPS Take 1 capsule (500 mg total) by mouth at bedtime. 10/04/21 04/02/22  [name], MD  nitroGLYCERIN (NITROSTAT) 0.4 MG SL tablet Place under the tongue. 08/14/21   [provider]  pantoprazole (PROTONIX) 40 MG tablet Take by mouth. 07/26/21 07/26/22  [provider]  Vitamin D, Ergocalciferol, (DRISDOL) 1.25 MG (50000 UNIT) CAPS capsule Take 50,000 Units by mouth once a week. 08/15/21   [provider]      Allergies    Patient has no known allergies.    Review of Systems   Review of Systems  Constitutional:  Negative for fever.  Respiratory:  Positive for chest tightness.   Cardiovascular:  Positive for chest pain.  Gastrointestinal:  Positive for abdominal pain and diarrhea. Negative for vomiting.  Musculoskeletal:  Positive for arthralgias.  Neurological:  Negative for weakness and numbness.    Physical Exam Updated Vital Signs BP (!) 144/56 (BP Location: Left Arm)   Pulse (!) 52   Temp 98 F (36.7 C) (Oral)   Resp 15   Ht '5\' 4"'$  (1.626 m)   Wt 100.7 kg   SpO2 99%   BMI 38.11 kg/m  Physical Exam Vitals and nursing note reviewed. Exam conducted with a chaperone present.  Constitutional:      Appearance: She is well-developed.  HENT:     Head: Normocephalic and atraumatic.  Neck:     Comments: No significant  neck/trapezius discomfort Cardiovascular:     Rate and Rhythm: Regular rhythm. Bradycardia present.     Pulses:          Radial pulses are 2+ on the right side.     Heart sounds: Normal heart sounds.  Pulmonary:     Effort: Pulmonary effort is normal.     Breath sounds: Normal breath sounds.  Chest:  Breasts:    Right: Tenderness present. No swelling or skin change.     Comments: No obvious masses, fluctuance or rash/cellulitis to the right breast. On the lateral aspect she notes some mild tenderness Abdominal:     Palpations: Abdomen is soft.     Tenderness: There is no abdominal tenderness.  Musculoskeletal:     Right shoulder: Tenderness (mild) present. No swelling or deformity. Normal range of motion.     Cervical back: No rigidity. No spinous process tenderness.  Skin:    General: Skin is warm and dry.  Neurological:     Mental Status: She is alert.     ED Results / Procedures / Treatments   Labs (all labs ordered are listed, but only abnormal results are displayed) Labs Reviewed  COMPREHENSIVE METABOLIC PANEL  CBC WITH DIFFERENTIAL/PLATELET  MAGNESIUM  CK  TROPONIN I (HIGH SENSITIVITY)    EKG None  Radiology DG Thoracic Spine 2 View  Result Date: 12/24/2021 CLINICAL DATA:  Right breast pain.  Pain between shoulder blades. EXAM: THORACIC SPINE 2 VIEWS COMPARISON:  Chest and rib examination 12/24/2021 FINDINGS: Mild dextroscoliosis in the thoracic spine. The thoracic vertebral body heights are maintained. Again noted endplate changes and disc disease along the anterior aspect of T9-T10. Again noted are endplate changes along the anterior aspect of L1-L2. IMPRESSION: 1. No acute bone abnormality in the thoracic spine. 2. Mild scoliosis. 3. Chronic endplate changes and disc disease at T9-T10. Electronically Signed   By: Markus Daft M.D.   On: 12/24/2021 12:40   DG Ribs Unilateral W/Chest Right  Addendum Date: 12/24/2021   ADDENDUM REPORT: 12/24/2021 12:38 ADDENDUM:  Correction: The original dictation is intended for the thoracic spine exam from the same date COMPARISON:  Chest radiograph 08/10/2021 FINDINGS: Both lungs are clear. Heart size is normal. Atherosclerotic calcifications at the aortic arch. Negative for a pneumothorax. Both clavicles are intact. Negative for a displaced right rib fracture. IMPRESSION: No acute cardiopulmonary disease. Negative for displaced right rib fracture. Electronically Signed   By: Markus Daft M.D.   On: 12/24/2021 12:38   Result Date: 12/24/2021 CLINICAL DATA:  Right breast pain. EXAM: RIGHT RIBS AND CHEST - 3+ VIEW COMPARISON:  Chest and rib exam 12/24/2021 FINDINGS: Mild dextroscoliosis in the thoracic spine. The thoracic vertebral body heights are maintained. Again noted are endplate changes and disc disease along the anterior aspect of T9-T10. Again noted are endplate changes along the anterior aspect L1-L2. IMPRESSION: 1. No acute abnormality  in the thoracic spine. 2. Mild scoliosis. 3. Chronic endplate changes and disc disease at T9-T10. Electronically Signed: By: Markus Daft M.D. On: 12/24/2021 12:30   DG Shoulder Right  Result Date: 12/24/2021 CLINICAL DATA:  Right breast pain.  Pain between shoulder blades. EXAM: RIGHT SHOULDER - 2+ VIEW COMPARISON:  None Available. FINDINGS: Right shoulder is located without a fracture. Degenerative changes at the right [facility] joint. Atherosclerotic calcifications at the aortic arch. IMPRESSION: 1. No acute bone abnormality to the right shoulder. 2. Degenerative changes at the right [facility] joint. Electronically Signed   By: Markus Daft M.D.   On: 12/24/2021 12:32    Procedures Procedures    Medications Ordered in ED Medications - No data to display  ED Course/ Medical Decision Making/ A&P                           Medical Decision Making Amount and/or Complexity of Data Reviewed External Data Reviewed: notes. Labs: ordered.    Details: Slight hypokalemia but otherwise normal troponin,  magnesium, CK, CBC and WBC. Radiology: independent interpretation performed.    Details: No rib, shoulder or thoracic fractures ECG/medicine tests: ordered and independent interpretation performed.    Details: Nonspecific T waves unchanged for her Prolonged QTC  Risk Prescription drug management.   No significant electrolyte disturbance to account for symptoms.  Troponin is negative and I have pretty low suspicion this is ACS.  At this point, unclear what is causing her some she has declined a thing for pain.  Could be arthritis pain though she cannot take NSAIDs due to her blood thinner use.  At this point, she is neurovascular intact and appears stable for discharge home.  Of note, QTc is mildly prolonged, a little over 500 here, will need this rechecked and followed up as an outpatient but I specifically asked that she has not had any syncope or near syncope symptoms.  I do not think this is clinically significant tonight.  We will have her follow-up with her cardiologist.  Given return precautions.        Final Clinical Impression(s) / ED Diagnoses Final diagnoses:  None    Rx / DC Orders ED Discharge Orders     None         Sherwood Gambler, MD 12/24/21 2111

## 2021-12-24 NOTE — ED Triage Notes (Signed)
Complains of left breast pain, that now has moved to right breast.  Also complains of left arm pain right shoulder and left shoulder and back pain between her shoulder blades. Mild chest pain around breast. Denies SOB.

## 2021-12-26 ENCOUNTER — Telehealth: Payer: Self-pay | Admitting: Student in an Organized Health Care Education/Training Program

## 2021-12-26 NOTE — Progress Notes (Unsigned)
[place] MD/PA/NP OP Progress Note  12/27/2021 11:34 AM Madison Jennings  MRN:  [id number]  Chief Complaint:  Chief Complaint  Patient presents with   Follow-up   HPI:  - She was found to have QTc prolongation (HR 50, qtc 536 msec) This is a follow-up appointment for depression and anxiety.  She states that although she was prescribed opioid, she has not been taking it as much as she wishes that she is scared about the potential side effect by taking with Klonopin.  She takes Klonopin twice a day.  She usually takes it in the morning ahead of time as she cannot tell she started to feel anxious.  She also takes in the evening as she feels nervous and need to lay down, although she denies any tremors.  She denies any specific thoughts through her mind.  She has been able to go to church when she does not have severe back pain.  She play games on the phone on, read Bibles.  She tries not to watch TV as it has been discouraging.  She does not think therapy will be helpful.  Although she does not think she can lower the dose of clonazepam, she agrees to consider this so that she can have opioid for pain.  She has insomnia due to pain.  She has slight decrease in appetite.  She denies SI .She denies fall, dizziness.  She is aware of the upcoming appointment with cardiologist.    Functional Status Instrumental Activities of Daily Living (IADLs):  Madison Jennings is independent in the following: managing finances, medication, (driving) Requires assistance with the following: cooking  Activities of Daily Living (ADLs):  Madison Jennings is independent in the following: bathing and hygiene, feeding, continence, grooming and toileting, walking (cane)    Wt Readings from Last 3 Encounters:  12/27/21 223 lb 9.6 oz (101.4 kg)  12/24/21 222 lb (100.7 kg)  12/06/21 222 lb (100.7 kg)     Visit Diagnosis:    ICD-10-CM   1. GAD (generalized anxiety disorder)  F41.1     2. MDD (major depressive disorder), recurrent  episode, mild ([place])  F33.0       Past Psychiatric History: Please see initial evaluation for full details. I have reviewed the history. No updates at this time.     Past Medical History:  Past Medical History:  Diagnosis Date   Anxiety    Arthritis    Asthma    Chronic back pain    Chronic back pain    scoliosis and 2 buldging disc   Depression    Diastolic dysfunction    Diverticulosis    Dysrhythmia    PALPITATIONS   Fibromyalgia    Gastritis    GERD (gastroesophageal reflux disease)    takes Protonix daily   Headache(784.0)    sinus related   Hyperlipidemia    just diagnosed;will follow up in 6wks to discuss meds   Hypertension    takes Lisinopril daily   Hypothyroidism    takes Synthroid daily   Ischemic colitis ([place])    Joint pain    Nocturia    Obesity    Vitamin D deficiency    just placed on Vit D this week    Past Surgical History:  Procedure Laterality Date   ABDOMINAL HYSTERECTOMY     APPENDECTOMY     BACK SURGERY     x 3   CARDIAC CATHETERIZATION  04/13/2010   normal cororanies, EF 55% ([facility];  Dr. Clayborn Bigness)   CHOLECYSTECTOMY     COLONOSCOPY     COLONOSCOPY WITH PROPOFOL N/A 01/21/2017   Procedure: COLONOSCOPY WITH PROPOFOL;  Surgeon: Lollie Sails, MD;  Location: [facility] ENDOSCOPY;  Service: Endoscopy;  Laterality: N/A;   CORONARY ANGIOPLASTY     ESOPHAGOGASTRODUODENOSCOPY  06/12/2011   ESOPHAGOGASTRODUODENOSCOPY N/A 08/09/2021   Procedure: ESOPHAGOGASTRODUODENOSCOPY (EGD);  Surgeon: Annamaria Helling, DO;  Location: [facility] ENDOSCOPY;  Service: Gastroenterology;  Laterality: N/A;   ESOPHAGOGASTRODUODENOSCOPY (EGD) WITH PROPOFOL N/A 01/21/2017   Procedure: ESOPHAGOGASTRODUODENOSCOPY (EGD) WITH PROPOFOL;  Surgeon: Lollie Sails, MD;  Location: [facility] ENDOSCOPY;  Service: Endoscopy;  Laterality: N/A;   FOOT SURGERY Left    d/t neuroma   OOPHORECTOMY     radiation to thyroid     RIGHT/LEFT HEART CATH AND CORONARY ANGIOGRAPHY Bilateral  10/22/2016   Procedure: RIGHT/LEFT HEART CATH AND CORONARY ANGIOGRAPHY;  Surgeon: Yolonda Kida, MD;  Location: [place] CV LAB;  Service: Cardiovascular;  Laterality: Bilateral;   SPINAL CORD STIMULATOR INSERTION N/A 07/16/2013   Procedure: LUMBAR SPINAL CORD STIMULATOR INSERTION;  Surgeon: Bonna Gains, MD;  Location: [place];  Service: Neurosurgery;  Laterality: N/A;   THORACIC DISCECTOMY Left 04/24/2018   Procedure: Left Thoracic ten- thoracic eleven Microdiscectomy, Removal of Spinal Cord Stimulator;  Surgeon: Kristeen Miss, MD;  Location: [place];  Service: Neurosurgery;  Laterality: Left;  Left Thoracic ten- thoracic eleven Microdiscectomy, Removal of Spinal Cord Stimulator    Family Psychiatric History: Please see initial evaluation for full details. I have reviewed the history. No updates at this time.     Family History:  Family History  Problem Relation Age of Onset   Anxiety disorder Mother    Breast cancer Neg Hx     Social History:  Social History   Socioeconomic History   Marital status: Married    Spouse name: Not on file   Number of children: 4   Years of education: Not on file   Highest education level: Not on file  Occupational History   Not on file  Tobacco Use   Smoking status: Former    Packs/day: 0.50    Years: 0.00    Total pack years: 0.00    Types: Cigarettes    Quit date: 06/20/1986    Years since quitting: 35.5   Smokeless tobacco: Never  Vaping Use   Vaping Use: Never used  Substance and Sexual Activity   Alcohol use: No   Drug use: No   Sexual activity: Yes  Other Topics Concern   Not on file  Social History Narrative   Not on file   Social Determinants of Health   Financial Resource Strain: Not on file  Food Insecurity: Not on file  Transportation Needs: Not on file  Physical Activity: Not on file  Stress: Not on file  Social Connections: Not on file    Allergies: No Known Allergies  Metabolic Disorder Labs: No  results found for: "HGBA1C", "MPG" No results found for: "PROLACTIN" Lab Results  Component Value Date   CHOL 182 09/04/2012   TRIG 137 09/04/2012   HDL 63 (H) 09/04/2012   VLDL 27 09/04/2012   LDLCALC 92 09/04/2012   Lab Results  Component Value Date   TSH 3.060 09/29/2021   TSH 3.872 10/18/2020    Therapeutic Level Labs: No results found for: "LITHIUM" No results found for: "VALPROATE" No results found for: "CBMZ"  Current Medications: Current Outpatient Medications  Medication Sig Dispense Refill   acetaminophen (  TYLENOL) 325 MG tablet Take 650 mg by mouth every 6 (six) hours as needed for moderate pain.     apixaban (ELIQUIS) 5 MG TABS tablet Take 2.5 mg by mouth 2 (two) times daily.     Chlorphen-Pseudoephed-APAP (TYLENOL ALLERGY SINUS PO) Take 1 tablet by mouth daily as needed (allergies).     dicyclomine (BENTYL) 10 MG capsule Take 10 mg by mouth daily as needed for spasms.     HYDROcodone-acetaminophen (NORCO) 7.5-325 MG tablet Take 1 tablet by mouth 2 (two) times daily as needed for severe pain. Must last 30 days. 60 tablet 0   [START ON 01/05/2022] HYDROcodone-acetaminophen (NORCO) 7.5-325 MG tablet Take 1 tablet by mouth 2 (two) times daily as needed for severe pain. Must last 30 days. 60 tablet 0   [START ON 02/04/2022] HYDROcodone-acetaminophen (NORCO) 7.5-325 MG tablet Take 1 tablet by mouth 2 (two) times daily as needed for severe pain. Must last 30 days. 60 tablet 0   Hyoscyamine Sulfate SL 0.125 MG SUBL Place 0.125 mg under the tongue daily as needed (stomach pain).     levothyroxine (SYNTHROID, LEVOTHROID) 75 MCG tablet Take 75 mcg by mouth daily before breakfast.     lisinopril (ZESTRIL) 20 MG tablet Take 20 mg by mouth daily.     nitroGLYCERIN (NITROSTAT) 0.4 MG SL tablet Place 0.4 mg under the tongue every 5 (five) minutes as needed for chest pain.     OVER THE COUNTER MEDICATION Apply 1 Application topically daily as needed (pain). 1% cream/ointment for pain.      pantoprazole (PROTONIX) 40 MG tablet Take 40 mg by mouth daily.     Vitamin D, Ergocalciferol, (DRISDOL) 1.25 MG (50000 UNIT) CAPS capsule Take 50,000 Units by mouth once a week.     amLODipine (NORVASC) 2.5 MG tablet Take 2.5 mg by mouth daily. (Patient not taking: Reported on 12/27/2021)     [START ON 01/28/2022] clonazePAM (KLONOPIN) 0.5 MG tablet Take 1 tablet (0.5 mg total) by mouth 2 (two) times daily as needed for anxiety. 60 tablet 1   Magnesium 500 MG CAPS Take 1 capsule (500 mg total) by mouth at bedtime. (Patient not taking: Reported on 12/27/2021) 180 capsule 0   No current facility-administered medications for this visit.     Musculoskeletal: Strength & Muscle Tone: within normal limits Gait & Station: normal (uses a cane) Patient leans: N/A  Psychiatric Specialty Exam: Review of Systems  Psychiatric/Behavioral:  Positive for decreased concentration, dysphoric mood and sleep disturbance. Negative for agitation, behavioral problems, confusion, hallucinations, self-injury and suicidal ideas. The patient is nervous/anxious. The patient is not hyperactive.   All other systems reviewed and are negative.   Blood pressure 137/79, pulse 60, temperature 98.9 F (37.2 C), temperature source Oral, height '5\' 4"'$  (1.626 m), weight 223 lb 9.6 oz (101.4 kg).Body mass index is 38.38 kg/m.  General Appearance: Fairly Groomed  Eye Contact:  Good  Speech:  Clear and Coherent  Volume:  Normal  Mood:   not good  Affect:  Appropriate, Congruent, and concerned, but less anxious  Thought Process:  Coherent  Orientation:  Full (Time, Place, and Person)  Thought Content: Logical   Suicidal Thoughts:  No  Homicidal Thoughts:  No  Memory:  Immediate;   Good  Judgement:  Good  Insight:  Good  Psychomotor Activity:  Normal  Concentration:  Concentration: Good and Attention Span: Good  Recall:  Good  Fund of Knowledge: Good  Language: Good  Akathisia:  No  Handed:  Right  AIMS (if  indicated): not done  Assets:  Communication Skills Desire for Improvement  ADL's:  Intact  Cognition: WNL  Sleep:  Poor   Screenings: GAD-7    Flowsheet Row Office Visit from 12/27/2021 in [place] Office Visit from 11/01/2021 in [place] Office Visit from 03/12/2021 in [place]  Total GAD-7 Score '8 8 12      '$ PHQ2-9    [place] Office Visit from 12/27/2021 in [place] Office Visit from 12/06/2021 in [place] Office Visit from 11/01/2021 in [place] Office Visit from 10/04/2021 in [place] Office Visit from 09/25/2021 in [place]  PHQ-2 Total Score 4 0 2 0 3  PHQ-9 Total Score 9 -- 9 -- [place] Office Visit from 12/27/2021 in [place] ED from 12/24/2021 in [place] Office Visit from 11/01/2021 in [place]  C-SSRS RISK CATEGORY No Risk No Risk No Risk        Assessment and Plan:  Madison Jennings is a 81 y.o. year old female with a history of  anxiety,  hypertension, hypothyroidism, pAfib, bradycardia,  with history of QTc prolongation, reactive airway, GERD, fibromyalgia, Spinal stenosis of lumbar region without neurogenic claudication , who presents for follow up appointment for below.   1. GAD (generalized anxiety disorder) 2. MDD (major depressive disorder), recurrent episode, mild ([place]) Exam is notable for less rumination on anxiety,  pain and other physical symptoms since the last visit.  Will hold starting any antidepressant at this time due to her history of adverse reaction, and QTc prolongation.  Will continue clonazepam as needed for anxiety.  She was advised to try taking lower dose to  see if it is tolerable so that she can take opioid to help for pain.  Discussed again regarding the risk of fall, over sedation, respiratory suppression with concomitant use of opioid.  Although she will greatly benefit from CBT/ACT, she is not interested in this.     Plan Continue clonazepam 0.5 mg twice a day as needed for anxiety (she was advised to lower to 0.25 mg if able) Next appointment: 1/25 at 11 AM for 30 mins, in person - on Hydrocodone 5 mg twice a day   Past trials of medication: sertraline, bupropion (headache), duloxetine (limited benefit), venlafaxine (palpitation, wheezing according to the patient), viibryd, mirtazapine (dream, increase in appetite), Buspar (headache, migraine),  gabapentin (increased in appetite), pregbalin (some neuralgia in her leg), valium     I have utilized the [place] Controlled Substances Reporting System (PMP AWARxE) to confirm adherence regarding the patient's medication. My review reveals appropriate prescription fills.   This clinician has discussed the side effect associated with medication prescribed during this encounter. Please refer to notes in the previous encounters for more details.      Collaboration of Care: Collaboration of Care: Other reviewed notes in Epic  Patient/Guardian was advised Release of Information must be obtained prior to any record release in order to collaborate their care with an outside provider. Patient/Guardian was advised if they have not already done so to contact the registration department to sign all necessary forms in order for [place] to release information regarding their care.   Consent: Patient/Guardian gives verbal consent for treatment and assignment of benefits for services provided during  this visit. Patient/Guardian expressed understanding and agreed to proceed.    Norman Clay, MD 12/27/2021, 11:34 AM

## 2021-12-26 NOTE — Telephone Encounter (Signed)
Hydrocodone needs PA with insurance per  [facility] [phone] fax [phone]

## 2021-12-27 ENCOUNTER — Ambulatory Visit (INDEPENDENT_AMBULATORY_CARE_PROVIDER_SITE_OTHER): Payer: Medicare Other | Admitting: Psychiatry

## 2021-12-27 ENCOUNTER — Encounter: Payer: Self-pay | Admitting: Psychiatry

## 2021-12-27 ENCOUNTER — Telehealth: Payer: Self-pay | Admitting: Student in an Organized Health Care Education/Training Program

## 2021-12-27 VITALS — BP 137/79 | HR 60 | Temp 98.9°F | Ht 64.0 in | Wt 223.6 lb

## 2021-12-27 DIAGNOSIS — F330 Major depressive disorder, recurrent, mild: Secondary | ICD-10-CM

## 2021-12-27 DIAGNOSIS — F411 Generalized anxiety disorder: Secondary | ICD-10-CM

## 2021-12-27 MED ORDER — CLONAZEPAM 0.5 MG PO TABS: 0.5000 mg | ORAL_TABLET | Freq: Two times a day (BID) | ORAL | 1 refills | Status: DC | PRN

## 2021-12-27 NOTE — Telephone Encounter (Signed)
PT stated that the insurance will only give her 7 day supply of the hydrocodone. Until they know that she can take the medication. PT stated that she has been having problems with [employer]. PT stated that on yesterday she was told that she only needed an PA from doctor Holley Raring so she could get her medication filled. However today she was told different. Please give patient a call. Thanks

## 2021-12-27 NOTE — Patient Instructions (Signed)
Continue clonazepam 0.5 mg twice a day as needed for anxiety (lower to 0.25 mg if able) Next appointment: 1/25 at 11 AM

## 2021-12-28 ENCOUNTER — Telehealth: Payer: Self-pay

## 2021-12-28 ENCOUNTER — Other Ambulatory Visit: Payer: Self-pay | Admitting: Psychiatry

## 2021-12-28 MED ORDER — CLONAZEPAM 0.5 MG PO TABS: 0.5000 mg | ORAL_TABLET | Freq: Two times a day (BID) | ORAL | 1 refills | Status: DC | PRN

## 2021-12-28 NOTE — Telephone Encounter (Signed)
Patient called to report that her Klonopin has been called in to the wrong pharmacy she is requesting that it be sent to the [facility] that is listed in her chart. Please advise.

## 2021-12-28 NOTE — Telephone Encounter (Signed)
Attempted to call patient to let her know that her Prior Authorization has been approved.

## 2021-12-28 NOTE — Telephone Encounter (Signed)
Sorry, Klonopin is ordered.

## 2022-01-03 ENCOUNTER — Other Ambulatory Visit: Payer: Self-pay | Admitting: Internal Medicine

## 2022-01-03 DIAGNOSIS — M79622 Pain in left upper arm: Secondary | ICD-10-CM

## 2022-01-24 ENCOUNTER — Ambulatory Visit
Admission: RE | Admit: 2022-01-24 | Discharge: 2022-01-24 | Disposition: A | Discharge status: Home / Self Care | Payer: Medicare Other | Source: Ambulatory Visit | Attending: Internal Medicine | Admitting: Internal Medicine

## 2022-01-24 DIAGNOSIS — M79622 Pain in left upper arm: Secondary | ICD-10-CM | POA: Diagnosis present

## 2022-01-24 DIAGNOSIS — N644 Mastodynia: Secondary | ICD-10-CM | POA: Insufficient documentation

## 2022-01-25 ENCOUNTER — Other Ambulatory Visit: Payer: Medicare Other

## 2022-01-31 ENCOUNTER — Other Ambulatory Visit (HOSPITAL_COMMUNITY): Payer: Self-pay | Admitting: Family Medicine

## 2022-01-31 ENCOUNTER — Ambulatory Visit (HOSPITAL_COMMUNITY)
Admission: RE | Admit: 2022-01-31 | Discharge: 2022-01-31 | Disposition: A | Discharge status: Home / Self Care | Payer: Medicare Other | Source: Ambulatory Visit | Attending: Family Medicine | Admitting: Family Medicine

## 2022-01-31 DIAGNOSIS — J069 Acute upper respiratory infection, unspecified: Secondary | ICD-10-CM | POA: Diagnosis present

## 2022-03-05 ENCOUNTER — Encounter: Payer: Medicare Other | Admitting: Student in an Organized Health Care Education/Training Program

## 2022-03-11 ENCOUNTER — Encounter: Payer: Self-pay | Admitting: Psychiatry

## 2022-03-11 ENCOUNTER — Ambulatory Visit: Payer: No Typology Code available for payment source | Admitting: Psychiatry

## 2022-03-11 VITALS — BP 108/72 | HR 89 | Temp 97.9°F

## 2022-03-11 DIAGNOSIS — F330 Major depressive disorder, recurrent, mild: Secondary | ICD-10-CM | POA: Diagnosis not present

## 2022-03-11 DIAGNOSIS — F411 Generalized anxiety disorder: Secondary | ICD-10-CM

## 2022-03-11 DIAGNOSIS — F1320 Sedative, hypnotic or anxiolytic dependence, uncomplicated: Secondary | ICD-10-CM | POA: Diagnosis not present

## 2022-03-11 MED ORDER — CLONAZEPAM 0.5 MG PO TABS: 0.5000 mg | ORAL_TABLET | Freq: Two times a day (BID) | ORAL | 1 refills | Status: AC | PRN

## 2022-03-11 MED ORDER — LORAZEPAM 0.5 MG PO TABS: 0.5000 mg | ORAL_TABLET | Freq: Two times a day (BID) | ORAL | 1 refills | Status: AC | PRN

## 2022-03-11 NOTE — Addendum Note (Signed)
Addended by: Norman Clay on: 03/11/2022 03:57 PM   Modules accepted: Orders

## 2022-03-11 NOTE — Progress Notes (Addendum)
BH MD/PA/NP OP Progress Note  03/11/2022 2:47 PM Madison Jennings  MRN:  [id number]  Chief Complaint:  Chief Complaint  Patient presents with   Follow-up   Fatigue   Insomnia   Panic Attack   HPI:  - diltiazem was started by her cardiologist.   This is a follow-up appointment for anxiety and depression.  She states that she is not doing well.  She has been taking clonazepam 0.25 mg twice a day, which she thought was advised.  She has been feeling weak, and not been able to do anything.  She has intense anxiety with panic attacks.  She has insomnia.  She denies any fall. She does not want to be on clonazepam due to this medication make her feel this way (she perseverates on this at least a few times during the visit). She was advised to be seen by PCP if any worsening in "weakness" as it can be due to other medical condition.) She denies SI.  Her daughter presents to the visit. She was loud and interrupted this Probation officer during the conversation. She is loud at times, although she was able to be redirected.  She states that Kebra has been doing worse. She used to be able to go out and do fishing. She does not do this anymore. She asks why she cannot be on medication such as sertraline.  She was informed that Serrita has tried those, and had adverse reaction. Her daughter has been taking lorazepam for many years, and she would like Kinya to try this.   Visit Diagnosis:    ICD-10-CM   1. GAD (generalized anxiety disorder)  F41.1     2. MDD (major depressive disorder), recurrent episode, mild ([place])  F33.0     3. Benzodiazepine dependence (HCC)  F13.20       Past Psychiatric History: Please see initial evaluation for full details. I have reviewed the history. No updates at this time.     Past Medical History:  Past Medical History:  Diagnosis Date   Anxiety    Arthritis    Asthma    Chronic back pain    Chronic back pain    scoliosis and 2 buldging disc   Depression    Diastolic  dysfunction    Diverticulosis    Dysrhythmia    PALPITATIONS   Fibromyalgia    Gastritis    GERD (gastroesophageal reflux disease)    takes Protonix daily   Headache(784.0)    sinus related   Hyperlipidemia    just diagnosed;will follow up in 6wks to discuss meds   Hypertension    takes Lisinopril daily   Hypothyroidism    takes Synthroid daily   Ischemic colitis ([place])    Joint pain    Nocturia    Obesity    Vitamin D deficiency    just placed on Vit D this week    Past Surgical History:  Procedure Laterality Date   ABDOMINAL HYSTERECTOMY     APPENDECTOMY     BACK SURGERY     x 3   CARDIAC CATHETERIZATION  04/13/2010   normal cororanies, EF 55% ([facility]; Dr. Clayborn Bigness)   CHOLECYSTECTOMY     COLONOSCOPY     COLONOSCOPY WITH PROPOFOL N/A 01/21/2017   Procedure: COLONOSCOPY WITH PROPOFOL;  Surgeon: Lollie Sails, MD;  Location: [facility] ENDOSCOPY;  Service: Endoscopy;  Laterality: N/A;   CORONARY ANGIOPLASTY     ESOPHAGOGASTRODUODENOSCOPY  06/12/2011   ESOPHAGOGASTRODUODENOSCOPY N/A 08/09/2021   Procedure: ESOPHAGOGASTRODUODENOSCOPY (  EGD);  Surgeon: Annamaria Helling, DO;  Location: [facility] ENDOSCOPY;  Service: Gastroenterology;  Laterality: N/A;   ESOPHAGOGASTRODUODENOSCOPY (EGD) WITH PROPOFOL N/A 01/21/2017   Procedure: ESOPHAGOGASTRODUODENOSCOPY (EGD) WITH PROPOFOL;  Surgeon: Lollie Sails, MD;  Location[facility] ENDOSCOPY;  Service: Endoscopy;  Laterality: N/A;   FOOT SURGERY Left    d/t neuroma   OOPHORECTOMY     radiation to thyroid     RIGHT/LEFT HEART CATH AND CORONARY ANGIOGRAPHY Bilateral 10/22/2016   Procedure: RIGHT/LEFT HEART CATH AND CORONARY ANGIOGRAPHY;  Surgeon: Yolonda Kida, MD;  Location: [facility] CV LAB;  Service: Cardiovascular;  Laterality: Bilateral;   SPINAL CORD STIMULATOR INSERTION N/A 07/16/2013   Procedure: LUMBAR SPINAL CORD STIMULATOR INSERTION;  Surgeon: Bonna Gains, MD;  Location: [place];  Service: Neurosurgery;  Laterality:  N/A;   THORACIC DISCECTOMY Left 04/24/2018   Procedure: Left Thoracic ten- thoracic eleven Microdiscectomy, Removal of Spinal Cord Stimulator;  Surgeon: Kristeen Miss, MD;  Location: [place];  Service: Neurosurgery;  Laterality: Left;  Left Thoracic ten- thoracic eleven Microdiscectomy, Removal of Spinal Cord Stimulator    Family Psychiatric History: Please see initial evaluation for full details. I have reviewed the history. No updates at this time.     Family History:  Family History  Problem Relation Age of Onset   Anxiety disorder Mother    Breast cancer Neg Hx     Social History:  Social History   Socioeconomic History   Marital status: Married    Spouse name: Not on file   Number of children: 4   Years of education: Not on file   Highest education level: Not on file  Occupational History   Not on file  Tobacco Use   Smoking status: Former    Packs/day: 0.50    Years: 0.00    Total pack years: 0.00    Types: Cigarettes    Quit date: 06/20/1986    Years since quitting: 35.7   Smokeless tobacco: Never  Vaping Use   Vaping Use: Never used  Substance and Sexual Activity   Alcohol use: No   Drug use: No   Sexual activity: Yes  Other Topics Concern   Not on file  Social History Narrative   Not on file   Social Determinants of Health   Financial Resource Strain: Not on file  Food Insecurity: Not on file  Transportation Needs: Not on file  Physical Activity: Not on file  Stress: Not on file  Social Connections: Not on file    Allergies: No Known Allergies  Metabolic Disorder Labs: No results found for: "HGBA1C", "MPG" No results found for: "PROLACTIN" Lab Results  Component Value Date   CHOL 182 09/04/2012   TRIG 137 09/04/2012   HDL 63 (H) 09/04/2012   VLDL 27 09/04/2012   LDLCALC 92 09/04/2012   Lab Results  Component Value Date   TSH 3.060 09/29/2021   TSH 3.872 10/18/2020    Therapeutic Level Labs: No results found for: "LITHIUM" No results  found for: "VALPROATE" No results found for: "CBMZ"  Current Medications: Current Outpatient Medications  Medication Sig Dispense Refill   acetaminophen (TYLENOL) 325 MG tablet Take 650 mg by mouth every 6 (six) hours as needed for moderate pain.     amLODipine (NORVASC) 2.5 MG tablet Take 2.5 mg by mouth daily.     apixaban (ELIQUIS) 5 MG TABS tablet Take 2.5 mg by mouth 2 (two) times daily.     Chlorphen-Pseudoephed-APAP (TYLENOL ALLERGY SINUS PO)  Take 1 tablet by mouth daily as needed (allergies).     clonazePAM (KLONOPIN) 0.5 MG tablet Take 1 tablet (0.5 mg total) by mouth 2 (two) times daily as needed for anxiety. 60 tablet 1   dicyclomine (BENTYL) 10 MG capsule Take 10 mg by mouth daily as needed for spasms.     Hyoscyamine Sulfate SL 0.125 MG SUBL Place 0.125 mg under the tongue daily as needed (stomach pain).     levothyroxine (SYNTHROID, LEVOTHROID) 75 MCG tablet Take 75 mcg by mouth daily before breakfast.     lisinopril (ZESTRIL) 20 MG tablet Take 20 mg by mouth daily.     LORazepam (ATIVAN) 0.5 MG tablet Take 1 tablet (0.5 mg total) by mouth 2 (two) times daily as needed for anxiety. 60 tablet 1   Magnesium 500 MG CAPS Take 1 capsule (500 mg total) by mouth at bedtime. 180 capsule 0   nitroGLYCERIN (NITROSTAT) 0.4 MG SL tablet Place 0.4 mg under the tongue every 5 (five) minutes as needed for chest pain.     pantoprazole (PROTONIX) 40 MG tablet Take 40 mg by mouth daily.     No current facility-administered medications for this visit.     Musculoskeletal: Strength & Muscle Tone: within normal limits Gait & Station: normal Patient leans: N/A  Psychiatric Specialty Exam: Review of Systems  Psychiatric/Behavioral:  Positive for decreased concentration, dysphoric mood and sleep disturbance. Negative for agitation, behavioral problems, confusion, hallucinations, self-injury and suicidal ideas. The patient is nervous/anxious. The patient is not hyperactive.   All other systems  reviewed and are negative.   Blood pressure 108/72, pulse 89, temperature 97.9 F (36.6 C), temperature source Temporal, SpO2 98 %.There is no height or weight on file to calculate BMI.  General Appearance: Fairly Groomed  Eye Contact:  Good  Speech:  Clear and Coherent  Volume:  Normal  Mood:  Anxious  Affect:  Appropriate, Congruent, and Tearful  Thought Process:  Coherent  Orientation:  Full (Time, Place, and Person)  Thought Content: Logical   Suicidal Thoughts:  No  Homicidal Thoughts:  No  Memory:  Immediate;   Good  Judgement:  Good  Insight:  Good  Psychomotor Activity:  Normal  Concentration:  Concentration: Good and Attention Span: Good  Recall:  Good  Fund of Knowledge: Good  Language: Good  Akathisia:  No  Handed:  Right  AIMS (if indicated): not done  Assets:  Communication Skills Desire for Improvement  ADL's:  Intact  Cognition: WNL  Sleep:  Poor   Screenings: GAD-7    Flowsheet Row Office Visit from 03/11/2022 in [place] Visit from 12/27/2021 in [place] Office Visit from 11/01/2021 in [place] Office Visit from 03/12/2021 in [place]  Total GAD-7 Score '19 8 8 12      '$ PHQ2-9    [place] Office Visit from 03/11/2022 in [facility] Office Visit from 12/27/2021 in [place] Office Visit from 12/06/2021 in [place] Office Visit from 11/01/2021 in [place] Office Visit from 10/04/2021 in [place]  PHQ-2 Total Score 5 4 0 2 0  PHQ-9 Total Score 18 9 -- 9 --      [place] Office Visit from 03/11/2022 in [place] Office Visit from 12/27/2021 in [place] ED from 12/24/2021 in [facility]  EMERGENCY DEPARTMENT  C-SSRS RISK CATEGORY No Risk No Risk No Risk        Assessment and Plan:  LASUNDRA HASCALL is a 82 y.o. year old female with a history of anxiety,  hypertension, hypothyroidism, pAfib, bradycardia,  with history of QTc prolongation, reactive airway, GERD, fibromyalgia, Spinal stenosis of lumbar region without neurogenic claudication, who presents for follow up appointment for below.   1. GAD (generalized anxiety disorder) 2. MDD (major depressive disorder), recurrent episode, mild (HCC) 3. Benzodiazepine dependence ([place]) Exam is notable for tearfulness, and there has been significant worsening in rumination on the anxiety.  This occurred in the context of her trying to taper down clonazepam despite the discussion was to be back on the original dose if this does not work.  Although she was advised to be back on the original dose given she used to be doing relatively doing well, she has strong preference to be off Klonopin. Unfortunately, antidepressant is not an option due to adverse reaction in the past. Both the patient and her daughter report a strong preference to try lorazepam; will try this medication at this time to target anxiety/mitigate benzodiazepine withdrawal.  Discussed potential risk of fall, over sedation, respiratory suppression with concomitant use of opioid. Although she will greatly benefit from CBT/ACT, she is not interested in this.    Plan Hold clonazepam (was taking 0.25 mg twice a day, tapered from 0.5 mg twice a day ) Start lorazepam 0.5 mg twice a day for anxiety  Next appointment: 2/20 at 3 pm, IP - on Hydrocodone 5 mg twice a day (not taking at all)   Past trials of medication: sertraline, bupropion (headache), duloxetine (limited benefit), venlafaxine (palpitation, wheezing according to the patient), viibryd, mirtazapine (dream, increase in appetite), Buspar (headache, migraine),  gabapentin (increased in appetite), pregbalin  (some neuralgia in her leg), valium        Collaboration of Care: Collaboration of Care: Other reviewed notes in Epic  Patient/Guardian was advised Release of Information must be obtained prior to any record release in order to collaborate their care with an outside provider. Patient/Guardian was advised if they have not already done so to contact the registration department to sign all necessary forms in order for [place] to release information regarding their care.   Consent: Patient/Guardian gives verbal consent for treatment and assignment of benefits for services provided during this visit. Patient/Guardian expressed understanding and agreed to proceed.    Norman Clay, MD 03/11/2022, 2:47 PM

## 2022-03-18 DIAGNOSIS — I4891 Unspecified atrial fibrillation: Secondary | ICD-10-CM | POA: Diagnosis not present

## 2022-03-18 DIAGNOSIS — R002 Palpitations: Secondary | ICD-10-CM | POA: Diagnosis not present

## 2022-03-18 DIAGNOSIS — F418 Other specified anxiety disorders: Secondary | ICD-10-CM | POA: Diagnosis not present

## 2022-03-18 DIAGNOSIS — Z79899 Other long term (current) drug therapy: Secondary | ICD-10-CM | POA: Diagnosis not present

## 2022-03-20 ENCOUNTER — Other Ambulatory Visit: Payer: Self-pay

## 2022-03-20 ENCOUNTER — Encounter (HOSPITAL_COMMUNITY): Payer: Self-pay

## 2022-03-20 ENCOUNTER — Emergency Department (HOSPITAL_COMMUNITY)
Admission: EM | Admit: 2022-03-20 | Discharge: 2022-03-20 | Disposition: A | Discharge status: Home / Self Care | Payer: No Typology Code available for payment source | Attending: Emergency Medicine | Admitting: Emergency Medicine

## 2022-03-20 DIAGNOSIS — Z7901 Long term (current) use of anticoagulants: Secondary | ICD-10-CM | POA: Diagnosis not present

## 2022-03-20 DIAGNOSIS — I4891 Unspecified atrial fibrillation: Secondary | ICD-10-CM | POA: Insufficient documentation

## 2022-03-20 DIAGNOSIS — F419 Anxiety disorder, unspecified: Secondary | ICD-10-CM | POA: Insufficient documentation

## 2022-03-20 DIAGNOSIS — R002 Palpitations: Secondary | ICD-10-CM | POA: Diagnosis not present

## 2022-03-20 LAB — CBC WITH DIFFERENTIAL/PLATELET
Abs Immature Granulocytes: 0.04 10*3/uL (ref 0.00–0.07)
Basophils Absolute: 0 10*3/uL (ref 0.0–0.1)
Basophils Relative: 0 %
Eosinophils Absolute: 0.2 10*3/uL (ref 0.0–0.5)
Eosinophils Relative: 2 %
HCT: 46.3 % — ABNORMAL HIGH (ref 36.0–46.0)
Hemoglobin: 15.4 g/dL — ABNORMAL HIGH (ref 12.0–15.0)
Immature Granulocytes: 0 %
Lymphocytes Relative: 46 %
Lymphs Abs: 4.8 10*3/uL — ABNORMAL HIGH (ref 0.7–4.0)
MCH: 30.9 pg (ref 26.0–34.0)
MCHC: 33.3 g/dL (ref 30.0–36.0)
MCV: 92.8 fL (ref 80.0–100.0)
Monocytes Absolute: 1 10*3/uL (ref 0.1–1.0)
Monocytes Relative: 10 %
Neutro Abs: 4.4 10*3/uL (ref 1.7–7.7)
Neutrophils Relative %: 42 %
Platelets: 299 10*3/uL (ref 150–400)
RBC: 4.99 MIL/uL (ref 3.87–5.11)
RDW: 13.6 % (ref 11.5–15.5)
WBC: 10.4 10*3/uL (ref 4.0–10.5)
nRBC: 0 % (ref 0.0–0.2)

## 2022-03-20 MED ORDER — ALPRAZOLAM 0.25 MG PO TABS
0.2500 mg | ORAL_TABLET | Freq: Once | ORAL | Status: AC
  Administered 2022-03-20: 0.25 mg via ORAL
  Filled 2022-03-20: qty 1

## 2022-03-20 NOTE — ED Triage Notes (Addendum)
Pt c/o heart palpitations, head fullness, ringing in ears, anxiety, insomnia, generalized weakness, nausea, cramping; pt placed on lorazepam, taken off of klonipin last Monday, pt states symptoms started then; pt tearful in triage

## 2022-03-20 NOTE — ED Provider Notes (Signed)
[facility] Provider Note   CSN: [id number] Arrival date & time: 03/20/22  1206     History  Chief Complaint  Patient presents with   Palpitations   Anxiety    Madison Jennings is a 82 y.o. female.  82 year old female presents with worsening anxiety since switching from clonazepam to lorazepam.  Has noted increased palpitations without syncope or near syncope.  No SI or HI.  States she has had insomnia.  Feels jittery.  Patient does have history of A-fib and does take Eliquis and has been compliant.  Denies any chest pain or chest pressure.  No fevers or chills.  No nausea or vomiting.       Home Medications Prior to Admission medications   Medication Sig Start Date End Date Taking? Authorizing Provider  acetaminophen (TYLENOL) 325 MG tablet Take 650 mg by mouth every 6 (six) hours as needed for moderate pain.    [provider]  amLODipine (NORVASC) 2.5 MG tablet Take 2.5 mg by mouth daily. 10/31/21   [provider]  apixaban (ELIQUIS) 5 MG TABS tablet Take 2.5 mg by mouth 2 (two) times daily. 07/16/21   [provider]  Chlorphen-Pseudoephed-APAP (TYLENOL ALLERGY SINUS PO) Take 1 tablet by mouth daily as needed (allergies).    [provider]  clonazePAM (KLONOPIN) 0.5 MG tablet Take 1 tablet (0.5 mg total) by mouth 2 (two) times daily as needed for anxiety. 03/11/22 05/10/22  Norman Clay, MD  dicyclomine (BENTYL) 10 MG capsule Take 10 mg by mouth daily as needed for spasms. 06/27/21   [provider]  Hyoscyamine Sulfate SL 0.125 MG SUBL Place 0.125 mg under the tongue daily as needed (stomach pain). 01/01/21   [provider]  levothyroxine (SYNTHROID, LEVOTHROID) 75 MCG tablet Take 75 mcg by mouth daily before breakfast.    [provider]  lisinopril (ZESTRIL) 20 MG tablet Take 20 mg by mouth daily. 12/08/21   [provider]  LORazepam (ATIVAN) 0.5 MG tablet Take 1  tablet (0.5 mg total) by mouth 2 (two) times daily as needed for anxiety. 03/11/22 05/10/22  Norman Clay, MD  Magnesium 500 MG CAPS Take 1 capsule (500 mg total) by mouth at bedtime. 10/04/21 04/02/22  [name], MD  nitroGLYCERIN (NITROSTAT) 0.4 MG SL tablet Place 0.4 mg under the tongue every 5 (five) minutes as needed for chest pain. 08/14/21   [provider]  pantoprazole (PROTONIX) 40 MG tablet Take 40 mg by mouth daily. 07/26/21 07/26/22  [provider]      Allergies    Patient has no known allergies.    Review of Systems   Review of Systems  All other systems reviewed and are negative.   Physical Exam Updated Vital Signs BP 102/74 (BP Location: Left Wrist)   Pulse 79   Temp 98.1 F (36.7 C)   Resp 18   Ht 1.651 m ('5\' 5"'$ )   Wt 94.8 kg   SpO2 94%   BMI 34.78 kg/m  Physical Exam Vitals and nursing note reviewed.  Constitutional:      General: She is not in acute distress.    Appearance: Normal appearance. She is well-developed. She is not toxic-appearing.  HENT:     Head: Normocephalic and atraumatic.  Eyes:     General: Lids are normal.     Conjunctiva/sclera: Conjunctivae normal.     Pupils: Pupils are equal, round, and reactive to light.  Neck:  Thyroid: No thyroid mass.     Trachea: No tracheal deviation.  Cardiovascular:     Rate and Rhythm: Normal rate and regular rhythm.     Heart sounds: Normal heart sounds. No murmur heard.    No gallop.  Pulmonary:     Effort: Pulmonary effort is normal. No respiratory distress.     Breath sounds: Normal breath sounds. No stridor. No decreased breath sounds, wheezing, rhonchi or rales.  Abdominal:     General: There is no distension.     Palpations: Abdomen is soft.     Tenderness: There is no abdominal tenderness. There is no rebound.  Musculoskeletal:        General: No tenderness. Normal range of motion.     Cervical back: Normal range of motion and neck supple.  Skin:    General: Skin is  warm and dry.     Findings: No abrasion or rash.  Neurological:     General: No focal deficit present.     Mental Status: She is alert and oriented to person, place, and time. Mental status is at baseline.     GCS: GCS eye subscore is 4. GCS verbal subscore is 5. GCS motor subscore is 6.     Cranial Nerves: No cranial nerve deficit.     Sensory: No sensory deficit.     Motor: Motor function is intact.  Psychiatric:        Attention and Perception: Attention normal.        Mood and Affect: Mood is anxious.        Speech: Speech normal.        Behavior: Behavior normal.     ED Results / Procedures / Treatments   Labs (all labs ordered are listed, but only abnormal results are displayed) Labs Reviewed  CBC WITH DIFFERENTIAL/PLATELET - Abnormal; Notable for the following components:      Result Value   Hemoglobin 15.4 (*)    HCT 46.3 (*)    Lymphs Abs 4.8 (*)    All other components within normal limits  URINALYSIS, ROUTINE W REFLEX MICROSCOPIC  BASIC METABOLIC PANEL    EKG EKG Interpretation  Date/Time:  Wednesday March 20 2022 13:09:24 EST Ventricular Rate:  89 PR Interval:    QRS Duration: 84 QT Interval:  352 QTC Calculation: 428 R Axis:   -34 Text Interpretation: Atrial fibrillation Left axis deviation ST & T wave abnormality, consider lateral ischemia Abnormal ECG When compared with ECG of 24-Dec-2021 16:40, PREVIOUS ECG IS PRESENT Confirmed by Lacretia Leigh ([id number]) on 03/20/2022 10:27:28 PM  Radiology No results found.  Procedures Procedures    Medications Ordered in ED Medications  ALPRAZolam (XANAX) tablet 0.25 mg (has no administration in time range)    ED Course/ Medical Decision Making/ A&P                             Medical Decision Making Risk Prescription drug management.   Patient is EKG shows atrial fibrillation.  Patient given Xanax for her anxiety.  CBC is reassuring here.  Patient's symptoms seem to be due to anxiety as well as  medication intolerance.  Plan will be for patient to follow-up with her doctor tomorrow to have her medications adjusted.        Final Clinical Impression(s) / ED Diagnoses Final diagnoses:  None    Rx / DC Orders ED Discharge Orders     None  Lacretia Leigh, MD 03/20/22 2249

## 2022-03-20 NOTE — ED Provider Triage Note (Signed)
Emergency Medicine Provider Triage Evaluation Note  Madison Jennings , a 82 y.o. female  was evaluated in triage.  Pt complains of worsening anxiousness, palpitations, and difficulty sleeping over the last few months to 1 year.  Reports having been seen several times for the symptoms without a clear diagnosis.  Palpitations described as variation between very slow and racing heartbeat.  Denies chest pain or discomfort, shortness of breath, fevers.  Recently switched from Klonopin to lorazepam last Monday, which is when the symptoms began to worsen.  Review of Systems  Positive:  Negative: See above  Physical Exam  BP 120/84   Pulse 78   Temp 98.3 F (36.8 C) (Oral)   Resp (!) 24   Ht '5\' 5"'$  (1.651 m)   Wt 94.8 kg   SpO2 98%   BMI 34.78 kg/m  Gen:   Awake, no distress, tearful Resp:  Normal effort, equal chest rise, non-TTP chest MSK:   Moves extremities without difficulty  Other:  Nondiaphoretic.  Abdomen soft, nontender  Medical Decision Making  Medically screening exam initiated at 12:54 PM.  Appropriate orders placed.  MASAYE GATCHALIAN was informed that the remainder of the evaluation will be completed by another provider, this initial triage assessment does not replace that evaluation, and the importance of remaining in the ED until their evaluation is complete.     Prince Rome, PA-C 59/97/74 [id number]

## 2022-03-20 NOTE — Discharge Instructions (Signed)
Call your psychiatrist in the morning to schedule an appointment to be seen

## 2022-03-21 ENCOUNTER — Ambulatory Visit: Payer: Medicare Other | Admitting: Psychiatry

## 2022-03-21 ENCOUNTER — Telehealth: Payer: Self-pay | Admitting: Psychiatry

## 2022-03-21 ENCOUNTER — Other Ambulatory Visit: Payer: Self-pay | Admitting: Psychiatry

## 2022-03-21 NOTE — Telephone Encounter (Signed)
Discussed with the patient.  She states that she is not doing well.  She feels weak and anxious.  She went to the emergency room.  After extensive discussion, she agrees to try the following.  - start clonazepam 0.5 mg twice a day (refill left per PMDP) - discontinue lorazepam

## 2022-03-21 NOTE — Telephone Encounter (Signed)
Patient left message stating that you started her on a new medication and it is not helping. Please call her to advise on situation.

## 2022-03-25 ENCOUNTER — Telehealth: Payer: Self-pay | Admitting: Psychiatry

## 2022-03-25 ENCOUNTER — Other Ambulatory Visit: Payer: Self-pay | Admitting: Psychiatry

## 2022-03-25 NOTE — Telephone Encounter (Signed)
Patient called stating she not doing well. Heart palpitations, sick to stomach, can't function she states, real anxious. Requesting to see Dr. Weber Cooks for some reason. Told her he doesn't see patients and you are her psychiatrist. Please call and advise regarding the medication change.

## 2022-03-26 DIAGNOSIS — N3000 Acute cystitis without hematuria: Secondary | ICD-10-CM | POA: Diagnosis not present

## 2022-03-26 DIAGNOSIS — F418 Other specified anxiety disorders: Secondary | ICD-10-CM | POA: Diagnosis not present

## 2022-03-27 ENCOUNTER — Emergency Department: Payer: No Typology Code available for payment source

## 2022-03-27 ENCOUNTER — Emergency Department
Admission: EM | Admit: 2022-03-27 | Discharge: 2022-03-27 | Disposition: A | Discharge status: Home / Self Care | Payer: No Typology Code available for payment source | Attending: Student in an Organized Health Care Education/Training Program | Admitting: Student in an Organized Health Care Education/Training Program

## 2022-03-27 ENCOUNTER — Other Ambulatory Visit: Payer: Self-pay

## 2022-03-27 DIAGNOSIS — Z7901 Long term (current) use of anticoagulants: Secondary | ICD-10-CM | POA: Diagnosis not present

## 2022-03-27 DIAGNOSIS — R4182 Altered mental status, unspecified: Secondary | ICD-10-CM | POA: Insufficient documentation

## 2022-03-27 DIAGNOSIS — R002 Palpitations: Secondary | ICD-10-CM | POA: Diagnosis not present

## 2022-03-27 DIAGNOSIS — F419 Anxiety disorder, unspecified: Secondary | ICD-10-CM | POA: Insufficient documentation

## 2022-03-27 DIAGNOSIS — I4891 Unspecified atrial fibrillation: Secondary | ICD-10-CM | POA: Diagnosis not present

## 2022-03-27 DIAGNOSIS — I959 Hypotension, unspecified: Secondary | ICD-10-CM | POA: Diagnosis not present

## 2022-03-27 DIAGNOSIS — R0600 Dyspnea, unspecified: Secondary | ICD-10-CM | POA: Diagnosis not present

## 2022-03-27 DIAGNOSIS — R079 Chest pain, unspecified: Secondary | ICD-10-CM | POA: Diagnosis present

## 2022-03-27 DIAGNOSIS — Z79899 Other long term (current) drug therapy: Secondary | ICD-10-CM | POA: Insufficient documentation

## 2022-03-27 DIAGNOSIS — Z1152 Encounter for screening for COVID-19: Secondary | ICD-10-CM | POA: Diagnosis not present

## 2022-03-27 DIAGNOSIS — R0602 Shortness of breath: Secondary | ICD-10-CM | POA: Diagnosis not present

## 2022-03-27 DIAGNOSIS — R110 Nausea: Secondary | ICD-10-CM | POA: Diagnosis not present

## 2022-03-27 LAB — COMPREHENSIVE METABOLIC PANEL
ALT: 12 U/L (ref 0–44)
AST: 23 U/L (ref 15–41)
Albumin: 3.8 g/dL (ref 3.5–5.0)
Alkaline Phosphatase: 85 U/L (ref 38–126)
Anion gap: 12 (ref 5–15)
BUN: 14 mg/dL (ref 8–23)
CO2: 19 mmol/L — ABNORMAL LOW (ref 22–32)
Calcium: 9.2 mg/dL (ref 8.9–10.3)
Chloride: 107 mmol/L (ref 98–111)
Creatinine, Ser: 0.97 mg/dL (ref 0.44–1.00)
GFR, Estimated: 59 mL/min — ABNORMAL LOW (ref >60)
Glucose, Bld: 110 mg/dL — ABNORMAL HIGH (ref 70–99)
Potassium: 4.1 mmol/L (ref 3.5–5.1)
Sodium: 138 mmol/L (ref 135–145)
Total Bilirubin: 1.1 mg/dL (ref 0.3–1.2)
Total Protein: 6.6 g/dL (ref 6.5–8.1)

## 2022-03-27 LAB — BRAIN NATRIURETIC PEPTIDE: B Natriuretic Peptide: 75.5 pg/mL (ref 0.0–100.0)

## 2022-03-27 LAB — RESP PANEL BY RT-PCR (RSV, FLU A&B, COVID)  RVPGX2
Influenza A by PCR: NEGATIVE
Influenza B by PCR: NEGATIVE
Resp Syncytial Virus by PCR: NEGATIVE
SARS Coronavirus 2 by RT PCR: NEGATIVE

## 2022-03-27 LAB — CBC WITH DIFFERENTIAL/PLATELET
Abs Immature Granulocytes: 0.01 10*3/uL (ref 0.00–0.07)
Basophils Absolute: 0 10*3/uL (ref 0.0–0.1)
Basophils Relative: 0 %
Eosinophils Absolute: 0.2 10*3/uL (ref 0.0–0.5)
Eosinophils Relative: 2 %
HCT: 43.1 % (ref 36.0–46.0)
Hemoglobin: 14.8 g/dL (ref 12.0–15.0)
Immature Granulocytes: 0 %
Lymphocytes Relative: 60 %
Lymphs Abs: 5.3 10*3/uL — ABNORMAL HIGH (ref 0.7–4.0)
MCH: 31.4 pg (ref 26.0–34.0)
MCHC: 34.3 g/dL (ref 30.0–36.0)
MCV: 91.3 fL (ref 80.0–100.0)
Monocytes Absolute: 0.8 10*3/uL (ref 0.1–1.0)
Monocytes Relative: 9 %
Neutro Abs: 2.6 10*3/uL (ref 1.7–7.7)
Neutrophils Relative %: 29 %
Platelets: 233 10*3/uL (ref 150–400)
RBC: 4.72 MIL/uL (ref 3.87–5.11)
RDW: 13.5 % (ref 11.5–15.5)
WBC: 8.8 10*3/uL (ref 4.0–10.5)
nRBC: 0 % (ref 0.0–0.2)

## 2022-03-27 LAB — TROPONIN I (HIGH SENSITIVITY)
Troponin I (High Sensitivity): 8 ng/L (ref <18)
Troponin I (High Sensitivity): 10 ng/L (ref <18)

## 2022-03-27 MED ORDER — CLONAZEPAM 0.5 MG PO TABS
0.5000 mg | ORAL_TABLET | Freq: Every day | ORAL | Status: DC
  Administered 2022-03-27: 0.5 mg via ORAL
  Filled 2022-03-27: qty 1

## 2022-03-27 MED ORDER — CLONAZEPAM 0.5 MG PO TABS: 0.5000 mg | ORAL_TABLET | Freq: Every day | ORAL | Status: DC

## 2022-03-27 NOTE — ED Notes (Addendum)
Family member at desk requesting patient to be taken to bathroom as she is unable to urinate in external female catheter. Patient ambulated to toilet with steady gait. Resp even, unlabored on RA. Ambulated back to bed. Oxygen saturation noted to be at 85% on room air after getting back to bed. Placed on 2L per [place] and oxygen increased to 99%.

## 2022-03-27 NOTE — ED Provider Notes (Signed)
[facility] Provider Note    None    (approximate)   History   Shortness of Breath and Chest Pain   HPI  Madison Jennings is a 82 y.o. female who presents to the ER for evaluation of shortness of breath.  States that she was feeling very anxious.  States that they have been making some changes to her anxiety medication she typically takes Klonopin 0.5 at 6:00.  The symptoms happen before 6.  EMS evaluated patient was found to be in A-fib rate controlled no hypoxia.  She denies any pain or discomfort right now.     Physical Exam   Triage Vital Signs: ED Triage Vitals [03/27/22 1843]  Enc Vitals Group     BP      Pulse      Resp      Temp      Temp src      SpO2 98 %     Weight      Height      Head Circumference      Peak Flow      Pain Score      Pain Loc      Pain Edu?      Excl. in [place]?     Most recent vital signs: Vitals:   03/27/22 2130 03/27/22 2200  BP: 110/71 (!) 120/53  Pulse: 69 72  Resp: 17 19  Temp:    SpO2: 94% 96%     Constitutional: Alert  Eyes: Conjunctivae are normal.  Head: Atraumatic. Nose: No congestion/rhinnorhea. Mouth/Throat: Mucous membranes are moist.   Neck: Painless ROM.  Cardiovascular:   Good peripheral circulation. Respiratory: Normal respiratory effort. Coarse bibasilar bs Gastrointestinal: Soft and nontender.  Musculoskeletal:  no deformity Neurologic:  MAE spontaneously. No gross focal neurologic deficits are appreciated.  Skin:  Skin is warm, dry and intact. No rash noted. Psychiatric: Mood and affect are normal. Speech and behavior are normal.    ED Results / Procedures / Treatments   Labs (all labs ordered are listed, but only abnormal results are displayed) Labs Reviewed  CBC WITH DIFFERENTIAL/PLATELET - Abnormal; Notable for the following components:      Result Value   Lymphs Abs 5.3 (*)    All other components within normal limits  COMPREHENSIVE METABOLIC PANEL - Abnormal; Notable  for the following components:   CO2 19 (*)    Glucose, Bld 110 (*)    GFR, Estimated 59 (*)    All other components within normal limits  RESP PANEL BY RT-PCR (RSV, FLU A&B, COVID)  [id number]  BRAIN NATRIURETIC PEPTIDE  TROPONIN I (HIGH SENSITIVITY)  TROPONIN I (HIGH SENSITIVITY)     EKG  ED ECG REPORT I, Merlyn Lot, the attending physician, personally viewed and interpreted this ECG.   Date: 03/27/2022  EKG Time: 18:43  Rate: 65  Rhythm: afib  Axis: normal  Intervals: prolonged  ST&T Change: no stemi, no depressions    RADIOLOGY Please see ED Course for my review and interpretation.  I personally reviewed all radiographic images ordered to evaluate for the above acute complaints and reviewed radiology reports and findings.  These findings were personally discussed with the patient.  Please see medical record for radiology report.    PROCEDURES:  Critical Care performed: No  Procedures   MEDICATIONS ORDERED IN ED: Medications  clonazePAM (KLONOPIN) tablet 0.5 mg (0.5 mg Oral Given 03/27/22 2043)     IMPRESSION / MDM / ASSESSMENT AND  PLAN / ED COURSE  I reviewed the triage vital signs and the nursing notes.                              Differential diagnosis includes, but is not limited to, dysrhythmia, acs, copd, pe, pna, anxiety  Patient presenting to the ER for evaluation of symptoms as described above.  Based on symptoms, risk factors and considered above differential, this presenting complaint could reflect a potentially life-threatening illness therefore the patient will be placed on continuous pulse oximetry and telemetry for monitoring.  Laboratory evaluation will be sent to evaluate for the above complaints.  Patient reportedly had a brief episode of hypoxia after ambulation but appears well at rest right now.  Not sure if it was a good waveform therefore will observe longer on cardiac monitor.    Clinical Course as of 03/27/22 2331  Wed Mar 27, 2022  1924 Chest x-ray my review and interpretation without evidence of consolidation or infiltrate. [PR]  2040 BNP normal.  Initial troponin negative.  RVP negative.  No leukocytosis.  She is able to ambulate on room air without any hypoxia or respiratory distress.  She is already on anticoagulation therefore have very low suspicion for PE. [PR]    Clinical Course User Index [PR] Merlyn Lot, MD   Serial troponins are both negative.  She is denying any pain or pressure.  Feels significant improved after Klonopin.  Given her presentation we discussed option for hospitalization observation chest pain rule out.  Further discussion with the patient she feels like she may have had some palpitations related to her A-fib which caused her to have worsening anxiety and she had a panic attack.  She now feels well and feels comfortable going home as she has close outpatient follow-up with cardiology.  I think that is a reasonable plan.  FINAL CLINICAL IMPRESSION(S) / ED DIAGNOSES   Final diagnoses:  Palpitations     Rx / DC Orders   ED Discharge Orders     None        Note:  This document was prepared using Dragon voice recognition software and may include unintentional dictation errors.    Merlyn Lot, MD 03/27/22 [phone]

## 2022-03-27 NOTE — ED Triage Notes (Signed)
Pt brought to ED via [facility], aruond 5pm pt had sudden onset SOB with nausea. Enroute to ED pt started complaining of CP.  EMS reports vitals WNL, afib on the monitor. Pt states head feels full with "sizzle and ringing sensation in my head."  Pt given '4mg'$  zofran and nitro on way to ED.

## 2022-03-27 NOTE — ED Notes (Signed)
Patient ambulated in hallway with standby assistance. Patient with steady gait. Oxygen saturation remained > 93% on room air during walk. Afib on monitor, heart rate briefly up to 117 BPM but back to 70-80's after patient back in bed. Patient stated she felt like her heart was racing during walk but without shortness of breath.

## 2022-03-27 NOTE — Discharge Instructions (Signed)
Follow-up with your schedule appointment Dr. Clayborn Bigness.  Take your medications as prescribed by Dr. Clayborn Bigness.

## 2022-03-27 NOTE — ED Notes (Signed)
Pt stated she takes klonopin at 1800 everyday

## 2022-04-01 ENCOUNTER — Other Ambulatory Visit
Admission: RE | Admit: 2022-04-01 | Discharge: 2022-04-01 | Disposition: A | Discharge status: Home / Self Care | Payer: No Typology Code available for payment source | Source: Ambulatory Visit | Attending: Internal Medicine | Admitting: Internal Medicine

## 2022-04-01 DIAGNOSIS — Z01818 Encounter for other preprocedural examination: Secondary | ICD-10-CM | POA: Insufficient documentation

## 2022-04-01 DIAGNOSIS — R0602 Shortness of breath: Secondary | ICD-10-CM | POA: Insufficient documentation

## 2022-04-01 DIAGNOSIS — I10 Essential (primary) hypertension: Secondary | ICD-10-CM | POA: Diagnosis not present

## 2022-04-01 DIAGNOSIS — I4891 Unspecified atrial fibrillation: Secondary | ICD-10-CM | POA: Insufficient documentation

## 2022-04-01 DIAGNOSIS — N1831 Chronic kidney disease, stage 3a: Secondary | ICD-10-CM | POA: Diagnosis not present

## 2022-04-01 DIAGNOSIS — I480 Paroxysmal atrial fibrillation: Secondary | ICD-10-CM | POA: Diagnosis not present

## 2022-04-01 LAB — BRAIN NATRIURETIC PEPTIDE: B Natriuretic Peptide: 158.4 pg/mL — ABNORMAL HIGH (ref 0.0–100.0)

## 2022-04-03 ENCOUNTER — Other Ambulatory Visit: Payer: Self-pay

## 2022-04-03 ENCOUNTER — Emergency Department
Admission: EM | Admit: 2022-04-03 | Discharge: 2022-04-03 | Disposition: A | Discharge status: Home / Self Care | Payer: No Typology Code available for payment source | Attending: Emergency Medicine | Admitting: Emergency Medicine

## 2022-04-03 DIAGNOSIS — J45909 Unspecified asthma, uncomplicated: Secondary | ICD-10-CM | POA: Diagnosis not present

## 2022-04-03 DIAGNOSIS — N189 Chronic kidney disease, unspecified: Secondary | ICD-10-CM | POA: Insufficient documentation

## 2022-04-03 DIAGNOSIS — I130 Hypertensive heart and chronic kidney disease with heart failure and stage 1 through stage 4 chronic kidney disease, or unspecified chronic kidney disease: Secondary | ICD-10-CM | POA: Diagnosis not present

## 2022-04-03 DIAGNOSIS — R531 Weakness: Secondary | ICD-10-CM | POA: Diagnosis not present

## 2022-04-03 DIAGNOSIS — R002 Palpitations: Secondary | ICD-10-CM | POA: Insufficient documentation

## 2022-04-03 DIAGNOSIS — I5030 Unspecified diastolic (congestive) heart failure: Secondary | ICD-10-CM | POA: Diagnosis not present

## 2022-04-03 DIAGNOSIS — R001 Bradycardia, unspecified: Secondary | ICD-10-CM | POA: Diagnosis not present

## 2022-04-03 DIAGNOSIS — I959 Hypotension, unspecified: Secondary | ICD-10-CM | POA: Diagnosis not present

## 2022-04-03 DIAGNOSIS — E039 Hypothyroidism, unspecified: Secondary | ICD-10-CM | POA: Diagnosis not present

## 2022-04-03 LAB — CBC
HCT: 45 % (ref 36.0–46.0)
Hemoglobin: 15.3 g/dL — ABNORMAL HIGH (ref 12.0–15.0)
MCH: 31.2 pg (ref 26.0–34.0)
MCHC: 34 g/dL (ref 30.0–36.0)
MCV: 91.6 fL (ref 80.0–100.0)
Platelets: 247 10*3/uL (ref 150–400)
RBC: 4.91 MIL/uL (ref 3.87–5.11)
RDW: 13.5 % (ref 11.5–15.5)
WBC: 8.9 10*3/uL (ref 4.0–10.5)
nRBC: 0 % (ref 0.0–0.2)

## 2022-04-03 LAB — URINALYSIS, ROUTINE W REFLEX MICROSCOPIC
Bilirubin Urine: NEGATIVE
Glucose, UA: NEGATIVE mg/dL
Hgb urine dipstick: NEGATIVE
Ketones, ur: NEGATIVE mg/dL
Nitrite: NEGATIVE
Protein, ur: NEGATIVE mg/dL
Specific Gravity, Urine: 1.008 (ref 1.005–1.030)
pH: 7 (ref 5.0–8.0)

## 2022-04-03 LAB — TROPONIN I (HIGH SENSITIVITY)
Troponin I (High Sensitivity): 9 ng/L (ref <18)
Troponin I (High Sensitivity): 10 ng/L (ref <18)

## 2022-04-03 LAB — BASIC METABOLIC PANEL
Anion gap: 10 (ref 5–15)
BUN: 12 mg/dL (ref 8–23)
CO2: 20 mmol/L — ABNORMAL LOW (ref 22–32)
Calcium: 9.9 mg/dL (ref 8.9–10.3)
Chloride: 110 mmol/L (ref 98–111)
Creatinine, Ser: 0.98 mg/dL (ref 0.44–1.00)
GFR, Estimated: 59 mL/min — ABNORMAL LOW (ref >60)
Glucose, Bld: 91 mg/dL (ref 70–99)
Potassium: 3.6 mmol/L (ref 3.5–5.1)
Sodium: 140 mmol/L (ref 135–145)

## 2022-04-03 LAB — D-DIMER, QUANTITATIVE: D-Dimer, Quant: <0.27 ug/mL-FEU (ref 0.00–0.50)

## 2022-04-03 NOTE — ED Provider Notes (Signed)
[facility] Provider Note    Event Date/Time   First MD Initiated Contact with Patient 04/03/22 1529     (approximate)   History   Weakness   HPI  Madison Jennings is a 82 y.o. female past medical history of anxiety, paroxysmal A-fib, diastolic heart failure, hypertension, hypothyroidism, GERD, CKD who presents because of palpitations and weakness.  Patient tells me that for the last several months she has had issues with palpitations.  They often wake her up in the middle the night.  Tells me that when she checks her heart rate it will be as high as the 80s and she takes diltiazem 4 times a day as needed for this.  She is also feeling palpitations during the day.  She has minimal chest tightness occasionally will have a twinging sensation in the chest but is not constant and not during exertion.  She does have dyspnea on exertion and feels generally weak.  She feels like the palpitations exacerbate her anxiety and frequently and has decreased appetite.  Has had some urinary symptoms of late but nothing currently.  Was treated for UTI recently.  Denies fevers chills cough or current abdominal pain.   Reviewed cardiology note from 2/5 where patient reported heart racing and severe shortness of breath with minimal exertion as well as weakness on exertion.  Dr. Clayborn Bigness recommended right and left heart cath for evaluation of unstable angina or anginal equivalent with consideration of cardioversion afterwards.     Past Medical History:  Diagnosis Date   Anxiety    Arthritis    Asthma    Chronic back pain    Chronic back pain    scoliosis and 2 buldging disc   Depression    Diastolic dysfunction    Diverticulosis    Dysrhythmia    PALPITATIONS   Fibromyalgia    Gastritis    GERD (gastroesophageal reflux disease)    takes Protonix daily   Headache(784.0)    sinus related   Hyperlipidemia    just diagnosed;will follow up in 6wks to discuss meds    Hypertension    takes Lisinopril daily   Hypothyroidism    takes Synthroid daily   Ischemic colitis ([place])    Joint pain    Nocturia    Obesity    Vitamin D deficiency    just placed on Vit D this week    Patient Active Problem List   Diagnosis Date Noted   Anxiety 11/01/2021   Bradycardia 12/03/2019   HNP (herniated nucleus pulposus), thoracic 04/24/2018   Spinal cord stimulator status 07/27/2014   Acute thoracic back pain 07/27/2014   SUI (stress urinary incontinence, female) 12/27/2013   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 12/27/2013   Dysuria 12/27/2013   Generalized abdominal pain 12/27/2013   Left renal atrophy 12/27/2013   Nocturia 12/27/2013   Bilateral lower abdominal discomfort 12/27/2013   Pain in joint, shoulder region 11/22/2013   Shoulder weakness 11/22/2013   Chronic back pain 07/12/2013   Lumbar stenosis 06/20/2011   Diarrhea 05/02/2011   Generalized weakness 05/02/2011   Dehydration 05/02/2011   HTN (hypertension) 05/02/2011   Hypothyroidism 05/02/2011   GERD (gastroesophageal reflux disease) 05/02/2011     Physical Exam  Triage Vital Signs: ED Triage Vitals  Enc Vitals Group     BP 04/03/22 1420 114/64     Pulse Rate 04/03/22 1420 (!) 59     Resp 04/03/22 1420 16     Temp 04/03/22 1420  97.6 F (36.4 C)     Temp Source 04/03/22 1420 Oral     SpO2 04/03/22 1420 99 %     Weight 04/03/22 1422 209 lb (94.8 kg)     Height 04/03/22 1422 '5\' 4"'$  (1.626 m)     Head Circumference --      Peak Flow --      Pain Score 04/03/22 1422 0     Pain Loc --      Pain Edu? --      Excl. in [place]? --     Most recent vital signs: Vitals:   04/03/22 1630 04/03/22 1645  BP: (!) 149/111   Pulse: 72 76  Resp: 20 (!) 24  Temp:    SpO2: 98% 98%     General: Awake, no distress.  CV:  Good peripheral perfusion.  Resp:  Normal effort.  Lungs are clear no increased work of breathing Abd:  No distention.  Abdomen soft nontender throughout Neuro:              Awake, Alert, Oriented x 3  Other:  No peripheral edema   ED Results / Procedures / Treatments  Labs (all labs ordered are listed, but only abnormal results are displayed) Labs Reviewed  BASIC METABOLIC PANEL - Abnormal; Notable for the following components:      Result Value   CO2 20 (*)    GFR, Estimated 59 (*)    All other components within normal limits  CBC - Abnormal; Notable for the following components:   Hemoglobin 15.3 (*)    All other components within normal limits  URINALYSIS, ROUTINE W REFLEX MICROSCOPIC - Abnormal; Notable for the following components:   Color, Urine YELLOW (*)    APPearance CLEAR (*)    Leukocytes,Ua SMALL (*)    Bacteria, UA RARE (*)    All other components within normal limits  D-DIMER, QUANTITATIVE  CBG MONITORING, ED  TROPONIN I (HIGH SENSITIVITY)  TROPONIN I (HIGH SENSITIVITY)     EKG  EKG reviewed and interpreted by myself shows atrial fibrillation that is rate controlled, normal axis, normal intervals, ST depression 1 aVL V6, T wave flattening throughout   RADIOLOGY    PROCEDURES:  Critical Care performed: No  Procedures  The patient is on the cardiac monitor to evaluate for evidence of arrhythmia and/or significant heart rate changes.   MEDICATIONS ORDERED IN ED: Medications - No data to display   IMPRESSION / MDM / [place] / ED COURSE  I reviewed the triage vital signs and the nursing notes.                              Patient's presentation is most consistent with acute complicated illness / injury requiring diagnostic workup.  Differential diagnosis includes, but is not limited to, symptomatic atrial fibrillation, other arrhythmia, PVCs, electrode abnormality, anemia, anxiety, depression  82 year old female with recently diagnosed A-fib who is currently rate controlled and on Eliquis presents because of ongoing issues with palpitations that are causing her anxiety as well as dyspnea on exertion and  generalized weakness.  Symptoms started several months ago and have been progressive.  Feels an irregular heartbeat particularly in the evening at night when she is trying to sleep.  Creates significant amount of anxiety.  Feels that she is having difficulty slowing her breathing down during these episodes.  They are happening during the day as well.  She her  activity level has diminished because of weakness and dyspnea on exertion.  She is treated for anxiety and is on Klonopin twice daily scheduled.  Sees a psychiatrist for this.  She also follows with Dr. Clayborn Bigness saw him 2 days ago where she discussed these issues.  He is planning to do right and left heart cath and there is documentation of possible consideration for cardioversion.  Is currently in rate controlled A-fib with rates in the 70s blood pressure is okay.  She does look anxious.  No signs of volume overload lungs are clear abdominal exam is benign.  First troponin is negative.  I do think that patient's anxiety is contributing to her symptoms today.  I have low suspicion for ACS at this time.  It is also possible that she is just poorly tolerant of the A-fib.  I did discuss with Dr. Clayborn Bigness her cardiologist and he recommends checking a D-dimer just to take this off the table given patient's frequent complaints of dyspnea and palpitations.  I think this is reasonable.  He feels that if this is negative or CTA is negative that she could be safely discharged with outpatient follow-up.  D-dimer is negative.  Repeat troponin is negative.  Patient initially told me she is not having urinary symptoms but now is saying she is having pain in the lower bladder region when she has to urinate.  Checked urinalysis is not consistent with UTI.  Will discharge with outpatient cardiology follow-up.   Clinical Course as of 04/03/22 1927  Wed Apr 03, 2022  1610 D-Dimer, Quant: <0.27 [SD]  1841 D-dimer, quantitative [SD]    Clinical Course User Index [SD]  Dillard Essex     FINAL CLINICAL IMPRESSION(S) / ED DIAGNOSES   Final diagnoses:  Palpitations     Rx / DC Orders   ED Discharge Orders     None        Note:  This document was prepared using Dragon voice recognition software and may include unintentional dictation errors.   Rada Hay, MD 04/03/22 [phone]

## 2022-04-03 NOTE — ED Triage Notes (Addendum)
Weakness started at 0400 this am, hx of a.fib, mild nausea that is intermittent, from home and lives with her spouse, had some chest pain this am, pt's dr decreased the dose of her klonipin and has put her on another antidepressant Pt states that she saw dr Clayborn Bigness 2 days ago who told the pt that he was planning to do a cardiac cath

## 2022-04-03 NOTE — Discharge Instructions (Signed)
Please follow-up with Dr. Etta Quill office about the timing of your catheterization procedure.  Please try to focus on deep breathing when you are having episodes of palpitations and anxiety.  Continue to take your medication as prescribed.

## 2022-04-04 ENCOUNTER — Telehealth: Payer: Self-pay | Admitting: Psychiatry

## 2022-04-04 ENCOUNTER — Other Ambulatory Visit: Payer: Self-pay | Admitting: Psychiatry

## 2022-04-04 NOTE — Telephone Encounter (Signed)
I discussed with the patient. She reports experiencing panic attacks and palpitations. Despite having an upcoming appointment with her cardiologist for a cardiac catheterization, she was advised to reach out to psychiatry. She had been taking clonazepam 0.5 mg twice daily. She is not taking any lorazepam. She agrees to try the following, pending further cardiac evaluation:  - Increase clonazepam to 0.5 mg three times a day as needed for anxiety. Hold if experiencing confusion, falls, or drowsiness. - Contact the office if she requires a refill.

## 2022-04-04 NOTE — Telephone Encounter (Signed)
Patient left voice message stating that she has been to the emergency department due to her not doing well. Heart palpations and anxious. She does not know what to do. Said doctor at thinks she needs to see you sooner then 04-16-22. Notes are in chart from visit at emergency department. Please call to advise her

## 2022-04-10 ENCOUNTER — Ambulatory Visit
Admission: RE | Admit: 2022-04-10 | Discharge: 2022-04-10 | Disposition: A | Discharge status: Home / Self Care | Payer: No Typology Code available for payment source | Attending: Internal Medicine | Admitting: Internal Medicine

## 2022-04-10 ENCOUNTER — Encounter: Payer: Self-pay | Admitting: Internal Medicine

## 2022-04-10 ENCOUNTER — Encounter
Admission: RE | Disposition: A | Discharge status: Home / Self Care | Payer: Self-pay | Source: Home / Self Care | Attending: Internal Medicine

## 2022-04-10 ENCOUNTER — Other Ambulatory Visit: Payer: Self-pay

## 2022-04-10 DIAGNOSIS — R0602 Shortness of breath: Secondary | ICD-10-CM | POA: Insufficient documentation

## 2022-04-10 DIAGNOSIS — I272 Pulmonary hypertension, unspecified: Secondary | ICD-10-CM | POA: Diagnosis not present

## 2022-04-10 DIAGNOSIS — I2723 Pulmonary hypertension due to lung diseases and hypoxia: Secondary | ICD-10-CM | POA: Diagnosis not present

## 2022-04-10 HISTORY — PX: RIGHT/LEFT HEART CATH AND CORONARY ANGIOGRAPHY: CATH118266

## 2022-04-10 LAB — POCT I-STAT EG7
Acid-base deficit: 3 mmol/L — ABNORMAL HIGH (ref 0.0–2.0)
Bicarbonate: 22.3 mmol/L (ref 20.0–28.0)
Calcium, Ion: 1.27 mmol/L (ref 1.15–1.40)
HCT: 40 % (ref 36.0–46.0)
Hemoglobin: 13.6 g/dL (ref 12.0–15.0)
O2 Saturation: 73 %
Potassium: 4.2 mmol/L (ref 3.5–5.1)
Sodium: 137 mmol/L (ref 135–145)
TCO2: 24 mmol/L (ref 22–32)
pCO2, Ven: 40.9 mmHg — ABNORMAL LOW (ref 44–60)
pH, Ven: 7.345 (ref 7.25–7.43)
pO2, Ven: 41 mmHg (ref 32–45)

## 2022-04-10 LAB — POCT I-STAT 7, (LYTES, BLD GAS, ICA,H+H)
Acid-base deficit: 2 mmol/L (ref 0.0–2.0)
Bicarbonate: 23.1 mmol/L (ref 20.0–28.0)
Calcium, Ion: 1.21 mmol/L (ref 1.15–1.40)
HCT: 38 % (ref 36.0–46.0)
Hemoglobin: 12.9 g/dL (ref 12.0–15.0)
O2 Saturation: 93 %
Potassium: 4.3 mmol/L (ref 3.5–5.1)
Sodium: 138 mmol/L (ref 135–145)
TCO2: 24 mmol/L (ref 22–32)
pCO2 arterial: 39.1 mmHg (ref 32–48)
pH, Arterial: 7.379 (ref 7.35–7.45)
pO2, Arterial: 68 mmHg — ABNORMAL LOW (ref 83–108)

## 2022-04-10 LAB — CARDIAC CATHETERIZATION: Cath EF Quantitative: 50 %

## 2022-04-10 SURGERY — RIGHT/LEFT HEART CATH AND CORONARY ANGIOGRAPHY
Anesthesia: Moderate Sedation | Laterality: Bilateral

## 2022-04-10 MED ORDER — SODIUM CHLORIDE 0.9% FLUSH: 3.0000 mL | INTRAVENOUS | Status: DC | PRN

## 2022-04-10 MED ORDER — HEPARIN SODIUM (PORCINE) 1000 UNIT/ML IJ SOLN
INTRAMUSCULAR | Status: DC | PRN
  Administered 2022-04-10: 4500 [IU] via INTRAVENOUS

## 2022-04-10 MED ORDER — SODIUM CHLORIDE 0.9 % WEIGHT BASED INFUSION
1.0000 mL/kg/h | INTRAVENOUS | Status: DC
  Administered 2022-04-10: 1 mL/kg/h via INTRAVENOUS

## 2022-04-10 MED ORDER — SODIUM CHLORIDE 0.9% FLUSH: 3.0000 mL | Freq: Two times a day (BID) | INTRAVENOUS | Status: DC

## 2022-04-10 MED ORDER — ASPIRIN 81 MG PO CHEW: 81.0000 mg | CHEWABLE_TABLET | ORAL | Status: AC

## 2022-04-10 MED ORDER — ACETAMINOPHEN 325 MG PO TABS: 650.0000 mg | ORAL_TABLET | ORAL | Status: DC | PRN

## 2022-04-10 MED ORDER — MIDAZOLAM HCL 2 MG/2ML IJ SOLN
INTRAMUSCULAR | Status: AC
  Filled 2022-04-10: qty 2

## 2022-04-10 MED ORDER — ONDANSETRON HCL 4 MG/2ML IJ SOLN: 4.0000 mg | Freq: Four times a day (QID) | INTRAMUSCULAR | Status: DC | PRN

## 2022-04-10 MED ORDER — IOHEXOL 300 MG/ML  SOLN
INTRAMUSCULAR | Status: DC | PRN
  Administered 2022-04-10: 65 mL

## 2022-04-10 MED ORDER — LABETALOL HCL 5 MG/ML IV SOLN: 10.0000 mg | INTRAVENOUS | Status: DC | PRN

## 2022-04-10 MED ORDER — VERAPAMIL HCL 2.5 MG/ML IV SOLN
INTRAVENOUS | Status: DC | PRN
  Administered 2022-04-10: 2.5 mg via INTRA_ARTERIAL

## 2022-04-10 MED ORDER — ASPIRIN 81 MG PO CHEW
CHEWABLE_TABLET | ORAL | Status: AC
  Administered 2022-04-10: 81 mg via ORAL
  Filled 2022-04-10: qty 1

## 2022-04-10 MED ORDER — VERAPAMIL HCL 2.5 MG/ML IV SOLN
INTRAVENOUS | Status: AC
  Filled 2022-04-10: qty 2

## 2022-04-10 MED ORDER — MIDAZOLAM HCL 2 MG/2ML IJ SOLN
INTRAMUSCULAR | Status: DC | PRN
  Administered 2022-04-10: 1 mg via INTRAVENOUS
  Administered 2022-04-10: .5 mg via INTRAVENOUS

## 2022-04-10 MED ORDER — HEPARIN (PORCINE) IN NACL 1000-0.9 UT/500ML-% IV SOLN
INTRAVENOUS | Status: DC | PRN
  Administered 2022-04-10 (×2): 500 mL

## 2022-04-10 MED ORDER — FENTANYL CITRATE (PF) 100 MCG/2ML IJ SOLN
INTRAMUSCULAR | Status: AC
  Filled 2022-04-10: qty 2

## 2022-04-10 MED ORDER — HEPARIN (PORCINE) IN NACL 1000-0.9 UT/500ML-% IV SOLN
INTRAVENOUS | Status: AC
  Filled 2022-04-10: qty 1000

## 2022-04-10 MED ORDER — HYDRALAZINE HCL 20 MG/ML IJ SOLN: 10.0000 mg | INTRAMUSCULAR | Status: DC | PRN

## 2022-04-10 MED ORDER — HEPARIN SODIUM (PORCINE) 1000 UNIT/ML IJ SOLN
INTRAMUSCULAR | Status: AC
  Filled 2022-04-10: qty 10

## 2022-04-10 MED ORDER — FENTANYL CITRATE (PF) 100 MCG/2ML IJ SOLN
INTRAMUSCULAR | Status: DC | PRN
  Administered 2022-04-10 (×3): 25 ug via INTRAVENOUS

## 2022-04-10 MED ORDER — ACETAMINOPHEN 325 MG PO TABS
ORAL_TABLET | ORAL | Status: AC
  Administered 2022-04-10: 650 mg via ORAL
  Filled 2022-04-10: qty 2

## 2022-04-10 MED ORDER — SODIUM CHLORIDE 0.9 % IV SOLN: 250.0000 mL | INTRAVENOUS | Status: DC | PRN

## 2022-04-10 MED ORDER — SODIUM CHLORIDE 0.9 % WEIGHT BASED INFUSION
3.0000 mL/kg/h | INTRAVENOUS | Status: AC
  Administered 2022-04-10: 3 mL/kg/h via INTRAVENOUS

## 2022-04-10 SURGICAL SUPPLY — 14 items
BAND CMPR LRG ZPHR (HEMOSTASIS) ×1
BAND ZEPHYR COMPRESS 30 LONG (HEMOSTASIS) IMPLANT
CATH 5FR JL3.5 JR4 ANG PIG MP (CATHETERS) IMPLANT
CATH BALLN WEDGE 5F 110CM (CATHETERS) IMPLANT
DRAPE BRACHIAL (DRAPES) IMPLANT
GLIDESHEATH SLEND SS 6F .021 (SHEATH) IMPLANT
GUIDEWIRE INQWIRE 1.5J.035X260 (WIRE) IMPLANT
INQWIRE 1.5J .035X260CM (WIRE) ×1
PACK CARDIAC CATH (CUSTOM PROCEDURE TRAY) ×2 IMPLANT
PROTECTION STATION PRESSURIZED (MISCELLANEOUS) ×1
SET ATX-X65L (MISCELLANEOUS) IMPLANT
SHEATH GLIDE SLENDER 4/5FR (SHEATH) IMPLANT
STATION PROTECTION PRESSURIZED (MISCELLANEOUS) IMPLANT
WIRE HITORQ VERSACORE ST 145CM (WIRE) IMPLANT

## 2022-04-10 NOTE — Progress Notes (Addendum)
Pt drinking water and eating PB cracker; husband at bedside

## 2022-04-11 ENCOUNTER — Encounter: Payer: Self-pay | Admitting: Internal Medicine

## 2022-04-13 NOTE — Progress Notes (Unsigned)
[facility] MD/PA/NP OP Progress Note  04/16/2022 3:38 PM Madison Jennings  MRN:  [id number]  Chief Complaint:  Chief Complaint  Patient presents with   Follow-up   HPI:  - she had a cardiac cath on 2/14 for evaluation of "possible persistent unstable angina anginal equivalent symptoms with persistent dyspnea on exertion, consider cardioversion afterwards"   This is a follow-up appointment for anxiety.  She states that she is not doing good.  She cries all the time.  She feels weird.  She is taking clonazepam twice a day instead of 3 times a day as she felt drunk.  She does not think she feels better. The patient has mood symptoms as in PHQ-9/GAD-7. She sleeps up to five hours. She denies SI. She denies hallucinations or confusions.   Her son presents to the visit.  He visits her at least twice a week, and calls her every morning.  She is constantly worried.  Although he tries to let her go outside, she is scared.  He states that her anxiety has worsened since feeling like dying in the setting of lowering the dose of clonazepam.  She is scared about the medication since then.  She is not as drunk since taking less clonazepam, although she appears to be woozy.  She has some tinnitus when she tries to take a walk.  He understands the long term risk of clonazepam. He wishes her to be better. He wishes her to be seen by a therapist (if she agrees). He agrees to discuss the use of quetiapine after going back home.   Visit Diagnosis:    ICD-10-CM   1. GAD (generalized anxiety disorder)  F41.1     2. MDD (major depressive disorder), recurrent episode, mild ([place])  F33.0     3. Benzodiazepine dependence (HCC)  F13.20       Past Psychiatric History: Please see initial evaluation for full details. I have reviewed the history. No updates at this time.     Past Medical History:  Past Medical History:  Diagnosis Date   Anxiety    Arthritis    Asthma    Chronic back pain    Chronic back pain    scoliosis  and 2 buldging disc   Depression    Diastolic dysfunction    Diverticulosis    Dysrhythmia    PALPITATIONS   Fibromyalgia    Gastritis    GERD (gastroesophageal reflux disease)    takes Protonix daily   Headache(784.0)    sinus related   Hyperlipidemia    just diagnosed;will follow up in 6wks to discuss meds   Hypertension    takes Lisinopril daily   Hypothyroidism    takes Synthroid daily   Ischemic colitis ([place])    Joint pain    Nocturia    Obesity    Vitamin D deficiency    just placed on Vit D this week    Past Surgical History:  Procedure Laterality Date   ABDOMINAL HYSTERECTOMY     APPENDECTOMY     BACK SURGERY     x 3   CARDIAC CATHETERIZATION  04/13/2010   normal cororanies, EF 55% ([facility]; Dr. Clayborn Bigness)   CHOLECYSTECTOMY     COLONOSCOPY     COLONOSCOPY WITH PROPOFOL N/A 01/21/2017   Procedure: COLONOSCOPY WITH PROPOFOL;  Surgeon: Lollie Sails, MD;  Location: [facility] ENDOSCOPY;  Service: Endoscopy;  Laterality: N/A;   CORONARY ANGIOPLASTY     ESOPHAGOGASTRODUODENOSCOPY  06/12/2011   ESOPHAGOGASTRODUODENOSCOPY N/A  08/09/2021   Procedure: ESOPHAGOGASTRODUODENOSCOPY (EGD);  Surgeon: Annamaria Helling, DO;  Location: [facility] ENDOSCOPY;  Service: Gastroenterology;  Laterality: N/A;   ESOPHAGOGASTRODUODENOSCOPY (EGD) WITH PROPOFOL N/A 01/21/2017   Procedure: ESOPHAGOGASTRODUODENOSCOPY (EGD) WITH PROPOFOL;  Surgeon: Lollie Sails, MD;  Location: [facility] ENDOSCOPY;  Service: Endoscopy;  Laterality: N/A;   FOOT SURGERY Left    d/t neuroma   OOPHORECTOMY     radiation to thyroid     RIGHT/LEFT HEART CATH AND CORONARY ANGIOGRAPHY Bilateral 10/22/2016   Procedure: RIGHT/LEFT HEART CATH AND CORONARY ANGIOGRAPHY;  Surgeon: Yolonda Kida, MD;  Location: [facility] CV LAB;  Service: Cardiovascular;  Laterality: Bilateral;   RIGHT/LEFT HEART CATH AND CORONARY ANGIOGRAPHY Bilateral 04/10/2022   Procedure: RIGHT/LEFT HEART CATH AND CORONARY ANGIOGRAPHY;  Surgeon:  Yolonda Kida, MD;  Location: [facility] CV LAB;  Service: Cardiovascular;  Laterality: Bilateral;   SPINAL CORD STIMULATOR INSERTION N/A 07/16/2013   Procedure: LUMBAR SPINAL CORD STIMULATOR INSERTION;  Surgeon: Bonna Gains, MD;  Location: [facility];  Service: Neurosurgery;  Laterality: N/A;   THORACIC DISCECTOMY Left 04/24/2018   Procedure: Left Thoracic ten- thoracic eleven Microdiscectomy, Removal of Spinal Cord Stimulator;  Surgeon: Kristeen Miss, MD;  Location: [facility];  Service: Neurosurgery;  Laterality: Left;  Left Thoracic ten- thoracic eleven Microdiscectomy, Removal of Spinal Cord Stimulator    Family Psychiatric History: Please see initial evaluation for full details. I have reviewed the history. No updates at this time.     Family History:  Family History  Problem Relation Age of Onset   Anxiety disorder Mother    Breast cancer Neg Hx     Social History:  Social History   Socioeconomic History   Marital status: Married    Spouse name: Not on file   Number of children: 4   Years of education: Not on file   Highest education level: Not on file  Occupational History   Not on file  Tobacco Use   Smoking status: Former    Packs/day: 0.50    Years: 0.00    Total pack years: 0.00    Types: Cigarettes    Quit date: 06/20/1986    Years since quitting: 35.8   Smokeless tobacco: Never  Vaping Use   Vaping Use: Never used  Substance and Sexual Activity   Alcohol use: No   Drug use: No   Sexual activity: Yes  Other Topics Concern   Not on file  Social History Narrative   Not on file   Social Determinants of Health   Financial Resource Strain: Not on file  Food Insecurity: Not on file  Transportation Needs: Not on file  Physical Activity: Not on file  Stress: Not on file  Social Connections: Not on file    Allergies: No Known Allergies  Metabolic Disorder Labs: No results found for: "HGBA1C", "MPG" No results found for: "PROLACTIN" Lab Results   Component Value Date   CHOL 182 09/04/2012   TRIG 137 09/04/2012   HDL 63 (H) 09/04/2012   VLDL 27 09/04/2012   LDLCALC 92 09/04/2012   Lab Results  Component Value Date   TSH 3.060 09/29/2021   TSH 3.872 10/18/2020    Therapeutic Level Labs: No results found for: "LITHIUM" No results found for: "VALPROATE" No results found for: "CBMZ"  Current Medications: Current Outpatient Medications  Medication Sig Dispense Refill   acetaminophen (TYLENOL) 325 MG tablet Take 650 mg by mouth every 6 (six) hours as needed for moderate pain.  amLODipine (NORVASC) 2.5 MG tablet Take 2.5 mg by mouth daily.     apixaban (ELIQUIS) 5 MG TABS tablet Take 2.5 mg by mouth 2 (two) times daily.     Chlorphen-Pseudoephed-APAP (TYLENOL ALLERGY SINUS PO) Take 1 tablet by mouth daily as needed (allergies).     clonazePAM (KLONOPIN) 0.5 MG tablet Take 1 tablet (0.5 mg total) by mouth 2 (two) times daily as needed for anxiety. 60 tablet 1   dicyclomine (BENTYL) 10 MG capsule Take 10 mg by mouth daily as needed for spasms.     Hyoscyamine Sulfate SL 0.125 MG SUBL Place 0.125 mg under the tongue daily as needed (stomach pain).     levothyroxine (SYNTHROID, LEVOTHROID) 75 MCG tablet Take 75 mcg by mouth daily before breakfast.     lisinopril (ZESTRIL) 20 MG tablet Take 20 mg by mouth daily.     LORazepam (ATIVAN) 0.5 MG tablet Take 1 tablet (0.5 mg total) by mouth 2 (two) times daily as needed for anxiety. 60 tablet 1   nitroGLYCERIN (NITROSTAT) 0.4 MG SL tablet Place 0.4 mg under the tongue every 5 (five) minutes as needed for chest pain.     pantoprazole (PROTONIX) 40 MG tablet Take 40 mg by mouth daily.     No current facility-administered medications for this visit.     Musculoskeletal: Strength & Muscle Tone: within normal limits Gait & Station: normal Patient leans: N/A  Psychiatric Specialty Exam: Review of Systems  Psychiatric/Behavioral:  Positive for decreased concentration, dysphoric mood  and sleep disturbance. Negative for agitation, behavioral problems, confusion, hallucinations, self-injury and suicidal ideas. The patient is nervous/anxious. The patient is not hyperactive.   All other systems reviewed and are negative.   Blood pressure 112/76, pulse 81, temperature 97.8 F (36.6 C), temperature source Skin, height 5' 5"$  (1.651 m).Body mass index is 34.78 kg/m.  General Appearance: Fairly Groomed  Eye Contact:  Good  Speech:  Clear and Coherent  Volume:  Normal  Mood:  Anxious  Affect:  Appropriate, Congruent, and tense  Thought Process:  Coherent  Orientation:  Full (Time, Place, and Person)  Thought Content: Logical   Suicidal Thoughts:  No  Homicidal Thoughts:  No  Memory:  Immediate;   Good  Judgement:  Good  Insight:  Good  Psychomotor Activity:  Normal  Concentration:  Concentration: Good and Attention Span: Good  Recall:  Good  Fund of Knowledge: Good  Language: Good  Akathisia:  No  Handed:  Right  AIMS (if indicated): not done  Assets:  Communication Skills Desire for Improvement  ADL's:  Intact  Cognition: WNL  Sleep:  Poor   Screenings: GAD-7    Physiological scientist Office Visit from 04/16/2022 in [place] Office Visit from 03/11/2022 in [place] Office Visit from 12/27/2021 in [place] Office Visit from 11/01/2021 in [place] Office Visit from 03/12/2021 in [place]  Total GAD-7 Score 18 19 8 8 12      $ PHQ2-9    [facility] Office Visit from 04/16/2022 in [place] Office Visit from 03/11/2022 in [place] Office Visit from 12/27/2021 in [place] Office Visit from 12/06/2021 in [place] [facility] Visit from 11/01/2021 in [place]  PHQ-2 Total Score 6 5 4  0 2  PHQ-9 Total Score 19 18 9 $ -- 9      Flowsheet Row ED from 04/03/2022 in [facility] Emergency Department at [facility] ED from 03/27/2022 in [facility] Emergency Department at [facility] ED from 03/20/2022 in [facility] Emergency Department at [place] No Risk No Risk No Risk        Assessment and Plan:  Madison Jennings is a 82 y.o. year old female with a history of anxiety,  hypertension, hypothyroidism, pAfib, bradycardia,  with history of QTc prolongation, reactive airway, GERD, fibromyalgia, Spinal stenosis of lumbar region without neurogenic claudication, who presents for follow up appointment for below.   1. GAD (generalized anxiety disorder) 2. MDD (major depressive disorder), recurrent episode, mild (HCC) 3. Benzodiazepine dependence (HCC) Unstable.  She continues to experience depressive symptoms and anxiety since the last visit.  It is discussed to try quetiapine to target anxiety and depression.  Discussed potential side effects which includes but not limited to metabolic side effect, QTc prolongation and drowsiness.  This medication is advised this time given she had adverse reaction to other antidepressant.  TCA is not a good option given her history of A-fib and bradycardia.  It is advised to continue on the current dose of Klonopin at this time given she had withdrawal symptoms when she tried to reduce in the past.   Her son verbalized understanding of the situation.  Although she will greatly benefit from CBT/ACT, she is not interested in this.    Plan Continue clonazepam 0,5 mg twice a day  (Consider starting quetiapine 12.5 mg at night if the patient is interested) EKG QTc 444 msec, HR 60, Afib with IRBBB. She agrees that this Probation officer contacts her cardiologist to ensure safety of  this medication. Will send a message about this. Next appointment: 4/22 at 9:30, IP - on Hydrocodone 5 mg twice a day (not taking at all)   Past trials of medication: sertraline, bupropion (headache), duloxetine (limited benefit), venlafaxine (palpitation, wheezing according to the patient), viibryd, mirtazapine (dream, increase in appetite), Buspar (headache, migraine),  gabapentin (increased in appetite), pregbalin (some neuralgia in her leg), valium     Collaboration of Care: Collaboration of Care: Other reviewed notes in Epic  Patient/Guardian was advised Release of Information must be obtained prior to any record release in order to collaborate their care with an outside provider. Patient/Guardian was advised if they have not already done so to contact the registration department to sign all necessary forms in order for [place] to release information regarding their care.   Consent: Patient/Guardian gives verbal consent for treatment and assignment of benefits for services provided during this visit. Patient/Guardian expressed understanding and agreed to proceed.   The duration of the time spent on the following activities on the date of the encounter was 30 minutes.   Preparing to see the patient (e.g., review of test, records)  Obtaining and/or reviewing separately obtained history  Performing a medically necessary exam and/or evaluation  Counseling and educating the patient/family/caregiver  Ordering medications, tests, or procedures  Referring and communicating with other healthcare professionals (when not reported separately)  Documenting clinical information in the electronic or paper health record  Independently interpreting results of tests/labs and communication of results to the family or caregiver  Care coordination (when not reported separately)    Norman Clay, MD 04/16/2022, 3:38 PM

## 2022-04-16 ENCOUNTER — Encounter: Payer: Self-pay | Admitting: Psychiatry

## 2022-04-16 ENCOUNTER — Ambulatory Visit: Payer: No Typology Code available for payment source | Admitting: Psychiatry

## 2022-04-16 VITALS — BP 112/76 | HR 81 | Temp 97.8°F | Ht 65.0 in

## 2022-04-16 DIAGNOSIS — F411 Generalized anxiety disorder: Secondary | ICD-10-CM

## 2022-04-16 DIAGNOSIS — F1320 Sedative, hypnotic or anxiolytic dependence, uncomplicated: Secondary | ICD-10-CM | POA: Diagnosis not present

## 2022-04-16 DIAGNOSIS — F330 Major depressive disorder, recurrent, mild: Secondary | ICD-10-CM | POA: Diagnosis not present

## 2022-04-22 DIAGNOSIS — F418 Other specified anxiety disorders: Secondary | ICD-10-CM | POA: Diagnosis not present

## 2022-04-22 DIAGNOSIS — N3001 Acute cystitis with hematuria: Secondary | ICD-10-CM | POA: Diagnosis not present

## 2022-04-24 ENCOUNTER — Telehealth: Payer: Self-pay

## 2022-04-24 ENCOUNTER — Other Ambulatory Visit: Payer: Self-pay | Admitting: Psychiatry

## 2022-04-24 DIAGNOSIS — E0789 Other specified disorders of thyroid: Secondary | ICD-10-CM | POA: Diagnosis not present

## 2022-04-24 DIAGNOSIS — Z3202 Encounter for pregnancy test, result negative: Secondary | ICD-10-CM | POA: Diagnosis not present

## 2022-04-24 DIAGNOSIS — F419 Anxiety disorder, unspecified: Secondary | ICD-10-CM | POA: Diagnosis not present

## 2022-04-24 DIAGNOSIS — F410 Panic disorder [episodic paroxysmal anxiety] without agoraphobia: Secondary | ICD-10-CM | POA: Diagnosis not present

## 2022-04-24 DIAGNOSIS — F1320 Sedative, hypnotic or anxiolytic dependence, uncomplicated: Secondary | ICD-10-CM | POA: Diagnosis not present

## 2022-04-24 DIAGNOSIS — I4891 Unspecified atrial fibrillation: Secondary | ICD-10-CM | POA: Diagnosis not present

## 2022-04-24 MED ORDER — QUETIAPINE FUMARATE 25 MG PO TABS: 12.5000 mg | ORAL_TABLET | Freq: Every day | ORAL | 1 refills | Status: AC

## 2022-04-24 NOTE — Telephone Encounter (Signed)
Her son was clear during her last visit about not sending quetiapine until she agrees to it. It sounds like she is open to taking the medication. The order has been sent.

## 2022-04-24 NOTE — Telephone Encounter (Signed)
pt left a message that you was supposed to have sent in a rx for the seroquel. the pharmacy does not have yet. please send to the [facility]

## 2022-04-25 DIAGNOSIS — G479 Sleep disorder, unspecified: Secondary | ICD-10-CM | POA: Diagnosis not present

## 2022-04-25 DIAGNOSIS — F411 Generalized anxiety disorder: Secondary | ICD-10-CM | POA: Diagnosis not present

## 2022-04-25 DIAGNOSIS — I10 Essential (primary) hypertension: Secondary | ICD-10-CM | POA: Diagnosis not present

## 2022-04-25 DIAGNOSIS — F1320 Sedative, hypnotic or anxiolytic dependence, uncomplicated: Secondary | ICD-10-CM | POA: Diagnosis not present

## 2022-04-25 DIAGNOSIS — R42 Dizziness and giddiness: Secondary | ICD-10-CM | POA: Diagnosis not present

## 2022-04-25 DIAGNOSIS — R519 Headache, unspecified: Secondary | ICD-10-CM | POA: Diagnosis not present

## 2022-04-25 DIAGNOSIS — E079 Disorder of thyroid, unspecified: Secondary | ICD-10-CM | POA: Diagnosis not present

## 2022-04-25 DIAGNOSIS — Z79899 Other long term (current) drug therapy: Secondary | ICD-10-CM | POA: Diagnosis not present

## 2022-04-25 DIAGNOSIS — Z7901 Long term (current) use of anticoagulants: Secondary | ICD-10-CM | POA: Diagnosis not present

## 2022-04-25 DIAGNOSIS — F331 Major depressive disorder, recurrent, moderate: Secondary | ICD-10-CM | POA: Diagnosis not present

## 2022-04-25 DIAGNOSIS — K219 Gastro-esophageal reflux disease without esophagitis: Secondary | ICD-10-CM | POA: Diagnosis not present

## 2022-04-26 DIAGNOSIS — F419 Anxiety disorder, unspecified: Secondary | ICD-10-CM | POA: Diagnosis not present

## 2022-04-26 DIAGNOSIS — F13939 Sedative, hypnotic or anxiolytic use, unspecified with withdrawal, unspecified: Secondary | ICD-10-CM | POA: Diagnosis not present

## 2022-04-26 DIAGNOSIS — F321 Major depressive disorder, single episode, moderate: Secondary | ICD-10-CM | POA: Diagnosis not present

## 2022-04-26 DIAGNOSIS — F1320 Sedative, hypnotic or anxiolytic dependence, uncomplicated: Secondary | ICD-10-CM | POA: Diagnosis not present

## 2022-04-27 DIAGNOSIS — E6609 Other obesity due to excess calories: Secondary | ICD-10-CM | POA: Diagnosis not present

## 2022-04-27 DIAGNOSIS — F13939 Sedative, hypnotic or anxiolytic use, unspecified with withdrawal, unspecified: Secondary | ICD-10-CM | POA: Diagnosis not present

## 2022-04-27 DIAGNOSIS — F419 Anxiety disorder, unspecified: Secondary | ICD-10-CM | POA: Diagnosis not present

## 2022-04-27 DIAGNOSIS — E079 Disorder of thyroid, unspecified: Secondary | ICD-10-CM | POA: Diagnosis not present

## 2022-04-27 DIAGNOSIS — I480 Paroxysmal atrial fibrillation: Secondary | ICD-10-CM | POA: Diagnosis not present

## 2022-04-29 DIAGNOSIS — I480 Paroxysmal atrial fibrillation: Secondary | ICD-10-CM | POA: Diagnosis not present

## 2022-04-29 DIAGNOSIS — E039 Hypothyroidism, unspecified: Secondary | ICD-10-CM | POA: Diagnosis not present

## 2022-04-29 DIAGNOSIS — F13939 Sedative, hypnotic or anxiolytic use, unspecified with withdrawal, unspecified: Secondary | ICD-10-CM | POA: Diagnosis not present

## 2022-04-29 DIAGNOSIS — K649 Unspecified hemorrhoids: Secondary | ICD-10-CM | POA: Diagnosis not present

## 2022-04-30 DIAGNOSIS — F332 Major depressive disorder, recurrent severe without psychotic features: Secondary | ICD-10-CM | POA: Diagnosis not present

## 2022-05-01 DIAGNOSIS — I480 Paroxysmal atrial fibrillation: Secondary | ICD-10-CM | POA: Diagnosis not present

## 2022-05-01 DIAGNOSIS — F419 Anxiety disorder, unspecified: Secondary | ICD-10-CM | POA: Diagnosis not present

## 2022-05-01 DIAGNOSIS — E079 Disorder of thyroid, unspecified: Secondary | ICD-10-CM | POA: Diagnosis not present

## 2022-05-01 DIAGNOSIS — F411 Generalized anxiety disorder: Secondary | ICD-10-CM | POA: Diagnosis not present

## 2022-05-01 DIAGNOSIS — F13939 Sedative, hypnotic or anxiolytic use, unspecified with withdrawal, unspecified: Secondary | ICD-10-CM | POA: Diagnosis not present

## 2022-05-01 DIAGNOSIS — F321 Major depressive disorder, single episode, moderate: Secondary | ICD-10-CM | POA: Diagnosis not present

## 2022-05-01 DIAGNOSIS — E6609 Other obesity due to excess calories: Secondary | ICD-10-CM | POA: Diagnosis not present

## 2022-05-03 DIAGNOSIS — F13939 Sedative, hypnotic or anxiolytic use, unspecified with withdrawal, unspecified: Secondary | ICD-10-CM | POA: Diagnosis not present

## 2022-05-03 DIAGNOSIS — I480 Paroxysmal atrial fibrillation: Secondary | ICD-10-CM | POA: Diagnosis not present

## 2022-05-03 DIAGNOSIS — F419 Anxiety disorder, unspecified: Secondary | ICD-10-CM | POA: Diagnosis not present

## 2022-05-03 DIAGNOSIS — F411 Generalized anxiety disorder: Secondary | ICD-10-CM | POA: Diagnosis not present

## 2022-05-03 DIAGNOSIS — F321 Major depressive disorder, single episode, moderate: Secondary | ICD-10-CM | POA: Diagnosis not present

## 2022-05-03 DIAGNOSIS — K649 Unspecified hemorrhoids: Secondary | ICD-10-CM | POA: Diagnosis not present

## 2022-05-03 DIAGNOSIS — E6609 Other obesity due to excess calories: Secondary | ICD-10-CM | POA: Diagnosis not present

## 2022-05-04 DIAGNOSIS — F332 Major depressive disorder, recurrent severe without psychotic features: Secondary | ICD-10-CM | POA: Diagnosis not present

## 2022-05-05 DIAGNOSIS — F419 Anxiety disorder, unspecified: Secondary | ICD-10-CM | POA: Diagnosis not present

## 2022-05-05 DIAGNOSIS — F13939 Sedative, hypnotic or anxiolytic use, unspecified with withdrawal, unspecified: Secondary | ICD-10-CM | POA: Diagnosis not present

## 2022-05-05 DIAGNOSIS — F332 Major depressive disorder, recurrent severe without psychotic features: Secondary | ICD-10-CM | POA: Diagnosis not present

## 2022-05-05 DIAGNOSIS — K219 Gastro-esophageal reflux disease without esophagitis: Secondary | ICD-10-CM | POA: Diagnosis not present

## 2022-05-06 DIAGNOSIS — F1320 Sedative, hypnotic or anxiolytic dependence, uncomplicated: Secondary | ICD-10-CM | POA: Diagnosis not present

## 2022-05-06 DIAGNOSIS — F321 Major depressive disorder, single episode, moderate: Secondary | ICD-10-CM | POA: Diagnosis not present

## 2022-05-07 DIAGNOSIS — F419 Anxiety disorder, unspecified: Secondary | ICD-10-CM | POA: Diagnosis not present

## 2022-05-07 DIAGNOSIS — I480 Paroxysmal atrial fibrillation: Secondary | ICD-10-CM | POA: Diagnosis not present

## 2022-05-07 DIAGNOSIS — E6609 Other obesity due to excess calories: Secondary | ICD-10-CM | POA: Diagnosis not present

## 2022-05-07 DIAGNOSIS — K649 Unspecified hemorrhoids: Secondary | ICD-10-CM | POA: Diagnosis not present

## 2022-05-09 DIAGNOSIS — E6609 Other obesity due to excess calories: Secondary | ICD-10-CM | POA: Diagnosis not present

## 2022-05-09 DIAGNOSIS — F419 Anxiety disorder, unspecified: Secondary | ICD-10-CM | POA: Diagnosis not present

## 2022-05-09 DIAGNOSIS — K649 Unspecified hemorrhoids: Secondary | ICD-10-CM | POA: Diagnosis not present

## 2022-05-09 DIAGNOSIS — I480 Paroxysmal atrial fibrillation: Secondary | ICD-10-CM | POA: Diagnosis not present

## 2022-05-10 DIAGNOSIS — F1320 Sedative, hypnotic or anxiolytic dependence, uncomplicated: Secondary | ICD-10-CM | POA: Diagnosis not present

## 2022-05-10 DIAGNOSIS — F321 Major depressive disorder, single episode, moderate: Secondary | ICD-10-CM | POA: Diagnosis not present

## 2022-05-11 DIAGNOSIS — K649 Unspecified hemorrhoids: Secondary | ICD-10-CM | POA: Diagnosis not present

## 2022-05-11 DIAGNOSIS — F419 Anxiety disorder, unspecified: Secondary | ICD-10-CM | POA: Diagnosis not present

## 2022-05-11 DIAGNOSIS — I480 Paroxysmal atrial fibrillation: Secondary | ICD-10-CM | POA: Diagnosis not present

## 2022-05-11 DIAGNOSIS — E6609 Other obesity due to excess calories: Secondary | ICD-10-CM | POA: Diagnosis not present

## 2022-05-13 DIAGNOSIS — K649 Unspecified hemorrhoids: Secondary | ICD-10-CM | POA: Diagnosis not present

## 2022-05-13 DIAGNOSIS — E039 Hypothyroidism, unspecified: Secondary | ICD-10-CM | POA: Diagnosis not present

## 2022-05-13 DIAGNOSIS — F13939 Sedative, hypnotic or anxiolytic use, unspecified with withdrawal, unspecified: Secondary | ICD-10-CM | POA: Diagnosis not present

## 2022-05-13 DIAGNOSIS — F332 Major depressive disorder, recurrent severe without psychotic features: Secondary | ICD-10-CM | POA: Diagnosis not present

## 2022-05-13 DIAGNOSIS — I480 Paroxysmal atrial fibrillation: Secondary | ICD-10-CM | POA: Diagnosis not present

## 2022-05-14 DIAGNOSIS — F332 Major depressive disorder, recurrent severe without psychotic features: Secondary | ICD-10-CM | POA: Diagnosis not present

## 2022-05-15 DIAGNOSIS — K649 Unspecified hemorrhoids: Secondary | ICD-10-CM | POA: Diagnosis not present

## 2022-05-15 DIAGNOSIS — F419 Anxiety disorder, unspecified: Secondary | ICD-10-CM | POA: Diagnosis not present

## 2022-05-15 DIAGNOSIS — I480 Paroxysmal atrial fibrillation: Secondary | ICD-10-CM | POA: Diagnosis not present

## 2022-05-15 DIAGNOSIS — F332 Major depressive disorder, recurrent severe without psychotic features: Secondary | ICD-10-CM | POA: Diagnosis not present

## 2022-05-15 DIAGNOSIS — E6609 Other obesity due to excess calories: Secondary | ICD-10-CM | POA: Diagnosis not present

## 2022-05-16 DIAGNOSIS — F13939 Sedative, hypnotic or anxiolytic use, unspecified with withdrawal, unspecified: Secondary | ICD-10-CM | POA: Diagnosis not present

## 2022-05-16 DIAGNOSIS — I10 Essential (primary) hypertension: Secondary | ICD-10-CM | POA: Diagnosis not present

## 2022-05-16 DIAGNOSIS — E039 Hypothyroidism, unspecified: Secondary | ICD-10-CM | POA: Diagnosis not present

## 2022-05-16 DIAGNOSIS — E079 Disorder of thyroid, unspecified: Secondary | ICD-10-CM | POA: Diagnosis not present

## 2022-05-17 DIAGNOSIS — E039 Hypothyroidism, unspecified: Secondary | ICD-10-CM | POA: Diagnosis not present

## 2022-05-17 DIAGNOSIS — E559 Vitamin D deficiency, unspecified: Secondary | ICD-10-CM | POA: Diagnosis not present

## 2022-05-17 DIAGNOSIS — I10 Essential (primary) hypertension: Secondary | ICD-10-CM | POA: Diagnosis not present

## 2022-05-17 DIAGNOSIS — R7303 Prediabetes: Secondary | ICD-10-CM | POA: Diagnosis not present

## 2022-05-21 DIAGNOSIS — E559 Vitamin D deficiency, unspecified: Secondary | ICD-10-CM | POA: Diagnosis not present

## 2022-05-21 DIAGNOSIS — F418 Other specified anxiety disorders: Secondary | ICD-10-CM | POA: Diagnosis not present

## 2022-05-21 DIAGNOSIS — Z79890 Hormone replacement therapy (postmenopausal): Secondary | ICD-10-CM | POA: Diagnosis not present

## 2022-05-21 DIAGNOSIS — G4700 Insomnia, unspecified: Secondary | ICD-10-CM | POA: Diagnosis not present

## 2022-05-21 DIAGNOSIS — E039 Hypothyroidism, unspecified: Secondary | ICD-10-CM | POA: Diagnosis not present

## 2022-05-21 DIAGNOSIS — R7303 Prediabetes: Secondary | ICD-10-CM | POA: Diagnosis not present

## 2022-05-21 DIAGNOSIS — Z758 Other problems related to medical facilities and other health care: Secondary | ICD-10-CM | POA: Diagnosis not present

## 2022-06-04 DIAGNOSIS — R7303 Prediabetes: Secondary | ICD-10-CM | POA: Diagnosis not present

## 2022-06-04 DIAGNOSIS — G4700 Insomnia, unspecified: Secondary | ICD-10-CM | POA: Diagnosis not present

## 2022-06-04 DIAGNOSIS — F418 Other specified anxiety disorders: Secondary | ICD-10-CM | POA: Diagnosis not present

## 2022-06-04 DIAGNOSIS — Z86718 Personal history of other venous thrombosis and embolism: Secondary | ICD-10-CM | POA: Diagnosis not present

## 2022-06-04 DIAGNOSIS — I4891 Unspecified atrial fibrillation: Secondary | ICD-10-CM | POA: Diagnosis not present

## 2022-06-04 DIAGNOSIS — R5382 Chronic fatigue, unspecified: Secondary | ICD-10-CM | POA: Diagnosis not present

## 2022-06-04 DIAGNOSIS — N1831 Chronic kidney disease, stage 3a: Secondary | ICD-10-CM | POA: Diagnosis not present

## 2022-06-04 DIAGNOSIS — Z8673 Personal history of transient ischemic attack (TIA), and cerebral infarction without residual deficits: Secondary | ICD-10-CM | POA: Diagnosis not present

## 2022-06-04 DIAGNOSIS — K219 Gastro-esophageal reflux disease without esophagitis: Secondary | ICD-10-CM | POA: Diagnosis not present

## 2022-06-04 DIAGNOSIS — R0609 Other forms of dyspnea: Secondary | ICD-10-CM | POA: Diagnosis not present

## 2022-06-04 DIAGNOSIS — I119 Hypertensive heart disease without heart failure: Secondary | ICD-10-CM | POA: Diagnosis not present

## 2022-06-04 DIAGNOSIS — Z758 Other problems related to medical facilities and other health care: Secondary | ICD-10-CM | POA: Diagnosis not present

## 2022-06-04 DIAGNOSIS — F410 Panic disorder [episodic paroxysmal anxiety] without agoraphobia: Secondary | ICD-10-CM | POA: Diagnosis not present

## 2022-06-04 DIAGNOSIS — R531 Weakness: Secondary | ICD-10-CM | POA: Diagnosis not present

## 2022-06-04 DIAGNOSIS — F1320 Sedative, hypnotic or anxiolytic dependence, uncomplicated: Secondary | ICD-10-CM | POA: Diagnosis not present

## 2022-06-04 DIAGNOSIS — E782 Mixed hyperlipidemia: Secondary | ICD-10-CM | POA: Diagnosis not present

## 2022-06-04 DIAGNOSIS — G894 Chronic pain syndrome: Secondary | ICD-10-CM | POA: Diagnosis not present

## 2022-06-04 DIAGNOSIS — E669 Obesity, unspecified: Secondary | ICD-10-CM | POA: Diagnosis not present

## 2022-06-04 DIAGNOSIS — F1120 Opioid dependence, uncomplicated: Secondary | ICD-10-CM | POA: Diagnosis not present

## 2022-06-04 DIAGNOSIS — F419 Anxiety disorder, unspecified: Secondary | ICD-10-CM | POA: Diagnosis not present

## 2022-06-04 DIAGNOSIS — R609 Edema, unspecified: Secondary | ICD-10-CM | POA: Diagnosis not present

## 2022-06-04 DIAGNOSIS — E039 Hypothyroidism, unspecified: Secondary | ICD-10-CM | POA: Diagnosis not present

## 2022-06-04 DIAGNOSIS — E559 Vitamin D deficiency, unspecified: Secondary | ICD-10-CM | POA: Diagnosis not present

## 2022-06-04 DIAGNOSIS — I10 Essential (primary) hypertension: Secondary | ICD-10-CM | POA: Diagnosis not present

## 2022-06-12 NOTE — Progress Notes (Deleted)
BH MD/PA/NP OP Progress Note  06/12/2022 1:48 PM Madison Jennings  MRN:  [id number]  Chief Complaint: No chief complaint on file.  HPI: *** - she was admitted to [facility] for detox from clonazepam. Sertraline 75 mg, olanzapine 2.5 mg twice a day, mirtazapine 7.5 mg at night, buspar 7.5 mg three times a day were added during the admission.   Requested to be seen in [place]  Loss of her youngest son 7 years ago  Visit Diagnosis: No diagnosis found.  Past Psychiatric History: Please see initial evaluation for full details. I have reviewed the history. No updates at this time.     Past Medical History:  Past Medical History:  Diagnosis Date   Anxiety    Arthritis    Asthma    Chronic back pain    Chronic back pain    scoliosis and 2 buldging disc   Depression    Diastolic dysfunction    Diverticulosis    Dysrhythmia    PALPITATIONS   Fibromyalgia    Gastritis    GERD (gastroesophageal reflux disease)    takes Protonix daily   Headache(784.0)    sinus related   Hyperlipidemia    just diagnosed;will follow up in 6wks to discuss meds   Hypertension    takes Lisinopril daily   Hypothyroidism    takes Synthroid daily   Ischemic colitis (HCC)    Joint pain    Nocturia    Obesity    Vitamin D deficiency    just placed on Vit D this week    Past Surgical History:  Procedure Laterality Date   ABDOMINAL HYSTERECTOMY     APPENDECTOMY     BACK SURGERY     x 3   CARDIAC CATHETERIZATION  04/13/2010   normal cororanies, EF 55% ([facility]; Dr. Juliann Pares)   CHOLECYSTECTOMY     COLONOSCOPY     COLONOSCOPY WITH PROPOFOL N/A 01/21/2017   Procedure: COLONOSCOPY WITH PROPOFOL;  Surgeon: Christena Deem, MD;  Location: [facility] ENDOSCOPY;  Service: Endoscopy;  Laterality: N/A;   CORONARY ANGIOPLASTY     ESOPHAGOGASTRODUODENOSCOPY  06/12/2011   ESOPHAGOGASTRODUODENOSCOPY N/A 08/09/2021   Procedure: ESOPHAGOGASTRODUODENOSCOPY (EGD);  Surgeon: Jaynie Collins, DO;  Location:  [facility] ENDOSCOPY;  Service: Gastroenterology;  Laterality: N/A;   ESOPHAGOGASTRODUODENOSCOPY (EGD) WITH PROPOFOL N/A 01/21/2017   Procedure: ESOPHAGOGASTRODUODENOSCOPY (EGD) WITH PROPOFOL;  Surgeon: Christena Deem, MD;  Location: [facility] ENDOSCOPY;  Service: Endoscopy;  Laterality: N/A;   FOOT SURGERY Left    d/t neuroma   OOPHORECTOMY     radiation to thyroid     RIGHT/LEFT HEART CATH AND CORONARY ANGIOGRAPHY Bilateral 10/22/2016   Procedure: RIGHT/LEFT HEART CATH AND CORONARY ANGIOGRAPHY;  Surgeon: Alwyn Pea, MD;  Location: [facility] INVASIVE CV LAB;  Service: Cardiovascular;  Laterality: Bilateral;   RIGHT/LEFT HEART CATH AND CORONARY ANGIOGRAPHY Bilateral 04/10/2022   Procedure: RIGHT/LEFT HEART CATH AND CORONARY ANGIOGRAPHY;  Surgeon: Alwyn Pea, MD;  Location: [facility] INVASIVE CV LAB;  Service: Cardiovascular;  Laterality: Bilateral;   SPINAL CORD STIMULATOR INSERTION N/A 07/16/2013   Procedure: LUMBAR SPINAL CORD STIMULATOR INSERTION;  Surgeon: Gwynne Edinger, MD;  Location: [facility] OR;  Service: Neurosurgery;  Laterality: N/A;   THORACIC DISCECTOMY Left 04/24/2018   Procedure: Left Thoracic ten- thoracic eleven Microdiscectomy, Removal of Spinal Cord Stimulator;  Surgeon: Barnett Abu, MD;  Location: [facility] OR;  Service: Neurosurgery;  Laterality: Left;  Left Thoracic ten- thoracic eleven Microdiscectomy, Removal of Spinal Cord Stimulator  Family Psychiatric History: Please see initial evaluation for full details. I have reviewed the history. No updates at this time.     Family History:  Family History  Problem Relation Age of Onset   Anxiety disorder Mother    Breast cancer Neg Hx     Social History:  Social History   Socioeconomic History   Marital status: Married    Spouse name: Not on file   Number of children: 4   Years of education: Not on file   Highest education level: Not on file  Occupational History   Not on file  Tobacco Use   Smoking status: Former     Packs/day: 0.50    Years: 0.00    Additional pack years: 0.00    Total pack years: 0.00    Types: Cigarettes    Quit date: 06/20/1986    Years since quitting: 36.0   Smokeless tobacco: Never  Vaping Use   Vaping Use: Never used  Substance and Sexual Activity   Alcohol use: No   Drug use: No   Sexual activity: Yes  Other Topics Concern   Not on file  Social History Narrative   Not on file   Social Determinants of Health   Financial Resource Strain: Not on file  Food Insecurity: Not on file  Transportation Needs: Not on file  Physical Activity: Not on file  Stress: Not on file  Social Connections: Not on file    Allergies: No Known Allergies  Metabolic Disorder Labs: No results found for: "HGBA1C", "MPG" No results found for: "PROLACTIN" Lab Results  Component Value Date   CHOL 182 09/04/2012   TRIG 137 09/04/2012   HDL 63 (H) 09/04/2012   VLDL 27 09/04/2012   LDLCALC 92 09/04/2012   Lab Results  Component Value Date   TSH 3.060 09/29/2021   TSH 3.872 10/18/2020    Therapeutic Level Labs: No results found for: "LITHIUM" No results found for: "VALPROATE" No results found for: "CBMZ"  Current Medications: Current Outpatient Medications  Medication Sig Dispense Refill   acetaminophen (TYLENOL) 325 MG tablet Take 650 mg by mouth every 6 (six) hours as needed for moderate pain.     amLODipine (NORVASC) 2.5 MG tablet Take 2.5 mg by mouth daily.     apixaban (ELIQUIS) 5 MG TABS tablet Take 2.5 mg by mouth 2 (two) times daily.     Chlorphen-Pseudoephed-APAP (TYLENOL ALLERGY SINUS PO) Take 1 tablet by mouth daily as needed (allergies).     clonazePAM (KLONOPIN) 0.5 MG tablet Take 1 tablet (0.5 mg total) by mouth 2 (two) times daily as needed for anxiety. 60 tablet 1   dicyclomine (BENTYL) 10 MG capsule Take 10 mg by mouth daily as needed for spasms.     Hyoscyamine Sulfate SL 0.125 MG SUBL Place 0.125 mg under the tongue daily as needed (stomach pain).      levothyroxine (SYNTHROID, LEVOTHROID) 75 MCG tablet Take 75 mcg by mouth daily before breakfast.     lisinopril (ZESTRIL) 20 MG tablet Take 20 mg by mouth daily.     nitroGLYCERIN (NITROSTAT) 0.4 MG SL tablet Place 0.4 mg under the tongue every 5 (five) minutes as needed for chest pain.     pantoprazole (PROTONIX) 40 MG tablet Take 40 mg by mouth daily.     QUEtiapine (SEROQUEL) 25 MG tablet Take 0.5 tablets (12.5 mg total) by mouth at bedtime. 15 tablet 1   No current facility-administered medications for this visit.  Musculoskeletal: Strength & Muscle Tone: within normal limits Gait & Station: normal Patient leans: N/A  Psychiatric Specialty Exam: Review of Systems  There were no vitals taken for this visit.There is no height or weight on file to calculate BMI.  General Appearance: {Appearance:22683}  Eye Contact:  {BHH EYE CONTACT:22684}  Speech:  Clear and Coherent  Volume:  Normal  Mood:  {BHH MOOD:22306}  Affect:  {Affect (PAA):22687}  Thought Process:  Coherent  Orientation:  Full (Time, Place, and Person)  Thought Content: Logical   Suicidal Thoughts:  {ST/HT (PAA):22692}  Homicidal Thoughts:  {ST/HT (PAA):22692}  Memory:  Immediate;   Good  Judgement:  {Judgement (PAA):22694}  Insight:  {Insight (PAA):22695}  Psychomotor Activity:  Normal  Concentration:  Concentration: Good and Attention Span: Good  Recall:  Good  Fund of Knowledge: Good  Language: Good  Akathisia:  No  Handed:  Right  AIMS (if indicated): not done  Assets:  Communication Skills Desire for Improvement  ADL's:  Intact  Cognition: WNL  Sleep:  {B[facility] GOOD/FAIR/POOR:22877}   Screenings: GAD-7    Flowsheet Row Office Visit from 04/16/2022 in [facility] Office Visit from 03/11/2022 in [facility] Visit from 12/27/2021 in [facility] Office Visit from 11/01/2021 in [facility] Office Visit from 03/12/2021 in [facility]  Total GAD-7 Score 18 19 8 8 12       PHQ2-9    Flowsheet Row Office Visit from 04/16/2022 in [facility] Office Visit from 03/11/2022 in [facility] Office Visit from 12/27/2021 in [facility] Office Visit from 12/06/2021 in [facility] Visit from 11/01/2021 in [facility]  PHQ-2 Total Score 6 5 4  0 2  PHQ-9 Total Score 19 18 9  -- 9      Flowsheet Row ED from 04/03/2022 in [facility] Department at [facility] ED from 03/27/2022 in [facility] Department at [facility] ED from 03/20/2022 in [facility] Department at [facility]  C-SSRS RISK CATEGORY No Risk No Risk No Risk        Assessment and Plan:  Madison Jennings is a 82 y.o. year old female with a history of anxiety,  hypertension, hypothyroidism, pAfib, bradycardia,  with history of QTc prolongation, reactive airway, GERD, fibromyalgia, Spinal stenosis of lumbar region without neurogenic claudication, who presents for follow up appointment for below.    1. GAD (generalized anxiety disorder) 2. MDD (major depressive disorder), recurrent episode, mild (HCC) 3. Benzodiazepine dependence (HCC) Unstable.  She continues to experience depressive symptoms and anxiety since the last visit.  It is discussed to try quetiapine to target anxiety and depression.  Discussed potential side effects which includes but not limited to metabolic side effect, QTc prolongation and drowsiness.  This medication is advised this time given she had adverse reaction to other antidepressant.  TCA is not a good option given her history of A-fib and bradycardia.  It is  advised to continue on the current dose of Klonopin at this time given she had withdrawal symptoms when she tried to reduce in the past.   Her son verbalized understanding of the situation.  Although she will greatly benefit from CBT/ACT, she is not interested in this.    Plan 1. Continue clonazepam  0,5 mg twice a day  2. (Consider starting quetiapine 12.5 mg at night if the patient is interested) EKG QTc 444 msec, HR 60, Afib with IRBBB. She agrees that this Clinical research associate contacts her cardiologist to ensure safety of this medication. Will send a message about this. 3. Next appointment: 4/22 at 9:30, IP - on Hydrocodone 5 mg twice a day (not taking at all)  EKG Afib 449 msec, HR 78   Past trials of medication: sertraline, bupropion (headache), duloxetine (limited benefit), venlafaxine (palpitation, wheezing according to the patient), viibryd, mirtazapine (dream, increase in appetite), Buspar (headache, migraine),  gabapentin (increased in appetite), pregbalin (some neuralgia in her leg), valium     Collaboration of Care: Collaboration of Care: {BH OP Collaboration of Care:21014065}  Patient/Guardian was advised Release of Information must be obtained prior to any record release in order to collaborate their care with an outside provider. Patient/Guardian was advised if they have not already done so to contact the registration department to sign all necessary forms in order for [place] to release information regarding their care.   Consent: Patient/Guardian gives verbal consent for treatment and assignment of benefits for services provided during this visit. Patient/Guardian expressed understanding and agreed to proceed.    Neysa Hotter, MD 06/12/2022, 1:48 PM

## 2022-06-17 ENCOUNTER — Ambulatory Visit: Payer: No Typology Code available for payment source | Admitting: Psychiatry

## 2022-06-24 DIAGNOSIS — F1120 Opioid dependence, uncomplicated: Secondary | ICD-10-CM | POA: Diagnosis not present

## 2022-06-24 DIAGNOSIS — E0789 Other specified disorders of thyroid: Secondary | ICD-10-CM | POA: Diagnosis not present

## 2022-06-24 DIAGNOSIS — F1320 Sedative, hypnotic or anxiolytic dependence, uncomplicated: Secondary | ICD-10-CM | POA: Diagnosis not present

## 2022-06-24 DIAGNOSIS — F410 Panic disorder [episodic paroxysmal anxiety] without agoraphobia: Secondary | ICD-10-CM | POA: Diagnosis not present

## 2022-06-24 DIAGNOSIS — I4891 Unspecified atrial fibrillation: Secondary | ICD-10-CM | POA: Diagnosis not present

## 2022-06-24 DIAGNOSIS — F419 Anxiety disorder, unspecified: Secondary | ICD-10-CM | POA: Diagnosis not present

## 2022-07-18 DIAGNOSIS — M545 Low back pain, unspecified: Secondary | ICD-10-CM | POA: Diagnosis not present

## 2022-07-18 DIAGNOSIS — R109 Unspecified abdominal pain: Secondary | ICD-10-CM | POA: Diagnosis not present

## 2022-08-12 DIAGNOSIS — F410 Panic disorder [episodic paroxysmal anxiety] without agoraphobia: Secondary | ICD-10-CM | POA: Diagnosis not present

## 2022-08-12 DIAGNOSIS — F1120 Opioid dependence, uncomplicated: Secondary | ICD-10-CM | POA: Diagnosis not present

## 2022-08-12 DIAGNOSIS — E0789 Other specified disorders of thyroid: Secondary | ICD-10-CM | POA: Diagnosis not present

## 2022-08-12 DIAGNOSIS — F1320 Sedative, hypnotic or anxiolytic dependence, uncomplicated: Secondary | ICD-10-CM | POA: Diagnosis not present

## 2022-08-12 DIAGNOSIS — F419 Anxiety disorder, unspecified: Secondary | ICD-10-CM | POA: Diagnosis not present

## 2022-08-12 DIAGNOSIS — I4891 Unspecified atrial fibrillation: Secondary | ICD-10-CM | POA: Diagnosis not present

## 2022-09-27 DIAGNOSIS — E559 Vitamin D deficiency, unspecified: Secondary | ICD-10-CM | POA: Diagnosis not present

## 2022-09-27 DIAGNOSIS — I10 Essential (primary) hypertension: Secondary | ICD-10-CM | POA: Diagnosis not present

## 2022-09-27 DIAGNOSIS — E039 Hypothyroidism, unspecified: Secondary | ICD-10-CM | POA: Diagnosis not present

## 2022-09-27 DIAGNOSIS — R7303 Prediabetes: Secondary | ICD-10-CM | POA: Diagnosis not present

## 2022-10-03 DIAGNOSIS — E559 Vitamin D deficiency, unspecified: Secondary | ICD-10-CM | POA: Diagnosis not present

## 2022-10-03 DIAGNOSIS — F330 Major depressive disorder, recurrent, mild: Secondary | ICD-10-CM | POA: Diagnosis not present

## 2022-10-03 DIAGNOSIS — R7303 Prediabetes: Secondary | ICD-10-CM | POA: Diagnosis not present

## 2022-10-03 DIAGNOSIS — G894 Chronic pain syndrome: Secondary | ICD-10-CM | POA: Diagnosis not present

## 2022-10-03 DIAGNOSIS — E039 Hypothyroidism, unspecified: Secondary | ICD-10-CM | POA: Diagnosis not present

## 2022-10-03 DIAGNOSIS — E782 Mixed hyperlipidemia: Secondary | ICD-10-CM | POA: Diagnosis not present

## 2022-10-03 DIAGNOSIS — K219 Gastro-esophageal reflux disease without esophagitis: Secondary | ICD-10-CM | POA: Diagnosis not present

## 2022-10-03 DIAGNOSIS — I119 Hypertensive heart disease without heart failure: Secondary | ICD-10-CM | POA: Diagnosis not present

## 2022-10-03 DIAGNOSIS — F13130 Sedative, hypnotic or anxiolytic abuse with withdrawal, uncomplicated: Secondary | ICD-10-CM | POA: Diagnosis not present

## 2022-10-03 DIAGNOSIS — G4700 Insomnia, unspecified: Secondary | ICD-10-CM | POA: Diagnosis not present

## 2022-10-03 DIAGNOSIS — R944 Abnormal results of kidney function studies: Secondary | ICD-10-CM | POA: Diagnosis not present

## 2022-10-15 DIAGNOSIS — R110 Nausea: Secondary | ICD-10-CM | POA: Diagnosis not present

## 2022-10-15 DIAGNOSIS — H6983 Other specified disorders of Eustachian tube, bilateral: Secondary | ICD-10-CM | POA: Diagnosis not present

## 2022-10-15 DIAGNOSIS — H8111 Benign paroxysmal vertigo, right ear: Secondary | ICD-10-CM | POA: Diagnosis not present

## 2022-10-21 DIAGNOSIS — Z79899 Other long term (current) drug therapy: Secondary | ICD-10-CM | POA: Diagnosis not present

## 2022-10-21 DIAGNOSIS — I10 Essential (primary) hypertension: Secondary | ICD-10-CM | POA: Diagnosis not present

## 2022-10-21 DIAGNOSIS — I119 Hypertensive heart disease without heart failure: Secondary | ICD-10-CM | POA: Diagnosis not present

## 2022-10-30 DIAGNOSIS — H903 Sensorineural hearing loss, bilateral: Secondary | ICD-10-CM | POA: Diagnosis not present

## 2022-10-30 DIAGNOSIS — H8112 Benign paroxysmal vertigo, left ear: Secondary | ICD-10-CM | POA: Diagnosis not present

## 2022-11-11 DIAGNOSIS — I10 Essential (primary) hypertension: Secondary | ICD-10-CM | POA: Diagnosis not present

## 2022-11-11 DIAGNOSIS — J069 Acute upper respiratory infection, unspecified: Secondary | ICD-10-CM | POA: Diagnosis not present

## 2022-12-03 DIAGNOSIS — R0609 Other forms of dyspnea: Secondary | ICD-10-CM | POA: Diagnosis not present

## 2022-12-03 DIAGNOSIS — R531 Weakness: Secondary | ICD-10-CM | POA: Diagnosis not present

## 2022-12-03 DIAGNOSIS — Z8673 Personal history of transient ischemic attack (TIA), and cerebral infarction without residual deficits: Secondary | ICD-10-CM | POA: Diagnosis not present

## 2022-12-03 DIAGNOSIS — Z8679 Personal history of other diseases of the circulatory system: Secondary | ICD-10-CM | POA: Diagnosis not present

## 2022-12-03 DIAGNOSIS — N1831 Chronic kidney disease, stage 3a: Secondary | ICD-10-CM | POA: Diagnosis not present

## 2022-12-03 DIAGNOSIS — I10 Essential (primary) hypertension: Secondary | ICD-10-CM | POA: Diagnosis not present

## 2022-12-03 DIAGNOSIS — I4891 Unspecified atrial fibrillation: Secondary | ICD-10-CM | POA: Diagnosis not present

## 2022-12-03 DIAGNOSIS — R5382 Chronic fatigue, unspecified: Secondary | ICD-10-CM | POA: Diagnosis not present

## 2022-12-03 DIAGNOSIS — R609 Edema, unspecified: Secondary | ICD-10-CM | POA: Diagnosis not present

## 2022-12-03 DIAGNOSIS — E66812 Obesity, class 2: Secondary | ICD-10-CM | POA: Diagnosis not present

## 2022-12-03 DIAGNOSIS — I480 Paroxysmal atrial fibrillation: Secondary | ICD-10-CM | POA: Diagnosis not present

## 2022-12-31 ENCOUNTER — Other Ambulatory Visit: Payer: Self-pay | Admitting: Gastroenterology

## 2022-12-31 ENCOUNTER — Ambulatory Visit
Admission: RE | Admit: 2022-12-31 | Discharge: 2022-12-31 | Disposition: A | Discharge status: Home / Self Care | Payer: No Typology Code available for payment source | Source: Ambulatory Visit | Attending: Gastroenterology | Admitting: Gastroenterology

## 2022-12-31 DIAGNOSIS — R1032 Left lower quadrant pain: Secondary | ICD-10-CM

## 2022-12-31 DIAGNOSIS — K219 Gastro-esophageal reflux disease without esophagitis: Secondary | ICD-10-CM | POA: Diagnosis not present

## 2022-12-31 DIAGNOSIS — K5750 Diverticulosis of both small and large intestine without perforation or abscess without bleeding: Secondary | ICD-10-CM | POA: Diagnosis not present

## 2022-12-31 DIAGNOSIS — R110 Nausea: Secondary | ICD-10-CM | POA: Diagnosis not present

## 2022-12-31 DIAGNOSIS — R634 Abnormal weight loss: Secondary | ICD-10-CM | POA: Diagnosis not present

## 2022-12-31 DIAGNOSIS — N281 Cyst of kidney, acquired: Secondary | ICD-10-CM | POA: Diagnosis not present

## 2022-12-31 LAB — POCT I-STAT CREATININE: Creatinine, Ser: 1.1 mg/dL — ABNORMAL HIGH (ref 0.44–1.00)

## 2022-12-31 MED ORDER — IOHEXOL 300 MG/ML  SOLN
100.0000 mL | Freq: Once | INTRAMUSCULAR | Status: AC | PRN
  Administered 2022-12-31: 100 mL via INTRAVENOUS

## 2023-01-09 DIAGNOSIS — Z0100 Encounter for examination of eyes and vision without abnormal findings: Secondary | ICD-10-CM | POA: Diagnosis not present

## 2023-01-09 DIAGNOSIS — H524 Presbyopia: Secondary | ICD-10-CM | POA: Diagnosis not present

## 2023-01-28 DIAGNOSIS — E039 Hypothyroidism, unspecified: Secondary | ICD-10-CM | POA: Diagnosis not present

## 2023-01-28 DIAGNOSIS — R7303 Prediabetes: Secondary | ICD-10-CM | POA: Diagnosis not present

## 2023-01-28 DIAGNOSIS — I10 Essential (primary) hypertension: Secondary | ICD-10-CM | POA: Diagnosis not present

## 2023-02-03 DIAGNOSIS — K219 Gastro-esophageal reflux disease without esophagitis: Secondary | ICD-10-CM | POA: Diagnosis not present

## 2023-02-03 DIAGNOSIS — Z23 Encounter for immunization: Secondary | ICD-10-CM | POA: Diagnosis not present

## 2023-02-03 DIAGNOSIS — Z0001 Encounter for general adult medical examination with abnormal findings: Secondary | ICD-10-CM | POA: Diagnosis not present

## 2023-02-03 DIAGNOSIS — E039 Hypothyroidism, unspecified: Secondary | ICD-10-CM | POA: Diagnosis not present

## 2023-02-03 DIAGNOSIS — G4700 Insomnia, unspecified: Secondary | ICD-10-CM | POA: Diagnosis not present

## 2023-02-03 DIAGNOSIS — G894 Chronic pain syndrome: Secondary | ICD-10-CM | POA: Diagnosis not present

## 2023-02-03 DIAGNOSIS — R7303 Prediabetes: Secondary | ICD-10-CM | POA: Diagnosis not present

## 2023-02-03 DIAGNOSIS — Z86718 Personal history of other venous thrombosis and embolism: Secondary | ICD-10-CM | POA: Diagnosis not present

## 2023-02-03 DIAGNOSIS — E782 Mixed hyperlipidemia: Secondary | ICD-10-CM | POA: Diagnosis not present

## 2023-02-03 DIAGNOSIS — F13130 Sedative, hypnotic or anxiolytic abuse with withdrawal, uncomplicated: Secondary | ICD-10-CM | POA: Diagnosis not present

## 2023-02-03 DIAGNOSIS — E559 Vitamin D deficiency, unspecified: Secondary | ICD-10-CM | POA: Diagnosis not present

## 2023-05-02 DIAGNOSIS — E039 Hypothyroidism, unspecified: Secondary | ICD-10-CM | POA: Diagnosis not present

## 2023-05-02 DIAGNOSIS — I10 Essential (primary) hypertension: Secondary | ICD-10-CM | POA: Diagnosis not present

## 2023-05-02 DIAGNOSIS — R7303 Prediabetes: Secondary | ICD-10-CM | POA: Diagnosis not present

## 2023-05-05 DIAGNOSIS — K219 Gastro-esophageal reflux disease without esophagitis: Secondary | ICD-10-CM | POA: Diagnosis not present

## 2023-05-05 DIAGNOSIS — E039 Hypothyroidism, unspecified: Secondary | ICD-10-CM | POA: Diagnosis not present

## 2023-05-05 DIAGNOSIS — G4700 Insomnia, unspecified: Secondary | ICD-10-CM | POA: Diagnosis not present

## 2023-05-05 DIAGNOSIS — R7303 Prediabetes: Secondary | ICD-10-CM | POA: Diagnosis not present

## 2023-05-05 DIAGNOSIS — G894 Chronic pain syndrome: Secondary | ICD-10-CM | POA: Diagnosis not present

## 2023-05-05 DIAGNOSIS — I10 Essential (primary) hypertension: Secondary | ICD-10-CM | POA: Diagnosis not present

## 2023-05-05 DIAGNOSIS — E782 Mixed hyperlipidemia: Secondary | ICD-10-CM | POA: Diagnosis not present

## 2023-05-05 DIAGNOSIS — Z86718 Personal history of other venous thrombosis and embolism: Secondary | ICD-10-CM | POA: Diagnosis not present

## 2023-05-05 DIAGNOSIS — R944 Abnormal results of kidney function studies: Secondary | ICD-10-CM | POA: Diagnosis not present

## 2023-05-12 DIAGNOSIS — E890 Postprocedural hypothyroidism: Secondary | ICD-10-CM | POA: Diagnosis not present

## 2023-05-13 DIAGNOSIS — Z1211 Encounter for screening for malignant neoplasm of colon: Secondary | ICD-10-CM | POA: Diagnosis not present

## 2023-05-17 LAB — EXTERNAL GENERIC LAB PROCEDURE: COLOGUARD: NEGATIVE

## 2023-05-17 LAB — COLOGUARD: COLOGUARD: NEGATIVE

## 2023-05-19 DIAGNOSIS — R1084 Generalized abdominal pain: Secondary | ICD-10-CM | POA: Diagnosis not present

## 2023-05-19 DIAGNOSIS — Z23 Encounter for immunization: Secondary | ICD-10-CM | POA: Diagnosis not present

## 2023-05-19 DIAGNOSIS — Z0130 Encounter for examination of blood pressure without abnormal findings: Secondary | ICD-10-CM | POA: Diagnosis not present

## 2023-06-05 DIAGNOSIS — Z0131 Encounter for examination of blood pressure with abnormal findings: Secondary | ICD-10-CM | POA: Diagnosis not present

## 2023-06-05 DIAGNOSIS — M5489 Other dorsalgia: Secondary | ICD-10-CM | POA: Diagnosis not present

## 2023-06-23 DIAGNOSIS — R609 Edema, unspecified: Secondary | ICD-10-CM | POA: Diagnosis not present

## 2023-06-23 DIAGNOSIS — R5382 Chronic fatigue, unspecified: Secondary | ICD-10-CM | POA: Diagnosis not present

## 2023-06-23 DIAGNOSIS — I10 Essential (primary) hypertension: Secondary | ICD-10-CM | POA: Diagnosis not present

## 2023-06-23 DIAGNOSIS — I4891 Unspecified atrial fibrillation: Secondary | ICD-10-CM | POA: Diagnosis not present

## 2023-06-23 DIAGNOSIS — Z8673 Personal history of transient ischemic attack (TIA), and cerebral infarction without residual deficits: Secondary | ICD-10-CM | POA: Diagnosis not present

## 2023-06-23 DIAGNOSIS — R531 Weakness: Secondary | ICD-10-CM | POA: Diagnosis not present

## 2023-06-23 DIAGNOSIS — N1831 Chronic kidney disease, stage 3a: Secondary | ICD-10-CM | POA: Diagnosis not present

## 2023-06-23 DIAGNOSIS — R001 Bradycardia, unspecified: Secondary | ICD-10-CM | POA: Diagnosis not present

## 2023-06-26 DIAGNOSIS — N390 Urinary tract infection, site not specified: Secondary | ICD-10-CM | POA: Diagnosis not present

## 2023-07-01 ENCOUNTER — Ambulatory Visit: Admitting: Student in an Organized Health Care Education/Training Program

## 2023-07-10 DIAGNOSIS — E890 Postprocedural hypothyroidism: Secondary | ICD-10-CM | POA: Diagnosis not present

## 2023-07-10 DIAGNOSIS — R1032 Left lower quadrant pain: Secondary | ICD-10-CM | POA: Diagnosis not present

## 2023-07-10 DIAGNOSIS — K219 Gastro-esophageal reflux disease without esophagitis: Secondary | ICD-10-CM | POA: Diagnosis not present

## 2023-07-10 DIAGNOSIS — R1031 Right lower quadrant pain: Secondary | ICD-10-CM | POA: Diagnosis not present

## 2023-07-17 DIAGNOSIS — E890 Postprocedural hypothyroidism: Secondary | ICD-10-CM | POA: Diagnosis not present

## 2023-08-26 DIAGNOSIS — N390 Urinary tract infection, site not specified: Secondary | ICD-10-CM | POA: Diagnosis not present

## 2023-08-26 DIAGNOSIS — Z131 Encounter for screening for diabetes mellitus: Secondary | ICD-10-CM | POA: Diagnosis not present

## 2023-08-26 DIAGNOSIS — Z13220 Encounter for screening for lipoid disorders: Secondary | ICD-10-CM | POA: Diagnosis not present

## 2023-08-26 DIAGNOSIS — E785 Hyperlipidemia, unspecified: Secondary | ICD-10-CM | POA: Diagnosis not present

## 2023-09-16 ENCOUNTER — Ambulatory Visit: Admitting: Psychiatry

## 2023-09-18 ENCOUNTER — Ambulatory Visit: Admitting: Psychiatry

## 2023-10-09 DIAGNOSIS — N390 Urinary tract infection, site not specified: Secondary | ICD-10-CM | POA: Diagnosis not present

## 2023-12-12 DIAGNOSIS — I10 Essential (primary) hypertension: Secondary | ICD-10-CM | POA: Diagnosis not present

## 2023-12-12 DIAGNOSIS — R531 Weakness: Secondary | ICD-10-CM | POA: Diagnosis not present

## 2023-12-12 DIAGNOSIS — R609 Edema, unspecified: Secondary | ICD-10-CM | POA: Diagnosis not present

## 2023-12-12 DIAGNOSIS — R001 Bradycardia, unspecified: Secondary | ICD-10-CM | POA: Diagnosis not present

## 2023-12-12 DIAGNOSIS — N1831 Chronic kidney disease, stage 3a: Secondary | ICD-10-CM | POA: Diagnosis not present

## 2023-12-12 DIAGNOSIS — Z8679 Personal history of other diseases of the circulatory system: Secondary | ICD-10-CM | POA: Diagnosis not present

## 2023-12-12 DIAGNOSIS — Z8673 Personal history of transient ischemic attack (TIA), and cerebral infarction without residual deficits: Secondary | ICD-10-CM | POA: Diagnosis not present

## 2023-12-12 DIAGNOSIS — I4891 Unspecified atrial fibrillation: Secondary | ICD-10-CM | POA: Diagnosis not present
